# Patient Record
Sex: Female | Born: 1940 | Race: White | Hispanic: No | Smoking: Current every day smoker
Health system: Southern US, Community
[De-identification: ages and names within clinical notes are randomized; demographics above are authoritative.]

## PROBLEM LIST (undated history)

## (undated) DIAGNOSIS — F17200 Nicotine dependence, unspecified, uncomplicated: Secondary | ICD-10-CM

## (undated) DIAGNOSIS — R112 Nausea with vomiting, unspecified: Secondary | ICD-10-CM

## (undated) DIAGNOSIS — Z87442 Personal history of urinary calculi: Secondary | ICD-10-CM

## (undated) DIAGNOSIS — I779 Disorder of arteries and arterioles, unspecified: Secondary | ICD-10-CM

## (undated) DIAGNOSIS — R490 Dysphonia: Secondary | ICD-10-CM

## (undated) DIAGNOSIS — I739 Peripheral vascular disease, unspecified: Secondary | ICD-10-CM

## (undated) DIAGNOSIS — IMO0002 Reserved for concepts with insufficient information to code with codable children: Secondary | ICD-10-CM

## (undated) DIAGNOSIS — J449 Chronic obstructive pulmonary disease, unspecified: Secondary | ICD-10-CM

## (undated) DIAGNOSIS — M8580 Other specified disorders of bone density and structure, unspecified site: Secondary | ICD-10-CM

## (undated) DIAGNOSIS — K859 Acute pancreatitis without necrosis or infection, unspecified: Secondary | ICD-10-CM

## (undated) DIAGNOSIS — R32 Unspecified urinary incontinence: Secondary | ICD-10-CM

## (undated) DIAGNOSIS — R079 Chest pain, unspecified: Secondary | ICD-10-CM

## (undated) DIAGNOSIS — R109 Unspecified abdominal pain: Secondary | ICD-10-CM

## (undated) DIAGNOSIS — I10 Essential (primary) hypertension: Secondary | ICD-10-CM

## (undated) DIAGNOSIS — F32A Depression, unspecified: Secondary | ICD-10-CM

## (undated) DIAGNOSIS — J309 Allergic rhinitis, unspecified: Secondary | ICD-10-CM

## (undated) DIAGNOSIS — E7800 Pure hypercholesterolemia, unspecified: Secondary | ICD-10-CM

## (undated) DIAGNOSIS — K219 Gastro-esophageal reflux disease without esophagitis: Secondary | ICD-10-CM

## (undated) DIAGNOSIS — M1990 Unspecified osteoarthritis, unspecified site: Secondary | ICD-10-CM

## (undated) DIAGNOSIS — Z9889 Other specified postprocedural states: Secondary | ICD-10-CM

## (undated) DIAGNOSIS — I2510 Atherosclerotic heart disease of native coronary artery without angina pectoris: Secondary | ICD-10-CM

## (undated) DIAGNOSIS — G709 Myoneural disorder, unspecified: Secondary | ICD-10-CM

## (undated) DIAGNOSIS — R011 Cardiac murmur, unspecified: Secondary | ICD-10-CM

## (undated) DIAGNOSIS — H269 Unspecified cataract: Secondary | ICD-10-CM

## (undated) DIAGNOSIS — R0600 Dyspnea, unspecified: Secondary | ICD-10-CM

## (undated) DIAGNOSIS — E559 Vitamin D deficiency, unspecified: Secondary | ICD-10-CM

## (undated) DIAGNOSIS — F320 Major depressive disorder, single episode, mild: Secondary | ICD-10-CM

## (undated) HISTORY — DX: Other specified disorders of bone density and structure, unspecified site: M85.80

## (undated) HISTORY — DX: Acute pancreatitis without necrosis or infection, unspecified: K85.90

## (undated) HISTORY — PX: TONSILLECTOMY: SUR1361

## (undated) HISTORY — PX: OTHER SURGICAL HISTORY: SHX169

## (undated) HISTORY — PX: LIPOSUCTION: SHX10

## (undated) HISTORY — PX: SHOULDER SURGERY: SHX246

## (undated) HISTORY — DX: Unspecified cataract: H26.9

## (undated) HISTORY — DX: Vitamin D deficiency, unspecified: E55.9

## (undated) HISTORY — PX: BREAST BIOPSY: SHX20

## (undated) HISTORY — DX: Dysphonia: R49.0

## (undated) HISTORY — DX: Personal history of urinary calculi: Z87.442

## (undated) HISTORY — DX: Chronic obstructive pulmonary disease, unspecified: J44.9

## (undated) HISTORY — PX: TUBAL LIGATION: SHX77

## (undated) HISTORY — DX: Reserved for concepts with insufficient information to code with codable children: IMO0002

## (undated) HISTORY — DX: Essential (primary) hypertension: I10

## (undated) HISTORY — PX: COLON SURGERY: SHX602

## (undated) HISTORY — DX: Major depressive disorder, single episode, mild: F32.0

## (undated) HISTORY — PX: APPENDECTOMY: SHX54

## (undated) HISTORY — DX: Disorder of arteries and arterioles, unspecified: I77.9

## (undated) HISTORY — DX: Unspecified osteoarthritis, unspecified site: M19.90

## (undated) HISTORY — DX: Gastro-esophageal reflux disease without esophagitis: K21.9

## (undated) HISTORY — DX: Nicotine dependence, unspecified, uncomplicated: F17.200

## (undated) HISTORY — DX: Peripheral vascular disease, unspecified: I73.9

## (undated) HISTORY — DX: Pure hypercholesterolemia, unspecified: E78.00

## (undated) HISTORY — DX: Allergic rhinitis, unspecified: J30.9

## (undated) HISTORY — PX: DILATION AND CURETTAGE OF UTERUS: SHX78

## (undated) HISTORY — DX: Unspecified urinary incontinence: R32

## (undated) HISTORY — DX: Chest pain, unspecified: R07.9

## (undated) HISTORY — DX: Depression, unspecified: F32.A

## (undated) HISTORY — DX: Unspecified abdominal pain: R10.9

## (undated) HISTORY — PX: CATARACT EXTRACTION, BILATERAL: SHX1313

## (undated) HISTORY — PX: COSMETIC SURGERY: SHX468

## (undated) HISTORY — PX: EYE SURGERY: SHX253

## (undated) HISTORY — PX: FRACTURE SURGERY: SHX138

---

## 1998-10-22 ENCOUNTER — Ambulatory Visit (HOSPITAL_BASED_OUTPATIENT_CLINIC_OR_DEPARTMENT_OTHER): Admission: RE | Admit: 1998-10-22 | Discharge: 1998-10-22 | Payer: Self-pay | Admitting: Orthopedic Surgery

## 1999-01-23 ENCOUNTER — Other Ambulatory Visit: Admission: RE | Admit: 1999-01-23 | Discharge: 1999-01-23 | Payer: Self-pay | Admitting: Obstetrics and Gynecology

## 1999-03-25 ENCOUNTER — Ambulatory Visit (HOSPITAL_BASED_OUTPATIENT_CLINIC_OR_DEPARTMENT_OTHER): Admission: RE | Admit: 1999-03-25 | Discharge: 1999-03-25 | Payer: Self-pay | Admitting: General Surgery

## 2000-02-05 ENCOUNTER — Other Ambulatory Visit: Admission: RE | Admit: 2000-02-05 | Discharge: 2000-02-05 | Payer: Self-pay | Admitting: Obstetrics and Gynecology

## 2000-04-30 ENCOUNTER — Ambulatory Visit (HOSPITAL_BASED_OUTPATIENT_CLINIC_OR_DEPARTMENT_OTHER): Admission: RE | Admit: 2000-04-30 | Discharge: 2000-04-30 | Payer: Self-pay | Admitting: Orthopedic Surgery

## 2000-10-09 ENCOUNTER — Encounter: Payer: Self-pay | Admitting: Internal Medicine

## 2000-10-09 ENCOUNTER — Inpatient Hospital Stay (HOSPITAL_COMMUNITY): Admission: EM | Admit: 2000-10-09 | Discharge: 2000-10-14 | Payer: Self-pay | Admitting: Emergency Medicine

## 2001-02-09 ENCOUNTER — Other Ambulatory Visit: Admission: RE | Admit: 2001-02-09 | Discharge: 2001-02-09 | Payer: Self-pay | Admitting: Obstetrics and Gynecology

## 2001-03-30 ENCOUNTER — Ambulatory Visit (HOSPITAL_BASED_OUTPATIENT_CLINIC_OR_DEPARTMENT_OTHER): Admission: RE | Admit: 2001-03-30 | Discharge: 2001-03-30 | Payer: Self-pay | Admitting: Orthopedic Surgery

## 2001-10-05 ENCOUNTER — Ambulatory Visit (HOSPITAL_COMMUNITY): Admission: RE | Admit: 2001-10-05 | Discharge: 2001-10-05 | Payer: Self-pay | Admitting: Internal Medicine

## 2002-03-29 ENCOUNTER — Other Ambulatory Visit: Admission: RE | Admit: 2002-03-29 | Discharge: 2002-03-29 | Payer: Self-pay | Admitting: Internal Medicine

## 2002-11-03 DIAGNOSIS — R079 Chest pain, unspecified: Secondary | ICD-10-CM

## 2002-11-03 HISTORY — DX: Chest pain, unspecified: R07.9

## 2002-12-15 ENCOUNTER — Ambulatory Visit (HOSPITAL_COMMUNITY): Admission: RE | Admit: 2002-12-15 | Discharge: 2002-12-15 | Payer: Self-pay | Admitting: Internal Medicine

## 2005-01-23 ENCOUNTER — Ambulatory Visit (HOSPITAL_COMMUNITY): Admission: RE | Admit: 2005-01-23 | Discharge: 2005-01-23 | Payer: Self-pay | Admitting: Allergy

## 2005-02-06 ENCOUNTER — Other Ambulatory Visit: Admission: RE | Admit: 2005-02-06 | Discharge: 2005-02-06 | Payer: Self-pay | Admitting: Internal Medicine

## 2006-02-16 ENCOUNTER — Other Ambulatory Visit: Admission: RE | Admit: 2006-02-16 | Discharge: 2006-02-16 | Payer: Self-pay | Admitting: Internal Medicine

## 2007-12-05 HISTORY — PX: FEMUR SURGERY: SHX943

## 2009-03-01 ENCOUNTER — Other Ambulatory Visit: Admission: RE | Admit: 2009-03-01 | Discharge: 2009-03-01 | Payer: Self-pay | Admitting: Internal Medicine

## 2009-03-19 ENCOUNTER — Ambulatory Visit: Payer: Self-pay | Admitting: Vascular Surgery

## 2009-03-19 ENCOUNTER — Ambulatory Visit (HOSPITAL_COMMUNITY): Admission: RE | Admit: 2009-03-19 | Discharge: 2009-03-19 | Payer: Self-pay | Admitting: Internal Medicine

## 2009-03-19 ENCOUNTER — Encounter (INDEPENDENT_AMBULATORY_CARE_PROVIDER_SITE_OTHER): Payer: Self-pay | Admitting: Internal Medicine

## 2011-03-21 NOTE — Op Note (Signed)
[place]  Patient:    Elizabeth [name], Elizabeth [name]                         MRN: [id number] Proc. Date: 04/30/00 Adm. Date:  [id number] Disc. Date: [id number] Attending:  Ronne Binning                           Operative Report  PREOPERATIVE DIAGNOSIS:  Recurrent cyst of the right wrist.  POSTOPERATIVE DIAGNOSIS:  Recurrent cyst of the right wrist.  OPERATION:  Excision of cyst of the right wrist.  SURGEON:  Nicki Reaper, M.D.  ANESTHESIA:  Axillary block.  ANESTHESIOLOGIST:  Smith.  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  PROCEDURE:  The patient was brought to the operating room where an axillary block was carried out without difficulty.  She was prepped and draped using Betadine scrubbing solution with the right arm free.  The old incision was used and carried proximally and distally.  It was carried down through the subcutaneous tissue.  Bleeders were electrocauterized.  The radial artery was identified. The cyst was immediately encountered and with blunt and sharp dissection this was dissected free.  A small portion was left adherent to the artery. The remainder was sent to pathology.  The stalk was then followed into the radial carpal joint.  The area was opened, debrided, irrigated and the capsule closed with figure-of-eight 4-0 Vicryl sutures.  The subcutaneous tissue was closed with interrupted 4-0 Vicryl and the skin with interrupted 5-0 Nylon sutures.  A sterile compressive dressing and splint was applied. The patient tolerated the procedure well and was taken to the recovery room for observation in satisfactory condition.  She is discharged home to return to the office in [place] in 1 week on Vicodin and Keflex. DD:  04/30/00 TD:  05/01/00 Job: [id number]

## 2011-04-19 ENCOUNTER — Emergency Department (HOSPITAL_COMMUNITY): Payer: Medicare Other

## 2011-04-19 ENCOUNTER — Inpatient Hospital Stay (HOSPITAL_COMMUNITY)
Admission: EM | Admit: 2011-04-19 | Discharge: 2011-04-23 | DRG: 493 | Disposition: A | Discharge status: Rehabilitation Facility | Payer: Medicare Other | Attending: Orthopedic Surgery | Admitting: Orthopedic Surgery

## 2011-04-19 DIAGNOSIS — Z7901 Long term (current) use of anticoagulants: Secondary | ICD-10-CM

## 2011-04-19 DIAGNOSIS — S82899A Other fracture of unspecified lower leg, initial encounter for closed fracture: Principal | ICD-10-CM | POA: Diagnosis present

## 2011-04-19 DIAGNOSIS — W11XXXA Fall on and from ladder, initial encounter: Secondary | ICD-10-CM | POA: Diagnosis present

## 2011-04-19 DIAGNOSIS — I10 Essential (primary) hypertension: Secondary | ICD-10-CM | POA: Diagnosis present

## 2011-04-19 DIAGNOSIS — F17200 Nicotine dependence, unspecified, uncomplicated: Secondary | ICD-10-CM | POA: Diagnosis present

## 2011-04-19 DIAGNOSIS — S52599A Other fractures of lower end of unspecified radius, initial encounter for closed fracture: Secondary | ICD-10-CM | POA: Diagnosis present

## 2011-04-19 DIAGNOSIS — K219 Gastro-esophageal reflux disease without esophagitis: Secondary | ICD-10-CM | POA: Diagnosis present

## 2011-04-19 DIAGNOSIS — M899 Disorder of bone, unspecified: Secondary | ICD-10-CM | POA: Diagnosis present

## 2011-04-19 LAB — POCT I-STAT, CHEM 8
BUN: 22 mg/dL (ref 6–23)
Calcium, Ion: 1.18 mmol/L (ref 1.12–1.32)
Chloride: 101 meq/L (ref 96–112)
Creatinine, Ser: 1.2 mg/dL — ABNORMAL HIGH (ref 0.50–1.10)
Glucose, Bld: 99 mg/dL (ref 70–99)
HCT: 37 % (ref 36.0–46.0)
Hemoglobin: 12.6 g/dL (ref 12.0–15.0)
Potassium: 3.2 meq/L — ABNORMAL LOW (ref 3.5–5.1)
Sodium: 136 meq/L (ref 135–145)
TCO2: 25 mmol/L (ref 0–100)

## 2011-04-20 ENCOUNTER — Emergency Department (HOSPITAL_COMMUNITY): Payer: Medicare Other

## 2011-04-20 LAB — CBC
HCT: 32.4 % — ABNORMAL LOW (ref 36.0–46.0)
Hemoglobin: 10.9 g/dL — ABNORMAL LOW (ref 12.0–15.0)
MCH: 31.1 pg (ref 26.0–34.0)
MCHC: 33.6 g/dL (ref 30.0–36.0)
MCV: 92.3 fL (ref 78.0–100.0)
Platelets: 161 10*3/uL (ref 150–400)
RBC: 3.51 MIL/uL — ABNORMAL LOW (ref 3.87–5.11)
RDW: 12.8 % (ref 11.5–15.5)
WBC: 12.7 10*3/uL — ABNORMAL HIGH (ref 4.0–10.5)

## 2011-04-20 LAB — PROTIME-INR
INR: 1 (ref 0.00–1.49)
Prothrombin Time: 13.4 seconds (ref 11.6–15.2)

## 2011-04-20 LAB — BASIC METABOLIC PANEL
BUN: 15 mg/dL (ref 6–23)
CO2: 27 mEq/L (ref 19–32)
Calcium: 8.3 mg/dL — ABNORMAL LOW (ref 8.4–10.5)
Chloride: 103 mEq/L (ref 96–112)
Creatinine, Ser: 0.78 mg/dL (ref 0.50–1.10)
GFR calc Af Amer: >60 mL/min (ref >60)
GFR calc non Af Amer: >60 mL/min (ref >60)
Glucose, Bld: 120 mg/dL — ABNORMAL HIGH (ref 70–99)
Potassium: 3.2 mEq/L — ABNORMAL LOW (ref 3.5–5.1)
Sodium: 139 mEq/L (ref 135–145)

## 2011-04-21 LAB — BASIC METABOLIC PANEL
BUN: 6 mg/dL (ref 6–23)
CO2: 28 mEq/L (ref 19–32)
Calcium: 8.2 mg/dL — ABNORMAL LOW (ref 8.4–10.5)
Chloride: 103 mEq/L (ref 96–112)
Creatinine, Ser: 0.56 mg/dL (ref 0.50–1.10)
GFR calc Af Amer: >60 mL/min (ref >60)
GFR calc non Af Amer: >60 mL/min (ref >60)
Glucose, Bld: 131 mg/dL — ABNORMAL HIGH (ref 70–99)
Potassium: 3.5 mEq/L (ref 3.5–5.1)
Sodium: 138 mEq/L (ref 135–145)

## 2011-04-21 LAB — CBC
HCT: 32 % — ABNORMAL LOW (ref 36.0–46.0)
Hemoglobin: 10.8 g/dL — ABNORMAL LOW (ref 12.0–15.0)
MCH: 31.2 pg (ref 26.0–34.0)
MCHC: 33.8 g/dL (ref 30.0–36.0)
MCV: 92.5 fL (ref 78.0–100.0)
Platelets: 137 10*3/uL — ABNORMAL LOW (ref 150–400)
RBC: 3.46 MIL/uL — ABNORMAL LOW (ref 3.87–5.11)
RDW: 12.7 % (ref 11.5–15.5)
WBC: 11.6 10*3/uL — ABNORMAL HIGH (ref 4.0–10.5)

## 2011-04-21 LAB — PROTIME-INR
INR: 1.23 (ref 0.00–1.49)
Prothrombin Time: 15.8 seconds — ABNORMAL HIGH (ref 11.6–15.2)

## 2011-04-21 NOTE — Op Note (Signed)
Elizabeth [name], Elizabeth [name] NO.:  [id number]  MEDICAL RECORD NO.:  [id number]  LOCATION:  5010                         FACILITY:  [facility]  PHYSICIAN:  Eulas Post, MD    DATE OF BIRTH:  21-Jun-1941  DATE OF PROCEDURE:  04/19/2011 DATE OF DISCHARGE:                              OPERATIVE REPORT   ATTENDING SURGEON:  Eulas Post, MD  FIRST ASSISTANT:  None.  PREOPERATIVE DIAGNOSIS:  Left distal radius fracture and left tibia fracture.  POSTOPERATIVE DIAGNOSIS:  Left distal radius fracture and left tibia fracture.  OPERATIVE PROCEDURE:  Open reduction and internal fixation of left distal radius fracture with volar plating, 2 pieces, and intramedullary nailing of left tibia.  OPERATIVE IMPLANTS:  DVR volar plate for the distal radius with the smooth locking pegs in the distal holes, with a narrow head, short plate, and a size 10 DePuy tibial nail with 2 interlocking screws proximally locked static and 2 distal interlocking screws.  PREOPERATIVE INDICATIONS:  Ms. Elizabeth [name] is a 70 year old woman who has had a previous history of a proximal humerus fracture and ankle fracture as well as a hip fracture before with some osteopenia who fell off a ladder approximately 6-7 feet.  She had the above-named injuries.  She elected for plate fixation of the distal radius and intramedullary nail fixation of the tibia.  The risks, benefits, and alternatives were discussed with her before the procedure including but not limited to nonoperative management options, versus surgical intervention, with the risks of infection, bleeding, nerve injury, malunion, nonunion, hardware prominence, hardware failure, need for hardware removal, tendon rupture, median nerve injury, regional pain syndrome, stiffness, loss of function, hardware prominence, the need for hardware removal, cardiopulmonary complications, among others and she is willing to proceed.  OPERATIVE PROCEDURE:  The  patient was brought to the operating room and placed in supine position.  IV antibiotics were given.  General anesthesia was administered.  I started with the distal radius.  The left upper extremity was prepped and draped in the usual sterile fashion.  The arm was elevated and exsanguinated and the tourniquet was inflated.  Total tourniquet time was 1 hour and 15 minutes.  Volar approach to the distal radius was performed.  The flexor carpi radialis was retracted and the radial artery was protected as was the median nerve.  Once I had exposed the fracture site, I reduced it anatomically and held provisionally and placed a K-wire.  Once the K-wire was in place and I confirmed reduction, I then applied the plate and secured it initially with K-wires and then once I was satisfied with position of the plate I then completed the fixation with a proximal screw.  The proximal screws were all left shy of extending through the other cortex in order to minimize tendon irritation. Additionally, all of the smooth pegs were left unicortical and on the fracture side.  I had excellent fixation in the distal segment and there were no screws penetrating into the joint.  Once all the hardware was in and I was satisfied that I had appropriately reduced inclination, height, tilt, as well as the sigmoid notch, I then  irrigated the wounds copiously.  The pronator quadratus was fairly poor quality, and so I did not get a very good repair on that.  After I irrigated it copiously, I repaired the subcutaneous tissue with Vicryl followed by Monocryl and Steri-Strips for the skin.  I dressed with sterile gauze and then I turned my attention to the tibia.  A completely separate sterile prep and drape was performed for the tibia.  A medial parapatellar incision was made and I dissected down through the capsule and then introduced a guide pin.  I was actually having fairly difficult time getting lateral enough, and  the guide pin kept trying to move medially, and so ultimately I ended up using an awl, with little better control and then opened the proximal tibia.  I confirmed on AP and lateral views the appropriate opening and then introduced a guide pin down.  I reduced the fracture anatomically and confirmed it on AP and lateral views.  Once I had the guide pin across, I then sequentially reamed up to an 11.5 for the 10 nail.  This was an appropriate size.  Overall bone quality was fairly weak, so I did not want to [facility] or potentially provide any the cortices.  I had reasonable chatter.  I measured the appropriate length of the nail and then placed the nail without difficulty.  Anatomic reduction was achieved and I had confirmed rotational alignment as well clinically. The fracture actually keyed in quite nicely.  I then performed a single static interlock proximally and a second oblique interlock proximally.  The oblique screw was probably not completely bicortical.  I stopped short as it was right near the fibular head.  I did perfect circles and interlocked the distal tibia with 2 screws. This provided excellent fixation.  Confirmation was made on AP and lateral views of all the hardware as well as the reduction and positioning.  I then irrigated all the wounds copiously and closed the parapatellar tissue with #1 Vicryl followed by 3-0 for subcutaneous tissue and Monocryl with Steri-Strips and sterile gauze for the skin.  She was placed into a posterior splint on both her leg as well as a volar splint on her wrist.  Tourniquet time on the tibia was 1 hour and 45 minutes. She tolerated the procedure well.  There were no complications.     Eulas Post, MD     JPL/MEDQ  D:  04/20/2011  T:  04/20/2011  Job:  [id number]  Electronically Signed by Teryl Lucy MD on 04/21/2011 05:03:03 PM

## 2011-04-21 NOTE — H&P (Signed)
NAMEISOLA, MEHLMAN NO.:  [id number]  MEDICAL RECORD NO.:  [id number]  LOCATION:  [facility]                         FACILITY:  [facility]  PHYSICIAN:  Eulas Post, MD    DATE OF BIRTH:  Apr 12, 1941  DATE OF ADMISSION:  04/19/2011 DATE OF DISCHARGE:                             HISTORY & PHYSICAL   CHIEF COMPLAINT:  Fall.  HISTORY:  Ms. Minerva Bluett is a 70 year old woman who is right-hand dominant who fell 7 feet off a ladder today while she was trying to trim some trees and had acute onset severe left wrist and left leg pain.  The pain is rated as moderate to severe, and is located directly over the wrist and the leg.  She was unable to walk.  She was brought into the emergency room for evaluation.  Moving it makes it worse, and resting it makes it better.  She describes it as a dull pain.  She did not have any loss of consciousness.  This was a mechanical fall.  PAST HISTORY:  Significant for a fall a couple of years ago which resulted in a left hip fracture and also a left shoulder fracture.  She apparently had borderline osteopenia.  She also has hypertension.  FAMILY HISTORY:  Her father died in World War II and her mother died with cardiac disease, which runs in her family.  SOCIAL HISTORY:  She says that she occasionally smokes depending on the stress level, and she lives in [place] with a house here in [place] as well.  REVIEW OF SYSTEMS:  Complete review of systems was performed and was otherwise negative with the exception of those mentioned in the history of present illness.  PHYSICAL EXAMINATION:  CONSTITUTIONAL:  She is alert and oriented x3 and appropriate with me throughout my interaction and is well developed, well nourished in no acute distress. EYE:  Extraocular movements are intact. LYMPHATIC:  She has no axillary or cervical lymphadenopathy. CARDIOVASCULAR:  She has no pedal edema. GI:  Her abdomen is soft and nontender without any  rebound or guarding or organomegaly. PSYCHIATRIC:  She is appropriate for consent and is interacting appropriately. SKIN:  She has bruising both over her left wrist and her left leg, but no skin breaks and no evidence for open fracture. NEUROLOGIC:  Sensation is intact throughout the left hand, particularly in the median nerve there are no deficits.  The sensation is also intact throughout the left leg.  All fingers flex, extend and abduct although she does this weakly due to pain on the left side.  Her EHL and FHL are firing on the left side as well. MUSCULOSKELETAL:  She has point tenderness as well as gross deformity of the left wrist.  The elbow and shoulder and right upper extremity and right lower extremity are atraumatic.  Her left lower extremity has pain to palpation with mild deformity over the tibia.  She has a well-healed surgical scar over her left hip.  Her compartments are soft.  IMAGING:  X-rays demonstrate a grossly displaced left distal radius fracture and also a mildly displaced comminuted distal third tibia fracture.  IMPRESSION:  Acute left  distal third tibia fracture, and left displaced distal radius fracture.  Please also add that there is evidence for previous distal fibula fracture which appears to have healed with some possible slight malunion.  PLAN:  This is an acute severe injury, and is going to have significant impact on her ambulatory function.  She is a high-risk both for morbidity as well as potentially for mortality, given the constellation of injuries, as well as her age.  I have discussed the risks, benefits and alternatives to surgical versus nonsurgical intervention for both the wrist as well as the tibia, and given her age and desired activity level, I think that closed management of either of these fractures would be significantly impairing, and lead to a lesser result.  Therefore in order to accelerate mobilization, and optimize return  to function, I have recommended open reduction and internal fixation of left distal radius as well as intramedullary nailing of the left tibia.  The risks, benefits and alternatives were discussed at length and we are going to plan to proceed accordingly, probably later this evening.     Eulas Post, MD     JPL/MEDQ  D:  04/19/2011  T:  04/20/2011  Job:  [id number]  Electronically Signed by Teryl Lucy MD on 04/21/2011 05:03:01 PM

## 2011-04-22 LAB — PROTIME-INR
INR: 1.61 — ABNORMAL HIGH (ref 0.00–1.49)
Prothrombin Time: 19.4 seconds — ABNORMAL HIGH (ref 11.6–15.2)

## 2011-04-23 ENCOUNTER — Inpatient Hospital Stay (HOSPITAL_COMMUNITY)
Admission: RE | Admit: 2011-04-23 | Discharge: 2011-04-28 | DRG: 945 | Disposition: A | Discharge status: Home Health | Payer: Medicare Other | Source: Other Acute Inpatient Hospital | Attending: Physical Medicine & Rehabilitation | Admitting: Physical Medicine & Rehabilitation

## 2011-04-23 DIAGNOSIS — S52509A Unspecified fracture of the lower end of unspecified radius, initial encounter for closed fracture: Secondary | ICD-10-CM

## 2011-04-23 DIAGNOSIS — E785 Hyperlipidemia, unspecified: Secondary | ICD-10-CM | POA: Diagnosis present

## 2011-04-23 DIAGNOSIS — Z5189 Encounter for other specified aftercare: Principal | ICD-10-CM

## 2011-04-23 DIAGNOSIS — D62 Acute posthemorrhagic anemia: Secondary | ICD-10-CM

## 2011-04-23 DIAGNOSIS — S52599A Other fractures of lower end of unspecified radius, initial encounter for closed fracture: Secondary | ICD-10-CM | POA: Diagnosis present

## 2011-04-23 DIAGNOSIS — K5900 Constipation, unspecified: Secondary | ICD-10-CM | POA: Diagnosis not present

## 2011-04-23 DIAGNOSIS — S82899A Other fracture of unspecified lower leg, initial encounter for closed fracture: Secondary | ICD-10-CM | POA: Diagnosis present

## 2011-04-23 DIAGNOSIS — K219 Gastro-esophageal reflux disease without esophagitis: Secondary | ICD-10-CM | POA: Diagnosis present

## 2011-04-23 DIAGNOSIS — S82209A Unspecified fracture of shaft of unspecified tibia, initial encounter for closed fracture: Secondary | ICD-10-CM

## 2011-04-23 DIAGNOSIS — W11XXXA Fall on and from ladder, initial encounter: Secondary | ICD-10-CM | POA: Diagnosis present

## 2011-04-23 DIAGNOSIS — I10 Essential (primary) hypertension: Secondary | ICD-10-CM | POA: Diagnosis present

## 2011-04-23 DIAGNOSIS — S52609A Unspecified fracture of lower end of unspecified ulna, initial encounter for closed fracture: Secondary | ICD-10-CM

## 2011-04-23 LAB — PROTIME-INR
INR: 1.85 — ABNORMAL HIGH (ref 0.00–1.49)
Prothrombin Time: 21.7 seconds — ABNORMAL HIGH (ref 11.6–15.2)

## 2011-04-24 DIAGNOSIS — Z5189 Encounter for other specified aftercare: Secondary | ICD-10-CM

## 2011-04-24 DIAGNOSIS — S52609A Unspecified fracture of lower end of unspecified ulna, initial encounter for closed fracture: Secondary | ICD-10-CM

## 2011-04-24 DIAGNOSIS — D62 Acute posthemorrhagic anemia: Secondary | ICD-10-CM

## 2011-04-24 DIAGNOSIS — S82209A Unspecified fracture of shaft of unspecified tibia, initial encounter for closed fracture: Secondary | ICD-10-CM

## 2011-04-24 DIAGNOSIS — S52509A Unspecified fracture of the lower end of unspecified radius, initial encounter for closed fracture: Secondary | ICD-10-CM

## 2011-04-24 LAB — CBC
HCT: 29.3 % — ABNORMAL LOW (ref 36.0–46.0)
Hemoglobin: 10 g/dL — ABNORMAL LOW (ref 12.0–15.0)
MCH: 31.1 pg (ref 26.0–34.0)
MCHC: 34.1 g/dL (ref 30.0–36.0)
MCV: 91 fL (ref 78.0–100.0)
Platelets: 208 10*3/uL (ref 150–400)
RBC: 3.22 MIL/uL — ABNORMAL LOW (ref 3.87–5.11)
RDW: 12.5 % (ref 11.5–15.5)
WBC: 6.3 10*3/uL (ref 4.0–10.5)

## 2011-04-24 LAB — DIFFERENTIAL
Basophils Absolute: 0 10*3/uL (ref 0.0–0.1)
Basophils Relative: 0 % (ref 0–1)
Eosinophils Absolute: 0 10*3/uL (ref 0.0–0.7)
Eosinophils Relative: 0 % (ref 0–5)
Lymphocytes Relative: 24 % (ref 12–46)
Lymphs Abs: 1.5 10*3/uL (ref 0.7–4.0)
Monocytes Absolute: 0.3 10*3/uL (ref 0.1–1.0)
Monocytes Relative: 5 % (ref 3–12)
Neutro Abs: 4.5 10*3/uL (ref 1.7–7.7)
Neutrophils Relative %: 71 % (ref 43–77)

## 2011-04-24 LAB — COMPREHENSIVE METABOLIC PANEL
ALT: 12 U/L (ref 0–35)
AST: 21 U/L (ref 0–37)
Albumin: 2.6 g/dL — ABNORMAL LOW (ref 3.5–5.2)
Alkaline Phosphatase: 64 U/L (ref 39–117)
BUN: 15 mg/dL (ref 6–23)
CO2: 27 mEq/L (ref 19–32)
Calcium: 9 mg/dL (ref 8.4–10.5)
Chloride: 101 mEq/L (ref 96–112)
Creatinine, Ser: 0.63 mg/dL (ref 0.50–1.10)
GFR calc Af Amer: >60 mL/min (ref >60)
GFR calc non Af Amer: >60 mL/min (ref >60)
Glucose, Bld: 120 mg/dL — ABNORMAL HIGH (ref 70–99)
Potassium: 3.7 mEq/L (ref 3.5–5.1)
Sodium: 140 mEq/L (ref 135–145)
Total Bilirubin: 0.5 mg/dL (ref 0.3–1.2)
Total Protein: 6.5 g/dL (ref 6.0–8.3)

## 2011-04-24 LAB — PROTIME-INR
INR: 2.15 — ABNORMAL HIGH (ref 0.00–1.49)
Prothrombin Time: 24.4 seconds — ABNORMAL HIGH (ref 11.6–15.2)

## 2011-04-25 DIAGNOSIS — S52609A Unspecified fracture of lower end of unspecified ulna, initial encounter for closed fracture: Secondary | ICD-10-CM

## 2011-04-25 DIAGNOSIS — D62 Acute posthemorrhagic anemia: Secondary | ICD-10-CM

## 2011-04-25 DIAGNOSIS — S52509A Unspecified fracture of the lower end of unspecified radius, initial encounter for closed fracture: Secondary | ICD-10-CM

## 2011-04-25 DIAGNOSIS — S82209A Unspecified fracture of shaft of unspecified tibia, initial encounter for closed fracture: Secondary | ICD-10-CM

## 2011-04-25 DIAGNOSIS — Z5189 Encounter for other specified aftercare: Secondary | ICD-10-CM

## 2011-04-25 LAB — PROTIME-INR
INR: 2.44 — ABNORMAL HIGH (ref 0.00–1.49)
Prothrombin Time: 26.9 seconds — ABNORMAL HIGH (ref 11.6–15.2)

## 2011-04-26 LAB — PROTIME-INR
INR: 3.18 — ABNORMAL HIGH (ref 0.00–1.49)
Prothrombin Time: 33.1 seconds — ABNORMAL HIGH (ref 11.6–15.2)

## 2011-04-27 LAB — PROTIME-INR
INR: 2.1 — ABNORMAL HIGH (ref 0.00–1.49)
Prothrombin Time: 23.9 seconds — ABNORMAL HIGH (ref 11.6–15.2)

## 2011-04-27 NOTE — Discharge Summary (Signed)
  [name], [name] NO.:  [id number]  MEDICAL RECORD NO.:  [id number]  LOCATION:  5010                         FACILITY:  [facility]  PHYSICIAN:  Eulas Post, MD    DATE OF BIRTH:  1941/02/10  DATE OF ADMISSION:  04/19/2011 DATE OF DISCHARGE:                        DISCHARGE SUMMARY - REFERRING   DATE OF ANTICIPATED TRANSFER:  April 22, 2011.  ADMISSION DIAGNOSIS:  Left tibia fracture and left distal radius fracture.  DISCHARGE DIAGNOSIS:  Left tibia fracture and left distal radius fracture.  HOSPITAL COURSE:  Mrs. Elizabeth [name] is a 70 year old woman who fell approximately 7 feet and was a level 2 trauma activation and had a distal tibia fracture and a distal radius fracture and was admitted and underwent intramedullary nail fixation of the tibia and plating for the wrist.  She tolerated the procedure well.  Postoperatively, she denied having any complications.  She was given perioperative antibiotics for antimicrobial prophylaxis.  She was given sequential compression devices, Lovenox transitioning to Coumadin for DVT prophylaxis.  She will be on Coumadin for a period of 1 month from the date of surgery. She was given IV and subsequently p.o. analgesics for pain management. She was making slow progress with physical therapy and is planned to be discharged to skilled nursing for ongoing therapy.  Her sensation and motor were intact throughout her feet and hands throughout her hospital stay.  She had no evidence for compartment syndrome.  She is weightbearing as tolerated on her left lower extremity, and weightbearing on a platform crutch with her left upper extremity, but should not bear weight through the left wrist.  She will plan to follow up with me in 2 weeks for a follow-up appointment at my office, [phone].  She benefited maximally from her hospital stay.     Eulas Post, MD     JPL/MEDQ  D:  04/21/2011  T:  04/21/2011  Job:   [id number]  Electronically Signed by Teryl Lucy MD on 04/27/2011 09:53:30 PM

## 2011-04-28 LAB — PROTIME-INR
INR: 1.71 — ABNORMAL HIGH (ref 0.00–1.49)
Prothrombin Time: 20.4 seconds — ABNORMAL HIGH (ref 11.6–15.2)

## 2011-05-25 NOTE — H&P (Signed)
Elizabeth [name], NIEHAUS NO.:  [id number]  MEDICAL RECORD NO.:  [id number]  LOCATION:                                 FACILITY:  PHYSICIAN:  Elizabeth [name], M.D.DATE OF BIRTH:  1940/11/09  DATE OF ADMISSION:  04/23/2011 DATE OF DISCHARGE:                             HISTORY & PHYSICAL   PRIMARY CARE PHYSICIAN:  Elizabeth Dubonnet, MD  CHIEF COMPLAINT:  Left leg and arm pain.  HISTORY OF PRESENT ILLNESS:  This is a 70 year old white female admitted on April 19, 2011 after a fall off a ladder while pulling on IV.  She did not lose consciousness.  She sustained a left tibia fracture and left distal radius fracture.  She underwent IM nailing of left tibia and ORIF of left radius fracture on April 20, 2011 by Dr. Dion Saucier.  She is nonweightbearing left wrist and weightbearing as tolerated left lower extremity.  She is on Coumadin for DVT prophylaxis with Lovenox coverage to INR greater than 2.0.  She has had significant problems with pain as well as insomnia and decreased appetite.  I saw her at rehab consultation yesterday and felt she could benefit from intensive rehab program to return home.  REVIEW OF SYSTEMS:  Notable for reflux and those items mentioned above. Full 12-point review is in the written H and P.  PAST MEDICAL HISTORY:  Positive for hypertension, hyperlipidemia, GERD, history of left hip fracture years ago with care at [facility], left shoulder fracture after fall.  HABITS:  Denies alcohol or tobacco.  FAMILY HISTORY:  Positive for CAD.  SOCIAL HISTORY:  The patient is married and is still a resident of [place] [facility].  Husband can assist.  She will stay at her [facility] home until more mobile which is one level with one step to enter.  ALLERGIES:  TETRACYCLINE.  HOME MEDICATIONS:  Diovan, Vytorin, Singulair, multivitamin, aspirin, and Nexium.  LABORATORY DATA:  Hemoglobin 10.8, white count 11, and platelets 137. Sodium  138, potassium 3.5, BUN 6, and creatinine 0.5.  PHYSICAL EXAMINATION:  VITAL SIGNS:  Blood pressure is 113/65, pulse 78, respiratory rate 17, and temperature 97.8. GENERAL:  The patient is pleasant and alert. HEENT:  Pupils equally, round, and react to light.  Ears, nose and throat exam is notable for intact dentition.  Pink moist mucosa. NECK:  Supple without JVD or lymphadenopathy. CHEST:  Clear to auscultation bilaterally without wheezes, rales, or rhonchi. HEART:  Regular rate and rhythm without murmurs, rubs, or gallops. ABDOMEN:  Soft and nontender. SKIN:  Notable for incision, staples sites on the left arm and leg which were clean and intact.  These wounds were covered with dry dressing and Ace wrapping. NEUROLOGIC:  Cranial nerves II-XII grossly intact.  Reflexes 1+. Sensation is normal.  Judgment, orientation, memory, and mood are appropriate.  Strength in the right upper extremity is 4/5.  Left upper extremity, she is 3+-4/5 shoulder and elbow, and 3/5 hand intrinsics. Unable to fully stretch the hand and wrist because of her splint.  Left lower extremity is 2-3/5 proximally.  Did not test the ankle due to her splint location.  POST ADMISSION  PHYSICIAN EVALUATION: 1. Functional deficit secondary left tibia fracture status post IM     nailing and left distal radius fracture status post ORIF,     postoperative day #3 today. 2. The patient is admitted to receive collaborative interdisciplinary     care between the physiatrist, rehab nursing staff, and therapy     team. 3. The patient's level of medical complexity and substantial therapy     needs in context of that medical necessity cannot be provided at a     lesser intensity of care.  Medical necessity includes the patient's     hypertension as well as postoperative anemia, pain control, and     anticoagulation which appears moderate. 4. The patient has experienced substantial functional loss from her     baseline.   Premorbidly, she was independent and active.  Currently,     she is modified independent bed mobility, min assist transfer, min     assist gait only 15 feet with platform rolling walker limited by     pain and endurance and min to mod assist ADLs.  Judging by the     patient's diagnosis, physical exam, and functional history, she has     potential for functional progress which will result in measurable     gains while in inpatient rehab.  These gains will be of substantial     and practical use upon discharge to home in facilitating mobility     and self-care.  Interim changes in medical status since our     preadmission consult were detailed above. 5. The physiatrist will provide 24-hour management of medical needs as     well as oversight of the therapy plan/treatment and provide     guidance as appropriate regarding interaction of the two.  Medical     problem list and plan are below. 6. A 24-hour rehab nursing team will assist in management of the     patient's skin care needs as well as bowel and bladder function,     pain management, sleep habits, and integration of therapy concepts,     techniques, education, etc. 7. PT will assess and treat for lower extremity strength, range of     motion, adherence to weightbearing precautions, adaptive     techniques, equipment, safety, and family education with goals     modified independent to supervision. 8. OT will assess and treat for upper extremity use ADLs, adaptive     techniques, equipment, functional mobility, safety, weightbearing     precautions, and family/patient education with goals modified     independent to occasional set up/min assist. 9. Case Management and social worker will assess and treat for     psychosocial issues and discharge plan. 10.Team conference will be held weekly to assess progress towards     goals and to determine barriers at discharge. 11.The patient demonstrated sufficient medical stability and  exercise     capacity to tolerate at least 3 hours of therapy per day at least 5     days week. 12.Estimated length of stay is approximately +7 days.  Prognosis is     good.  MEDICAL PROBLEM LIST AND PLAN: 1. Anticoagulation with aspirin:  She will also be on subcu Lovenox to     Coumadin bridge protocol.  Follow INRs daily and regular CBCs     checks with Pharmacy assistance. 2. Pain management:  Robaxin and oxycodone IR.  The patient is     requiring q.3  h. oxycodone for breakthrough pain.  May need to     consider a long-acting agent or scheduling oxycodone prior to     activities with therapies. 3. Hypertension:  The patient is on hydrochlorothiazide and Benicar at     present.  We will monitor with pain levels as well as with     increased activity on rehab. 4. Hyperlipidemia:  Vytorin. 5. Reflux:  Nexium 40 mg daily.  This may be exacerbated by narcotics.     Observe for now. 6. Acute blood loss anemia:  Iron supplementation.  Check CBC on     admission.  No active signs of bleeding at present.     Elizabeth [name], M.D.     ZTS/MEDQ  D:  04/23/2011  T:  04/23/2011  Job:  [id number]  cc:   Elizabeth [name], M.D.  Electronically Signed by Faith Rogue M.D. on 05/25/2011 08:09:01 PM

## 2011-11-19 DIAGNOSIS — M6530 Trigger finger, unspecified finger: Secondary | ICD-10-CM | POA: Diagnosis not present

## 2011-11-19 DIAGNOSIS — M659 Synovitis and tenosynovitis, unspecified: Secondary | ICD-10-CM | POA: Diagnosis not present

## 2011-12-04 DIAGNOSIS — M6530 Trigger finger, unspecified finger: Secondary | ICD-10-CM | POA: Diagnosis not present

## 2011-12-04 DIAGNOSIS — M720 Palmar fascial fibromatosis [Dupuytren]: Secondary | ICD-10-CM | POA: Diagnosis not present

## 2011-12-06 DIAGNOSIS — J209 Acute bronchitis, unspecified: Secondary | ICD-10-CM | POA: Diagnosis not present

## 2011-12-12 DIAGNOSIS — J209 Acute bronchitis, unspecified: Secondary | ICD-10-CM | POA: Diagnosis not present

## 2011-12-12 DIAGNOSIS — I10 Essential (primary) hypertension: Secondary | ICD-10-CM | POA: Diagnosis not present

## 2011-12-12 DIAGNOSIS — E039 Hypothyroidism, unspecified: Secondary | ICD-10-CM | POA: Diagnosis not present

## 2011-12-12 DIAGNOSIS — R5381 Other malaise: Secondary | ICD-10-CM | POA: Diagnosis not present

## 2011-12-15 DIAGNOSIS — M659 Synovitis and tenosynovitis, unspecified: Secondary | ICD-10-CM | POA: Diagnosis not present

## 2011-12-15 DIAGNOSIS — M6530 Trigger finger, unspecified finger: Secondary | ICD-10-CM | POA: Diagnosis not present

## 2011-12-17 DIAGNOSIS — J209 Acute bronchitis, unspecified: Secondary | ICD-10-CM | POA: Diagnosis not present

## 2011-12-17 DIAGNOSIS — R5381 Other malaise: Secondary | ICD-10-CM | POA: Diagnosis not present

## 2012-01-14 ENCOUNTER — Other Ambulatory Visit (HOSPITAL_COMMUNITY)
Admission: RE | Admit: 2012-01-14 | Discharge: 2012-01-14 | Disposition: A | Discharge status: Home / Self Care | Payer: Medicare Other | Source: Ambulatory Visit | Attending: Internal Medicine | Admitting: Internal Medicine

## 2012-01-14 ENCOUNTER — Other Ambulatory Visit: Payer: Self-pay | Admitting: Internal Medicine

## 2012-01-14 DIAGNOSIS — Z124 Encounter for screening for malignant neoplasm of cervix: Secondary | ICD-10-CM | POA: Insufficient documentation

## 2012-01-14 DIAGNOSIS — J449 Chronic obstructive pulmonary disease, unspecified: Secondary | ICD-10-CM | POA: Diagnosis not present

## 2012-01-14 DIAGNOSIS — I10 Essential (primary) hypertension: Secondary | ICD-10-CM | POA: Diagnosis not present

## 2012-01-14 DIAGNOSIS — Z79899 Other long term (current) drug therapy: Secondary | ICD-10-CM | POA: Diagnosis not present

## 2012-01-14 DIAGNOSIS — E7800 Pure hypercholesterolemia, unspecified: Secondary | ICD-10-CM | POA: Diagnosis not present

## 2012-01-14 DIAGNOSIS — Z1159 Encounter for screening for other viral diseases: Secondary | ICD-10-CM | POA: Insufficient documentation

## 2012-01-14 DIAGNOSIS — E119 Type 2 diabetes mellitus without complications: Secondary | ICD-10-CM | POA: Diagnosis not present

## 2012-01-14 DIAGNOSIS — D649 Anemia, unspecified: Secondary | ICD-10-CM | POA: Diagnosis not present

## 2012-01-14 DIAGNOSIS — R011 Cardiac murmur, unspecified: Secondary | ICD-10-CM | POA: Diagnosis not present

## 2012-01-14 DIAGNOSIS — Z0000 Encounter for general adult medical examination without abnormal findings: Secondary | ICD-10-CM | POA: Diagnosis not present

## 2012-01-14 DIAGNOSIS — I509 Heart failure, unspecified: Secondary | ICD-10-CM | POA: Diagnosis not present

## 2012-01-15 DIAGNOSIS — I6529 Occlusion and stenosis of unspecified carotid artery: Secondary | ICD-10-CM | POA: Diagnosis not present

## 2012-01-15 DIAGNOSIS — R011 Cardiac murmur, unspecified: Secondary | ICD-10-CM | POA: Diagnosis not present

## 2012-01-15 DIAGNOSIS — I509 Heart failure, unspecified: Secondary | ICD-10-CM | POA: Diagnosis not present

## 2012-01-15 DIAGNOSIS — Z0000 Encounter for general adult medical examination without abnormal findings: Secondary | ICD-10-CM | POA: Diagnosis not present

## 2012-01-15 DIAGNOSIS — J449 Chronic obstructive pulmonary disease, unspecified: Secondary | ICD-10-CM | POA: Diagnosis not present

## 2012-01-15 DIAGNOSIS — M79609 Pain in unspecified limb: Secondary | ICD-10-CM | POA: Diagnosis not present

## 2012-01-15 DIAGNOSIS — D649 Anemia, unspecified: Secondary | ICD-10-CM | POA: Diagnosis not present

## 2012-01-15 DIAGNOSIS — Z79899 Other long term (current) drug therapy: Secondary | ICD-10-CM | POA: Diagnosis not present

## 2012-02-04 DIAGNOSIS — M6530 Trigger finger, unspecified finger: Secondary | ICD-10-CM | POA: Diagnosis not present

## 2012-02-17 DIAGNOSIS — L570 Actinic keratosis: Secondary | ICD-10-CM | POA: Diagnosis not present

## 2012-02-17 DIAGNOSIS — L821 Other seborrheic keratosis: Secondary | ICD-10-CM | POA: Diagnosis not present

## 2012-02-25 DIAGNOSIS — I10 Essential (primary) hypertension: Secondary | ICD-10-CM | POA: Diagnosis not present

## 2012-04-02 DIAGNOSIS — Z1231 Encounter for screening mammogram for malignant neoplasm of breast: Secondary | ICD-10-CM | POA: Diagnosis not present

## 2012-06-15 DIAGNOSIS — J0100 Acute maxillary sinusitis, unspecified: Secondary | ICD-10-CM | POA: Diagnosis not present

## 2012-06-15 DIAGNOSIS — IMO0002 Reserved for concepts with insufficient information to code with codable children: Secondary | ICD-10-CM | POA: Diagnosis not present

## 2012-07-20 DIAGNOSIS — J209 Acute bronchitis, unspecified: Secondary | ICD-10-CM | POA: Diagnosis not present

## 2012-07-20 DIAGNOSIS — M1990 Unspecified osteoarthritis, unspecified site: Secondary | ICD-10-CM | POA: Diagnosis not present

## 2012-07-20 DIAGNOSIS — R011 Cardiac murmur, unspecified: Secondary | ICD-10-CM | POA: Diagnosis not present

## 2012-07-20 DIAGNOSIS — I10 Essential (primary) hypertension: Secondary | ICD-10-CM | POA: Diagnosis not present

## 2012-07-20 DIAGNOSIS — M779 Enthesopathy, unspecified: Secondary | ICD-10-CM | POA: Diagnosis not present

## 2012-07-20 DIAGNOSIS — M79609 Pain in unspecified limb: Secondary | ICD-10-CM | POA: Diagnosis not present

## 2012-10-29 DIAGNOSIS — Z23 Encounter for immunization: Secondary | ICD-10-CM | POA: Diagnosis not present

## 2012-11-11 DIAGNOSIS — J309 Allergic rhinitis, unspecified: Secondary | ICD-10-CM | POA: Diagnosis not present

## 2012-11-11 DIAGNOSIS — R059 Cough, unspecified: Secondary | ICD-10-CM | POA: Diagnosis not present

## 2012-11-11 DIAGNOSIS — F17200 Nicotine dependence, unspecified, uncomplicated: Secondary | ICD-10-CM | POA: Diagnosis not present

## 2012-11-11 DIAGNOSIS — H65199 Other acute nonsuppurative otitis media, unspecified ear: Secondary | ICD-10-CM | POA: Diagnosis not present

## 2012-11-11 DIAGNOSIS — IMO0002 Reserved for concepts with insufficient information to code with codable children: Secondary | ICD-10-CM | POA: Diagnosis not present

## 2012-11-19 DIAGNOSIS — H2510 Age-related nuclear cataract, unspecified eye: Secondary | ICD-10-CM | POA: Diagnosis not present

## 2013-01-05 DIAGNOSIS — H10029 Other mucopurulent conjunctivitis, unspecified eye: Secondary | ICD-10-CM | POA: Diagnosis not present

## 2013-01-05 DIAGNOSIS — H5789 Other specified disorders of eye and adnexa: Secondary | ICD-10-CM | POA: Diagnosis not present

## 2013-01-10 DIAGNOSIS — H5789 Other specified disorders of eye and adnexa: Secondary | ICD-10-CM | POA: Diagnosis not present

## 2013-01-10 DIAGNOSIS — H10029 Other mucopurulent conjunctivitis, unspecified eye: Secondary | ICD-10-CM | POA: Diagnosis not present

## 2013-01-25 DIAGNOSIS — H2510 Age-related nuclear cataract, unspecified eye: Secondary | ICD-10-CM | POA: Diagnosis not present

## 2013-01-28 DIAGNOSIS — I6529 Occlusion and stenosis of unspecified carotid artery: Secondary | ICD-10-CM | POA: Diagnosis not present

## 2013-01-28 DIAGNOSIS — I10 Essential (primary) hypertension: Secondary | ICD-10-CM | POA: Diagnosis not present

## 2013-01-28 DIAGNOSIS — E559 Vitamin D deficiency, unspecified: Secondary | ICD-10-CM | POA: Diagnosis not present

## 2013-01-28 DIAGNOSIS — F17200 Nicotine dependence, unspecified, uncomplicated: Secondary | ICD-10-CM | POA: Diagnosis not present

## 2013-01-28 DIAGNOSIS — Z1331 Encounter for screening for depression: Secondary | ICD-10-CM | POA: Diagnosis not present

## 2013-01-28 DIAGNOSIS — Z0000 Encounter for general adult medical examination without abnormal findings: Secondary | ICD-10-CM | POA: Diagnosis not present

## 2013-01-28 DIAGNOSIS — R011 Cardiac murmur, unspecified: Secondary | ICD-10-CM | POA: Diagnosis not present

## 2013-01-28 DIAGNOSIS — Z79899 Other long term (current) drug therapy: Secondary | ICD-10-CM | POA: Diagnosis not present

## 2013-01-28 DIAGNOSIS — M1990 Unspecified osteoarthritis, unspecified site: Secondary | ICD-10-CM | POA: Diagnosis not present

## 2013-01-28 DIAGNOSIS — E119 Type 2 diabetes mellitus without complications: Secondary | ICD-10-CM | POA: Diagnosis not present

## 2013-02-22 DIAGNOSIS — L821 Other seborrheic keratosis: Secondary | ICD-10-CM | POA: Diagnosis not present

## 2013-02-22 DIAGNOSIS — L570 Actinic keratosis: Secondary | ICD-10-CM | POA: Diagnosis not present

## 2013-03-18 DIAGNOSIS — H269 Unspecified cataract: Secondary | ICD-10-CM | POA: Diagnosis not present

## 2013-03-18 DIAGNOSIS — H2510 Age-related nuclear cataract, unspecified eye: Secondary | ICD-10-CM | POA: Diagnosis not present

## 2013-04-11 DIAGNOSIS — H2510 Age-related nuclear cataract, unspecified eye: Secondary | ICD-10-CM | POA: Diagnosis not present

## 2013-04-11 DIAGNOSIS — H269 Unspecified cataract: Secondary | ICD-10-CM | POA: Diagnosis not present

## 2013-04-29 DIAGNOSIS — Z1231 Encounter for screening mammogram for malignant neoplasm of breast: Secondary | ICD-10-CM | POA: Diagnosis not present

## 2013-06-20 DIAGNOSIS — M67919 Unspecified disorder of synovium and tendon, unspecified shoulder: Secondary | ICD-10-CM | POA: Diagnosis not present

## 2013-06-27 DIAGNOSIS — M75100 Unspecified rotator cuff tear or rupture of unspecified shoulder, not specified as traumatic: Secondary | ICD-10-CM | POA: Diagnosis not present

## 2013-06-27 DIAGNOSIS — M25519 Pain in unspecified shoulder: Secondary | ICD-10-CM | POA: Diagnosis not present

## 2013-07-14 DIAGNOSIS — Z23 Encounter for immunization: Secondary | ICD-10-CM | POA: Diagnosis not present

## 2013-07-18 DIAGNOSIS — M25519 Pain in unspecified shoulder: Secondary | ICD-10-CM | POA: Diagnosis not present

## 2013-07-22 DIAGNOSIS — M25519 Pain in unspecified shoulder: Secondary | ICD-10-CM | POA: Diagnosis not present

## 2013-08-08 DIAGNOSIS — M75120 Complete rotator cuff tear or rupture of unspecified shoulder, not specified as traumatic: Secondary | ICD-10-CM | POA: Diagnosis not present

## 2013-10-09 DIAGNOSIS — J209 Acute bronchitis, unspecified: Secondary | ICD-10-CM | POA: Diagnosis not present

## 2013-10-09 DIAGNOSIS — J019 Acute sinusitis, unspecified: Secondary | ICD-10-CM | POA: Diagnosis not present

## 2013-11-07 DIAGNOSIS — R0789 Other chest pain: Secondary | ICD-10-CM | POA: Diagnosis not present

## 2013-11-07 DIAGNOSIS — I10 Essential (primary) hypertension: Secondary | ICD-10-CM | POA: Diagnosis not present

## 2013-11-15 DIAGNOSIS — L570 Actinic keratosis: Secondary | ICD-10-CM | POA: Diagnosis not present

## 2013-11-21 ENCOUNTER — Encounter: Payer: Self-pay | Admitting: Cardiovascular Disease

## 2013-11-21 ENCOUNTER — Ambulatory Visit (HOSPITAL_COMMUNITY): Payer: Medicare Other | Attending: Cardiovascular Disease | Admitting: Radiology

## 2013-11-21 VITALS — BP 149/68 | HR 62 | Ht 62.0 in | Wt 124.0 lb

## 2013-11-21 DIAGNOSIS — Z8249 Family history of ischemic heart disease and other diseases of the circulatory system: Secondary | ICD-10-CM | POA: Insufficient documentation

## 2013-11-21 DIAGNOSIS — J4489 Other specified chronic obstructive pulmonary disease: Secondary | ICD-10-CM | POA: Insufficient documentation

## 2013-11-21 DIAGNOSIS — R5381 Other malaise: Secondary | ICD-10-CM | POA: Diagnosis not present

## 2013-11-21 DIAGNOSIS — J449 Chronic obstructive pulmonary disease, unspecified: Secondary | ICD-10-CM | POA: Diagnosis not present

## 2013-11-21 DIAGNOSIS — R0602 Shortness of breath: Secondary | ICD-10-CM | POA: Diagnosis not present

## 2013-11-21 DIAGNOSIS — F17200 Nicotine dependence, unspecified, uncomplicated: Secondary | ICD-10-CM | POA: Insufficient documentation

## 2013-11-21 DIAGNOSIS — R0609 Other forms of dyspnea: Secondary | ICD-10-CM | POA: Diagnosis not present

## 2013-11-21 DIAGNOSIS — I739 Peripheral vascular disease, unspecified: Secondary | ICD-10-CM | POA: Insufficient documentation

## 2013-11-21 DIAGNOSIS — I10 Essential (primary) hypertension: Secondary | ICD-10-CM | POA: Insufficient documentation

## 2013-11-21 DIAGNOSIS — R0989 Other specified symptoms and signs involving the circulatory and respiratory systems: Secondary | ICD-10-CM | POA: Insufficient documentation

## 2013-11-21 DIAGNOSIS — R0789 Other chest pain: Secondary | ICD-10-CM | POA: Diagnosis not present

## 2013-11-21 DIAGNOSIS — I779 Disorder of arteries and arterioles, unspecified: Secondary | ICD-10-CM | POA: Diagnosis not present

## 2013-11-21 DIAGNOSIS — E785 Hyperlipidemia, unspecified: Secondary | ICD-10-CM | POA: Insufficient documentation

## 2013-11-21 DIAGNOSIS — R5383 Other fatigue: Secondary | ICD-10-CM

## 2013-11-21 DIAGNOSIS — R079 Chest pain, unspecified: Secondary | ICD-10-CM | POA: Diagnosis not present

## 2013-11-21 MED ORDER — TECHNETIUM TC 99M SESTAMIBI GENERIC - CARDIOLITE
33.0000 | Freq: Once | INTRAVENOUS | Status: AC | PRN
  Administered 2013-11-21: 33 via INTRAVENOUS

## 2013-11-21 MED ORDER — REGADENOSON 0.4 MG/5ML IV SOLN
0.4000 mg | Freq: Once | INTRAVENOUS | Status: AC
  Administered 2013-11-21: 0.4 mg via INTRAVENOUS

## 2013-11-21 MED ORDER — TECHNETIUM TC 99M SESTAMIBI GENERIC - CARDIOLITE
11.0000 | Freq: Once | INTRAVENOUS | Status: AC | PRN
  Administered 2013-11-21: 11 via INTRAVENOUS

## 2013-11-21 NOTE — Progress Notes (Signed)
[facility] [address] [place], [place] [address] [phone]    Cardiology Nuclear Med Study  Elizabeth [name] is a 73 y.o. female     MRN : [id number]     DOB: 14-Dec-1940  Procedure Date: 11/21/2013  Nuclear Med Background Indication for Stress Test:  Evaluation for Ischemia History:  No known CAD, MPI 2011 (normal), COPD, Echo 2013 EF 70% Cardiac Risk Factors: Carotid Disease, Family History - CAD, Hypertension, Lipids, PVD and Smoker  Symptoms:  Chest Pain (last date of chest discomfort was two weeks ago), DOE and Fatigue   Nuclear Pre-Procedure Caffeine/Decaff Intake:  Decaffeinated at 5:30am NPO After: 5:30 am   Lungs:  clear O2 Sat: 95% on room air. IV 0.9% NS with Angio Cath:  22g  IV Site: R Antecubital x 1, tolerated well IV Started by:  Irven Baltimore, RN  Chest Size (in):  34 Cup Size: C  Height: 5\' 2"  (1.575 m)  Weight:  124 lb (56.246 kg)  BMI:  Body mass index is 22.67 kg/(m^2). Tech Comments:  Took Am Medications    Nuclear Med Study 1 or 2 day study: 1 day  Stress Test Type:  Treadmill/Lexiscan  Reading MD: N/A  Order Authorizing Provider:  [name], MD  Resting Radionuclide: Technetium 39m Sestamibi  Resting Radionuclide Dose: 11.0 mCi   Stress Radionuclide:  Technetium 72m Sestamibi  Stress Radionuclide Dose: 33.0 mCi           Stress Protocol Rest HR: 62 Stress HR: 108  Rest BP: 149/68 Stress BP: 136/54  Exercise Time (min): n/a METS: n/a           Dose of Adenosine (mg):  n/a Dose of Lexiscan: 0.4 mg  Dose of Atropine (mg): n/a Dose of Dobutamine: n/a mcg/kg/min (at max HR)  Stress Test Technologist: Glade Lloyd, BS-ES  Nuclear Technologist:  Annye Rusk, CNMT     Rest Procedure:  Myocardial perfusion imaging was performed at rest 45 minutes following the intravenous administration of Technetium 91m Sestamibi. Rest ECG: NSR with non-specific ST-T wave changes  Stress Procedure:  The patient received IV  Lexiscan 0.4 mg over 15-seconds with concurrent low level exercise and then Technetium 25m Sestamibi was injected at 30-seconds while the patient continued walking one more minute.  Quantitative spect images were obtained after a 45-minute delay.  During the infusion of Lexiscan, the patient developed a cough and became lightheaded.  This began to resolve in recovery.  Stress ECG: No significant change from baseline ECG  QPS Raw Data Images:  Normal; no motion artifact; normal heart/lung ratio. Stress Images:  Normal homogeneous uptake in all areas of the myocardium. Rest Images:  Normal homogeneous uptake in all areas of the myocardium. Subtraction (SDS):  Normal Transient Ischemic Dilatation (Normal <1.22):  0.97 Lung/Heart Ratio (Normal <0.45):  0.28  Quantitative Gated Spect Images QGS EDV:  60 ml QGS ESV:  17 ml  Impression Exercise Capacity:  Lexiscan with low level exercise. BP Response:  Normal blood pressure response. Clinical Symptoms:  coughing ECG Impression:  No significant ST segment change suggestive of ischemia. Comparison with Prior Nuclear Study: No images to compare  Overall Impression:  Normal stress nuclear study.  LV Ejection Fraction: 72%.  LV Wall Motion:  NL LV Function; NL Wall Motion   Jenkins Rouge

## 2013-12-05 DIAGNOSIS — J449 Chronic obstructive pulmonary disease, unspecified: Secondary | ICD-10-CM | POA: Diagnosis not present

## 2013-12-05 DIAGNOSIS — I509 Heart failure, unspecified: Secondary | ICD-10-CM | POA: Diagnosis not present

## 2013-12-05 DIAGNOSIS — I6529 Occlusion and stenosis of unspecified carotid artery: Secondary | ICD-10-CM | POA: Diagnosis not present

## 2013-12-05 DIAGNOSIS — I10 Essential (primary) hypertension: Secondary | ICD-10-CM | POA: Diagnosis not present

## 2013-12-05 DIAGNOSIS — E119 Type 2 diabetes mellitus without complications: Secondary | ICD-10-CM | POA: Diagnosis not present

## 2013-12-05 DIAGNOSIS — F17200 Nicotine dependence, unspecified, uncomplicated: Secondary | ICD-10-CM | POA: Diagnosis not present

## 2013-12-05 DIAGNOSIS — E7800 Pure hypercholesterolemia, unspecified: Secondary | ICD-10-CM | POA: Diagnosis not present

## 2013-12-05 DIAGNOSIS — M1990 Unspecified osteoarthritis, unspecified site: Secondary | ICD-10-CM | POA: Diagnosis not present

## 2014-01-02 DIAGNOSIS — I10 Essential (primary) hypertension: Secondary | ICD-10-CM | POA: Diagnosis not present

## 2014-01-02 DIAGNOSIS — E119 Type 2 diabetes mellitus without complications: Secondary | ICD-10-CM | POA: Diagnosis not present

## 2014-01-02 DIAGNOSIS — J449 Chronic obstructive pulmonary disease, unspecified: Secondary | ICD-10-CM | POA: Diagnosis not present

## 2014-01-02 DIAGNOSIS — I6529 Occlusion and stenosis of unspecified carotid artery: Secondary | ICD-10-CM | POA: Diagnosis not present

## 2014-01-02 DIAGNOSIS — F17200 Nicotine dependence, unspecified, uncomplicated: Secondary | ICD-10-CM | POA: Diagnosis not present

## 2014-01-02 DIAGNOSIS — E7800 Pure hypercholesterolemia, unspecified: Secondary | ICD-10-CM | POA: Diagnosis not present

## 2014-01-02 DIAGNOSIS — I509 Heart failure, unspecified: Secondary | ICD-10-CM | POA: Diagnosis not present

## 2014-01-02 DIAGNOSIS — M1990 Unspecified osteoarthritis, unspecified site: Secondary | ICD-10-CM | POA: Diagnosis not present

## 2014-02-14 DIAGNOSIS — L570 Actinic keratosis: Secondary | ICD-10-CM | POA: Diagnosis not present

## 2014-02-14 DIAGNOSIS — L821 Other seborrheic keratosis: Secondary | ICD-10-CM | POA: Diagnosis not present

## 2014-02-28 DIAGNOSIS — F17200 Nicotine dependence, unspecified, uncomplicated: Secondary | ICD-10-CM | POA: Diagnosis not present

## 2014-02-28 DIAGNOSIS — Z0000 Encounter for general adult medical examination without abnormal findings: Secondary | ICD-10-CM | POA: Diagnosis not present

## 2014-02-28 DIAGNOSIS — M1990 Unspecified osteoarthritis, unspecified site: Secondary | ICD-10-CM | POA: Diagnosis not present

## 2014-02-28 DIAGNOSIS — I10 Essential (primary) hypertension: Secondary | ICD-10-CM | POA: Diagnosis not present

## 2014-02-28 DIAGNOSIS — E7800 Pure hypercholesterolemia, unspecified: Secondary | ICD-10-CM | POA: Diagnosis not present

## 2014-02-28 DIAGNOSIS — E119 Type 2 diabetes mellitus without complications: Secondary | ICD-10-CM | POA: Diagnosis not present

## 2014-02-28 DIAGNOSIS — Z1331 Encounter for screening for depression: Secondary | ICD-10-CM | POA: Diagnosis not present

## 2014-02-28 DIAGNOSIS — I499 Cardiac arrhythmia, unspecified: Secondary | ICD-10-CM | POA: Diagnosis not present

## 2014-02-28 DIAGNOSIS — E559 Vitamin D deficiency, unspecified: Secondary | ICD-10-CM | POA: Diagnosis not present

## 2014-05-04 DIAGNOSIS — Z1231 Encounter for screening mammogram for malignant neoplasm of breast: Secondary | ICD-10-CM | POA: Diagnosis not present

## 2014-06-07 DIAGNOSIS — I10 Essential (primary) hypertension: Secondary | ICD-10-CM | POA: Diagnosis not present

## 2014-08-04 DIAGNOSIS — H52223 Regular astigmatism, bilateral: Secondary | ICD-10-CM | POA: Diagnosis not present

## 2014-08-04 DIAGNOSIS — H5212 Myopia, left eye: Secondary | ICD-10-CM | POA: Diagnosis not present

## 2014-08-04 DIAGNOSIS — H04123 Dry eye syndrome of bilateral lacrimal glands: Secondary | ICD-10-CM | POA: Diagnosis not present

## 2014-08-04 DIAGNOSIS — H5201 Hypermetropia, right eye: Secondary | ICD-10-CM | POA: Diagnosis not present

## 2014-08-04 DIAGNOSIS — I10 Essential (primary) hypertension: Secondary | ICD-10-CM | POA: Diagnosis not present

## 2014-08-04 DIAGNOSIS — H524 Presbyopia: Secondary | ICD-10-CM | POA: Diagnosis not present

## 2014-08-30 DIAGNOSIS — H18412 Arcus senilis, left eye: Secondary | ICD-10-CM | POA: Diagnosis not present

## 2014-08-30 DIAGNOSIS — H18411 Arcus senilis, right eye: Secondary | ICD-10-CM | POA: Diagnosis not present

## 2014-08-30 DIAGNOSIS — H1851 Endothelial corneal dystrophy: Secondary | ICD-10-CM | POA: Diagnosis not present

## 2014-08-30 DIAGNOSIS — Z961 Presence of intraocular lens: Secondary | ICD-10-CM | POA: Diagnosis not present

## 2014-09-01 ENCOUNTER — Other Ambulatory Visit: Payer: Self-pay | Admitting: Internal Medicine

## 2014-09-01 ENCOUNTER — Ambulatory Visit
Admission: RE | Admit: 2014-09-01 | Discharge: 2014-09-01 | Disposition: A | Discharge status: Home / Self Care | Payer: Medicare Other | Source: Ambulatory Visit | Attending: Internal Medicine | Admitting: Internal Medicine

## 2014-09-01 DIAGNOSIS — J449 Chronic obstructive pulmonary disease, unspecified: Secondary | ICD-10-CM | POA: Diagnosis not present

## 2014-09-01 DIAGNOSIS — I6523 Occlusion and stenosis of bilateral carotid arteries: Secondary | ICD-10-CM | POA: Diagnosis not present

## 2014-09-01 DIAGNOSIS — F17200 Nicotine dependence, unspecified, uncomplicated: Secondary | ICD-10-CM | POA: Diagnosis not present

## 2014-09-01 DIAGNOSIS — F17210 Nicotine dependence, cigarettes, uncomplicated: Secondary | ICD-10-CM

## 2014-09-01 DIAGNOSIS — E782 Mixed hyperlipidemia: Secondary | ICD-10-CM | POA: Diagnosis not present

## 2014-09-01 DIAGNOSIS — R05 Cough: Secondary | ICD-10-CM | POA: Diagnosis not present

## 2014-09-01 DIAGNOSIS — M859 Disorder of bone density and structure, unspecified: Secondary | ICD-10-CM | POA: Diagnosis not present

## 2014-09-01 DIAGNOSIS — Z23 Encounter for immunization: Secondary | ICD-10-CM | POA: Diagnosis not present

## 2014-09-01 DIAGNOSIS — K219 Gastro-esophageal reflux disease without esophagitis: Secondary | ICD-10-CM | POA: Diagnosis not present

## 2014-09-01 DIAGNOSIS — G4710 Hypersomnia, unspecified: Secondary | ICD-10-CM | POA: Diagnosis not present

## 2014-09-01 DIAGNOSIS — I10 Essential (primary) hypertension: Secondary | ICD-10-CM | POA: Diagnosis not present

## 2014-09-25 DIAGNOSIS — H26491 Other secondary cataract, right eye: Secondary | ICD-10-CM | POA: Diagnosis not present

## 2014-09-25 DIAGNOSIS — Z961 Presence of intraocular lens: Secondary | ICD-10-CM | POA: Diagnosis not present

## 2014-09-25 DIAGNOSIS — I10 Essential (primary) hypertension: Secondary | ICD-10-CM | POA: Diagnosis not present

## 2014-09-25 DIAGNOSIS — H18413 Arcus senilis, bilateral: Secondary | ICD-10-CM | POA: Diagnosis not present

## 2014-10-02 DIAGNOSIS — H5201 Hypermetropia, right eye: Secondary | ICD-10-CM | POA: Diagnosis not present

## 2014-10-02 DIAGNOSIS — H52221 Regular astigmatism, right eye: Secondary | ICD-10-CM | POA: Diagnosis not present

## 2014-10-02 DIAGNOSIS — H04123 Dry eye syndrome of bilateral lacrimal glands: Secondary | ICD-10-CM | POA: Diagnosis not present

## 2014-10-02 DIAGNOSIS — Z961 Presence of intraocular lens: Secondary | ICD-10-CM | POA: Diagnosis not present

## 2014-11-27 DIAGNOSIS — K130 Diseases of lips: Secondary | ICD-10-CM | POA: Diagnosis not present

## 2014-12-24 ENCOUNTER — Ambulatory Visit (INDEPENDENT_AMBULATORY_CARE_PROVIDER_SITE_OTHER): Payer: Medicare Other | Admitting: Emergency Medicine

## 2014-12-24 ENCOUNTER — Ambulatory Visit (INDEPENDENT_AMBULATORY_CARE_PROVIDER_SITE_OTHER): Payer: Medicare Other

## 2014-12-24 VITALS — BP 160/78 | HR 83 | Temp 97.8°F | Resp 16 | Ht 60.5 in | Wt 119.1 lb

## 2014-12-24 DIAGNOSIS — M542 Cervicalgia: Secondary | ICD-10-CM | POA: Diagnosis not present

## 2014-12-24 MED ORDER — HYDROCODONE-ACETAMINOPHEN 5-325 MG PO TABS: 1.0000 | ORAL_TABLET | Freq: Four times a day (QID) | ORAL | Status: DC | PRN

## 2014-12-24 MED ORDER — GABAPENTIN 100 MG PO CAPS: ORAL_CAPSULE | ORAL | Status: DC

## 2014-12-24 NOTE — Progress Notes (Deleted)
   Subjective:    Patient ID: Elizabeth [name], female    DOB: 1941/01/24, 74 y.o.   MRN: [id number]  HPI    Review of Systems     Objective:   Physical Exam        Assessment & Plan:

## 2014-12-24 NOTE — Patient Instructions (Signed)

## 2014-12-24 NOTE — Progress Notes (Addendum)
This chart was scribed for Elizabeth Russian, MD by Edison Simon, ED Scribe. This patient was seen in room 9 and the patient's care was started at 2:37 PM.   Subjective:    Patient ID: Elizabeth [name], female    DOB: 1940/12/28, 74 y.o.   MRN: [id number]  HPI  HPI Comments: Elizabeth [name] is a 74 y.o. female who presents to the [facility] complaining of left shoulder and neck pain with onset 4 days ago, worsening progressively. She states it began when she felt a burning sensation to her left neck while working out. She states she works out 3-4 days a week. She states that on waking today, pain was radiating to her posterior head. She states she has used heat, cold, asper cream, and ibuprofen without remission. She believes rash to her  She states she has had strains before but has not had pain similar to this. She states she had shingles 15 years ago and states she had shingles vaccine last year. She reports history of HTN and HLD but states she is generally healthy. She states she is not using Celebrex.   Review of Systems  Musculoskeletal: Positive for neck pain.       Left shoulder pain  Skin: Positive for rash.  Neurological: Positive for headaches.  All other systems reviewed and are negative.      Objective:   Physical Exam  Vitals reviewed.   CONSTITUTIONAL: Well developed/well nourished, Patient appears to be in significant pain to her neck and hsolder, alert and cooperative, HEAD: Normocephalic/atraumatic EYES: EOMI/PERRL ENMT: Mucous membranes moist NECK: no meningeal signs SPINE/BACK: there is tenderness over C6 to T1,  CV: S1/S2 noted, no murmurs/rubs/gallops noted LUNGS: Lungs are clear to auscultation bilaterally, no apparent distress ABDOMEN: soft, nontender, no rebound or guarding, bowel sounds noted throughout abdomen GU:no cva tenderness NEURO: Pt is awake/alert/appropriate, moves all extremitiesx4.  No facial droop.   EXTREMITIES: pulses  normal/equal, tenderness to left supraclavicular area, DTR are 2+ and symmetrical, pain with any rotation of the neck or elevation of the left shoulder SKIN: warm, color normal there is an excoriated area over the top of the left shoulder. There are no blisterlike areas. PSYCH: no abnormalities of mood noted, alert and oriented to situation  Meds ordered this encounter  Medications  . metoprolol succinate (TOPROL-XL) 25 MG 24 hr tablet    Sig: Take 25 mg by mouth daily.  Marland Kitchen gabapentin (NEURONTIN) 100 MG capsule    Sig: Start with one capsule at night and increase to 2 capsules at night after 2-3 evenings.    Dispense:  60 capsule    Refill:  3  . HYDROcodone-acetaminophen (NORCO) 5-325 MG per tablet    Sig: Take 1 tablet by mouth every 6 (six) hours as needed.    Dispense:  16 tablet    Refill:  0   [facility] (PRIMARY) x-ray report read by Dr. 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 She has C5-6 and C6-7 degenerative disc disease. Will treat with hydrocodone and Neurontin at night.. She will follow-up with Dr. Mertha Finders.I personally performed the services described in this documentation, which was scribed in my presence. The recorded information has been reviewed and is accurate she has had shingles before and has had the shingles vaccine  one year ago.Marland Kitchen

## 2014-12-26 DIAGNOSIS — M5412 Radiculopathy, cervical region: Secondary | ICD-10-CM | POA: Diagnosis not present

## 2015-02-13 DIAGNOSIS — L821 Other seborrheic keratosis: Secondary | ICD-10-CM | POA: Diagnosis not present

## 2015-04-24 ENCOUNTER — Other Ambulatory Visit: Payer: Self-pay | Admitting: Emergency Medicine

## 2015-04-30 ENCOUNTER — Other Ambulatory Visit: Payer: Self-pay

## 2015-05-22 DIAGNOSIS — Z803 Family history of malignant neoplasm of breast: Secondary | ICD-10-CM | POA: Diagnosis not present

## 2015-05-22 DIAGNOSIS — Z1231 Encounter for screening mammogram for malignant neoplasm of breast: Secondary | ICD-10-CM | POA: Diagnosis not present

## 2015-07-03 DIAGNOSIS — Z23 Encounter for immunization: Secondary | ICD-10-CM | POA: Diagnosis not present

## 2015-07-03 DIAGNOSIS — J309 Allergic rhinitis, unspecified: Secondary | ICD-10-CM | POA: Diagnosis not present

## 2015-07-03 DIAGNOSIS — E559 Vitamin D deficiency, unspecified: Secondary | ICD-10-CM | POA: Diagnosis not present

## 2015-07-03 DIAGNOSIS — J449 Chronic obstructive pulmonary disease, unspecified: Secondary | ICD-10-CM | POA: Diagnosis not present

## 2015-07-03 DIAGNOSIS — F17210 Nicotine dependence, cigarettes, uncomplicated: Secondary | ICD-10-CM | POA: Diagnosis not present

## 2015-07-03 DIAGNOSIS — Z1389 Encounter for screening for other disorder: Secondary | ICD-10-CM | POA: Diagnosis not present

## 2015-07-03 DIAGNOSIS — I493 Ventricular premature depolarization: Secondary | ICD-10-CM | POA: Diagnosis not present

## 2015-07-03 DIAGNOSIS — I10 Essential (primary) hypertension: Secondary | ICD-10-CM | POA: Diagnosis not present

## 2015-07-03 DIAGNOSIS — Z0001 Encounter for general adult medical examination with abnormal findings: Secondary | ICD-10-CM | POA: Diagnosis not present

## 2015-07-03 DIAGNOSIS — E782 Mixed hyperlipidemia: Secondary | ICD-10-CM | POA: Diagnosis not present

## 2015-07-03 DIAGNOSIS — Z79899 Other long term (current) drug therapy: Secondary | ICD-10-CM | POA: Diagnosis not present

## 2015-07-03 DIAGNOSIS — I6523 Occlusion and stenosis of bilateral carotid arteries: Secondary | ICD-10-CM | POA: Diagnosis not present

## 2015-07-25 DIAGNOSIS — Z780 Asymptomatic menopausal state: Secondary | ICD-10-CM | POA: Diagnosis not present

## 2015-07-25 DIAGNOSIS — M810 Age-related osteoporosis without current pathological fracture: Secondary | ICD-10-CM | POA: Diagnosis not present

## 2015-07-29 ENCOUNTER — Other Ambulatory Visit: Payer: Self-pay | Admitting: Emergency Medicine

## 2015-08-08 DIAGNOSIS — M461 Sacroiliitis, not elsewhere classified: Secondary | ICD-10-CM | POA: Diagnosis not present

## 2015-08-08 DIAGNOSIS — M4722 Other spondylosis with radiculopathy, cervical region: Secondary | ICD-10-CM | POA: Diagnosis not present

## 2015-08-08 DIAGNOSIS — M791 Myalgia: Secondary | ICD-10-CM | POA: Diagnosis not present

## 2015-08-08 DIAGNOSIS — M5023 Other cervical disc displacement, cervicothoracic region: Secondary | ICD-10-CM | POA: Diagnosis not present

## 2015-08-08 DIAGNOSIS — M25551 Pain in right hip: Secondary | ICD-10-CM | POA: Diagnosis not present

## 2015-08-08 DIAGNOSIS — M545 Low back pain: Secondary | ICD-10-CM | POA: Diagnosis not present

## 2015-08-08 DIAGNOSIS — M542 Cervicalgia: Secondary | ICD-10-CM | POA: Diagnosis not present

## 2015-08-10 DIAGNOSIS — M791 Myalgia: Secondary | ICD-10-CM | POA: Diagnosis not present

## 2015-08-10 DIAGNOSIS — M461 Sacroiliitis, not elsewhere classified: Secondary | ICD-10-CM | POA: Diagnosis not present

## 2015-08-10 DIAGNOSIS — M5023 Other cervical disc displacement, cervicothoracic region: Secondary | ICD-10-CM | POA: Diagnosis not present

## 2015-08-10 DIAGNOSIS — M4722 Other spondylosis with radiculopathy, cervical region: Secondary | ICD-10-CM | POA: Diagnosis not present

## 2015-08-10 DIAGNOSIS — M542 Cervicalgia: Secondary | ICD-10-CM | POA: Diagnosis not present

## 2015-09-10 ENCOUNTER — Other Ambulatory Visit: Payer: Self-pay | Admitting: Emergency Medicine

## 2015-09-21 DIAGNOSIS — J209 Acute bronchitis, unspecified: Secondary | ICD-10-CM | POA: Diagnosis not present

## 2015-10-25 IMAGING — CR DG SHOULDER 2+V*L*
3 series · 3 of 3 positions shown · non-contrast
Comparison: None.

CLINICAL DATA: Complains of left shoulder pain

EXAM:
LEFT SHOULDER - 2+ VIEW

[AP]
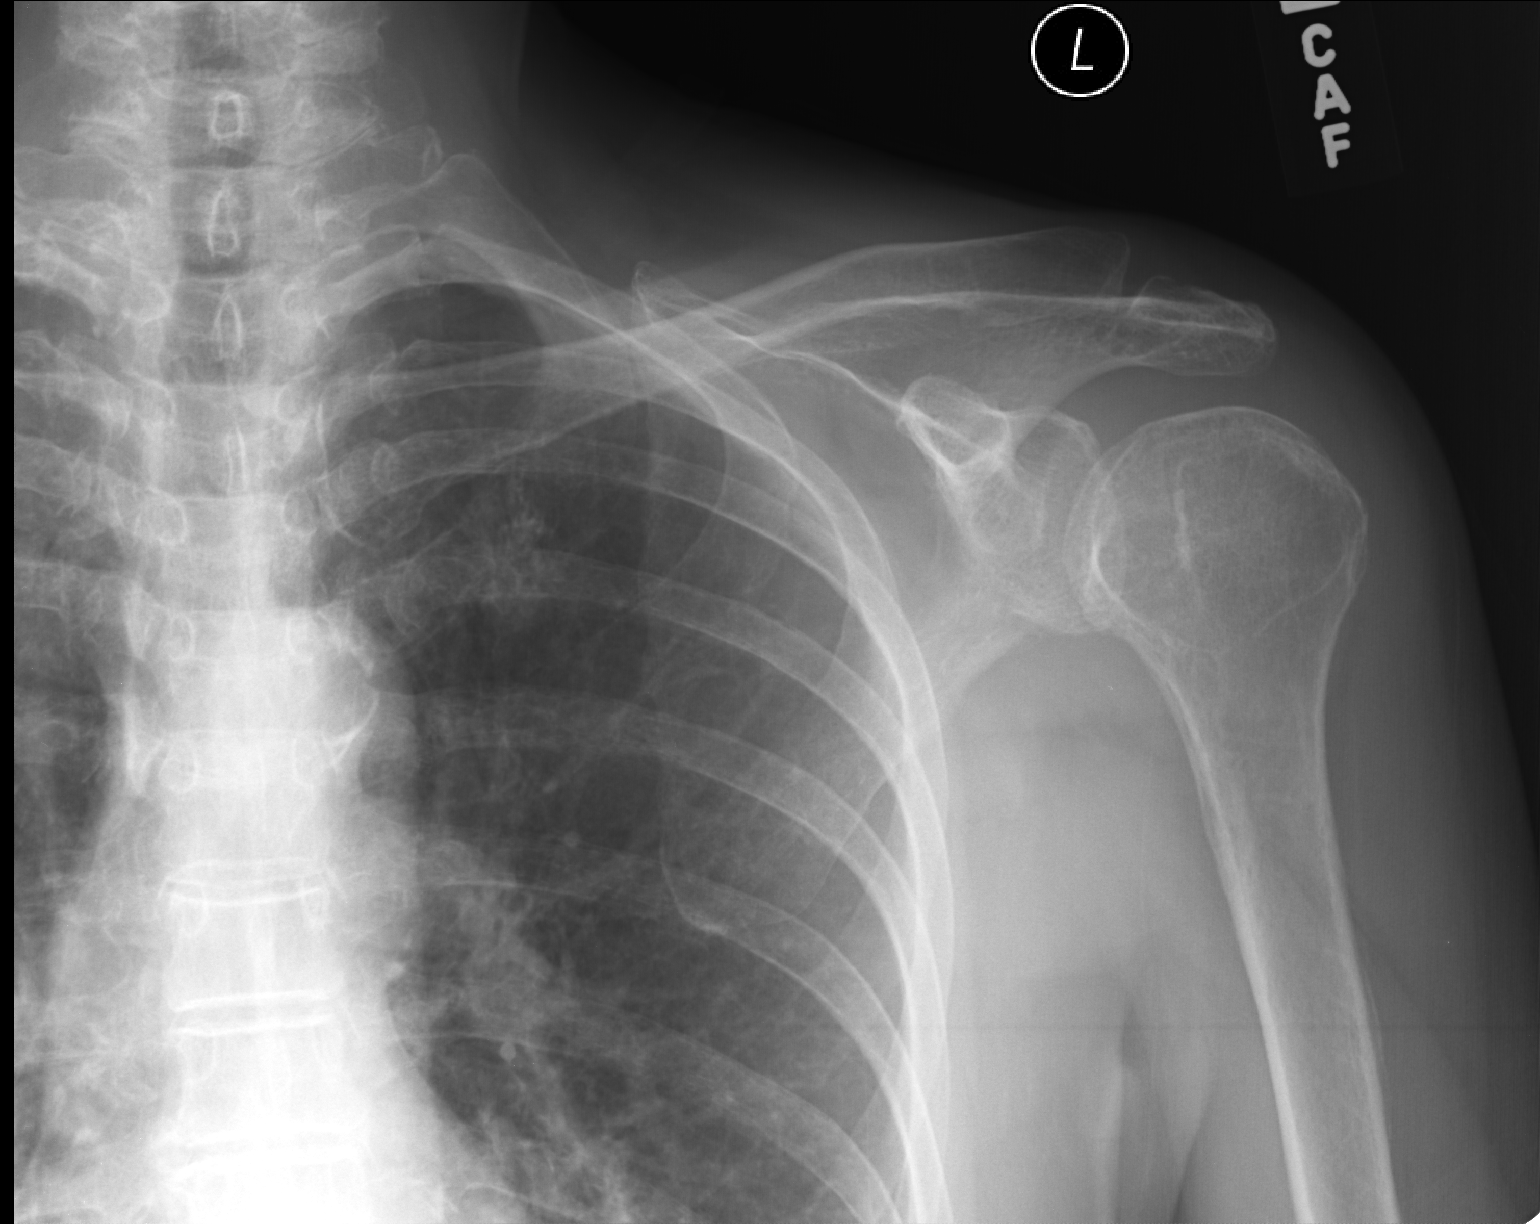

[ap ext rot]
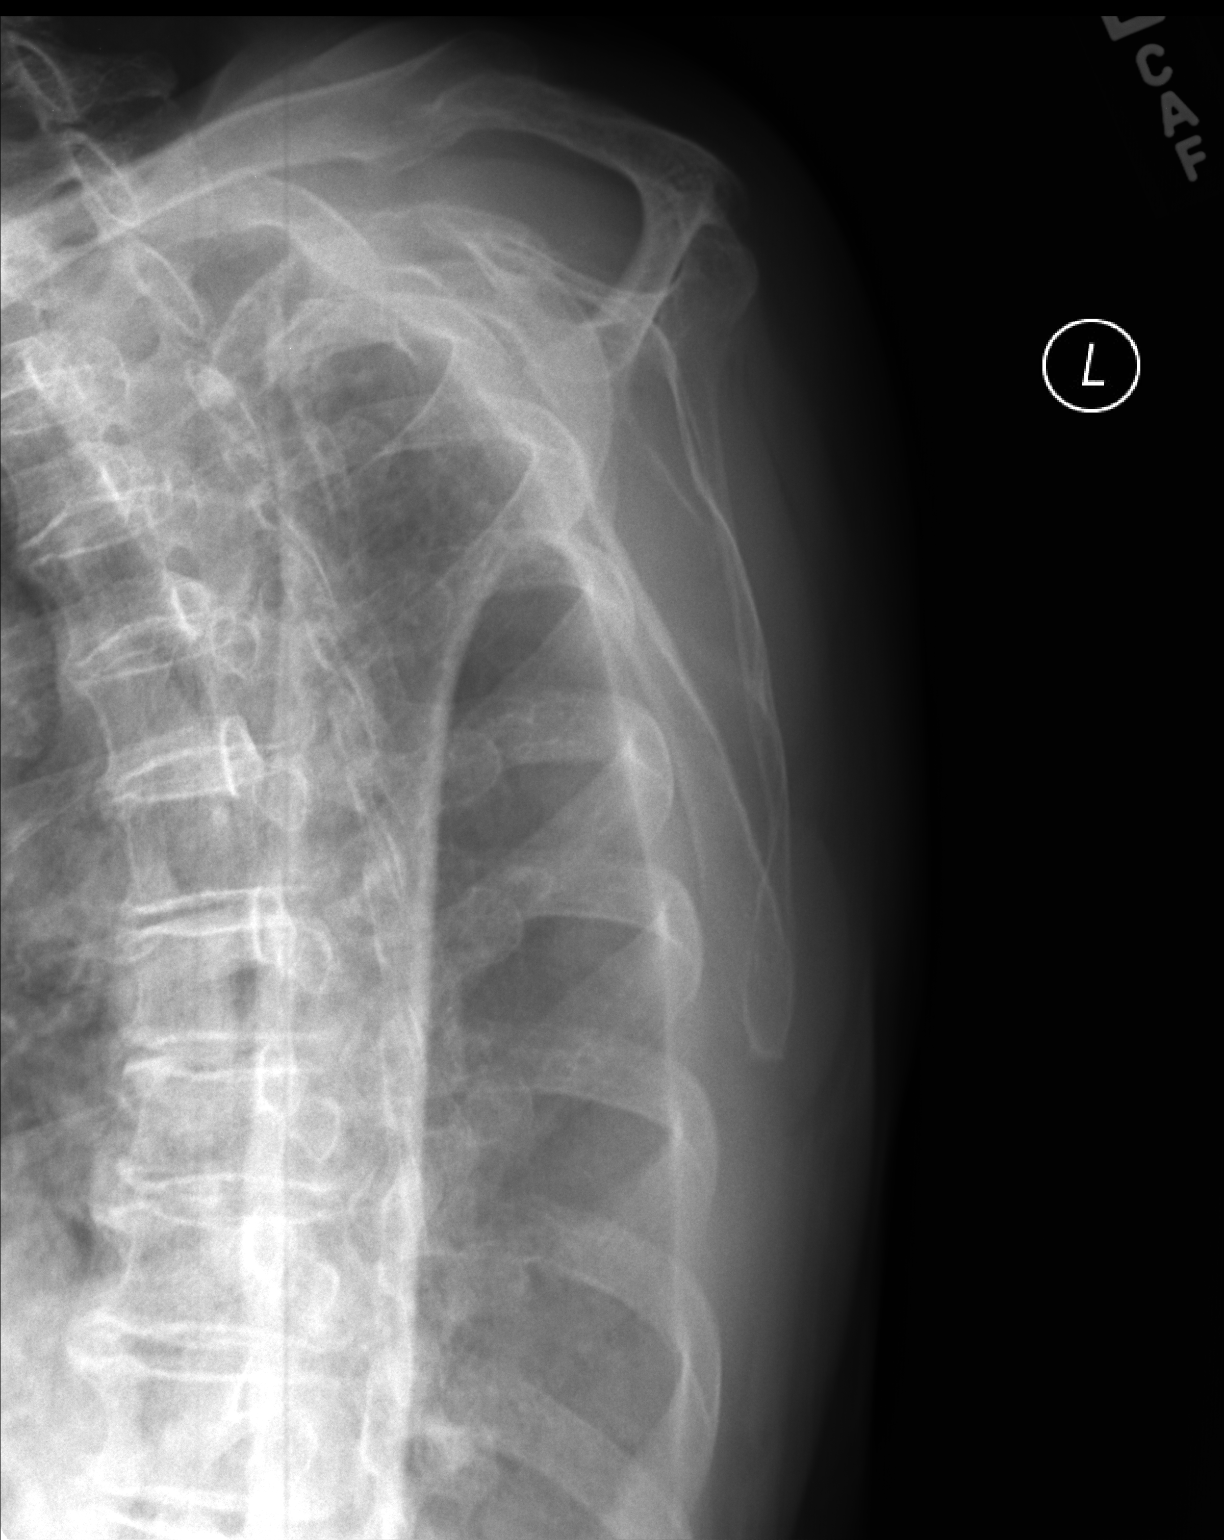

[ap int rot]
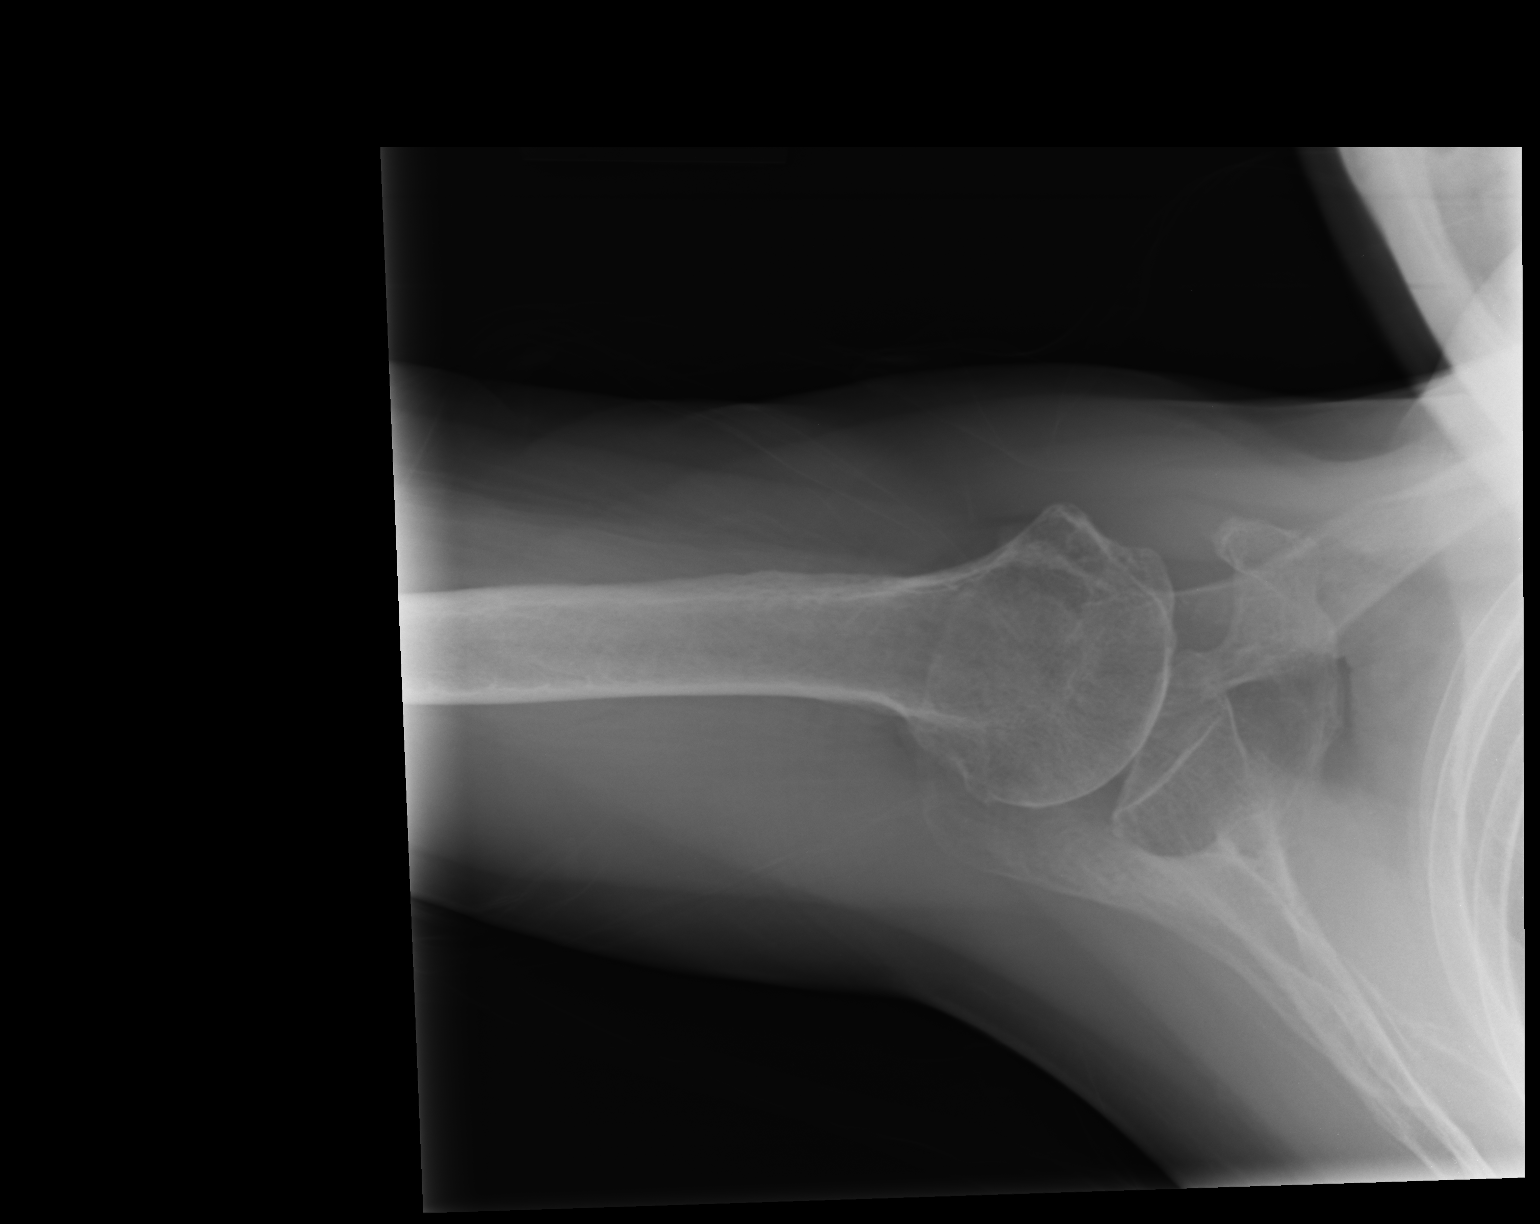

[3 of 3 positions shown; findings below may reference images not displayed]

FINDINGS: There is no evidence of fracture or dislocation. Mild osteoarthritis
involves the glenohumeral joint. Soft tissues are unremarkable.
IMPRESSION: 1. No acute findings.
2. Mild osteoarthritis.

## 2015-11-01 DIAGNOSIS — M65322 Trigger finger, left index finger: Secondary | ICD-10-CM | POA: Diagnosis not present

## 2016-02-11 DIAGNOSIS — F17210 Nicotine dependence, cigarettes, uncomplicated: Secondary | ICD-10-CM | POA: Diagnosis not present

## 2016-02-11 DIAGNOSIS — I6523 Occlusion and stenosis of bilateral carotid arteries: Secondary | ICD-10-CM | POA: Diagnosis not present

## 2016-02-11 DIAGNOSIS — I10 Essential (primary) hypertension: Secondary | ICD-10-CM | POA: Diagnosis not present

## 2016-02-11 DIAGNOSIS — R197 Diarrhea, unspecified: Secondary | ICD-10-CM | POA: Diagnosis not present

## 2016-02-11 DIAGNOSIS — I493 Ventricular premature depolarization: Secondary | ICD-10-CM | POA: Diagnosis not present

## 2016-02-11 DIAGNOSIS — E559 Vitamin D deficiency, unspecified: Secondary | ICD-10-CM | POA: Diagnosis not present

## 2016-02-11 DIAGNOSIS — J449 Chronic obstructive pulmonary disease, unspecified: Secondary | ICD-10-CM | POA: Diagnosis not present

## 2016-02-11 DIAGNOSIS — I739 Peripheral vascular disease, unspecified: Secondary | ICD-10-CM | POA: Diagnosis not present

## 2016-02-11 DIAGNOSIS — J309 Allergic rhinitis, unspecified: Secondary | ICD-10-CM | POA: Diagnosis not present

## 2016-02-11 DIAGNOSIS — E782 Mixed hyperlipidemia: Secondary | ICD-10-CM | POA: Diagnosis not present

## 2016-02-19 ENCOUNTER — Other Ambulatory Visit: Payer: Self-pay | Admitting: Internal Medicine

## 2016-02-19 DIAGNOSIS — F17200 Nicotine dependence, unspecified, uncomplicated: Secondary | ICD-10-CM

## 2016-02-19 DIAGNOSIS — I739 Peripheral vascular disease, unspecified: Secondary | ICD-10-CM

## 2016-02-26 DIAGNOSIS — L570 Actinic keratosis: Secondary | ICD-10-CM | POA: Diagnosis not present

## 2016-02-26 DIAGNOSIS — L821 Other seborrheic keratosis: Secondary | ICD-10-CM | POA: Diagnosis not present

## 2016-02-28 ENCOUNTER — Ambulatory Visit
Admission: RE | Admit: 2016-02-28 | Discharge: 2016-02-28 | Disposition: A | Discharge status: Home / Self Care | Payer: Medicare Other | Source: Ambulatory Visit | Attending: Internal Medicine | Admitting: Internal Medicine

## 2016-02-28 DIAGNOSIS — Z136 Encounter for screening for cardiovascular disorders: Secondary | ICD-10-CM | POA: Diagnosis not present

## 2016-02-28 DIAGNOSIS — I6523 Occlusion and stenosis of bilateral carotid arteries: Secondary | ICD-10-CM | POA: Diagnosis not present

## 2016-02-28 DIAGNOSIS — I739 Peripheral vascular disease, unspecified: Secondary | ICD-10-CM

## 2016-02-28 DIAGNOSIS — Z8679 Personal history of other diseases of the circulatory system: Secondary | ICD-10-CM | POA: Diagnosis not present

## 2016-02-28 DIAGNOSIS — Z87891 Personal history of nicotine dependence: Secondary | ICD-10-CM | POA: Diagnosis not present

## 2016-02-28 DIAGNOSIS — F17200 Nicotine dependence, unspecified, uncomplicated: Secondary | ICD-10-CM

## 2016-03-27 ENCOUNTER — Ambulatory Visit
Admission: RE | Admit: 2016-03-27 | Discharge: 2016-03-27 | Disposition: A | Discharge status: Home / Self Care | Payer: Medicare Other | Source: Ambulatory Visit | Attending: Nurse Practitioner | Admitting: Nurse Practitioner

## 2016-03-27 ENCOUNTER — Other Ambulatory Visit: Payer: Self-pay | Admitting: Nurse Practitioner

## 2016-03-27 DIAGNOSIS — R1031 Right lower quadrant pain: Secondary | ICD-10-CM

## 2016-03-27 DIAGNOSIS — M25551 Pain in right hip: Secondary | ICD-10-CM | POA: Diagnosis not present

## 2016-03-27 DIAGNOSIS — M1611 Unilateral primary osteoarthritis, right hip: Secondary | ICD-10-CM | POA: Diagnosis not present

## 2016-06-06 DIAGNOSIS — Z803 Family history of malignant neoplasm of breast: Secondary | ICD-10-CM | POA: Diagnosis not present

## 2016-06-06 DIAGNOSIS — Z1231 Encounter for screening mammogram for malignant neoplasm of breast: Secondary | ICD-10-CM | POA: Diagnosis not present

## 2016-06-12 DIAGNOSIS — R922 Inconclusive mammogram: Secondary | ICD-10-CM | POA: Diagnosis not present

## 2016-06-12 DIAGNOSIS — R928 Other abnormal and inconclusive findings on diagnostic imaging of breast: Secondary | ICD-10-CM | POA: Diagnosis not present

## 2016-06-12 DIAGNOSIS — N6001 Solitary cyst of right breast: Secondary | ICD-10-CM | POA: Diagnosis not present

## 2016-07-14 ENCOUNTER — Other Ambulatory Visit: Payer: Self-pay | Admitting: Internal Medicine

## 2016-07-14 DIAGNOSIS — E782 Mixed hyperlipidemia: Secondary | ICD-10-CM | POA: Diagnosis not present

## 2016-07-14 DIAGNOSIS — I10 Essential (primary) hypertension: Secondary | ICD-10-CM | POA: Diagnosis not present

## 2016-07-14 DIAGNOSIS — Z0001 Encounter for general adult medical examination with abnormal findings: Secondary | ICD-10-CM | POA: Diagnosis not present

## 2016-07-14 DIAGNOSIS — F17210 Nicotine dependence, cigarettes, uncomplicated: Secondary | ICD-10-CM | POA: Diagnosis not present

## 2016-07-14 DIAGNOSIS — I779 Disorder of arteries and arterioles, unspecified: Secondary | ICD-10-CM | POA: Diagnosis not present

## 2016-07-14 DIAGNOSIS — Z79899 Other long term (current) drug therapy: Secondary | ICD-10-CM | POA: Diagnosis not present

## 2016-07-14 DIAGNOSIS — Z1389 Encounter for screening for other disorder: Secondary | ICD-10-CM | POA: Diagnosis not present

## 2016-07-14 DIAGNOSIS — R0982 Postnasal drip: Secondary | ICD-10-CM | POA: Diagnosis not present

## 2016-07-14 DIAGNOSIS — E559 Vitamin D deficiency, unspecified: Secondary | ICD-10-CM | POA: Diagnosis not present

## 2016-07-14 DIAGNOSIS — I739 Peripheral vascular disease, unspecified: Secondary | ICD-10-CM | POA: Diagnosis not present

## 2016-07-14 DIAGNOSIS — Z23 Encounter for immunization: Secondary | ICD-10-CM | POA: Diagnosis not present

## 2016-07-14 DIAGNOSIS — J449 Chronic obstructive pulmonary disease, unspecified: Secondary | ICD-10-CM | POA: Diagnosis not present

## 2016-07-17 ENCOUNTER — Ambulatory Visit
Admission: RE | Admit: 2016-07-17 | Discharge: 2016-07-17 | Disposition: A | Discharge status: Home / Self Care | Payer: Medicare Other | Source: Ambulatory Visit | Attending: Internal Medicine | Admitting: Internal Medicine

## 2016-07-17 DIAGNOSIS — F17210 Nicotine dependence, cigarettes, uncomplicated: Secondary | ICD-10-CM

## 2016-07-17 DIAGNOSIS — Z87891 Personal history of nicotine dependence: Secondary | ICD-10-CM | POA: Diagnosis not present

## 2016-07-28 ENCOUNTER — Ambulatory Visit (INDEPENDENT_AMBULATORY_CARE_PROVIDER_SITE_OTHER): Payer: Medicare Other | Admitting: Family Medicine

## 2016-07-28 VITALS — BP 134/74 | HR 77 | Temp 98.3°F | Resp 17 | Ht 60.5 in | Wt 114.0 lb

## 2016-07-28 DIAGNOSIS — F17200 Nicotine dependence, unspecified, uncomplicated: Secondary | ICD-10-CM | POA: Diagnosis not present

## 2016-07-28 DIAGNOSIS — J310 Chronic rhinitis: Secondary | ICD-10-CM

## 2016-07-28 DIAGNOSIS — J329 Chronic sinusitis, unspecified: Secondary | ICD-10-CM

## 2016-07-28 DIAGNOSIS — J438 Other emphysema: Secondary | ICD-10-CM

## 2016-07-28 DIAGNOSIS — J45909 Unspecified asthma, uncomplicated: Secondary | ICD-10-CM

## 2016-07-28 MED ORDER — ALBUTEROL SULFATE HFA 108 (90 BASE) MCG/ACT IN AERS: INHALATION_SPRAY | RESPIRATORY_TRACT | 0 refills | Status: DC

## 2016-07-28 MED ORDER — AMOXICILLIN-POT CLAVULANATE 875-125 MG PO TABS: 1.0000 | ORAL_TABLET | Freq: Two times a day (BID) | ORAL | 0 refills | Status: DC

## 2016-07-28 MED ORDER — FLUTICASONE PROPIONATE 50 MCG/ACT NA SUSP: 2.0000 | Freq: Every day | NASAL | 0 refills | Status: DC

## 2016-07-28 MED ORDER — PREDNISONE 20 MG PO TABS: ORAL_TABLET | ORAL | 0 refills | Status: DC

## 2016-07-28 NOTE — Patient Instructions (Addendum)
Take the prednisone 2 pills daily for 3 days, then 1 pill daily for 3 days. Usually best taken after breakfast, but I would take the first 2 pills today when you get the prescription.  Use the fluticasone nose spray 2 sprays each nostril twice daily for 3 days, then once daily  Take the Augmentin (amoxicillin/clavulanate) 875 mg one twice daily for antibiotic for infection  Use the albuterol inhaler 2 inhalations every 4-6 hours as needed for wheezing or coughing. Try not to exceed 4 times in 24 hours.  Continue to use your over-the-counter cough suppressant as needed  If you develop increase shortness of breath or fever or are generally getting worse please return or go to emergency room if necessary.    IF you received an x-ray today, you will receive an invoice from [facility]. Please contact [facility] at [phone] with questions or concerns regarding your invoice.   IF you received labwork today, you will receive an invoice from [employer]. Please contact [facility] at [phone] with questions or concerns regarding your invoice.   Our billing staff will not be able to assist you with questions regarding bills from these companies.  You will be contacted with the lab results as soon as they are available. The fastest way to get your results is to activate your My Chart account. Instructions are located on the last page of this paperwork. If you have not heard from [name] regarding the results in 2 weeks, please contact this office.

## 2016-07-28 NOTE — Progress Notes (Signed)
Patient ID: Elizabeth [name], female    DOB: [birth year]  Age: 75 y.o. MRN: [id number]  Chief Complaint  Patient presents with  . Sinus Problem  . Cough    Productive    Subjective:   Patient is here with a two-week history of sinus congestion. She's been getting worse, blowing and coughing more purulent phlegm. She is coughing harder. She had a CT scan of her lungs a couple of weeks ago which showed some mild emphysema. She is a cigarette smoker, smoking about 4 cigarettes a day. She has not been smoking as much since she has been ill. She works a job helping run a business. She did not go into work today. She has a house here in a house in [facility] and goes back and forth of the 2 places. She has a history of getting these episodes about once a year. She has allergy problems. Azithromycin is helped her in the past.  Current allergies, medications, problem list, past/family and social histories reviewed.  Objective:  BP 134/74 (BP Location: Right Arm, Patient Position: Sitting, Cuff Size: Large)   Pulse 77   Temp 98.3 F (36.8 C) (Oral)   Resp 17   Ht 5' 0.5" (1.537 m)   Wt 114 lb (51.7 kg)   SpO2 95%   BMI 21.90 kg/m   Pleasant lady short of breath and coughing. Her TMs are normal. Nose slightly congested. Some tenderness over the sinuses. Throat clear. Neck supple without significant nodes. Chest has wheezing, primarily in the right lower lung. He heart regular without murmurs.  Assessment & Plan:   Assessment: 1. Rhinosinusitis   2. Asthmatic bronchitis, unspecified asthma severity, uncomplicated   3. Other emphysema ([place])   4. Tobacco use disorder       Plan: Because she just had the chest x-ray and this really did not have the rales of a pneumonia I decided to treat for asthmatic bronchitis in a patient with emphysema. If she is getting worse she will need a chest x-ray. Instructions explained to the patient.  No orders of the defined types were placed in this  encounter.   Meds ordered this encounter  Medications  . famotidine (PEPCID) 20 MG tablet    Sig: Take 20 mg by mouth 2 (two) times daily.         Patient Instructions   Take the prednisone 2 pills daily for 3 days, then 1 pill daily for 3 days. Usually best taken after breakfast, but I would take the first 2 pills today when you get the prescription.  Use the fluticasone nose spray 2 sprays each nostril twice daily for 3 days, then once daily  Take the Augmentin (amoxicillin/clavulanate) 875 mg one twice daily for antibiotic for infection  Use the albuterol inhaler 2 inhalations every 4-6 hours as needed for wheezing or coughing. Try not to exceed 4 times in 24 hours.  Continue to use your over-the-counter cough suppressant as needed  If you develop increase shortness of breath or fever or are generally getting worse please return or go to emergency room if necessary.    IF you received an x-ray today, you will receive an invoice from [facility]. Please contact [facility] at [phone] with questions or concerns regarding your invoice.   IF you received labwork today, you will receive an invoice from [employer]. Please contact [facility] at [phone] with questions or concerns regarding your invoice.   Our billing staff will not be  able to assist you with questions regarding bills from these companies.  You will be contacted with the lab results as soon as they are available. The fastest way to get your results is to activate your My Chart account. Instructions are located on the last page of this paperwork. If you have not heard from [name] regarding the results in 2 weeks, please contact this office.         No Follow-up on file.   Angelica Wix, MD 07/28/2016

## 2016-08-05 DIAGNOSIS — M8588 Other specified disorders of bone density and structure, other site: Secondary | ICD-10-CM | POA: Diagnosis not present

## 2016-08-05 DIAGNOSIS — M810 Age-related osteoporosis without current pathological fracture: Secondary | ICD-10-CM | POA: Diagnosis not present

## 2016-08-25 ENCOUNTER — Other Ambulatory Visit: Payer: Self-pay

## 2016-08-25 DIAGNOSIS — J45909 Unspecified asthma, uncomplicated: Secondary | ICD-10-CM

## 2016-08-25 DIAGNOSIS — J329 Chronic sinusitis, unspecified: Secondary | ICD-10-CM

## 2016-08-25 DIAGNOSIS — J310 Chronic rhinitis: Secondary | ICD-10-CM

## 2016-08-25 MED ORDER — FLUTICASONE PROPIONATE 50 MCG/ACT NA SUSP: 2.0000 | Freq: Every day | NASAL | 0 refills | Status: DC

## 2016-08-25 NOTE — Telephone Encounter (Signed)
07/2016 last ov 

## 2016-08-26 DIAGNOSIS — J329 Chronic sinusitis, unspecified: Secondary | ICD-10-CM

## 2016-08-26 DIAGNOSIS — J45909 Unspecified asthma, uncomplicated: Secondary | ICD-10-CM

## 2016-08-26 DIAGNOSIS — J310 Chronic rhinitis: Secondary | ICD-10-CM

## 2016-09-22 ENCOUNTER — Other Ambulatory Visit: Payer: Self-pay | Admitting: Physician Assistant

## 2016-09-22 DIAGNOSIS — J329 Chronic sinusitis, unspecified: Secondary | ICD-10-CM

## 2016-09-22 DIAGNOSIS — J45909 Unspecified asthma, uncomplicated: Secondary | ICD-10-CM

## 2016-09-22 DIAGNOSIS — J310 Chronic rhinitis: Secondary | ICD-10-CM

## 2017-02-04 ENCOUNTER — Other Ambulatory Visit: Payer: Self-pay | Admitting: Internal Medicine

## 2017-02-04 DIAGNOSIS — G629 Polyneuropathy, unspecified: Secondary | ICD-10-CM | POA: Diagnosis not present

## 2017-02-04 DIAGNOSIS — M25551 Pain in right hip: Secondary | ICD-10-CM | POA: Diagnosis not present

## 2017-02-04 DIAGNOSIS — I10 Essential (primary) hypertension: Secondary | ICD-10-CM | POA: Diagnosis not present

## 2017-02-04 DIAGNOSIS — R1013 Epigastric pain: Secondary | ICD-10-CM

## 2017-02-04 DIAGNOSIS — I779 Disorder of arteries and arterioles, unspecified: Secondary | ICD-10-CM | POA: Diagnosis not present

## 2017-02-04 DIAGNOSIS — I739 Peripheral vascular disease, unspecified: Secondary | ICD-10-CM | POA: Diagnosis not present

## 2017-02-04 DIAGNOSIS — J449 Chronic obstructive pulmonary disease, unspecified: Secondary | ICD-10-CM | POA: Diagnosis not present

## 2017-02-04 DIAGNOSIS — E782 Mixed hyperlipidemia: Secondary | ICD-10-CM | POA: Diagnosis not present

## 2017-02-04 DIAGNOSIS — E559 Vitamin D deficiency, unspecified: Secondary | ICD-10-CM | POA: Diagnosis not present

## 2017-02-04 DIAGNOSIS — Z79899 Other long term (current) drug therapy: Secondary | ICD-10-CM | POA: Diagnosis not present

## 2017-02-04 DIAGNOSIS — M2550 Pain in unspecified joint: Secondary | ICD-10-CM | POA: Diagnosis not present

## 2017-02-04 DIAGNOSIS — F17210 Nicotine dependence, cigarettes, uncomplicated: Secondary | ICD-10-CM | POA: Diagnosis not present

## 2017-02-06 ENCOUNTER — Other Ambulatory Visit: Payer: Medicare Other

## 2017-02-11 DIAGNOSIS — Z1211 Encounter for screening for malignant neoplasm of colon: Secondary | ICD-10-CM | POA: Diagnosis not present

## 2017-02-11 DIAGNOSIS — Z1212 Encounter for screening for malignant neoplasm of rectum: Secondary | ICD-10-CM | POA: Diagnosis not present

## 2017-03-05 ENCOUNTER — Ambulatory Visit
Admission: RE | Admit: 2017-03-05 | Discharge: 2017-03-05 | Disposition: A | Discharge status: Home / Self Care | Payer: Medicare Other | Source: Ambulatory Visit | Attending: Internal Medicine | Admitting: Internal Medicine

## 2017-03-05 ENCOUNTER — Other Ambulatory Visit: Payer: Self-pay | Admitting: Internal Medicine

## 2017-03-05 DIAGNOSIS — K2100 Gastro-esophageal reflux disease with esophagitis, without bleeding: Secondary | ICD-10-CM

## 2017-03-05 DIAGNOSIS — R1013 Epigastric pain: Secondary | ICD-10-CM

## 2017-03-09 ENCOUNTER — Ambulatory Visit
Admission: RE | Admit: 2017-03-09 | Discharge: 2017-03-09 | Disposition: A | Discharge status: Home / Self Care | Payer: Medicare Other | Source: Ambulatory Visit | Attending: Internal Medicine | Admitting: Internal Medicine

## 2017-03-09 DIAGNOSIS — R1013 Epigastric pain: Secondary | ICD-10-CM

## 2017-03-09 DIAGNOSIS — K30 Functional dyspepsia: Secondary | ICD-10-CM | POA: Diagnosis not present

## 2017-03-19 DIAGNOSIS — F17210 Nicotine dependence, cigarettes, uncomplicated: Secondary | ICD-10-CM | POA: Diagnosis not present

## 2017-03-19 DIAGNOSIS — E782 Mixed hyperlipidemia: Secondary | ICD-10-CM | POA: Diagnosis not present

## 2017-03-19 DIAGNOSIS — M2550 Pain in unspecified joint: Secondary | ICD-10-CM | POA: Diagnosis not present

## 2017-03-19 DIAGNOSIS — G629 Polyneuropathy, unspecified: Secondary | ICD-10-CM | POA: Diagnosis not present

## 2017-03-19 DIAGNOSIS — J449 Chronic obstructive pulmonary disease, unspecified: Secondary | ICD-10-CM | POA: Diagnosis not present

## 2017-03-19 DIAGNOSIS — E559 Vitamin D deficiency, unspecified: Secondary | ICD-10-CM | POA: Diagnosis not present

## 2017-03-19 DIAGNOSIS — M25551 Pain in right hip: Secondary | ICD-10-CM | POA: Diagnosis not present

## 2017-03-19 DIAGNOSIS — I10 Essential (primary) hypertension: Secondary | ICD-10-CM | POA: Diagnosis not present

## 2017-03-19 DIAGNOSIS — Z79899 Other long term (current) drug therapy: Secondary | ICD-10-CM | POA: Diagnosis not present

## 2017-03-19 DIAGNOSIS — I739 Peripheral vascular disease, unspecified: Secondary | ICD-10-CM | POA: Diagnosis not present

## 2017-03-19 DIAGNOSIS — I779 Disorder of arteries and arterioles, unspecified: Secondary | ICD-10-CM | POA: Diagnosis not present

## 2017-03-19 DIAGNOSIS — R1013 Epigastric pain: Secondary | ICD-10-CM | POA: Diagnosis not present

## 2017-05-21 DIAGNOSIS — H6503 Acute serous otitis media, bilateral: Secondary | ICD-10-CM | POA: Diagnosis not present

## 2017-05-21 DIAGNOSIS — R0981 Nasal congestion: Secondary | ICD-10-CM | POA: Diagnosis not present

## 2017-05-21 DIAGNOSIS — J0100 Acute maxillary sinusitis, unspecified: Secondary | ICD-10-CM | POA: Diagnosis not present

## 2017-05-25 DIAGNOSIS — J309 Allergic rhinitis, unspecified: Secondary | ICD-10-CM | POA: Diagnosis not present

## 2017-05-25 DIAGNOSIS — H66011 Acute suppurative otitis media with spontaneous rupture of ear drum, right ear: Secondary | ICD-10-CM | POA: Diagnosis not present

## 2017-05-25 DIAGNOSIS — R112 Nausea with vomiting, unspecified: Secondary | ICD-10-CM | POA: Diagnosis not present

## 2017-05-25 DIAGNOSIS — K2900 Acute gastritis without bleeding: Secondary | ICD-10-CM | POA: Diagnosis not present

## 2017-05-25 DIAGNOSIS — I10 Essential (primary) hypertension: Secondary | ICD-10-CM | POA: Diagnosis not present

## 2017-06-08 ENCOUNTER — Ambulatory Visit
Admission: RE | Admit: 2017-06-08 | Discharge: 2017-06-08 | Disposition: A | Discharge status: Home / Self Care | Payer: Medicare Other | Source: Ambulatory Visit | Attending: Internal Medicine | Admitting: Internal Medicine

## 2017-06-08 ENCOUNTER — Other Ambulatory Visit: Payer: Self-pay | Admitting: Internal Medicine

## 2017-06-08 DIAGNOSIS — H66014 Acute suppurative otitis media with spontaneous rupture of ear drum, recurrent, right ear: Secondary | ICD-10-CM

## 2017-06-08 DIAGNOSIS — R51 Headache: Secondary | ICD-10-CM | POA: Diagnosis not present

## 2017-06-16 DIAGNOSIS — Z1231 Encounter for screening mammogram for malignant neoplasm of breast: Secondary | ICD-10-CM | POA: Diagnosis not present

## 2017-06-22 DIAGNOSIS — H6691 Otitis media, unspecified, right ear: Secondary | ICD-10-CM | POA: Diagnosis not present

## 2017-06-22 DIAGNOSIS — H6521 Chronic serous otitis media, right ear: Secondary | ICD-10-CM | POA: Diagnosis not present

## 2017-06-22 DIAGNOSIS — H6061 Unspecified chronic otitis externa, right ear: Secondary | ICD-10-CM | POA: Diagnosis not present

## 2017-06-22 DIAGNOSIS — H6121 Impacted cerumen, right ear: Secondary | ICD-10-CM | POA: Diagnosis not present

## 2017-06-24 DIAGNOSIS — H6521 Chronic serous otitis media, right ear: Secondary | ICD-10-CM | POA: Diagnosis not present

## 2017-06-24 DIAGNOSIS — H6691 Otitis media, unspecified, right ear: Secondary | ICD-10-CM | POA: Diagnosis not present

## 2017-06-24 DIAGNOSIS — H6061 Unspecified chronic otitis externa, right ear: Secondary | ICD-10-CM | POA: Diagnosis not present

## 2017-07-15 DIAGNOSIS — H6521 Chronic serous otitis media, right ear: Secondary | ICD-10-CM | POA: Diagnosis not present

## 2017-07-16 DIAGNOSIS — Z1389 Encounter for screening for other disorder: Secondary | ICD-10-CM | POA: Diagnosis not present

## 2017-07-16 DIAGNOSIS — M810 Age-related osteoporosis without current pathological fracture: Secondary | ICD-10-CM | POA: Diagnosis not present

## 2017-07-16 DIAGNOSIS — Z0001 Encounter for general adult medical examination with abnormal findings: Secondary | ICD-10-CM | POA: Diagnosis not present

## 2017-07-16 DIAGNOSIS — E782 Mixed hyperlipidemia: Secondary | ICD-10-CM | POA: Diagnosis not present

## 2017-07-16 DIAGNOSIS — Z23 Encounter for immunization: Secondary | ICD-10-CM | POA: Diagnosis not present

## 2017-07-16 DIAGNOSIS — Z79899 Other long term (current) drug therapy: Secondary | ICD-10-CM | POA: Diagnosis not present

## 2017-07-16 DIAGNOSIS — F17210 Nicotine dependence, cigarettes, uncomplicated: Secondary | ICD-10-CM | POA: Diagnosis not present

## 2017-07-16 DIAGNOSIS — I10 Essential (primary) hypertension: Secondary | ICD-10-CM | POA: Diagnosis not present

## 2017-07-16 DIAGNOSIS — E559 Vitamin D deficiency, unspecified: Secondary | ICD-10-CM | POA: Diagnosis not present

## 2017-07-16 DIAGNOSIS — I739 Peripheral vascular disease, unspecified: Secondary | ICD-10-CM | POA: Diagnosis not present

## 2017-07-16 DIAGNOSIS — M2550 Pain in unspecified joint: Secondary | ICD-10-CM | POA: Diagnosis not present

## 2017-07-16 DIAGNOSIS — G629 Polyneuropathy, unspecified: Secondary | ICD-10-CM | POA: Diagnosis not present

## 2017-07-16 DIAGNOSIS — J449 Chronic obstructive pulmonary disease, unspecified: Secondary | ICD-10-CM | POA: Diagnosis not present

## 2017-07-16 DIAGNOSIS — Z1211 Encounter for screening for malignant neoplasm of colon: Secondary | ICD-10-CM | POA: Diagnosis not present

## 2017-07-24 ENCOUNTER — Other Ambulatory Visit (HOSPITAL_COMMUNITY): Payer: Self-pay | Admitting: *Deleted

## 2017-07-27 ENCOUNTER — Ambulatory Visit (HOSPITAL_COMMUNITY)
Admission: RE | Admit: 2017-07-27 | Discharge: 2017-07-27 | Disposition: A | Discharge status: Home / Self Care | Payer: Medicare Other | Source: Ambulatory Visit | Attending: Internal Medicine | Admitting: Internal Medicine

## 2017-07-27 ENCOUNTER — Encounter (HOSPITAL_COMMUNITY): Payer: Self-pay

## 2017-07-27 DIAGNOSIS — M810 Age-related osteoporosis without current pathological fracture: Secondary | ICD-10-CM | POA: Diagnosis not present

## 2017-07-27 MED ORDER — ZOLEDRONIC ACID 5 MG/100ML IV SOLN
INTRAVENOUS | Status: AC
  Administered 2017-07-27: 5 mg
  Filled 2017-07-27: qty 100

## 2017-10-30 DIAGNOSIS — I10 Essential (primary) hypertension: Secondary | ICD-10-CM | POA: Diagnosis not present

## 2017-10-30 DIAGNOSIS — M810 Age-related osteoporosis without current pathological fracture: Secondary | ICD-10-CM | POA: Diagnosis not present

## 2017-10-30 DIAGNOSIS — E782 Mixed hyperlipidemia: Secondary | ICD-10-CM | POA: Diagnosis not present

## 2017-10-30 DIAGNOSIS — M1990 Unspecified osteoarthritis, unspecified site: Secondary | ICD-10-CM | POA: Diagnosis not present

## 2017-10-30 DIAGNOSIS — J449 Chronic obstructive pulmonary disease, unspecified: Secondary | ICD-10-CM | POA: Diagnosis not present

## 2017-12-07 DIAGNOSIS — H6521 Chronic serous otitis media, right ear: Secondary | ICD-10-CM | POA: Diagnosis not present

## 2017-12-07 DIAGNOSIS — H6501 Acute serous otitis media, right ear: Secondary | ICD-10-CM | POA: Diagnosis not present

## 2017-12-07 DIAGNOSIS — H6061 Unspecified chronic otitis externa, right ear: Secondary | ICD-10-CM | POA: Diagnosis not present

## 2017-12-07 DIAGNOSIS — H6121 Impacted cerumen, right ear: Secondary | ICD-10-CM | POA: Diagnosis not present

## 2017-12-14 DIAGNOSIS — H903 Sensorineural hearing loss, bilateral: Secondary | ICD-10-CM | POA: Diagnosis not present

## 2017-12-14 DIAGNOSIS — H9311 Tinnitus, right ear: Secondary | ICD-10-CM | POA: Diagnosis not present

## 2017-12-14 DIAGNOSIS — H9313 Tinnitus, bilateral: Secondary | ICD-10-CM | POA: Diagnosis not present

## 2018-01-14 DIAGNOSIS — E782 Mixed hyperlipidemia: Secondary | ICD-10-CM | POA: Diagnosis not present

## 2018-01-14 DIAGNOSIS — I779 Disorder of arteries and arterioles, unspecified: Secondary | ICD-10-CM | POA: Diagnosis not present

## 2018-01-14 DIAGNOSIS — J449 Chronic obstructive pulmonary disease, unspecified: Secondary | ICD-10-CM | POA: Diagnosis not present

## 2018-01-14 DIAGNOSIS — Z79899 Other long term (current) drug therapy: Secondary | ICD-10-CM | POA: Diagnosis not present

## 2018-01-14 DIAGNOSIS — K219 Gastro-esophageal reflux disease without esophagitis: Secondary | ICD-10-CM | POA: Diagnosis not present

## 2018-01-14 DIAGNOSIS — M1990 Unspecified osteoarthritis, unspecified site: Secondary | ICD-10-CM | POA: Diagnosis not present

## 2018-01-14 DIAGNOSIS — R112 Nausea with vomiting, unspecified: Secondary | ICD-10-CM | POA: Diagnosis not present

## 2018-01-14 DIAGNOSIS — I10 Essential (primary) hypertension: Secondary | ICD-10-CM | POA: Diagnosis not present

## 2018-01-14 DIAGNOSIS — M2550 Pain in unspecified joint: Secondary | ICD-10-CM | POA: Diagnosis not present

## 2018-01-14 DIAGNOSIS — G629 Polyneuropathy, unspecified: Secondary | ICD-10-CM | POA: Diagnosis not present

## 2018-01-14 DIAGNOSIS — H671 Otitis media in diseases classified elsewhere, right ear: Secondary | ICD-10-CM | POA: Diagnosis not present

## 2018-01-14 DIAGNOSIS — M810 Age-related osteoporosis without current pathological fracture: Secondary | ICD-10-CM | POA: Diagnosis not present

## 2018-02-12 DIAGNOSIS — J449 Chronic obstructive pulmonary disease, unspecified: Secondary | ICD-10-CM | POA: Diagnosis not present

## 2018-02-12 DIAGNOSIS — E782 Mixed hyperlipidemia: Secondary | ICD-10-CM | POA: Diagnosis not present

## 2018-02-12 DIAGNOSIS — M1990 Unspecified osteoarthritis, unspecified site: Secondary | ICD-10-CM | POA: Diagnosis not present

## 2018-02-12 DIAGNOSIS — M810 Age-related osteoporosis without current pathological fracture: Secondary | ICD-10-CM | POA: Diagnosis not present

## 2018-02-12 DIAGNOSIS — I10 Essential (primary) hypertension: Secondary | ICD-10-CM | POA: Diagnosis not present

## 2018-02-15 DIAGNOSIS — H6691 Otitis media, unspecified, right ear: Secondary | ICD-10-CM | POA: Diagnosis not present

## 2018-02-15 DIAGNOSIS — H6121 Impacted cerumen, right ear: Secondary | ICD-10-CM | POA: Diagnosis not present

## 2018-02-15 DIAGNOSIS — H6061 Unspecified chronic otitis externa, right ear: Secondary | ICD-10-CM | POA: Diagnosis not present

## 2018-02-15 DIAGNOSIS — H6521 Chronic serous otitis media, right ear: Secondary | ICD-10-CM | POA: Diagnosis not present

## 2018-02-19 DIAGNOSIS — H11153 Pinguecula, bilateral: Secondary | ICD-10-CM | POA: Diagnosis not present

## 2018-02-19 DIAGNOSIS — Z961 Presence of intraocular lens: Secondary | ICD-10-CM | POA: Diagnosis not present

## 2018-02-19 DIAGNOSIS — H18463 Peripheral corneal degeneration, bilateral: Secondary | ICD-10-CM | POA: Diagnosis not present

## 2018-02-19 DIAGNOSIS — Z9841 Cataract extraction status, right eye: Secondary | ICD-10-CM | POA: Diagnosis not present

## 2018-02-19 DIAGNOSIS — H52223 Regular astigmatism, bilateral: Secondary | ICD-10-CM | POA: Diagnosis not present

## 2018-02-19 DIAGNOSIS — H524 Presbyopia: Secondary | ICD-10-CM | POA: Diagnosis not present

## 2018-02-19 DIAGNOSIS — H353111 Nonexudative age-related macular degeneration, right eye, early dry stage: Secondary | ICD-10-CM | POA: Diagnosis not present

## 2018-02-19 DIAGNOSIS — H26492 Other secondary cataract, left eye: Secondary | ICD-10-CM | POA: Diagnosis not present

## 2018-02-19 DIAGNOSIS — H5212 Myopia, left eye: Secondary | ICD-10-CM | POA: Diagnosis not present

## 2018-02-19 DIAGNOSIS — H04123 Dry eye syndrome of bilateral lacrimal glands: Secondary | ICD-10-CM | POA: Diagnosis not present

## 2018-02-19 DIAGNOSIS — H35371 Puckering of macula, right eye: Secondary | ICD-10-CM | POA: Diagnosis not present

## 2018-02-19 DIAGNOSIS — H5201 Hypermetropia, right eye: Secondary | ICD-10-CM | POA: Diagnosis not present

## 2018-02-25 DIAGNOSIS — H6521 Chronic serous otitis media, right ear: Secondary | ICD-10-CM | POA: Diagnosis not present

## 2018-02-25 DIAGNOSIS — H6061 Unspecified chronic otitis externa, right ear: Secondary | ICD-10-CM | POA: Diagnosis not present

## 2018-02-25 DIAGNOSIS — H6121 Impacted cerumen, right ear: Secondary | ICD-10-CM | POA: Diagnosis not present

## 2018-02-25 DIAGNOSIS — H6691 Otitis media, unspecified, right ear: Secondary | ICD-10-CM | POA: Diagnosis not present

## 2018-03-08 DIAGNOSIS — H6061 Unspecified chronic otitis externa, right ear: Secondary | ICD-10-CM | POA: Diagnosis not present

## 2018-03-08 DIAGNOSIS — H6121 Impacted cerumen, right ear: Secondary | ICD-10-CM | POA: Diagnosis not present

## 2018-03-08 DIAGNOSIS — H7101 Cholesteatoma of attic, right ear: Secondary | ICD-10-CM | POA: Diagnosis not present

## 2018-03-10 ENCOUNTER — Encounter (INDEPENDENT_AMBULATORY_CARE_PROVIDER_SITE_OTHER): Payer: Medicare Other | Admitting: Ophthalmology

## 2018-03-10 DIAGNOSIS — D3132 Benign neoplasm of left choroid: Secondary | ICD-10-CM

## 2018-03-10 DIAGNOSIS — I10 Essential (primary) hypertension: Secondary | ICD-10-CM | POA: Diagnosis not present

## 2018-03-10 DIAGNOSIS — H43821 Vitreomacular adhesion, right eye: Secondary | ICD-10-CM | POA: Diagnosis not present

## 2018-03-10 DIAGNOSIS — H43813 Vitreous degeneration, bilateral: Secondary | ICD-10-CM | POA: Diagnosis not present

## 2018-03-10 DIAGNOSIS — H35033 Hypertensive retinopathy, bilateral: Secondary | ICD-10-CM | POA: Diagnosis not present

## 2018-03-23 DIAGNOSIS — H6691 Otitis media, unspecified, right ear: Secondary | ICD-10-CM | POA: Diagnosis not present

## 2018-03-23 DIAGNOSIS — H7191 Unspecified cholesteatoma, right ear: Secondary | ICD-10-CM | POA: Diagnosis not present

## 2018-03-23 DIAGNOSIS — H7011 Chronic mastoiditis, right ear: Secondary | ICD-10-CM | POA: Diagnosis not present

## 2018-03-23 DIAGNOSIS — H6061 Unspecified chronic otitis externa, right ear: Secondary | ICD-10-CM | POA: Diagnosis not present

## 2018-03-23 DIAGNOSIS — H6121 Impacted cerumen, right ear: Secondary | ICD-10-CM | POA: Diagnosis not present

## 2018-03-24 ENCOUNTER — Other Ambulatory Visit (HOSPITAL_COMMUNITY): Payer: Self-pay | Admitting: Otolaryngology

## 2018-03-24 DIAGNOSIS — H7101 Cholesteatoma of attic, right ear: Secondary | ICD-10-CM

## 2018-03-31 ENCOUNTER — Ambulatory Visit (HOSPITAL_COMMUNITY)
Admission: RE | Admit: 2018-03-31 | Discharge: 2018-03-31 | Disposition: A | Discharge status: Home / Self Care | Payer: Medicare Other | Source: Ambulatory Visit | Attending: Otolaryngology | Admitting: Otolaryngology

## 2018-03-31 DIAGNOSIS — H7101 Cholesteatoma of attic, right ear: Secondary | ICD-10-CM | POA: Diagnosis not present

## 2018-04-02 DIAGNOSIS — H6501 Acute serous otitis media, right ear: Secondary | ICD-10-CM | POA: Diagnosis not present

## 2018-04-02 DIAGNOSIS — H7011 Chronic mastoiditis, right ear: Secondary | ICD-10-CM | POA: Diagnosis not present

## 2018-04-02 DIAGNOSIS — H9311 Tinnitus, right ear: Secondary | ICD-10-CM | POA: Diagnosis not present

## 2018-04-02 DIAGNOSIS — H6061 Unspecified chronic otitis externa, right ear: Secondary | ICD-10-CM | POA: Diagnosis not present

## 2018-04-02 DIAGNOSIS — H903 Sensorineural hearing loss, bilateral: Secondary | ICD-10-CM | POA: Diagnosis not present

## 2018-04-02 DIAGNOSIS — H6063 Unspecified chronic otitis externa, bilateral: Secondary | ICD-10-CM | POA: Diagnosis not present

## 2018-04-02 DIAGNOSIS — H6122 Impacted cerumen, left ear: Secondary | ICD-10-CM | POA: Diagnosis not present

## 2018-04-02 DIAGNOSIS — H7101 Cholesteatoma of attic, right ear: Secondary | ICD-10-CM | POA: Diagnosis not present

## 2018-04-02 DIAGNOSIS — H6691 Otitis media, unspecified, right ear: Secondary | ICD-10-CM | POA: Diagnosis not present

## 2018-04-02 DIAGNOSIS — H7191 Unspecified cholesteatoma, right ear: Secondary | ICD-10-CM | POA: Diagnosis not present

## 2018-04-02 DIAGNOSIS — H6521 Chronic serous otitis media, right ear: Secondary | ICD-10-CM | POA: Diagnosis not present

## 2018-04-09 DIAGNOSIS — H6501 Acute serous otitis media, right ear: Secondary | ICD-10-CM | POA: Diagnosis not present

## 2018-04-09 DIAGNOSIS — H6061 Unspecified chronic otitis externa, right ear: Secondary | ICD-10-CM | POA: Diagnosis not present

## 2018-04-09 DIAGNOSIS — H6121 Impacted cerumen, right ear: Secondary | ICD-10-CM | POA: Diagnosis not present

## 2018-04-21 DIAGNOSIS — L570 Actinic keratosis: Secondary | ICD-10-CM | POA: Diagnosis not present

## 2018-04-21 DIAGNOSIS — B079 Viral wart, unspecified: Secondary | ICD-10-CM | POA: Diagnosis not present

## 2018-04-21 DIAGNOSIS — L821 Other seborrheic keratosis: Secondary | ICD-10-CM | POA: Diagnosis not present

## 2018-04-21 DIAGNOSIS — L820 Inflamed seborrheic keratosis: Secondary | ICD-10-CM | POA: Diagnosis not present

## 2018-04-21 DIAGNOSIS — D485 Neoplasm of uncertain behavior of skin: Secondary | ICD-10-CM | POA: Diagnosis not present

## 2018-04-21 DIAGNOSIS — D225 Melanocytic nevi of trunk: Secondary | ICD-10-CM | POA: Diagnosis not present

## 2018-04-22 DIAGNOSIS — J410 Simple chronic bronchitis: Secondary | ICD-10-CM | POA: Diagnosis not present

## 2018-04-22 DIAGNOSIS — J320 Chronic maxillary sinusitis: Secondary | ICD-10-CM | POA: Diagnosis not present

## 2018-04-22 DIAGNOSIS — J322 Chronic ethmoidal sinusitis: Secondary | ICD-10-CM | POA: Diagnosis not present

## 2018-04-22 DIAGNOSIS — J029 Acute pharyngitis, unspecified: Secondary | ICD-10-CM | POA: Diagnosis not present

## 2018-04-23 DIAGNOSIS — D485 Neoplasm of uncertain behavior of skin: Secondary | ICD-10-CM | POA: Diagnosis not present

## 2018-04-23 DIAGNOSIS — B079 Viral wart, unspecified: Secondary | ICD-10-CM | POA: Diagnosis not present

## 2018-04-24 DIAGNOSIS — J209 Acute bronchitis, unspecified: Secondary | ICD-10-CM | POA: Diagnosis not present

## 2018-04-24 DIAGNOSIS — R05 Cough: Secondary | ICD-10-CM | POA: Diagnosis not present

## 2018-04-24 DIAGNOSIS — J22 Unspecified acute lower respiratory infection: Secondary | ICD-10-CM | POA: Diagnosis not present

## 2018-04-28 DIAGNOSIS — M810 Age-related osteoporosis without current pathological fracture: Secondary | ICD-10-CM | POA: Diagnosis not present

## 2018-04-28 DIAGNOSIS — E782 Mixed hyperlipidemia: Secondary | ICD-10-CM | POA: Diagnosis not present

## 2018-04-28 DIAGNOSIS — M1990 Unspecified osteoarthritis, unspecified site: Secondary | ICD-10-CM | POA: Diagnosis not present

## 2018-04-28 DIAGNOSIS — J449 Chronic obstructive pulmonary disease, unspecified: Secondary | ICD-10-CM | POA: Diagnosis not present

## 2018-04-28 DIAGNOSIS — I10 Essential (primary) hypertension: Secondary | ICD-10-CM | POA: Diagnosis not present

## 2018-04-28 DIAGNOSIS — R413 Other amnesia: Secondary | ICD-10-CM | POA: Diagnosis not present

## 2018-04-28 DIAGNOSIS — J989 Respiratory disorder, unspecified: Secondary | ICD-10-CM | POA: Diagnosis not present

## 2018-04-28 DIAGNOSIS — R634 Abnormal weight loss: Secondary | ICD-10-CM | POA: Diagnosis not present

## 2018-06-17 DIAGNOSIS — Z1231 Encounter for screening mammogram for malignant neoplasm of breast: Secondary | ICD-10-CM | POA: Diagnosis not present

## 2018-07-19 DIAGNOSIS — R5383 Other fatigue: Secondary | ICD-10-CM | POA: Diagnosis not present

## 2018-07-19 DIAGNOSIS — Z6820 Body mass index (BMI) 20.0-20.9, adult: Secondary | ICD-10-CM | POA: Diagnosis not present

## 2018-07-19 DIAGNOSIS — J019 Acute sinusitis, unspecified: Secondary | ICD-10-CM | POA: Diagnosis not present

## 2018-07-26 DIAGNOSIS — Z79899 Other long term (current) drug therapy: Secondary | ICD-10-CM | POA: Diagnosis not present

## 2018-07-26 DIAGNOSIS — R413 Other amnesia: Secondary | ICD-10-CM | POA: Diagnosis not present

## 2018-07-26 DIAGNOSIS — R634 Abnormal weight loss: Secondary | ICD-10-CM | POA: Diagnosis not present

## 2018-07-26 DIAGNOSIS — Z0000 Encounter for general adult medical examination without abnormal findings: Secondary | ICD-10-CM | POA: Diagnosis not present

## 2018-07-26 DIAGNOSIS — J989 Respiratory disorder, unspecified: Secondary | ICD-10-CM | POA: Diagnosis not present

## 2018-07-26 DIAGNOSIS — M1990 Unspecified osteoarthritis, unspecified site: Secondary | ICD-10-CM | POA: Diagnosis not present

## 2018-07-26 DIAGNOSIS — Z23 Encounter for immunization: Secondary | ICD-10-CM | POA: Diagnosis not present

## 2018-07-26 DIAGNOSIS — I10 Essential (primary) hypertension: Secondary | ICD-10-CM | POA: Diagnosis not present

## 2018-07-26 DIAGNOSIS — R0989 Other specified symptoms and signs involving the circulatory and respiratory systems: Secondary | ICD-10-CM | POA: Diagnosis not present

## 2018-07-26 DIAGNOSIS — M810 Age-related osteoporosis without current pathological fracture: Secondary | ICD-10-CM | POA: Diagnosis not present

## 2018-07-26 DIAGNOSIS — J449 Chronic obstructive pulmonary disease, unspecified: Secondary | ICD-10-CM | POA: Diagnosis not present

## 2018-07-26 DIAGNOSIS — E782 Mixed hyperlipidemia: Secondary | ICD-10-CM | POA: Diagnosis not present

## 2018-07-26 DIAGNOSIS — E559 Vitamin D deficiency, unspecified: Secondary | ICD-10-CM | POA: Diagnosis not present

## 2018-07-26 DIAGNOSIS — Z1389 Encounter for screening for other disorder: Secondary | ICD-10-CM | POA: Diagnosis not present

## 2018-07-26 DIAGNOSIS — I6529 Occlusion and stenosis of unspecified carotid artery: Secondary | ICD-10-CM | POA: Diagnosis not present

## 2018-07-29 ENCOUNTER — Other Ambulatory Visit: Payer: Self-pay | Admitting: Internal Medicine

## 2018-07-29 DIAGNOSIS — R0989 Other specified symptoms and signs involving the circulatory and respiratory systems: Secondary | ICD-10-CM

## 2018-08-03 ENCOUNTER — Ambulatory Visit
Admission: RE | Admit: 2018-08-03 | Discharge: 2018-08-03 | Disposition: A | Discharge status: Home / Self Care | Payer: Medicare Other | Source: Ambulatory Visit | Attending: Internal Medicine | Admitting: Internal Medicine

## 2018-08-03 DIAGNOSIS — I6523 Occlusion and stenosis of bilateral carotid arteries: Secondary | ICD-10-CM | POA: Diagnosis not present

## 2018-08-03 DIAGNOSIS — R0989 Other specified symptoms and signs involving the circulatory and respiratory systems: Secondary | ICD-10-CM

## 2018-08-26 DIAGNOSIS — M810 Age-related osteoporosis without current pathological fracture: Secondary | ICD-10-CM | POA: Diagnosis not present

## 2018-09-02 DIAGNOSIS — I10 Essential (primary) hypertension: Secondary | ICD-10-CM | POA: Diagnosis not present

## 2018-09-02 DIAGNOSIS — J449 Chronic obstructive pulmonary disease, unspecified: Secondary | ICD-10-CM | POA: Diagnosis not present

## 2018-09-02 DIAGNOSIS — E782 Mixed hyperlipidemia: Secondary | ICD-10-CM | POA: Diagnosis not present

## 2018-09-02 DIAGNOSIS — M1990 Unspecified osteoarthritis, unspecified site: Secondary | ICD-10-CM | POA: Diagnosis not present

## 2018-09-02 DIAGNOSIS — M810 Age-related osteoporosis without current pathological fracture: Secondary | ICD-10-CM | POA: Diagnosis not present

## 2018-09-08 DIAGNOSIS — M810 Age-related osteoporosis without current pathological fracture: Secondary | ICD-10-CM | POA: Diagnosis not present

## 2018-09-13 ENCOUNTER — Ambulatory Visit (HOSPITAL_COMMUNITY)
Admission: RE | Admit: 2018-09-13 | Discharge: 2018-09-13 | Disposition: A | Discharge status: Home / Self Care | Payer: Medicare Other | Source: Ambulatory Visit | Attending: Internal Medicine | Admitting: Internal Medicine

## 2018-09-13 DIAGNOSIS — M810 Age-related osteoporosis without current pathological fracture: Secondary | ICD-10-CM | POA: Diagnosis not present

## 2018-09-13 MED ORDER — ZOLEDRONIC ACID 5 MG/100ML IV SOLN
5.0000 mg | Freq: Once | INTRAVENOUS | Status: AC
  Administered 2018-09-13: 5 mg via INTRAVENOUS

## 2018-09-13 MED ORDER — ZOLEDRONIC ACID 5 MG/100ML IV SOLN
INTRAVENOUS | Status: AC
  Administered 2018-09-13: 5 mg via INTRAVENOUS
  Filled 2018-09-13: qty 100

## 2019-01-25 DIAGNOSIS — M1990 Unspecified osteoarthritis, unspecified site: Secondary | ICD-10-CM | POA: Diagnosis not present

## 2019-01-25 DIAGNOSIS — J449 Chronic obstructive pulmonary disease, unspecified: Secondary | ICD-10-CM | POA: Diagnosis not present

## 2019-01-25 DIAGNOSIS — E782 Mixed hyperlipidemia: Secondary | ICD-10-CM | POA: Diagnosis not present

## 2019-01-25 DIAGNOSIS — M5431 Sciatica, right side: Secondary | ICD-10-CM | POA: Diagnosis not present

## 2019-01-25 DIAGNOSIS — I10 Essential (primary) hypertension: Secondary | ICD-10-CM | POA: Diagnosis not present

## 2019-01-25 DIAGNOSIS — M810 Age-related osteoporosis without current pathological fracture: Secondary | ICD-10-CM | POA: Diagnosis not present

## 2019-03-16 ENCOUNTER — Encounter (INDEPENDENT_AMBULATORY_CARE_PROVIDER_SITE_OTHER): Payer: Medicare Other | Admitting: Ophthalmology

## 2019-05-07 ENCOUNTER — Encounter (HOSPITAL_COMMUNITY): Payer: Self-pay

## 2019-05-07 ENCOUNTER — Emergency Department (HOSPITAL_COMMUNITY)
Admission: EM | Admit: 2019-05-07 | Discharge: 2019-05-07 | Disposition: A | Discharge status: Home / Self Care | Payer: Medicare Other | Attending: Emergency Medicine | Admitting: Emergency Medicine

## 2019-05-07 ENCOUNTER — Other Ambulatory Visit: Payer: Self-pay

## 2019-05-07 DIAGNOSIS — Z20828 Contact with and (suspected) exposure to other viral communicable diseases: Secondary | ICD-10-CM | POA: Diagnosis not present

## 2019-05-07 DIAGNOSIS — F17210 Nicotine dependence, cigarettes, uncomplicated: Secondary | ICD-10-CM | POA: Diagnosis not present

## 2019-05-07 DIAGNOSIS — J449 Chronic obstructive pulmonary disease, unspecified: Secondary | ICD-10-CM | POA: Diagnosis not present

## 2019-05-07 DIAGNOSIS — J0100 Acute maxillary sinusitis, unspecified: Secondary | ICD-10-CM | POA: Diagnosis not present

## 2019-05-07 DIAGNOSIS — M5431 Sciatica, right side: Secondary | ICD-10-CM | POA: Insufficient documentation

## 2019-05-07 DIAGNOSIS — R51 Headache: Secondary | ICD-10-CM | POA: Diagnosis not present

## 2019-05-07 DIAGNOSIS — B379 Candidiasis, unspecified: Secondary | ICD-10-CM | POA: Diagnosis not present

## 2019-05-07 DIAGNOSIS — Z79899 Other long term (current) drug therapy: Secondary | ICD-10-CM | POA: Diagnosis not present

## 2019-05-07 DIAGNOSIS — B370 Candidal stomatitis: Secondary | ICD-10-CM

## 2019-05-07 DIAGNOSIS — M545 Low back pain: Secondary | ICD-10-CM | POA: Diagnosis present

## 2019-05-07 LAB — COMPREHENSIVE METABOLIC PANEL
ALT: 15 U/L (ref 0–44)
AST: 20 U/L (ref 15–41)
Albumin: 3.6 g/dL (ref 3.5–5.0)
Alkaline Phosphatase: 56 U/L (ref 38–126)
Anion gap: 11 (ref 5–15)
BUN: 14 mg/dL (ref 8–23)
CO2: 25 mmol/L (ref 22–32)
Calcium: 9.1 mg/dL (ref 8.9–10.3)
Chloride: 99 mmol/L (ref 98–111)
Creatinine, Ser: 0.87 mg/dL (ref 0.44–1.00)
GFR calc Af Amer: >60 mL/min (ref >60)
GFR calc non Af Amer: >60 mL/min (ref >60)
Glucose, Bld: 95 mg/dL (ref 70–99)
Potassium: 3.9 mmol/L (ref 3.5–5.1)
Sodium: 135 mmol/L (ref 135–145)
Total Bilirubin: 1.5 mg/dL — ABNORMAL HIGH (ref 0.3–1.2)
Total Protein: 6.6 g/dL (ref 6.5–8.1)

## 2019-05-07 LAB — CBC WITH DIFFERENTIAL/PLATELET
Abs Immature Granulocytes: 0.06 10*3/uL (ref 0.00–0.07)
Basophils Absolute: 0 10*3/uL (ref 0.0–0.1)
Basophils Relative: 0 %
Eosinophils Absolute: 0.2 10*3/uL (ref 0.0–0.5)
Eosinophils Relative: 2 %
HCT: 43.3 % (ref 36.0–46.0)
Hemoglobin: 14.5 g/dL (ref 12.0–15.0)
Immature Granulocytes: 1 %
Lymphocytes Relative: 28 %
Lymphs Abs: 3.4 10*3/uL (ref 0.7–4.0)
MCH: 32.1 pg (ref 26.0–34.0)
MCHC: 33.5 g/dL (ref 30.0–36.0)
MCV: 95.8 fL (ref 80.0–100.0)
Monocytes Absolute: 0.9 10*3/uL (ref 0.1–1.0)
Monocytes Relative: 7 %
Neutro Abs: 7.8 10*3/uL — ABNORMAL HIGH (ref 1.7–7.7)
Neutrophils Relative %: 62 %
Platelets: 246 10*3/uL (ref 150–400)
RBC: 4.52 MIL/uL (ref 3.87–5.11)
RDW: 13.2 % (ref 11.5–15.5)
WBC: 12.4 10*3/uL — ABNORMAL HIGH (ref 4.0–10.5)
nRBC: 0 % (ref 0.0–0.2)

## 2019-05-07 MED ORDER — NYSTATIN 100000 UNIT/ML MT SUSP: 500000.0000 [IU] | Freq: Four times a day (QID) | OROMUCOSAL | 0 refills | Status: DC

## 2019-05-07 MED ORDER — ACETAMINOPHEN 500 MG PO TABS
1000.0000 mg | ORAL_TABLET | Freq: Once | ORAL | Status: AC
Start: 2019-05-07 — End: 2019-05-07
  Administered 2019-05-07: 14:00:00 1000 mg via ORAL
  Filled 2019-05-07: qty 2

## 2019-05-07 MED ORDER — AMOXICILLIN-POT CLAVULANATE 875-125 MG PO TABS: 1.0000 | ORAL_TABLET | Freq: Two times a day (BID) | ORAL | 0 refills | Status: DC

## 2019-05-07 NOTE — ED Provider Notes (Signed)
[facility] EMERGENCY DEPARTMENT Provider Note   CSN: [id number] Arrival date & time: 05/07/19  1202    History   Chief Complaint Chief Complaint  Patient presents with  . Back Pain  . Sore Throat    HPI Elizabeth [name] is a 78 y.o. female.     78 yo F with multiple complaints.  Patient had been seen about a week or so ago and at that time had worsening of her sciatica.  Was started on a taper of prednisone which she is recently finished.  She has felt like the pain to her back is come back.  Denies loss of bowel or bladder denies loss of peritoneal sensation she denies any urinary symptoms.  Has chronic abdominal pain which is unchanged.  Complaining of a left frontal headache.  Also having a sore throat and now has a new coating on her tongue.  No fevers.  No cough.  No sick contacts.  She denies any new procedure on her back.  Denies trauma.  Denies unilateral weakness.  Does have tingling that is diffuse about bilateral lower extremities.  Is able to ambulate without difficulty.  The history is provided by the patient.  Back Pain Location:  Lumbar spine Quality:  Aching and shooting Radiates to:  R foot Pain severity:  Moderate Onset quality:  Gradual Duration:  2 weeks Timing:  Constant Progression:  Waxing and waning Chronicity:  Chronic Relieved by:  Nothing Worsened by:  Nothing Ineffective treatments:  None tried Associated symptoms: headaches   Associated symptoms: no chest pain, no dysuria and no fever   Sore Throat Associated symptoms include headaches. Pertinent negatives include no chest pain and no shortness of breath.    Past Medical History:  Diagnosis Date  . Abdominal cramping    possibly irritable bowel syndrome versus adhesions secondary to multiple surgeries and abdominal inflammation and 2009 secondary to diverticulitis and diverticular phlegmon  . Allergic rhinitis   . Arthritis   . Carotid artery disease ([place])   . Cataract    . Chest pain syndrome 2004   with adenosine Cardiolite that was normal  . Chronic hoarseness   . COPD (chronic obstructive pulmonary disease) ([place])   . GERD (gastroesophageal reflux disease)   . History of renal calculi   . Hypercholesterolemia   . Hypertension   . Incontinence    Cough incontinence  . Mild depression ([place])   . Osteopenia   . Peripheral vascular disease ([place])    with femoral and carotid bruits  . Tobacco dependence   . Vitamin D deficiency     There are no active problems to display for this patient.   Past Surgical History:  Procedure Laterality Date  . BREAST BIOPSY    . COSMETIC SURGERY     on neck and eyes  . DILATION AND CURETTAGE OF UTERUS    . FEMUR SURGERY  12/2007   left femur surgery--for femoral neck fracture status post fall   . LIPOSUCTION    . OTHER SURGICAL HISTORY     sigmoid colectomy  . SHOULDER SURGERY     arthroscopic shoulder surgery   . TUBAL LIGATION       OB History   No obstetric history on file.      Home Medications    Prior to Admission medications   Medication Sig Start Date End Date Taking? Authorizing Provider  albuterol (PROVENTIL HFA;VENTOLIN HFA) 108 (90 Base) MCG/ACT inhaler 1-2 puffs every 6 hours  as needed for wheezing and coughing 07/28/16   Posey Boyer, MD  amoxicillin-clavulanate (AUGMENTIN) 875-125 MG tablet Take 1 tablet by mouth 2 (two) times daily. 05/07/19   Deno Etienne, DO  aspirin 81 MG tablet Take 81 mg by mouth every other day. Aspir-81 81 MG Tablet Delayed Release 1 tablet every other day    [provider]  celecoxib (CELEBREX) 200 MG capsule Take 200 mg by mouth daily.    [provider]  Cholecalciferol (VITAMIN D-3 PO) Take 2,000 Units by mouth as directed.    [provider]  cloNIDine (CATAPRES) 0.1 MG tablet Take 0.1 mg by mouth 2 (two) times daily.    [provider]  dicyclomine (BENTYL) 10 MG capsule Take 10 mg by mouth 4 (four) times daily as needed  for spasms. Bentyl 10 MG Capsule 1 capsule Four times a day prn abdominal caramping as needed    [provider]  famotidine (PEPCID) 20 MG tablet Take 20 mg by mouth 2 (two) times daily.    [provider]  fluticasone (FLONASE) 50 MCG/ACT nasal spray PLACE 2 SPRAYS INTO BOTH NOSTRILS DAILY. 09/26/16   Tereasa Coop, PA-C  gabapentin (NEURONTIN) 100 MG capsule Take 2 capsules (200 mg total) by mouth at bedtime. PATIENT NEEDS OFFICE VISIT FOR ADDITIONAL REFILLS 07/30/15   [name], MD  HYDROcodone-acetaminophen (NORCO) 5-325 MG per tablet Take 1 tablet by mouth every 6 (six) hours as needed. Patient not taking: Reported on 07/28/2016 12/24/14   [name], MD  ibuprofen (ADVIL,MOTRIN) 200 MG tablet Take 200 mg by mouth as directed. Ibuprofen 200 MG Tablet 2 tablets 3 times a day as needed for joint pain    [provider]  irbesartan-hydrochlorothiazide (AVALIDE) 150-12.5 MG per tablet Take 1 tablet by mouth daily.    [provider]  metoprolol succinate (TOPROL-XL) 25 MG 24 hr tablet Take 25 mg by mouth daily.    [provider]  montelukast (SINGULAIR) 10 MG tablet Take 10 mg by mouth at bedtime. Singulair 10 MG Tablet 1 tablet in the evening Once a day    [provider]  Multiple Vitamin (MULTIVITAMIN) tablet Take 1 tablet by mouth daily.    [provider]  nystatin (MYCOSTATIN) 100000 UNIT/ML suspension Take 5 mLs (500,000 Units total) by mouth 4 (four) times daily. 05/07/19   Deno Etienne, DO  Omega 3 1000 MG CAPS Take by mouth daily.    [provider]  omeprazole (PRILOSEC) 20 MG capsule Take 20 mg by mouth daily.    [provider]  predniSONE (DELTASONE) 20 MG tablet Take 2 daily for 3 days, then 1 daily for 3 days 07/28/16   Posey Boyer, MD  risedronate (ACTONEL) 150 MG tablet Take 150 mg by mouth every 30 (thirty) days. with water on empty stomach, nothing by mouth or lie down for next 30 minutes.     [provider]  simvastatin (ZOCOR) 40 MG tablet Take 40 mg by mouth daily.    [provider]    Family History Family History  Problem Relation Age of Onset  . Heart disease Mother   . Stroke Mother     Social History Social History   Tobacco Use  . Smoking status: Current Every Day Smoker    Packs/day: 0.50    Types: Cigarettes  . Smokeless tobacco: Never Used  Substance Use Topics  . Alcohol use: Yes    Alcohol/week: 0.0 standard drinks  Comment: Occ glass of wine  . Drug use: No     Allergies   Lisinopril, Other, Pollen extract, Tegretol [carbamazepine], and Tetracyclines & related   Review of Systems Review of Systems  Constitutional: Negative for chills and fever.  HENT: Positive for congestion and sore throat. Negative for rhinorrhea.   Eyes: Negative for redness and visual disturbance.  Respiratory: Negative for shortness of breath and wheezing.   Cardiovascular: Negative for chest pain and palpitations.  Gastrointestinal: Negative for nausea and vomiting.  Genitourinary: Negative for dysuria and urgency.  Musculoskeletal: Positive for back pain. Negative for arthralgias and myalgias.  Skin: Negative for pallor and wound.  Neurological: Positive for headaches. Negative for dizziness.     Physical Exam Updated Vital Signs BP (!) 146/61   Pulse 69   Resp 15   Ht 5\' 1"  (1.549 m)   Wt 49 kg   SpO2 97%   BMI 20.41 kg/m   Physical Exam Vitals signs and nursing note reviewed.  Constitutional:      General: She is not in acute distress.    Appearance: She is well-developed. She is not diaphoretic.  HENT:     Head: Normocephalic and atraumatic.     Comments: Thrush.  Bilateral effusion to the TMs.  Posterior oropharyngeal erythema without tonsillar swelling or exudates.  Tolerating secretions without difficulty.  Exquisitely tender to percussion to the left maxillary sinus. Eyes:     Pupils: Pupils are equal, round, and reactive  to light.  Neck:     Musculoskeletal: Normal range of motion and neck supple.  Cardiovascular:     Rate and Rhythm: Normal rate and regular rhythm.     Heart sounds: No murmur. No friction rub. No gallop.   Pulmonary:     Effort: Pulmonary effort is normal.     Breath sounds: No wheezing or rales.  Abdominal:     General: There is no distension.     Palpations: Abdomen is soft.     Tenderness: There is no abdominal tenderness.  Musculoskeletal:        General: Tenderness present.     Comments: Diffusely tender about the low back worse at the TL junction  Pulse motor and sensation is intact to the bilateral lower extremities.  Reflexes are 2+ and equal.  No clonus.  Ambulates with flip-flops.  Skin:    General: Skin is warm and dry.  Neurological:     Mental Status: She is alert and oriented to person, place, and time.  Psychiatric:        Behavior: Behavior normal.      ED Treatments / Results  Labs (all labs ordered are listed, but only abnormal results are displayed) Labs Reviewed  CBC WITH DIFFERENTIAL/PLATELET - Abnormal; Notable for the following components:      Result Value   WBC 12.4 (*)    Neutro Abs 7.8 (*)    All other components within normal limits  COMPREHENSIVE METABOLIC PANEL - Abnormal; Notable for the following components:   Total Bilirubin 1.5 (*)    All other components within normal limits  NOVEL CORONAVIRUS, NAA (HOSPITAL ORDER, SEND-OUT TO REF LAB)    EKG None  Radiology No results found.  Procedures Procedures (including critical care time)  Medications Ordered in ED Medications  acetaminophen (TYLENOL) tablet 1,000 mg (1,000 mg Oral Given 05/07/19 1344)     Initial Impression / Assessment and Plan / ED Course  I have reviewed the triage vital signs and the  nursing notes.  Pertinent labs & imaging results that were available during my care of the patient were reviewed by me and considered in my medical decision making (see chart for  details).        78 yo F with multiple complaints.  Patient has worsening of her chronic back pain.  Now has tingling to bilateral lower extremities sore throat and headache.  On exam the patient clinically has sinusitis.  She also has thrush that is likely from her recent steroid use.  Will obtain basic lab work to assess for electrolyte abnormality or new renal dysfunction with the diffuse paresthesias.  Started on antibiotics for sinusitis.  Nystatin for thrush.  I received old records from the PCP and reviewed them.  Patient's renal function is better than baseline.  She has a mild leukocytosis that I think is related to demargination from her recent prednisone use.  2:15 PM:  I have discussed the diagnosis/risks/treatment options with the patient and believe the pt to be eligible for discharge home to follow-up with PCP. We also discussed returning to the ED immediately if new or worsening sx occur. We discussed the sx which are most concerning (e.g., sudden worsening pain, fever, inability to tolerate by mouth, cauda equina s/sx) that necessitate immediate return. Medications administered to the patient during their visit and any new prescriptions provided to the patient are listed below.  Medications given during this visit Medications  acetaminophen (TYLENOL) tablet 1,000 mg (1,000 mg Oral Given 05/07/19 1344)     The patient appears reasonably screen and/or stabilized for discharge and I doubt any other medical condition or other [facility] requiring further screening, evaluation, or treatment in the ED at this time prior to discharge.    Final Clinical Impressions(s) / ED Diagnoses   Final diagnoses:  Sciatica of right side  Acute maxillary sinusitis, recurrence not specified  [place]    ED Discharge Orders         Ordered    amoxicillin-clavulanate (AUGMENTIN) 875-125 MG tablet  2 times daily     05/07/19 1348    nystatin (MYCOSTATIN) 100000 UNIT/ML suspension  4 times daily      05/07/19 Buras, Daelyn Pettaway, DO 05/07/19 1415

## 2019-05-07 NOTE — Discharge Instructions (Signed)
There is been a lot of research on back pain, unfortunately the only thing that seems to really help is Tylenol and NSAIDS(like mobic)  Relative rest is also important to not lift greater than 10 pounds bending or twisting at the waist.  Please follow-up with your family physician.  The other thing that really seems to benefit patients is physical therapy which your doctor may send you for.  Please return to the emergency department for new numbness or weakness to your arms or legs. Difficulty with urinating or urinating or pooping on yourself.  Also if you cannot feel toilet paper when you wipe or get a fever.   Also take tylenol 1000mg (2 extra strength) four times a day.

## 2019-05-07 NOTE — ED Triage Notes (Signed)
Back pain has been going on for a few weeks (that she is taking prednisone for) & it has flared up with an overall general discomfort. she has begun to have a sore throat gradually within the past week, she remains afebrile, a white & "fuzzy" feeling on her tongue.

## 2019-05-08 LAB — NOVEL CORONAVIRUS, NAA (HOSP ORDER, SEND-OUT TO REF LAB; TAT 18-24 HRS): SARS-CoV-2, NAA: NOT DETECTED

## 2019-06-20 ENCOUNTER — Other Ambulatory Visit: Payer: Self-pay

## 2019-06-20 ENCOUNTER — Encounter (INDEPENDENT_AMBULATORY_CARE_PROVIDER_SITE_OTHER): Payer: Medicare Other | Admitting: Ophthalmology

## 2019-06-20 DIAGNOSIS — H35033 Hypertensive retinopathy, bilateral: Secondary | ICD-10-CM | POA: Diagnosis not present

## 2019-06-20 DIAGNOSIS — D3132 Benign neoplasm of left choroid: Secondary | ICD-10-CM

## 2019-06-20 DIAGNOSIS — I10 Essential (primary) hypertension: Secondary | ICD-10-CM

## 2019-06-20 DIAGNOSIS — H43813 Vitreous degeneration, bilateral: Secondary | ICD-10-CM | POA: Diagnosis not present

## 2019-06-23 DIAGNOSIS — Z803 Family history of malignant neoplasm of breast: Secondary | ICD-10-CM | POA: Diagnosis not present

## 2019-06-23 DIAGNOSIS — Z1231 Encounter for screening mammogram for malignant neoplasm of breast: Secondary | ICD-10-CM | POA: Diagnosis not present

## 2019-08-08 DIAGNOSIS — Z23 Encounter for immunization: Secondary | ICD-10-CM | POA: Diagnosis not present

## 2019-08-08 DIAGNOSIS — M5431 Sciatica, right side: Secondary | ICD-10-CM | POA: Diagnosis not present

## 2019-08-08 DIAGNOSIS — Z79899 Other long term (current) drug therapy: Secondary | ICD-10-CM | POA: Diagnosis not present

## 2019-08-08 DIAGNOSIS — J449 Chronic obstructive pulmonary disease, unspecified: Secondary | ICD-10-CM | POA: Diagnosis not present

## 2019-08-08 DIAGNOSIS — I10 Essential (primary) hypertension: Secondary | ICD-10-CM | POA: Diagnosis not present

## 2019-08-08 DIAGNOSIS — Z1389 Encounter for screening for other disorder: Secondary | ICD-10-CM | POA: Diagnosis not present

## 2019-08-08 DIAGNOSIS — M1990 Unspecified osteoarthritis, unspecified site: Secondary | ICD-10-CM | POA: Diagnosis not present

## 2019-08-08 DIAGNOSIS — Z0001 Encounter for general adult medical examination with abnormal findings: Secondary | ICD-10-CM | POA: Diagnosis not present

## 2019-08-08 DIAGNOSIS — E782 Mixed hyperlipidemia: Secondary | ICD-10-CM | POA: Diagnosis not present

## 2019-08-08 DIAGNOSIS — M810 Age-related osteoporosis without current pathological fracture: Secondary | ICD-10-CM | POA: Diagnosis not present

## 2019-08-08 DIAGNOSIS — Z720 Tobacco use: Secondary | ICD-10-CM | POA: Diagnosis not present

## 2019-08-23 IMAGING — US US CAROTID DUPLEX BILAT
1 series · 13 of 24 positions shown · non-contrast
Comparison: 02/28/2016

CLINICAL DATA: Asymptomatic carotid bruit, hypertension

EXAM:
BILATERAL CAROTID DUPLEX ULTRASOUND
TECHNIQUE: Gray scale imaging, color Doppler and duplex ultrasound were
performed of bilateral carotid and vertebral arteries in the neck.

[Series 1: us carotid duplex bilat · 0.05mm/px · 13 of 48 slices shown]
[im 1/48]
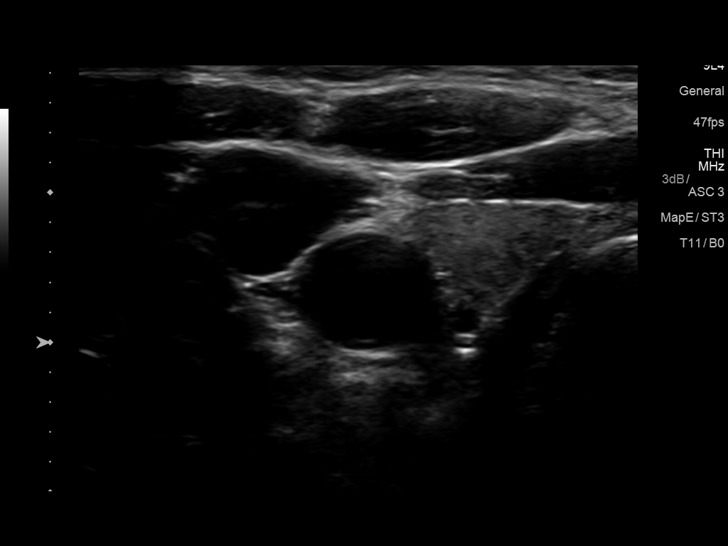
[im 5/48]
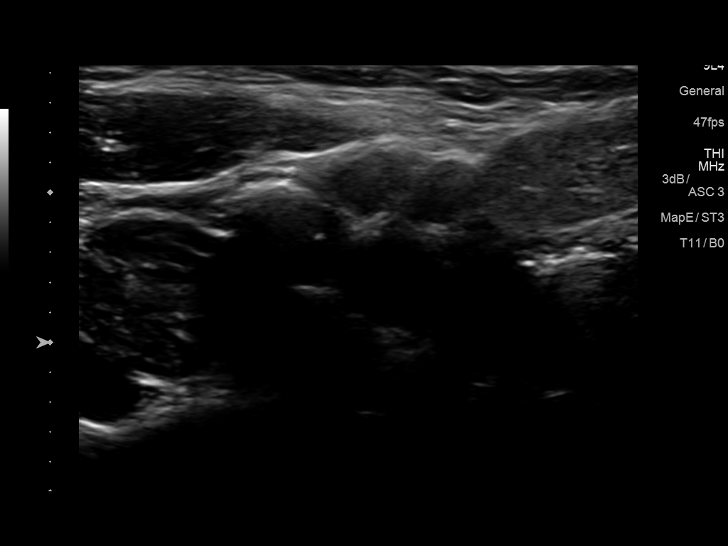
[im 9/48]
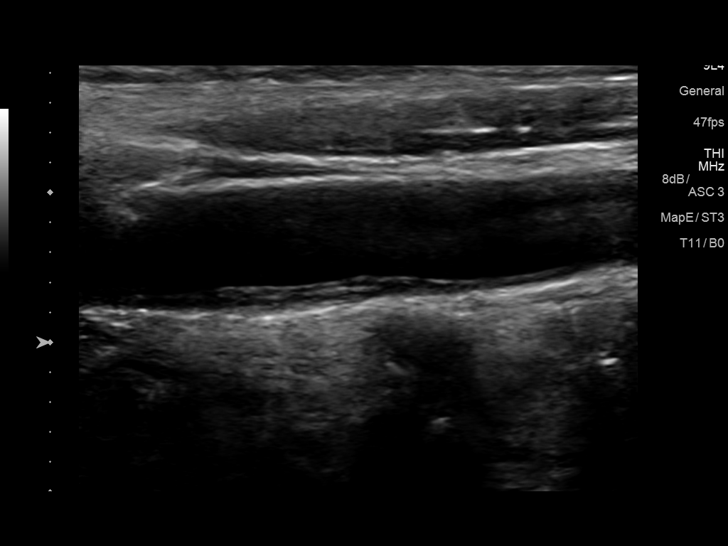
[im 13/48]
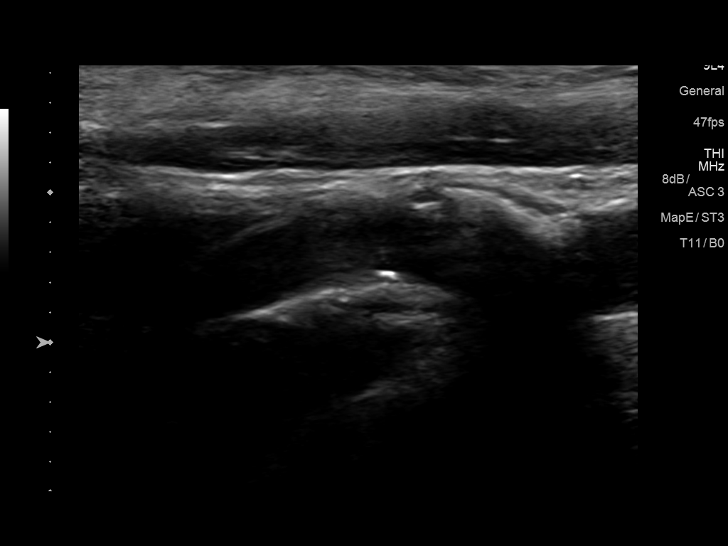
[im 17/48]
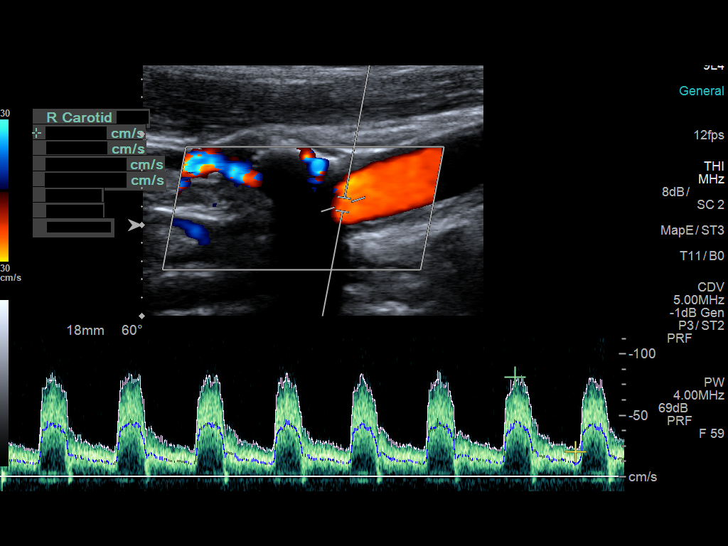
[im 21/48]
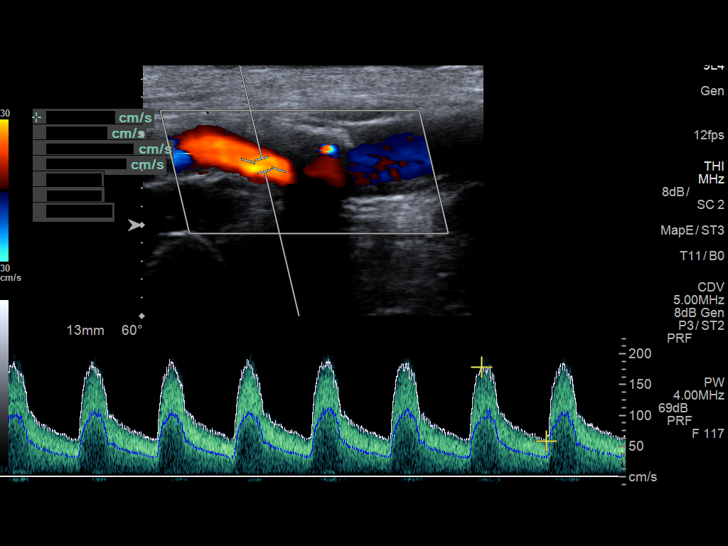
[im 25/48]
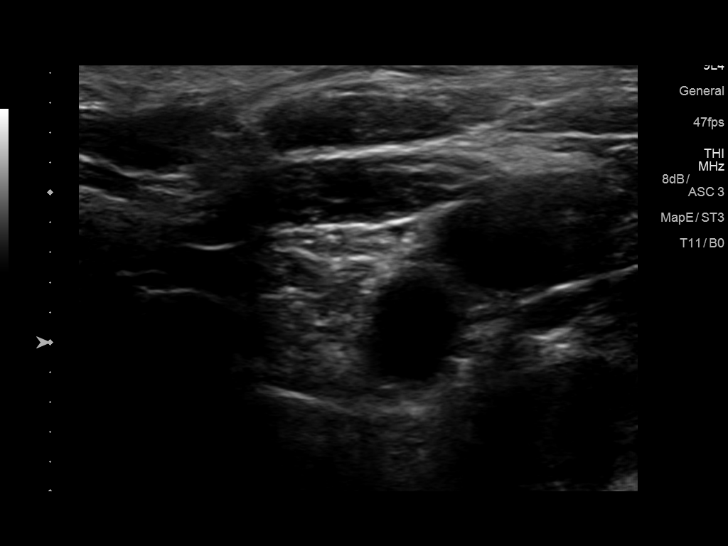
[im 27/48]
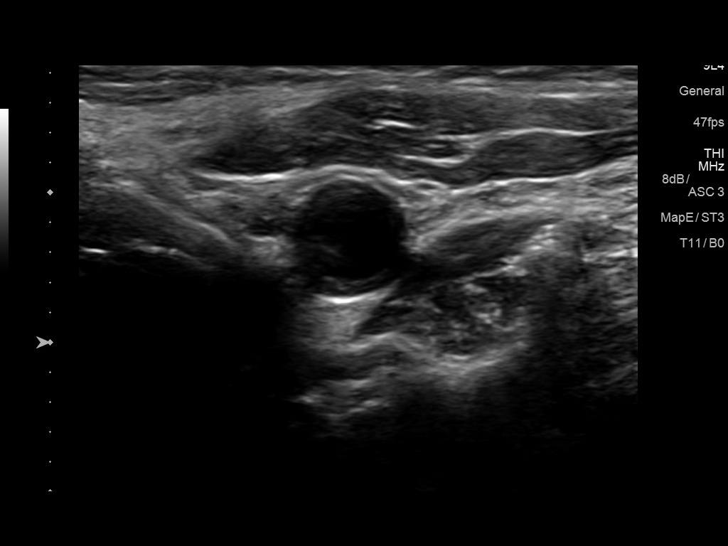
[im 31/48]
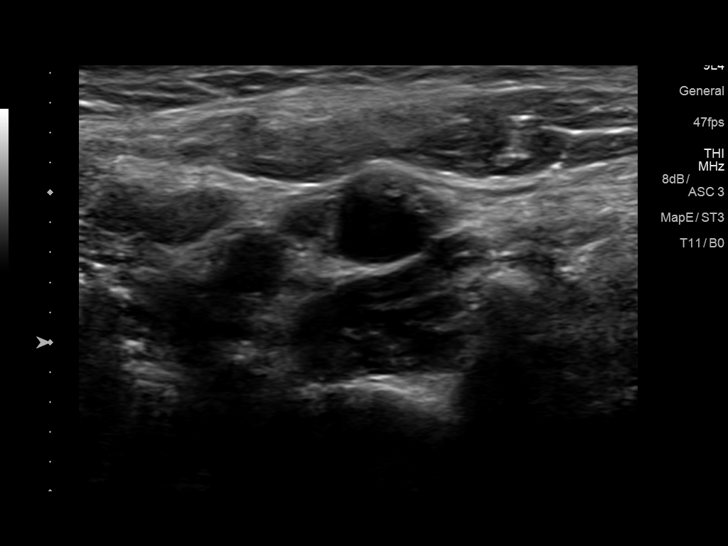
[im 35/48]
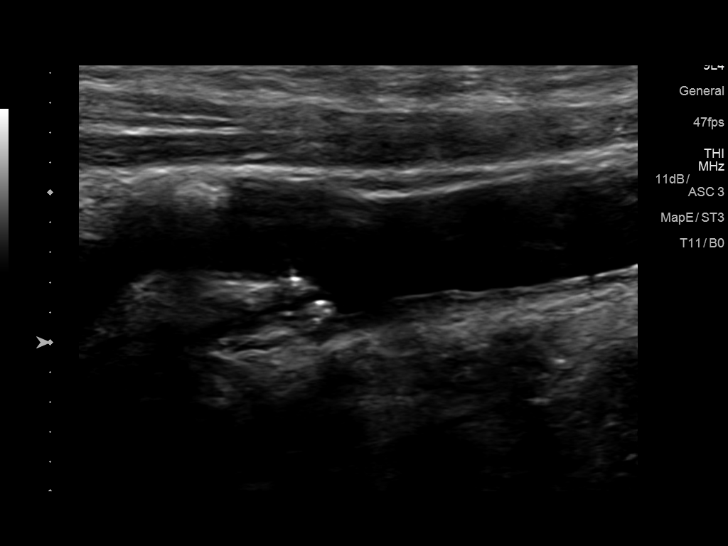
[im 39/48]
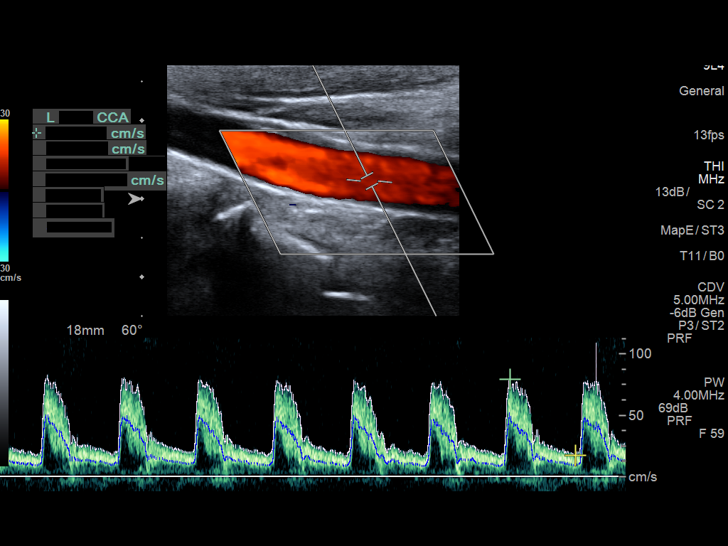
[im 43/48]
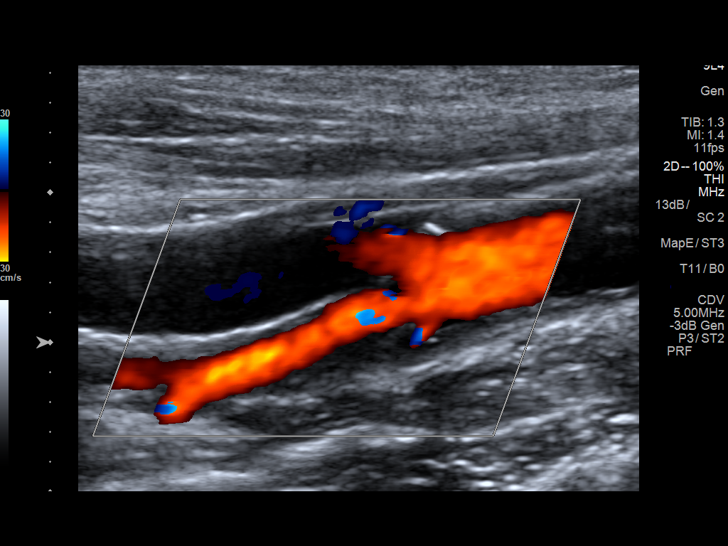
[im 48/48]
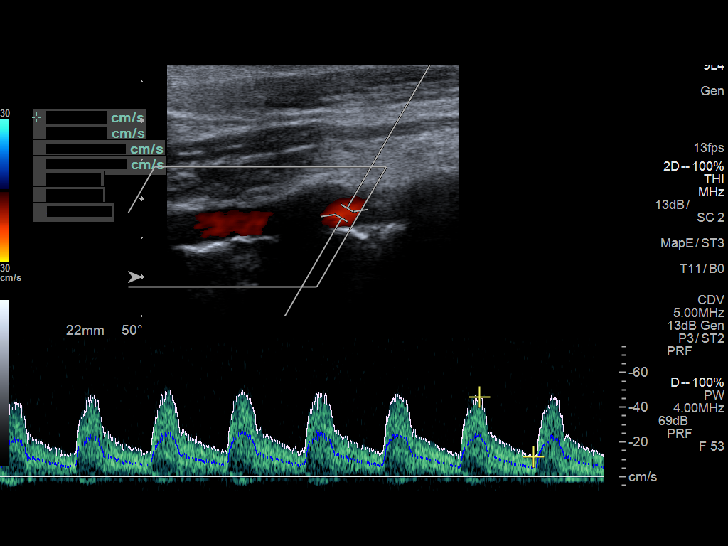

[13 of 24 positions shown; findings below may reference images not displayed]

FINDINGS: Criteria: Quantification of carotid stenosis is based on velocity
parameters that correlate the residual internal carotid diameter
with NASCET-based stenosis levels, using the diameter of the distal
internal carotid lumen as the denominator for stenosis measurement.

The following velocity measurements were obtained:

RIGHT

ICA: 193/68 cm/sec

CCA: 141/22 cm/sec

SYSTOLIC ICA/CCA RATIO:

ECA: 71 cm/sec

LEFT

ICA: 96/23 cm/sec

CCA: 80/21 cm/sec

SYSTOLIC ICA/CCA RATIO:

ECA: 64 cm/sec

RIGHT CAROTID ARTERY: Heterogeneous partially calcified right
carotid bifurcation atherosclerosis extending into the ICA origin.
Luminal narrowing in the proximal ICA by grayscale imaging. In this
region there is mild velocity elevation measuring 193/68
centimeters/second. Despite this, no significant turbulent flow. By
ultrasound criteria, right ICA stenosis estimated at 50-69%, but
closer to the 50% range.

RIGHT VERTEBRAL ARTERY:  Antegrade

LEFT CAROTID ARTERY: Similar scattered minor echogenic plaque
formation. No hemodynamically significant left ICA stenosis,
velocity elevation, or turbulent flow.

LEFT VERTEBRAL ARTERY:  Antegrade

Upper extremity blood pressures: RIGHT: 171/71 LEFT: 116/65
IMPRESSION: Right greater than left carotid atherosclerosis.

Moderate right ICA stenosis estimated at 50-69%.  See above comment.

Left ICA narrowing less than 50%.

Patent antegrade vertebral flow bilaterally

## 2019-12-18 ENCOUNTER — Ambulatory Visit: Payer: Medicare Other | Attending: Internal Medicine

## 2019-12-18 DIAGNOSIS — Z23 Encounter for immunization: Secondary | ICD-10-CM | POA: Insufficient documentation

## 2019-12-18 NOTE — Progress Notes (Signed)
   Covid-19 Vaccination Clinic  Name:  Elizabeth [name]    MRN: [id number] DOB: [birth year]  12/18/2019  [name] was observed post Covid-19 immunization for 30 minutes based on pre-vaccination screening without incidence. She was provided with Vaccine Information Sheet and instruction to access the V-Safe system.   [name] was instructed to call 911 with any severe reactions post vaccine: Marland Kitchen Difficulty breathing  . Swelling of your face and throat  . A fast heartbeat  . A bad rash all over your body  . Dizziness and weakness    Immunizations Administered    Name Date Dose VIS Date Route   Pfizer COVID-19 Vaccine 12/18/2019 10:10 AM 0.3 mL 10/14/2019 Intramuscular   Manufacturer: [place]   Lot: [id number]   [place]: [id number]

## 2020-01-10 ENCOUNTER — Ambulatory Visit: Payer: Medicare Other | Attending: Internal Medicine

## 2020-01-10 DIAGNOSIS — Z23 Encounter for immunization: Secondary | ICD-10-CM | POA: Insufficient documentation

## 2020-01-10 NOTE — Progress Notes (Signed)
   Covid-19 Vaccination Clinic  Name:  Elizabeth [name]    MRN: [id number] DOB: [birth year]  01/10/2020  Ms. Borchardt was observed post Covid-19 immunization for 15 minutes without incident. She was provided with Vaccine Information Sheet and instruction to access the V-Safe system.   Ms. Hammerman was instructed to call 911 with any severe reactions post vaccine: Marland Kitchen Difficulty breathing  . Swelling of face and throat  . A fast heartbeat  . A bad rash all over body  . Dizziness and weakness   Immunizations Administered    Name Date Dose VIS Date Route   Pfizer COVID-19 Vaccine 01/10/2020 11:31 AM 0.3 mL 10/14/2019 Intramuscular   Manufacturer: [place]   Lot: [id number]   [place]: [id number]

## 2020-02-14 DIAGNOSIS — I10 Essential (primary) hypertension: Secondary | ICD-10-CM | POA: Diagnosis not present

## 2020-02-14 DIAGNOSIS — Z720 Tobacco use: Secondary | ICD-10-CM | POA: Diagnosis not present

## 2020-02-14 DIAGNOSIS — E782 Mixed hyperlipidemia: Secondary | ICD-10-CM | POA: Diagnosis not present

## 2020-02-14 DIAGNOSIS — J449 Chronic obstructive pulmonary disease, unspecified: Secondary | ICD-10-CM | POA: Diagnosis not present

## 2020-02-14 DIAGNOSIS — Z79899 Other long term (current) drug therapy: Secondary | ICD-10-CM | POA: Diagnosis not present

## 2020-02-14 DIAGNOSIS — M810 Age-related osteoporosis without current pathological fracture: Secondary | ICD-10-CM | POA: Diagnosis not present

## 2020-02-14 DIAGNOSIS — M1990 Unspecified osteoarthritis, unspecified site: Secondary | ICD-10-CM | POA: Diagnosis not present

## 2020-02-14 DIAGNOSIS — M5431 Sciatica, right side: Secondary | ICD-10-CM | POA: Diagnosis not present

## 2020-02-21 DIAGNOSIS — M545 Low back pain: Secondary | ICD-10-CM | POA: Diagnosis not present

## 2020-02-21 DIAGNOSIS — J449 Chronic obstructive pulmonary disease, unspecified: Secondary | ICD-10-CM | POA: Diagnosis not present

## 2020-02-21 DIAGNOSIS — M5416 Radiculopathy, lumbar region: Secondary | ICD-10-CM | POA: Diagnosis not present

## 2020-02-21 DIAGNOSIS — I10 Essential (primary) hypertension: Secondary | ICD-10-CM | POA: Diagnosis not present

## 2020-03-02 DIAGNOSIS — M5136 Other intervertebral disc degeneration, lumbar region: Secondary | ICD-10-CM | POA: Insufficient documentation

## 2020-03-13 DIAGNOSIS — M5136 Other intervertebral disc degeneration, lumbar region: Secondary | ICD-10-CM | POA: Diagnosis not present

## 2020-03-27 DIAGNOSIS — M5416 Radiculopathy, lumbar region: Secondary | ICD-10-CM | POA: Diagnosis not present

## 2020-03-27 DIAGNOSIS — M545 Low back pain: Secondary | ICD-10-CM | POA: Diagnosis not present

## 2020-03-27 DIAGNOSIS — M5136 Other intervertebral disc degeneration, lumbar region: Secondary | ICD-10-CM | POA: Diagnosis not present

## 2020-04-11 ENCOUNTER — Other Ambulatory Visit: Payer: Self-pay

## 2020-04-11 ENCOUNTER — Emergency Department (HOSPITAL_COMMUNITY)
Admission: EM | Admit: 2020-04-11 | Discharge: 2020-04-11 | Disposition: A | Discharge status: Home / Self Care | Payer: Medicare Other | Attending: Emergency Medicine | Admitting: Emergency Medicine

## 2020-04-11 ENCOUNTER — Emergency Department (HOSPITAL_COMMUNITY): Payer: Medicare Other

## 2020-04-11 DIAGNOSIS — I10 Essential (primary) hypertension: Secondary | ICD-10-CM | POA: Insufficient documentation

## 2020-04-11 DIAGNOSIS — J449 Chronic obstructive pulmonary disease, unspecified: Secondary | ICD-10-CM | POA: Insufficient documentation

## 2020-04-11 DIAGNOSIS — Z7982 Long term (current) use of aspirin: Secondary | ICD-10-CM | POA: Insufficient documentation

## 2020-04-11 DIAGNOSIS — R1033 Periumbilical pain: Secondary | ICD-10-CM | POA: Diagnosis present

## 2020-04-11 DIAGNOSIS — K2970 Gastritis, unspecified, without bleeding: Secondary | ICD-10-CM | POA: Diagnosis not present

## 2020-04-11 DIAGNOSIS — I2510 Atherosclerotic heart disease of native coronary artery without angina pectoris: Secondary | ICD-10-CM | POA: Diagnosis not present

## 2020-04-11 DIAGNOSIS — R109 Unspecified abdominal pain: Secondary | ICD-10-CM | POA: Diagnosis not present

## 2020-04-11 DIAGNOSIS — Z79899 Other long term (current) drug therapy: Secondary | ICD-10-CM | POA: Insufficient documentation

## 2020-04-11 DIAGNOSIS — F17210 Nicotine dependence, cigarettes, uncomplicated: Secondary | ICD-10-CM | POA: Diagnosis not present

## 2020-04-11 DIAGNOSIS — K2900 Acute gastritis without bleeding: Secondary | ICD-10-CM

## 2020-04-11 LAB — URINALYSIS, ROUTINE W REFLEX MICROSCOPIC
Bilirubin Urine: NEGATIVE
Glucose, UA: NEGATIVE mg/dL
Hgb urine dipstick: NEGATIVE
Ketones, ur: NEGATIVE mg/dL
Leukocytes,Ua: NEGATIVE
Nitrite: NEGATIVE
Protein, ur: 30 mg/dL — AB
Specific Gravity, Urine: 1.016 (ref 1.005–1.030)
pH: 7 (ref 5.0–8.0)

## 2020-04-11 LAB — COMPREHENSIVE METABOLIC PANEL
ALT: 15 U/L (ref 0–44)
AST: 25 U/L (ref 15–41)
Albumin: 3.7 g/dL (ref 3.5–5.0)
Alkaline Phosphatase: 58 U/L (ref 38–126)
Anion gap: 16 — ABNORMAL HIGH (ref 5–15)
BUN: 10 mg/dL (ref 8–23)
CO2: 26 mmol/L (ref 22–32)
Calcium: 9.5 mg/dL (ref 8.9–10.3)
Chloride: 96 mmol/L — ABNORMAL LOW (ref 98–111)
Creatinine, Ser: 0.91 mg/dL (ref 0.44–1.00)
GFR calc Af Amer: >60 mL/min (ref >60)
GFR calc non Af Amer: >60 mL/min (ref >60)
Glucose, Bld: 144 mg/dL — ABNORMAL HIGH (ref 70–99)
Potassium: 3.3 mmol/L — ABNORMAL LOW (ref 3.5–5.1)
Sodium: 138 mmol/L (ref 135–145)
Total Bilirubin: 0.6 mg/dL (ref 0.3–1.2)
Total Protein: 6.6 g/dL (ref 6.5–8.1)

## 2020-04-11 LAB — CBC
HCT: 41.1 % (ref 36.0–46.0)
Hemoglobin: 13.5 g/dL (ref 12.0–15.0)
MCH: 31.5 pg (ref 26.0–34.0)
MCHC: 32.8 g/dL (ref 30.0–36.0)
MCV: 95.8 fL (ref 80.0–100.0)
Platelets: 243 10*3/uL (ref 150–400)
RBC: 4.29 MIL/uL (ref 3.87–5.11)
RDW: 12.2 % (ref 11.5–15.5)
WBC: 9.1 10*3/uL (ref 4.0–10.5)
nRBC: 0 % (ref 0.0–0.2)

## 2020-04-11 LAB — LIPASE, BLOOD: Lipase: 42 U/L (ref 11–51)

## 2020-04-11 MED ORDER — ONDANSETRON HCL 4 MG/2ML IJ SOLN
4.0000 mg | Freq: Once | INTRAMUSCULAR | Status: AC
  Administered 2020-04-11: 4 mg via INTRAVENOUS
  Filled 2020-04-11: qty 2

## 2020-04-11 MED ORDER — OMEPRAZOLE 20 MG PO CPDR
20.0000 mg | DELAYED_RELEASE_CAPSULE | Freq: Every day | ORAL | 0 refills | Status: DC
Start: 2020-04-11 — End: 2020-11-17

## 2020-04-11 MED ORDER — FENTANYL CITRATE (PF) 100 MCG/2ML IJ SOLN
50.0000 ug | Freq: Once | INTRAMUSCULAR | Status: AC
  Administered 2020-04-11: 50 ug via INTRAVENOUS
  Filled 2020-04-11: qty 2

## 2020-04-11 MED ORDER — SODIUM CHLORIDE 0.9 % IV BOLUS
1000.0000 mL | Freq: Once | INTRAVENOUS | Status: AC
  Administered 2020-04-11: 1000 mL via INTRAVENOUS

## 2020-04-11 MED ORDER — SODIUM CHLORIDE 0.9% FLUSH: 3.0000 mL | Freq: Once | INTRAVENOUS | Status: DC

## 2020-04-11 MED ORDER — IOHEXOL 300 MG/ML  SOLN
75.0000 mL | Freq: Once | INTRAMUSCULAR | Status: AC | PRN
  Administered 2020-04-11: 75 mL via INTRAVENOUS

## 2020-04-11 MED ORDER — SUCRALFATE 1 G PO TABS
1.0000 g | ORAL_TABLET | Freq: Three times a day (TID) | ORAL | 0 refills | Status: DC
Start: 2020-04-11 — End: 2020-11-17

## 2020-04-11 NOTE — ED Notes (Signed)
Patient verbalizes understanding of discharge instructions . Opportunity for questions and answers were provided . Armband removed by staff ,Pt discharged from ED. W/C  offered at D/C  and Declined W/C at D/C and was escorted to lobby by RN.  

## 2020-04-11 NOTE — ED Triage Notes (Signed)
Pt here for ten days of generalized abdominal pain, n/v. Hx diverticulosis, sts this feels like the same. Several days since last normal bowel movement, although did have a small one this morning.

## 2020-04-11 NOTE — ED Provider Notes (Signed)
[facility] EMERGENCY DEPARTMENT Provider Note   CSN: [id number] Arrival date & time: 04/11/20  1012     History Chief Complaint  Patient presents with  . Abdominal Pain    Elizabeth [name] is a 79 y.o. female.  The history is provided by the patient.  Abdominal Pain Pain location:  Periumbilical and LLQ Pain quality: aching and burning   Pain radiates to:  Does not radiate Pain severity:  Mild Onset quality:  Gradual Duration:  10 days Timing:  Constant Progression:  Worsening Chronicity:  New Context: previous surgery   Relieved by:  Nothing Worsened by:  Nothing Associated symptoms: nausea   Associated symptoms: no anorexia, no belching, no chest pain, no chills, no constipation, no cough, no diarrhea, no dysuria, no fatigue, no fever, no hematuria, no melena, no shortness of breath, no sore throat, no vaginal bleeding, no vaginal discharge and no vomiting   Risk factors: multiple surgeries        Past Medical History:  Diagnosis Date  . Abdominal cramping    possibly irritable bowel syndrome versus adhesions secondary to multiple surgeries and abdominal inflammation and 2009 secondary to diverticulitis and diverticular phlegmon  . Allergic rhinitis   . Arthritis   . Carotid artery disease ([place])   . Cataract   . Chest pain syndrome 2004   with adenosine Cardiolite that was normal  . Chronic hoarseness   . COPD (chronic obstructive pulmonary disease) ([place])   . GERD (gastroesophageal reflux disease)   . History of renal calculi   . Hypercholesterolemia   . Hypertension   . Incontinence    Cough incontinence  . Mild depression ([place])   . Osteopenia   . Peripheral vascular disease ([place])    with femoral and carotid bruits  . Tobacco dependence   . Vitamin D deficiency     There are no problems to display for this patient.   Past Surgical History:  Procedure Laterality Date  . BREAST BIOPSY    . COSMETIC SURGERY     on neck and eyes  .  DILATION AND CURETTAGE OF UTERUS    . FEMUR SURGERY  12/2007   left femur surgery--for femoral neck fracture status post fall   . LIPOSUCTION    . OTHER SURGICAL HISTORY     sigmoid colectomy  . SHOULDER SURGERY     arthroscopic shoulder surgery   . TUBAL LIGATION       OB History   No obstetric history on file.     Family History  Problem Relation Age of Onset  . Heart disease Mother   . Stroke Mother     Social History   Tobacco Use  . Smoking status: Current Every Day Smoker    Packs/day: 0.50    Types: Cigarettes  . Smokeless tobacco: Never Used  Substance Use Topics  . Alcohol use: Yes    Alcohol/week: 0.0 standard drinks    Comment: Occ glass of wine  . Drug use: No    Home Medications Prior to Admission medications   Medication Sig Start Date End Date Taking? Authorizing Provider  albuterol (PROVENTIL HFA;VENTOLIN HFA) 108 (90 Base) MCG/ACT inhaler 1-2 puffs every 6 hours as needed for wheezing and coughing 07/28/16  Yes Posey Boyer, MD  amLODipine (NORVASC) 5 MG tablet Take 5 mg by mouth daily. 02/14/20  Yes [provider]  aspirin 81 MG tablet Take 81 mg by mouth every evening.    Yes [provider]  Cholecalciferol (VITAMIN D-3 PO) Take 2,000 Units by mouth daily.    Yes [provider]  cloNIDine (CATAPRES) 0.1 MG tablet Take 0.1 mg by mouth at bedtime.    Yes [provider]  co-enzyme Q-10 30 MG capsule Take 30 mg by mouth daily.   Yes [provider]  famotidine (PEPCID) 20 MG tablet Take 20 mg by mouth every other day.    Yes [provider]  gabapentin (NEURONTIN) 100 MG capsule Take 2 capsules (200 mg total) by mouth at bedtime. PATIENT NEEDS OFFICE VISIT FOR ADDITIONAL REFILLS 07/30/15  Yes [name], MD  hydrALAZINE (APRESOLINE) 25 MG tablet Take 25 mg by mouth 2 (two) times daily. 02/21/20  Yes [provider]  ibuprofen (ADVIL,MOTRIN) 200 MG tablet Take 400 mg by mouth every 6  (six) hours as needed for moderate pain.    Yes [provider]  INCRUSE ELLIPTA 62.5 MCG/INH AEPB Inhale 1 puff into the lungs daily. 03/19/20  Yes [provider]  irbesartan-hydrochlorothiazide (AVALIDE) 150-12.5 MG per tablet Take 1 tablet by mouth daily.   Yes [provider]  meloxicam (MOBIC) 15 MG tablet Take 15 mg by mouth every evening.  04/09/20  Yes [provider]  metoprolol succinate (TOPROL-XL) 25 MG 24 hr tablet Take 25 mg by mouth every evening.    Yes [provider]  Multiple Vitamin (MULTIVITAMIN) tablet Take 1 tablet by mouth daily.   Yes [provider]  simvastatin (ZOCOR) 40 MG tablet Take 40 mg by mouth every evening.    Yes [provider]  vitamin B-12 (CYANOCOBALAMIN) 100 MCG tablet Take 100 mcg by mouth daily.   Yes [provider]  vitamin E (VITAMIN E) 180 MG (400 UNITS) capsule Take 400 Units by mouth daily.   Yes [provider]  amoxicillin-clavulanate (AUGMENTIN) 875-125 MG tablet Take 1 tablet by mouth 2 (two) times daily. Patient not taking: Reported on 04/11/2020 05/07/19   Deno Etienne, DO  fluticasone (FLONASE) 50 MCG/ACT nasal spray PLACE 2 SPRAYS INTO BOTH NOSTRILS DAILY. Patient not taking: Reported on 04/11/2020 09/26/16   Tereasa Coop, PA-C  HYDROcodone-acetaminophen [facility]) 5-325 MG per tablet Take 1 tablet by mouth every 6 (six) hours as needed. Patient not taking: Reported on 07/28/2016 12/24/14   [name], MD  nystatin (MYCOSTATIN) 100000 UNIT/ML suspension Take 5 mLs (500,000 Units total) by mouth 4 (four) times daily. Patient not taking: Reported on 04/11/2020 05/07/19   Deno Etienne, DO  omeprazole (PRILOSEC) 20 MG capsule Take 1 capsule (20 mg total) by mouth daily. 04/11/20 05/11/20  Anurag Scarfo, DO  predniSONE (DELTASONE) 20 MG tablet Take 2 daily for 3 days, then 1 daily for 3 days Patient not taking: Reported on 04/11/2020 07/28/16   Posey Boyer, MD  sucralfate  (CARAFATE) 1 g tablet Take 1 tablet (1 g total) by mouth 4 (four) times daily -  with meals and at bedtime for 14 days. 04/11/20 04/25/20  Lamond Glantz, DO    Allergies    Lisinopril, Other, Pollen extract, Tegretol [carbamazepine], and Tetracyclines & related  Review of Systems   Review of Systems  Constitutional: Negative for chills, fatigue and fever.  HENT: Negative for ear pain and sore throat.   Eyes: Negative for pain and visual disturbance.  Respiratory: Negative for cough and shortness of breath.   Cardiovascular: Negative for chest pain and palpitations.  Gastrointestinal: Positive for abdominal pain and nausea. Negative for abdominal  distention, anal bleeding, anorexia, blood in stool, constipation, diarrhea, melena, rectal pain and vomiting.  Genitourinary: Negative for dysuria, hematuria, vaginal bleeding and vaginal discharge.  Musculoskeletal: Negative for arthralgias and back pain.  Skin: Negative for color change and rash.  Neurological: Negative for seizures and syncope.  All other systems reviewed and are negative.   Physical Exam Updated Vital Signs  ED Triage Vitals  Enc Vitals Group     BP 04/11/20 1016 (!) 145/52     Pulse Rate 04/11/20 1016 96     Resp 04/11/20 1016 15     Temp 04/11/20 1016 97.8 F (36.6 C)     Temp Source 04/11/20 1016 Oral     SpO2 04/11/20 1016 100 %     Weight 04/11/20 1016 104 lb (47.2 kg)     Height 04/11/20 1016 5\' 1"  (1.549 m)     Head Circumference --      Peak Flow --      Pain Score 04/11/20 1048 7     Pain Loc --      Pain Edu? --      Excl. in [place]? --     Physical Exam Vitals and nursing note reviewed.  Constitutional:      General: She is not in acute distress.    Appearance: She is well-developed. She is not ill-appearing.  HENT:     Head: Normocephalic and atraumatic.     Mouth/Throat:     Mouth: Mucous membranes are moist.  Eyes:     Extraocular Movements: Extraocular movements intact.      Conjunctiva/sclera: Conjunctivae normal.  Cardiovascular:     Rate and Rhythm: Normal rate and regular rhythm.     Heart sounds: Normal heart sounds. No murmur.  Pulmonary:     Effort: Pulmonary effort is normal. No respiratory distress.     Breath sounds: Normal breath sounds.  Abdominal:     General: Abdomen is flat.     Palpations: Abdomen is soft.     Tenderness: There is generalized abdominal tenderness and tenderness in the left lower quadrant. There is guarding. There is no right CVA tenderness, left CVA tenderness or rebound. Negative signs include Murphy's sign, Rovsing's sign and McBurney's sign.  Musculoskeletal:     Cervical back: Neck supple.  Skin:    General: Skin is warm and dry.  Neurological:     Mental Status: She is alert.     ED Results / Procedures / Treatments   Labs (all labs ordered are listed, but only abnormal results are displayed) Labs Reviewed  COMPREHENSIVE METABOLIC PANEL - Abnormal; Notable for the following components:      Result Value   Potassium 3.3 (*)    Chloride 96 (*)    Glucose, Bld 144 (*)    Anion gap 16 (*)    All other components within normal limits  URINALYSIS, ROUTINE W REFLEX MICROSCOPIC - Abnormal; Notable for the following components:   Color, Urine AMBER (*)    APPearance CLOUDY (*)    Protein, ur 30 (*)    Bacteria, UA RARE (*)    All other components within normal limits  URINE CULTURE  LIPASE, BLOOD  CBC    EKG None  Radiology CT ABDOMEN PELVIS W CONTRAST  Result Date: 04/11/2020 CLINICAL DATA:  Abdominal distension, diffuse abdominal pain. History of diverticulitis with bowel resection. EXAM: CT ABDOMEN AND PELVIS WITH CONTRAST TECHNIQUE: Multidetector CT imaging of the abdomen and pelvis was performed using the standard  protocol following bolus administration of intravenous contrast. CONTRAST:  37mL OMNIPAQUE IOHEXOL 300 MG/ML  SOLN COMPARISON:  None. FINDINGS: Lower chest: Lung bases are clear. Hepatobiliary: No  focal hepatic lesion. Mild periportal edema. Gallbladder appears normal. There is mild intrahepatic biliary duct dilatation. The common hepatic duct and common bile duct are upper limits of normal caliber. There is some fluid in the porta hepatis. Pancreas: Pancreas is normal. No ductal dilatation. No pancreatic inflammation. Spleen: Normal spleen Adrenals/urinary tract: Adrenal glands and kidneys are normal. The ureters and bladder normal. Stomach/Bowel: Submucosal edema within the gastric antrum extending into the first portion the duodenum as seen on coronal image 59/series 6 and axial image 25/series 3. There is no evidence frank perforation. No intraperitoneal free air. The more proximal gastric body and gastric cardia are normal. The duodenum is mildly dilated through the second and third portion. The distal third and fourth portion duodenum are normal. Small bowel is normal. Scattered diverticula of the colon without inflammation. Rectum normal. Vascular/Lymphatic: Abdominal aorta is normal caliber with atherosclerotic calcification. There is no retroperitoneal or periportal lymphadenopathy. No pelvic lymphadenopathy. Reproductive: Uterus and adnexa unremarkable. Other: No free fluid. Musculoskeletal: No aggressive osseous lesion. IMPRESSION: 1. Prominent submucosal edema within the gastric antrum and first portion the duodenum consistent with gastritis/duodenitis. Potential peptic ulcer disease. No perforation evident. 2. Mild intrahepatic biliary duct dilatation. Small amount of fluid the porta hepatis is favored inflammation secondary to the duodenitis scribe above. Common bile duct normal caliber. 3. Gallbladder appears normal. 4. No evidence of pancreatitis. Electronically Signed   By: Suzy Bouchard M.D.   On: 04/11/2020 16:11    Procedures Procedures (including critical care time)  Medications Ordered in ED Medications  sodium chloride flush (NS) 0.9 % injection 3 mL (3 mLs Intravenous Not  Given 04/11/20 1609)  fentaNYL (SUBLIMAZE) injection 50 mcg (50 mcg Intravenous Given 04/11/20 1210)  ondansetron (ZOFRAN) injection 4 mg (4 mg Intravenous Given 04/11/20 1210)  sodium chloride 0.9 % bolus 1,000 mL (0 mLs Intravenous Stopped 04/11/20 1533)  iohexol (OMNIPAQUE) 300 MG/ML solution 75 mL (75 mLs Intravenous Contrast Given 04/11/20 1542)    ED Course  I have reviewed the triage vital signs and the nursing notes.  Pertinent labs & imaging results that were available during my care of the patient were reviewed by me and considered in my medical decision making (see chart for details).    MDM Rules/Calculators/A&P                      GENEVIENE TESCH is a 79 year old female history of high cholesterol, COPD, CAD who presents to the ED with abdominal pain. Patient with normal vitals. No fever. Pain for the last several days. Started about 10 days ago but progressively getting worse. Has a history of diverticulitis. Patient mostly tender in the suprapubic area/left lower quadrant. Has had some nausea but no vomiting. Having normal bowel movements. Overall diffuse tenderness per patient but appears to be more lower on examination. Concern for diverticulitis versus bowel obstruction versus urinary tract infection. Will get lab work including CT scan abdomen pelvis to further evaluate. Will give IV fluids, IV Zofran, IV fentanyl.  Lab work overall unremarkable.  No significant anemia, electrolyte abnormality, kidney injury.  Urinalysis negative for infection.  CT scan shows likely gastritis/duodenitis.  No perforation.  No diverticulitis.  No evidence of pancreatitis.  Patient is on Pepcid.  Will start omeprazole and Carafate.  Educated about dietary changes that  are needed.  Given information to follow-up with GI.  Told to return to the ED if she noticed any blood in her stool or black stool.  Also told to return if symptoms worsen.  Discharged in good condition.  Understands return precautions.  This  chart was dictated using voice recognition software.  Despite best efforts to proofread,  errors can occur which can change the documentation meaning.    Final Clinical Impression(s) / ED Diagnoses Final diagnoses:  Acute gastritis without hemorrhage, unspecified gastritis type    Rx / DC Orders ED Discharge Orders         Ordered    omeprazole (PRILOSEC) 20 MG capsule  Daily     04/11/20 1654    sucralfate (CARAFATE) 1 g tablet  3 times daily with meals & bedtime     04/11/20 1654           Loyd Salvador, DO 04/11/20 1657

## 2020-04-11 NOTE — Discharge Instructions (Addendum)
Please return to the ED if symptoms worsen.  Please return to the ED if you have any blood in your stool including black stool.

## 2020-04-12 LAB — URINE CULTURE: Culture: NO GROWTH

## 2020-04-18 DIAGNOSIS — I10 Essential (primary) hypertension: Secondary | ICD-10-CM | POA: Diagnosis not present

## 2020-04-18 DIAGNOSIS — R109 Unspecified abdominal pain: Secondary | ICD-10-CM | POA: Diagnosis not present

## 2020-04-18 DIAGNOSIS — M5431 Sciatica, right side: Secondary | ICD-10-CM | POA: Diagnosis not present

## 2020-05-29 DIAGNOSIS — M5136 Other intervertebral disc degeneration, lumbar region: Secondary | ICD-10-CM | POA: Diagnosis not present

## 2020-05-29 DIAGNOSIS — M5416 Radiculopathy, lumbar region: Secondary | ICD-10-CM | POA: Diagnosis not present

## 2020-05-29 DIAGNOSIS — M545 Low back pain: Secondary | ICD-10-CM | POA: Diagnosis not present

## 2020-06-12 DIAGNOSIS — M5416 Radiculopathy, lumbar region: Secondary | ICD-10-CM | POA: Diagnosis not present

## 2020-06-12 DIAGNOSIS — M545 Low back pain: Secondary | ICD-10-CM | POA: Diagnosis not present

## 2020-06-18 DIAGNOSIS — M5416 Radiculopathy, lumbar region: Secondary | ICD-10-CM | POA: Diagnosis not present

## 2020-06-20 ENCOUNTER — Encounter (INDEPENDENT_AMBULATORY_CARE_PROVIDER_SITE_OTHER): Payer: Medicare Other | Admitting: Ophthalmology

## 2020-06-28 ENCOUNTER — Encounter (INDEPENDENT_AMBULATORY_CARE_PROVIDER_SITE_OTHER): Payer: Medicare Other | Admitting: Ophthalmology

## 2020-06-28 ENCOUNTER — Other Ambulatory Visit: Payer: Self-pay

## 2020-06-28 DIAGNOSIS — Z1231 Encounter for screening mammogram for malignant neoplasm of breast: Secondary | ICD-10-CM | POA: Diagnosis not present

## 2020-06-28 DIAGNOSIS — D3132 Benign neoplasm of left choroid: Secondary | ICD-10-CM

## 2020-06-28 DIAGNOSIS — H35033 Hypertensive retinopathy, bilateral: Secondary | ICD-10-CM | POA: Diagnosis not present

## 2020-06-28 DIAGNOSIS — I10 Essential (primary) hypertension: Secondary | ICD-10-CM | POA: Diagnosis not present

## 2020-06-28 DIAGNOSIS — H43813 Vitreous degeneration, bilateral: Secondary | ICD-10-CM

## 2020-07-05 DIAGNOSIS — M5136 Other intervertebral disc degeneration, lumbar region: Secondary | ICD-10-CM | POA: Diagnosis not present

## 2020-07-31 DIAGNOSIS — M5136 Other intervertebral disc degeneration, lumbar region: Secondary | ICD-10-CM | POA: Diagnosis not present

## 2020-07-31 DIAGNOSIS — M48062 Spinal stenosis, lumbar region with neurogenic claudication: Secondary | ICD-10-CM | POA: Diagnosis not present

## 2020-08-13 DIAGNOSIS — Z23 Encounter for immunization: Secondary | ICD-10-CM | POA: Diagnosis not present

## 2020-08-29 DIAGNOSIS — M48061 Spinal stenosis, lumbar region without neurogenic claudication: Secondary | ICD-10-CM | POA: Diagnosis not present

## 2020-08-29 DIAGNOSIS — R413 Other amnesia: Secondary | ICD-10-CM | POA: Diagnosis not present

## 2020-08-29 DIAGNOSIS — Z0001 Encounter for general adult medical examination with abnormal findings: Secondary | ICD-10-CM | POA: Diagnosis not present

## 2020-08-29 DIAGNOSIS — F17210 Nicotine dependence, cigarettes, uncomplicated: Secondary | ICD-10-CM | POA: Diagnosis not present

## 2020-08-29 DIAGNOSIS — M810 Age-related osteoporosis without current pathological fracture: Secondary | ICD-10-CM | POA: Diagnosis not present

## 2020-08-29 DIAGNOSIS — R011 Cardiac murmur, unspecified: Secondary | ICD-10-CM | POA: Diagnosis not present

## 2020-08-29 DIAGNOSIS — M545 Low back pain, unspecified: Secondary | ICD-10-CM | POA: Diagnosis not present

## 2020-08-29 DIAGNOSIS — I10 Essential (primary) hypertension: Secondary | ICD-10-CM | POA: Diagnosis not present

## 2020-08-29 DIAGNOSIS — J449 Chronic obstructive pulmonary disease, unspecified: Secondary | ICD-10-CM | POA: Diagnosis not present

## 2020-08-29 DIAGNOSIS — E782 Mixed hyperlipidemia: Secondary | ICD-10-CM | POA: Diagnosis not present

## 2020-08-29 DIAGNOSIS — Z79899 Other long term (current) drug therapy: Secondary | ICD-10-CM | POA: Diagnosis not present

## 2020-08-29 DIAGNOSIS — E559 Vitamin D deficiency, unspecified: Secondary | ICD-10-CM | POA: Diagnosis not present

## 2020-09-04 DIAGNOSIS — M48062 Spinal stenosis, lumbar region with neurogenic claudication: Secondary | ICD-10-CM | POA: Diagnosis not present

## 2020-09-04 DIAGNOSIS — M5136 Other intervertebral disc degeneration, lumbar region: Secondary | ICD-10-CM | POA: Diagnosis not present

## 2020-09-14 DIAGNOSIS — M48062 Spinal stenosis, lumbar region with neurogenic claudication: Secondary | ICD-10-CM | POA: Diagnosis not present

## 2020-10-01 DIAGNOSIS — M48061 Spinal stenosis, lumbar region without neurogenic claudication: Secondary | ICD-10-CM | POA: Diagnosis not present

## 2020-10-01 DIAGNOSIS — R011 Cardiac murmur, unspecified: Secondary | ICD-10-CM | POA: Diagnosis not present

## 2020-10-01 DIAGNOSIS — M545 Low back pain, unspecified: Secondary | ICD-10-CM | POA: Diagnosis not present

## 2020-10-01 DIAGNOSIS — Z79899 Other long term (current) drug therapy: Secondary | ICD-10-CM | POA: Diagnosis not present

## 2020-10-01 DIAGNOSIS — E559 Vitamin D deficiency, unspecified: Secondary | ICD-10-CM | POA: Diagnosis not present

## 2020-10-01 DIAGNOSIS — F17210 Nicotine dependence, cigarettes, uncomplicated: Secondary | ICD-10-CM | POA: Diagnosis not present

## 2020-10-01 DIAGNOSIS — M810 Age-related osteoporosis without current pathological fracture: Secondary | ICD-10-CM | POA: Diagnosis not present

## 2020-10-01 DIAGNOSIS — I10 Essential (primary) hypertension: Secondary | ICD-10-CM | POA: Diagnosis not present

## 2020-10-01 DIAGNOSIS — R0989 Other specified symptoms and signs involving the circulatory and respiratory systems: Secondary | ICD-10-CM | POA: Diagnosis not present

## 2020-10-01 DIAGNOSIS — E782 Mixed hyperlipidemia: Secondary | ICD-10-CM | POA: Diagnosis not present

## 2020-10-01 DIAGNOSIS — J449 Chronic obstructive pulmonary disease, unspecified: Secondary | ICD-10-CM | POA: Diagnosis not present

## 2020-10-01 DIAGNOSIS — R413 Other amnesia: Secondary | ICD-10-CM | POA: Diagnosis not present

## 2020-10-02 ENCOUNTER — Other Ambulatory Visit: Payer: Self-pay | Admitting: Neurosurgery

## 2020-10-10 DIAGNOSIS — R011 Cardiac murmur, unspecified: Secondary | ICD-10-CM | POA: Diagnosis not present

## 2020-10-10 DIAGNOSIS — R0989 Other specified symptoms and signs involving the circulatory and respiratory systems: Secondary | ICD-10-CM | POA: Diagnosis not present

## 2020-10-10 DIAGNOSIS — I6523 Occlusion and stenosis of bilateral carotid arteries: Secondary | ICD-10-CM | POA: Diagnosis not present

## 2020-11-07 NOTE — Progress Notes (Signed)
[id number] [place], [place] - [address] [place] [address] Phone: [phone] Fax: [phone]      Your procedure is scheduled on January 10  Report to [facility] Main Entrance "A" at 0530 A.M., and check in at the Admitting office.  Call this number if you have problems the morning of surgery:  [phone]  Call [phone] if you have any questions prior to your surgery date Monday-Friday 8am-4pm    Remember:  Do not eat  Or drink after midnight the night before your surgery    Take these medicines the morning of surgery with A SIP OF WATER  amLODipine (NORVASC) famotidine (PEPCID)  INCRUSE ELLIPTA  If needed, Please bring all inhalers with you the day of surgery.  metoprolol succinate (TOPROL-XL) - make sure to take it the night before like you normally do omeprazole (PRILOSEC)  Follow your surgeon's instructions on when to stop Aspirin.  If no instructions were given by your surgeon then you will need to call the office to get those instructions.    As of today, STOP taking any Aleve, Naproxen, Ibuprofen, Motrin, Advil, Goody's, BC's, all herbal medications, fish oil, and all vitamins.                      Do not wear jewelry, make up, or nail polish            Do not wear lotions, powders, perfumes, or deodorant.            Do not shave 48 hours prior to surgery.             Do not bring valuables to the hospital.            [facility] is not responsible for any belongings or valuables.  Do NOT Smoke (Tobacco/Vaping) or drink Alcohol 24 hours prior to your procedure If you use a CPAP at night, you may bring all equipment for your overnight stay.   Contacts, glasses, dentures or bridgework may not be worn into surgery.      For patients admitted to the hospital, discharge time will be determined by your treatment team.   Patients discharged the day of surgery will not be allowed to drive home, and someone needs to  stay with them for 24 hours.    Special instructions:   [facility]- Preparing For Surgery  Before surgery, you can play an important role. Because skin is not sterile, your skin needs to be as free of germs as possible. You can reduce the number of germs on your skin by washing with CHG (chlorahexidine gluconate) Soap before surgery.  CHG is an antiseptic cleaner which kills germs and bonds with the skin to continue killing germs even after washing.    Oral Hygiene is also important to reduce your risk of infection.  Remember - BRUSH YOUR TEETH THE MORNING OF SURGERY WITH YOUR REGULAR TOOTHPASTE  Please do not use if you have an allergy to CHG or antibacterial soaps. If your skin becomes reddened/irritated stop using the CHG.  Do not shave (including legs and underarms) for at least 48 hours prior to first CHG shower. It is OK to shave your face.  Please follow these instructions carefully.   1. Shower the NIGHT BEFORE SURGERY and the MORNING OF SURGERY with CHG Soap.   2. If you chose to wash your hair, wash your hair first as usual with your normal  shampoo.  3. After you shampoo, rinse your hair and body thoroughly to remove the shampoo.  4. Use CHG as you would any other liquid soap. You can apply CHG directly to the skin and wash gently with a scrungie or a clean washcloth.   5. Apply the CHG Soap to your body ONLY FROM THE NECK DOWN.  Do not use on open wounds or open sores. Avoid contact with your eyes, ears, mouth and genitals (private parts). Wash Face and genitals (private parts)  with your normal soap.   6. Wash thoroughly, paying special attention to the area where your surgery will be performed.  7. Thoroughly rinse your body with warm water from the neck down.  8. DO NOT shower/wash with your normal soap after using and rinsing off the CHG Soap.  9. Pat yourself dry with a CLEAN TOWEL.  10. Wear CLEAN PAJAMAS to bed the night before surgery  11. Place CLEAN SHEETS on  your bed the night of your first shower and DO NOT SLEEP WITH PETS.   Day of Surgery: Wear Clean/Comfortable clothing the morning of surgery Do not apply any deodorants/lotions.   Remember to brush your teeth WITH YOUR REGULAR TOOTHPASTE.   Please read over the following fact sheets that you were given.

## 2020-11-08 ENCOUNTER — Other Ambulatory Visit: Payer: Self-pay

## 2020-11-08 ENCOUNTER — Encounter (HOSPITAL_COMMUNITY)
Admission: RE | Admit: 2020-11-08 | Discharge: 2020-11-08 | Disposition: A | Discharge status: Home / Self Care | Payer: Medicare Other | Source: Ambulatory Visit | Attending: Neurosurgery | Admitting: Neurosurgery

## 2020-11-08 ENCOUNTER — Encounter (HOSPITAL_COMMUNITY): Payer: Self-pay

## 2020-11-08 ENCOUNTER — Other Ambulatory Visit (HOSPITAL_COMMUNITY)
Admission: RE | Admit: 2020-11-08 | Discharge: 2020-11-08 | Disposition: A | Discharge status: Home / Self Care | Payer: Medicare Other | Source: Ambulatory Visit | Attending: Neurosurgery | Admitting: Neurosurgery

## 2020-11-08 DIAGNOSIS — Z01812 Encounter for preprocedural laboratory examination: Secondary | ICD-10-CM | POA: Insufficient documentation

## 2020-11-08 DIAGNOSIS — Z20822 Contact with and (suspected) exposure to covid-19: Secondary | ICD-10-CM | POA: Insufficient documentation

## 2020-11-08 DIAGNOSIS — Z01818 Encounter for other preprocedural examination: Secondary | ICD-10-CM | POA: Diagnosis not present

## 2020-11-08 HISTORY — DX: Nausea with vomiting, unspecified: R11.2

## 2020-11-08 HISTORY — DX: Myoneural disorder, unspecified: G70.9

## 2020-11-08 HISTORY — DX: Other specified postprocedural states: Z98.890

## 2020-11-08 HISTORY — DX: Atherosclerotic heart disease of native coronary artery without angina pectoris: I25.10

## 2020-11-08 HISTORY — DX: Cardiac murmur, unspecified: R01.1

## 2020-11-08 HISTORY — DX: Personal history of urinary calculi: Z87.442

## 2020-11-08 LAB — CBC
HCT: 40.4 % (ref 36.0–46.0)
Hemoglobin: 13 g/dL (ref 12.0–15.0)
MCH: 30.8 pg (ref 26.0–34.0)
MCHC: 32.2 g/dL (ref 30.0–36.0)
MCV: 95.7 fL (ref 80.0–100.0)
Platelets: 222 10*3/uL (ref 150–400)
RBC: 4.22 MIL/uL (ref 3.87–5.11)
RDW: 12.6 % (ref 11.5–15.5)
WBC: 9.1 10*3/uL (ref 4.0–10.5)
nRBC: 0 % (ref 0.0–0.2)

## 2020-11-08 LAB — BASIC METABOLIC PANEL
Anion gap: 10 (ref 5–15)
BUN: 9 mg/dL (ref 8–23)
CO2: 28 mmol/L (ref 22–32)
Calcium: 9.6 mg/dL (ref 8.9–10.3)
Chloride: 100 mmol/L (ref 98–111)
Creatinine, Ser: 0.84 mg/dL (ref 0.44–1.00)
GFR, Estimated: >60 mL/min (ref >60)
Glucose, Bld: 106 mg/dL — ABNORMAL HIGH (ref 70–99)
Potassium: 3.6 mmol/L (ref 3.5–5.1)
Sodium: 138 mmol/L (ref 135–145)

## 2020-11-08 LAB — SURGICAL PCR SCREEN
MRSA, PCR: NEGATIVE
Staphylococcus aureus: POSITIVE — AB

## 2020-11-08 LAB — TYPE AND SCREEN
ABO/RH(D): A POS
Antibody Screen: NEGATIVE

## 2020-11-08 NOTE — Progress Notes (Signed)
MSSA+ on surgical PCR. Will need Profend on DOS per protocol.

## 2020-11-08 NOTE — Progress Notes (Signed)
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 Ricki Miller note in 11/2013) EF 70% Cardiac Cath - denies  Sleep Study - denies   Patient instructed to hold all Aspirin, NSAID's, herbal medications, fish oil and vitamins 7 days prior to surgery.   ERAS Protcol -no    COVID TEST- 11/08/2020   Anesthesia review: yes, cardiac history but is no longer followed by cardiology. Pt states she hasn't followed up with a cardiologist since 2015. No ongoing issues per patient.   Patient denies shortness of breath, fever, cough and chest pain at PAT appointment   All instructions explained to the patient, with a verbal understanding of the material. Patient agrees to go over the instructions while at home for a better understanding. Patient also instructed to self quarantine after being tested for COVID-19. The opportunity to ask questions was provided.

## 2020-11-09 ENCOUNTER — Encounter (HOSPITAL_COMMUNITY): Payer: Self-pay

## 2020-11-09 LAB — SARS CORONAVIRUS 2 (TAT 6-24 HRS): SARS Coronavirus 2: NEGATIVE

## 2020-11-09 NOTE — Anesthesia Preprocedure Evaluation (Addendum)
Anesthesia Evaluation  Patient identified by MRN, date of birth, ID band Patient awake    Reviewed: Allergy & Precautions, NPO status , Patient's Chart, lab work & pertinent test results, reviewed documented beta blocker date and time   History of Anesthesia Complications (+) PONV and history of anesthetic complications  Airway Mallampati: II  TM Distance: >3 FB Neck ROM: Full    Dental  (+) Dental Advisory Given, Upper Dentures   Pulmonary COPD, Current Smoker and Patient abstained from smoking.,    Pulmonary exam normal        Cardiovascular hypertension, Pt. on medications and Pt. on home beta blockers Normal cardiovascular exam+ Valvular Problems/Murmurs AS and AI   10/10/20 echo report received from [facility] and showed normal LVF, mild AS, mild AI with 3 year follow-up recommended by interpreting physician Deniece Ree, [name], MD).    Neuro/Psych PSYCHIATRIC DISORDERS Depression negative neurological ROS     GI/Hepatic Neg liver ROS, GERD  ,  Endo/Other  negative endocrine ROS  Renal/GU negative Renal ROS     Musculoskeletal negative musculoskeletal ROS (+)   Abdominal   Peds  Hematology negative hematology ROS (+)   Anesthesia Other Findings   Reproductive/Obstetrics                           Anesthesia Physical Anesthesia Plan  ASA: III  Anesthesia Plan: General   Post-op Pain Management:    Induction: Intravenous  PONV Risk Score and Plan: 4 or greater and Ondansetron, Dexamethasone and Treatment may vary due to age or medical condition  Airway Management Planned: Oral ETT  Additional Equipment:   Intra-op Plan:   Post-operative Plan: Extubation in OR  Informed Consent: I have reviewed the patients History and Physical, chart, labs and discussed the procedure including the risks, benefits and alternatives for the proposed anesthesia with the patient or authorized  representative who has indicated his/her understanding and acceptance.     Dental advisory given  Plan Discussed with: Anesthesiologist  Anesthesia Plan Comments: (PAT note written 11/09/2020 by Myra Gianotti, PA-C. )      Anesthesia Quick Evaluation

## 2020-11-09 NOTE — Progress Notes (Signed)
Anesthesia Chart Review:  Case: [id number] Date/Time: 11/12/20 0715   Procedure: LAMINECTOMY AND FORAMINOTOMY L34, L45, L5S1 (N/A ) - 3C   Anesthesia type: General   Pre-op diagnosis: SPINAL STENOSIS, LUMBAR REGION WITH NEUROGENIC CLAUDICATION   Location: [facility] ROOM 18 / [facility]   Surgeons: Tressie Stalker, MD      DISCUSSION: Patient is an 80 year old female scheduled for the above procedure.  History includes smoking (< 1 PPD), post-operative N/V, murmur, HTN, hypercholesterolemia, COPD, GERD, PAD, carotid occlusive disease, chronic hoarseness, neuropathy (BLE), diverticulitis (s/p sigmoid colectomy 2009). She denied known CAD, although did have coronary calcification on 2017 chest CT for lung cancer screening.   I called and spoke with patient. She reported that she feels well other that her back issues that have been causing some pain but more predominantly LE numbness. 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 Currently she can not walk long distances due to her legs, but previously was working out three times a week (last ~ 2 years ago). She denied chest pain, SOB, or DOE with usual activities. Denied orthopnea, syncope, edema. Says that Dr. Kevan Ny is monitoring her carotid disease and murmur with recent testing last month (records requested). She is not aware of a specific valvular issue that is contributing to her murmur, and so far has not required frequent echocardiograms (previous one ~ 2013). She usually lives alone, but currently granddaughter and great grandchildren are living with her while their home is being built. She continues to smoke < 1 PPD and does not plan to quit at this point. She uses Incruse sometimes, but denied any recent or frequent COPD exacerbations. She does not require supplemental O2. Her post-operative N/V has been controlled previously with pre-medications.   UPDATE: 10/10/20 echo report received from [facility] and showed  normal LVF, mild AS, mild AI with 3 year follow-up recommended by interpreting physician Myriam Jacobson, Jomarie Longs, MD).   Preoperative COVID-19 test negative on 11/08/20. Anesthesia team to evaluate on the day of surgery.    VS: BP (!) 162/63   Pulse 87   Temp 36.6 C (Oral)   Resp 18   Ht 5\' 1"  (1.549 m)   Wt 50 kg   SpO2 100%   BMI 20.84 kg/m    PROVIDERS: , MD is PCP    LABS: Labs reviewed: Acceptable for surgery. (all labs ordered are listed, but only abnormal results are displayed)  Labs Reviewed  SURGICAL PCR SCREEN - Abnormal; Notable for the following components:      Result Value   Staphylococcus aureus POSITIVE (*)    All other components within normal limits  BASIC METABOLIC PANEL - Abnormal; Notable for the following components:   Glucose, Bld 106 (*)    All other components within normal limits  CBC  TYPE AND SCREEN     IMAGES: 06/12/20 MRI L-spine images can be viewed in [facility]/PACS.   EKG: 11/08/20: Normal sinus rhythm Biatrial enlargement Abnormal ECG Confirmed by 01/06/21 Lennie Odor) on 11/08/2020 7:08:34 PM   CV: Echo 10/10/20 ([facility] ): Findings: - Left ventricle: There is normal LV systolic function.  There are no wall motion abnormalities.  LV wall thickness is mildly increased.  There is no LV hypertrophy.  There is Doppler evidence of left ventricular diastolic dysfunction. - Right ventricle: RV size is normal. - Left atrium: The LA volume index is 19.86 mL/m.  The LA volume index is within normal limits. -  Right atrium: RA chamber size is normal. - Aortic valve: There is a trileaflet aortic valve.  There is mild aortic regurgitation.  There is mild aortic valve stenosis. AVvel 2.17 m/s. LVOTvel 1.08 m/s. Pk Grad 18.79 mmHg. Mn Grad 11.20 mmHg. AVA (pk) 0.86 cm2. AVA (VTI) 1.05 cm2. - Mitral valve: There is moderate calcification of the mitral valve anterior and posterior leaflet.  There is moderate mitral annular calcification.   There is minimal mitral regurgitation. - Tricuspid valve: Tricuspid valve is structurally normal.  There is no tricuspid valve stenosis.  There is minimal tricuspid regurgitation.  The estimated RVSP is normal.  The estimated RVSP is 11.56 mmHg.  There was a poor quality tricuspid regurgitation envelope. - Pulmonary valve:  There is normal pulmonic valve structure and function.  There is no pulmonic valve stenosis or regurgitation. - Pericardium: There is no pericardial effusion present. - Aortic: The size of the visualized portion of the aortic root is within normal limits.  The ascending aorta is not well visualized. - Venous: The IVC is not dilated and collapses > 50% with inspiration. IMPRESSIONS:  1.  Mild LVH and possible diastolic dysfunction. 2. Mild aortic stenosis and aortic insufficiency. - Recommendations: LVH risk factor modification, consider diastolic dysfunction.  Repeat echo in 3 years for AS and AI.   Carotid US 10/10/20 [facility], Report in [facility]/PACS): IMPRESSION: 1. Right carotid artery system: 50-69% stenosis secondary to moderate focal atherosclerotic plaque formation in the carotid bulb. 2. Left carotid artery system: Less than 50% stenosis secondary to mild atherosclerotic plaque formation. 3. Vertebral artery system: Patent with antegrade flow bilaterally.   BLE Arterial US 02/28/16: IMPRESSION: - Resting noninvasive arterial exam demonstrates no significant arterial occlusive disease of the lower extremities. - Decreased waveform at the left and right great toe may indicate developing small vessel disease given the history of diabetes.   Nuclear stress test 11/21/13: Overall Impression:  Normal stress nuclear study. LV Ejection Fraction: 72%.  LV Wall Motion:  NL LV Function; NL Wall Motion   Past Medical History:  Diagnosis Date  . Abdominal cramping    possibly irritable bowel syndrome versus adhesions secondary to multiple surgeries and  abdominal inflammation and 2009 secondary to diverticulitis and diverticular phlegmon  . Allergic rhinitis   . Arthritis   . Carotid artery disease (HCC)    62-83% RICA, < 15% LICA 17/6/16 [name] ([facility])  . Cataract   . Chest pain syndrome 2004   with adenosine Cardiolite that was normal  . Chronic hoarseness   . COPD (chronic obstructive pulmonary disease) ([place])   . GERD (gastroesophageal reflux disease)   . Heart murmur    Mild AS, AI. AV pk grad 18.79 mmHg, mn grad 11.20 mmHg, AVA (VTI) 1.05 cm2 10/10/20, 3 year f/u rec ([facility])  . History of kidney stones   . History of renal calculi   . Hypercholesterolemia   . Hypertension   . Incontinence    Cough incontinence  . Mild depression ([place])   . Neuromuscular disorder (HCC)    neuropathy bilateral lower extremities  . Osteopenia   . Peripheral vascular disease ([place])    with femoral and carotid bruits  . PONV (postoperative nausea and vomiting)   . Tobacco dependence   . Vitamin D deficiency     Past Surgical History:  Procedure Laterality Date  . APPENDECTOMY    . BREAST BIOPSY    . CATARACT EXTRACTION, BILATERAL    . COLON SURGERY  2009  . COSMETIC SURGERY     on neck and eyes  . DILATION AND CURETTAGE OF UTERUS    . EYE SURGERY     2014  . FEMUR SURGERY  12/2007   left femur surgery--for femoral neck fracture status post fall   . FRACTURE SURGERY     broke left leg and left arm- 2012  . LIPOSUCTION    . OTHER SURGICAL HISTORY     sigmoid colectomy  . SHOULDER SURGERY     arthroscopic shoulder surgery   . TONSILLECTOMY    . TUBAL LIGATION      MEDICATIONS: . amLODipine (NORVASC) 5 MG tablet  . aspirin 81 MG tablet  . cloNIDine (CATAPRES) 0.1 MG tablet  . Coenzyme Q10 100 MG capsule  . famotidine (PEPCID) 20 MG tablet  . gabapentin (NEURONTIN) 100 MG capsule  . ibuprofen (ADVIL,MOTRIN) 200 MG tablet  . INCRUSE ELLIPTA 62.5 MCG/INH AEPB  . irbesartan-hydrochlorothiazide (AVALIDE) 150-12.5 MG per tablet   . metoprolol succinate (TOPROL-XL) 25 MG 24 hr tablet  . Multiple Vitamin (MULTIVITAMIN) tablet  . omeprazole (PRILOSEC) 20 MG capsule  . simvastatin (ZOCOR) 40 MG tablet  . sucralfate (CARAFATE) 1 g tablet  . Turmeric 500 MG CAPS  . vitamin B-12 (CYANOCOBALAMIN) 100 MCG tablet  . vitamin E 180 MG (400 UNITS) capsule   No current facility-administered medications for this encounter.    Myra Gianotti, PA-C Surgical Short Stay/Anesthesiology [facility] Phone [phone] [facility] Phone [phone] 11/09/2020 4:14 PM

## 2020-11-12 ENCOUNTER — Other Ambulatory Visit: Payer: Self-pay

## 2020-11-12 ENCOUNTER — Encounter (HOSPITAL_COMMUNITY): Payer: Self-pay | Admitting: Neurosurgery

## 2020-11-12 ENCOUNTER — Ambulatory Visit (HOSPITAL_COMMUNITY): Payer: Medicare Other | Admitting: Certified Registered"

## 2020-11-12 ENCOUNTER — Ambulatory Visit (HOSPITAL_COMMUNITY)
Admission: RE | Admit: 2020-11-12 | Discharge: 2020-11-12 | Disposition: A | Discharge status: Home / Self Care | Payer: Medicare Other | Attending: Neurosurgery | Admitting: Neurosurgery

## 2020-11-12 ENCOUNTER — Ambulatory Visit (HOSPITAL_COMMUNITY): Payer: Medicare Other

## 2020-11-12 ENCOUNTER — Encounter (HOSPITAL_COMMUNITY)
Admission: RE | Disposition: A | Discharge status: Home / Self Care | Payer: Self-pay | Source: Home / Self Care | Attending: Neurosurgery

## 2020-11-12 ENCOUNTER — Ambulatory Visit (HOSPITAL_COMMUNITY): Payer: Medicare Other | Admitting: Physician Assistant

## 2020-11-12 DIAGNOSIS — Z79899 Other long term (current) drug therapy: Secondary | ICD-10-CM | POA: Insufficient documentation

## 2020-11-12 DIAGNOSIS — Z888 Allergy status to other drugs, medicaments and biological substances status: Secondary | ICD-10-CM | POA: Diagnosis not present

## 2020-11-12 DIAGNOSIS — Z8249 Family history of ischemic heart disease and other diseases of the circulatory system: Secondary | ICD-10-CM | POA: Diagnosis not present

## 2020-11-12 DIAGNOSIS — Z881 Allergy status to other antibiotic agents status: Secondary | ICD-10-CM | POA: Diagnosis not present

## 2020-11-12 DIAGNOSIS — M4807 Spinal stenosis, lumbosacral region: Secondary | ICD-10-CM | POA: Diagnosis not present

## 2020-11-12 DIAGNOSIS — M5416 Radiculopathy, lumbar region: Secondary | ICD-10-CM | POA: Diagnosis not present

## 2020-11-12 DIAGNOSIS — Z9889 Other specified postprocedural states: Secondary | ICD-10-CM | POA: Diagnosis not present

## 2020-11-12 DIAGNOSIS — M4316 Spondylolisthesis, lumbar region: Secondary | ICD-10-CM | POA: Insufficient documentation

## 2020-11-12 DIAGNOSIS — Z7982 Long term (current) use of aspirin: Secondary | ICD-10-CM | POA: Diagnosis not present

## 2020-11-12 DIAGNOSIS — Z823 Family history of stroke: Secondary | ICD-10-CM | POA: Diagnosis not present

## 2020-11-12 DIAGNOSIS — Z419 Encounter for procedure for purposes other than remedying health state, unspecified: Secondary | ICD-10-CM

## 2020-11-12 DIAGNOSIS — F17210 Nicotine dependence, cigarettes, uncomplicated: Secondary | ICD-10-CM | POA: Insufficient documentation

## 2020-11-12 DIAGNOSIS — M48062 Spinal stenosis, lumbar region with neurogenic claudication: Secondary | ICD-10-CM | POA: Insufficient documentation

## 2020-11-12 DIAGNOSIS — I10 Essential (primary) hypertension: Secondary | ICD-10-CM | POA: Diagnosis not present

## 2020-11-12 HISTORY — PX: LUMBAR LAMINECTOMY/DECOMPRESSION MICRODISCECTOMY: SHX5026

## 2020-11-12 LAB — ABO/RH: ABO/RH(D): A POS

## 2020-11-12 SURGERY — LUMBAR LAMINECTOMY/DECOMPRESSION MICRODISCECTOMY 3 LEVELS
Anesthesia: General | Site: Back

## 2020-11-12 MED ORDER — ORAL CARE MOUTH RINSE: 15.0000 mL | Freq: Once | OROMUCOSAL | Status: AC

## 2020-11-12 MED ORDER — LIDOCAINE 2% (20 MG/ML) 5 ML SYRINGE
INTRAMUSCULAR | Status: DC | PRN
  Administered 2020-11-12: 100 mg via INTRAVENOUS

## 2020-11-12 MED ORDER — SUCRALFATE 1 G PO TABS: 1.0000 g | ORAL_TABLET | Freq: Three times a day (TID) | ORAL | Status: DC

## 2020-11-12 MED ORDER — THROMBIN 5000 UNITS EX SOLR: OROMUCOSAL | Status: DC | PRN

## 2020-11-12 MED ORDER — BUPIVACAINE-EPINEPHRINE (PF) 0.5% -1:200000 IJ SOLN: INTRAMUSCULAR | Status: DC | PRN

## 2020-11-12 MED ORDER — PHENYLEPHRINE HCL-NACL 10-0.9 MG/250ML-% IV SOLN
INTRAVENOUS | Status: DC | PRN
  Administered 2020-11-12: 50 ug/min via INTRAVENOUS
  Administered 2020-11-12: 25 ug/min via INTRAVENOUS

## 2020-11-12 MED ORDER — SIMVASTATIN 20 MG PO TABS: 40.0000 mg | ORAL_TABLET | Freq: Every evening | ORAL | Status: DC

## 2020-11-12 MED ORDER — METOPROLOL SUCCINATE ER 25 MG PO TB24
25.0000 mg | ORAL_TABLET | Freq: Every evening | ORAL | Status: DC
  Administered 2020-11-12: 25 mg via ORAL
  Filled 2020-11-12: qty 1

## 2020-11-12 MED ORDER — PHENYLEPHRINE 40 MCG/ML (10ML) SYRINGE FOR IV PUSH (FOR BLOOD PRESSURE SUPPORT)
PREFILLED_SYRINGE | INTRAVENOUS | Status: AC
  Filled 2020-11-12: qty 10

## 2020-11-12 MED ORDER — MIDAZOLAM HCL 5 MG/5ML IJ SOLN
INTRAMUSCULAR | Status: DC | PRN
  Administered 2020-11-12: 1 mg via INTRAVENOUS

## 2020-11-12 MED ORDER — HYDROCODONE-ACETAMINOPHEN 5-325 MG PO TABS: 1.0000 | ORAL_TABLET | ORAL | Status: DC | PRN

## 2020-11-12 MED ORDER — ACETAMINOPHEN 650 MG RE SUPP: 650.0000 mg | RECTAL | Status: DC | PRN

## 2020-11-12 MED ORDER — BACITRACIN ZINC 500 UNIT/GM EX OINT
TOPICAL_OINTMENT | CUTANEOUS | Status: DC | PRN
  Administered 2020-11-12: 1 via TOPICAL

## 2020-11-12 MED ORDER — THROMBIN 5000 UNITS EX SOLR
CUTANEOUS | Status: DC | PRN
  Administered 2020-11-12 (×2): 5000 [IU] via TOPICAL

## 2020-11-12 MED ORDER — FAMOTIDINE 20 MG PO TABS
20.0000 mg | ORAL_TABLET | ORAL | Status: DC
  Administered 2020-11-12: 20 mg via ORAL
  Filled 2020-11-12: qty 1

## 2020-11-12 MED ORDER — ONDANSETRON HCL 4 MG/2ML IJ SOLN: 4.0000 mg | Freq: Four times a day (QID) | INTRAMUSCULAR | Status: DC | PRN

## 2020-11-12 MED ORDER — PROPOFOL 10 MG/ML IV BOLUS
INTRAVENOUS | Status: DC | PRN
  Administered 2020-11-12: 150 mg via INTRAVENOUS

## 2020-11-12 MED ORDER — CELECOXIB 200 MG PO CAPS
200.0000 mg | ORAL_CAPSULE | Freq: Once | ORAL | Status: AC
  Administered 2020-11-12: 200 mg via ORAL
  Filled 2020-11-12: qty 1

## 2020-11-12 MED ORDER — ACETAMINOPHEN 500 MG PO TABS
1000.0000 mg | ORAL_TABLET | Freq: Four times a day (QID) | ORAL | Status: DC
  Administered 2020-11-12 (×2): 1000 mg via ORAL
  Filled 2020-11-12 (×2): qty 2

## 2020-11-12 MED ORDER — GABAPENTIN 100 MG PO CAPS: 200.0000 mg | ORAL_CAPSULE | Freq: Every day | ORAL | Status: DC

## 2020-11-12 MED ORDER — VANCOMYCIN HCL IN DEXTROSE 1-5 GM/200ML-% IV SOLN
1000.0000 mg | Freq: Once | INTRAVENOUS | Status: AC
  Administered 2020-11-12: 1000 mg via INTRAVENOUS
  Filled 2020-11-12: qty 200

## 2020-11-12 MED ORDER — PROMETHAZINE HCL 25 MG/ML IJ SOLN: 6.2500 mg | INTRAMUSCULAR | Status: DC | PRN

## 2020-11-12 MED ORDER — MENTHOL 3 MG MT LOZG: 1.0000 | LOZENGE | OROMUCOSAL | Status: DC | PRN

## 2020-11-12 MED ORDER — SODIUM CHLORIDE 0.9 % IV SOLN: 250.0000 mL | INTRAVENOUS | Status: DC

## 2020-11-12 MED ORDER — ONDANSETRON HCL 4 MG/2ML IJ SOLN
INTRAMUSCULAR | Status: DC | PRN
  Administered 2020-11-12: 4 mg via INTRAVENOUS

## 2020-11-12 MED ORDER — DEXAMETHASONE SODIUM PHOSPHATE 10 MG/ML IJ SOLN
INTRAMUSCULAR | Status: AC
  Filled 2020-11-12: qty 1

## 2020-11-12 MED ORDER — ROCURONIUM BROMIDE 10 MG/ML (PF) SYRINGE
PREFILLED_SYRINGE | INTRAVENOUS | Status: DC | PRN
  Administered 2020-11-12: 70 mg via INTRAVENOUS

## 2020-11-12 MED ORDER — UMECLIDINIUM BROMIDE 62.5 MCG/INH IN AEPB: 1.0000 | INHALATION_SPRAY | Freq: Every day | RESPIRATORY_TRACT | Status: DC | PRN

## 2020-11-12 MED ORDER — OXYCODONE HCL 5 MG PO TABS
ORAL_TABLET | ORAL | Status: AC
  Filled 2020-11-12: qty 1

## 2020-11-12 MED ORDER — OXYCODONE HCL 5 MG PO TABS
5.0000 mg | ORAL_TABLET | ORAL | Status: DC | PRN
Start: 2020-11-12 — End: 2020-11-13
  Administered 2020-11-12: 5 mg via ORAL

## 2020-11-12 MED ORDER — FENTANYL CITRATE (PF) 100 MCG/2ML IJ SOLN
25.0000 ug | INTRAMUSCULAR | Status: DC | PRN
  Administered 2020-11-12: 50 ug via INTRAVENOUS
  Administered 2020-11-12: 25 ug via INTRAVENOUS

## 2020-11-12 MED ORDER — LACTATED RINGERS IV SOLN: INTRAVENOUS | Status: DC

## 2020-11-12 MED ORDER — SUFENTANIL CITRATE 50 MCG/ML IV SOLN
INTRAVENOUS | Status: DC | PRN
  Administered 2020-11-12 (×2): 5 ug via INTRAVENOUS
  Administered 2020-11-12: 10 ug via INTRAVENOUS

## 2020-11-12 MED ORDER — SUFENTANIL CITRATE 50 MCG/ML IV SOLN
INTRAVENOUS | Status: AC
  Filled 2020-11-12: qty 1

## 2020-11-12 MED ORDER — DEXAMETHASONE SODIUM PHOSPHATE 10 MG/ML IJ SOLN
INTRAMUSCULAR | Status: DC | PRN
  Administered 2020-11-12: 10 mg via INTRAVENOUS

## 2020-11-12 MED ORDER — LIDOCAINE 2% (20 MG/ML) 5 ML SYRINGE
INTRAMUSCULAR | Status: AC
  Filled 2020-11-12: qty 5

## 2020-11-12 MED ORDER — DOCUSATE SODIUM 100 MG PO CAPS: 100.0000 mg | ORAL_CAPSULE | Freq: Two times a day (BID) | ORAL | 0 refills | Status: DC

## 2020-11-12 MED ORDER — CHLORHEXIDINE GLUCONATE 0.12 % MT SOLN
15.0000 mL | Freq: Once | OROMUCOSAL | Status: AC
  Administered 2020-11-12: 15 mL via OROMUCOSAL
  Filled 2020-11-12: qty 15

## 2020-11-12 MED ORDER — IRBESARTAN 150 MG PO TABS: 150.0000 mg | ORAL_TABLET | Freq: Every day | ORAL | Status: DC

## 2020-11-12 MED ORDER — AMLODIPINE BESYLATE 5 MG PO TABS: 5.0000 mg | ORAL_TABLET | Freq: Every day | ORAL | Status: DC

## 2020-11-12 MED ORDER — CHLORHEXIDINE GLUCONATE CLOTH 2 % EX PADS: 6.0000 | MEDICATED_PAD | Freq: Once | CUTANEOUS | Status: DC

## 2020-11-12 MED ORDER — CYCLOBENZAPRINE HCL 10 MG PO TABS
10.0000 mg | ORAL_TABLET | Freq: Three times a day (TID) | ORAL | Status: DC | PRN
  Administered 2020-11-12: 10 mg via ORAL
  Filled 2020-11-12: qty 1

## 2020-11-12 MED ORDER — IRBESARTAN-HYDROCHLOROTHIAZIDE 150-12.5 MG PO TABS: 1.0000 | ORAL_TABLET | Freq: Every day | ORAL | Status: DC

## 2020-11-12 MED ORDER — HYDROCHLOROTHIAZIDE 12.5 MG PO CAPS: 12.5000 mg | ORAL_CAPSULE | Freq: Every day | ORAL | Status: DC

## 2020-11-12 MED ORDER — SODIUM CHLORIDE 0.9% FLUSH: 3.0000 mL | INTRAVENOUS | Status: DC | PRN

## 2020-11-12 MED ORDER — PROPOFOL 10 MG/ML IV BOLUS
INTRAVENOUS | Status: AC
  Filled 2020-11-12: qty 20

## 2020-11-12 MED ORDER — IBUPROFEN 200 MG PO TABS: 400.0000 mg | ORAL_TABLET | Freq: Four times a day (QID) | ORAL | Status: DC | PRN

## 2020-11-12 MED ORDER — DOCUSATE SODIUM 100 MG PO CAPS
100.0000 mg | ORAL_CAPSULE | Freq: Two times a day (BID) | ORAL | Status: DC
  Administered 2020-11-12: 100 mg via ORAL
  Filled 2020-11-12: qty 1

## 2020-11-12 MED ORDER — OXYCODONE HCL 5 MG PO TABS
10.0000 mg | ORAL_TABLET | ORAL | Status: DC | PRN
  Filled 2020-11-12: qty 2

## 2020-11-12 MED ORDER — CYCLOBENZAPRINE HCL 10 MG PO TABS
ORAL_TABLET | ORAL | Status: AC
  Filled 2020-11-12: qty 1

## 2020-11-12 MED ORDER — BUPIVACAINE LIPOSOME 1.3 % IJ SUSP
20.0000 mL | Freq: Once | INTRAMUSCULAR | Status: DC
  Filled 2020-11-12: qty 20

## 2020-11-12 MED ORDER — CLONIDINE HCL 0.1 MG PO TABS
0.1000 mg | ORAL_TABLET | Freq: Every day | ORAL | Status: DC
  Filled 2020-11-12: qty 1

## 2020-11-12 MED ORDER — SUGAMMADEX SODIUM 200 MG/2ML IV SOLN
INTRAVENOUS | Status: DC | PRN
  Administered 2020-11-12: 200 mg via INTRAVENOUS

## 2020-11-12 MED ORDER — VANCOMYCIN HCL IN DEXTROSE 1-5 GM/200ML-% IV SOLN
1000.0000 mg | INTRAVENOUS | Status: AC
  Administered 2020-11-12: 1000 mg via INTRAVENOUS
  Filled 2020-11-12: qty 200

## 2020-11-12 MED ORDER — BACITRACIN ZINC 500 UNIT/GM EX OINT
TOPICAL_OINTMENT | CUTANEOUS | Status: AC
  Filled 2020-11-12: qty 28.35

## 2020-11-12 MED ORDER — ACETAMINOPHEN 325 MG PO TABS: 650.0000 mg | ORAL_TABLET | ORAL | Status: DC | PRN

## 2020-11-12 MED ORDER — BUPIVACAINE LIPOSOME 1.3 % IJ SUSP
INTRAMUSCULAR | Status: DC | PRN
  Administered 2020-11-12: 20 mL

## 2020-11-12 MED ORDER — BUPIVACAINE-EPINEPHRINE (PF) 0.25% -1:200000 IJ SOLN
INTRAMUSCULAR | Status: DC | PRN
  Administered 2020-11-12: 10 mL

## 2020-11-12 MED ORDER — ATORVASTATIN CALCIUM 10 MG PO TABS
20.0000 mg | ORAL_TABLET | Freq: Every day | ORAL | Status: DC
  Administered 2020-11-12: 20 mg via ORAL
  Filled 2020-11-12: qty 2

## 2020-11-12 MED ORDER — THROMBIN 5000 UNITS EX SOLR
CUTANEOUS | Status: AC
  Filled 2020-11-12: qty 15000

## 2020-11-12 MED ORDER — HYDROCODONE-ACETAMINOPHEN 5-325 MG PO TABS: 1.0000 | ORAL_TABLET | ORAL | 0 refills | Status: DC | PRN

## 2020-11-12 MED ORDER — ROCURONIUM BROMIDE 10 MG/ML (PF) SYRINGE
PREFILLED_SYRINGE | INTRAVENOUS | Status: AC
  Filled 2020-11-12: qty 10

## 2020-11-12 MED ORDER — ONDANSETRON HCL 4 MG/2ML IJ SOLN
INTRAMUSCULAR | Status: AC
  Filled 2020-11-12: qty 2

## 2020-11-12 MED ORDER — ALBUMIN HUMAN 5 % IV SOLN: INTRAVENOUS | Status: DC | PRN

## 2020-11-12 MED ORDER — MORPHINE SULFATE (PF) 4 MG/ML IV SOLN: 4.0000 mg | INTRAVENOUS | Status: DC | PRN

## 2020-11-12 MED ORDER — FENTANYL CITRATE (PF) 100 MCG/2ML IJ SOLN
INTRAMUSCULAR | Status: AC
  Administered 2020-11-12: 25 ug via INTRAVENOUS
  Filled 2020-11-12: qty 2

## 2020-11-12 MED ORDER — ZOLPIDEM TARTRATE 5 MG PO TABS: 5.0000 mg | ORAL_TABLET | Freq: Every evening | ORAL | Status: DC | PRN

## 2020-11-12 MED ORDER — MIDAZOLAM HCL 2 MG/2ML IJ SOLN
INTRAMUSCULAR | Status: AC
  Filled 2020-11-12: qty 2

## 2020-11-12 MED ORDER — ONDANSETRON HCL 4 MG PO TABS: 4.0000 mg | ORAL_TABLET | Freq: Four times a day (QID) | ORAL | Status: DC | PRN

## 2020-11-12 MED ORDER — BUPIVACAINE HCL (PF) 0.5 % IJ SOLN
INTRAMUSCULAR | Status: AC
  Filled 2020-11-12: qty 30

## 2020-11-12 MED ORDER — SODIUM CHLORIDE 0.9% FLUSH: 3.0000 mL | Freq: Two times a day (BID) | INTRAVENOUS | Status: DC

## 2020-11-12 MED ORDER — PHENOL 1.4 % MT LIQD: 1.0000 | OROMUCOSAL | Status: DC | PRN

## 2020-11-12 MED ORDER — BISACODYL 10 MG RE SUPP: 10.0000 mg | Freq: Every day | RECTAL | Status: DC | PRN

## 2020-11-12 MED ORDER — SODIUM CHLORIDE (PF) 0.9 % IJ SOLN
INTRAMUSCULAR | Status: AC
  Filled 2020-11-12: qty 10

## 2020-11-12 MED ORDER — HEMOSTATIC AGENTS (NO CHARGE) OPTIME
TOPICAL | Status: DC | PRN
  Administered 2020-11-12: 1 via TOPICAL

## 2020-11-12 MED ORDER — 0.9 % SODIUM CHLORIDE (POUR BTL) OPTIME
TOPICAL | Status: DC | PRN
  Administered 2020-11-12: 1000 mL

## 2020-11-12 SURGICAL SUPPLY — 50 items
APL SKNCLS STERI-STRIP NONHPOA (GAUZE/BANDAGES/DRESSINGS) ×1
BAND INSRT 18 STRL LF DISP RB (MISCELLANEOUS) ×2
BAND RUBBER #18 3X1/16 STRL (MISCELLANEOUS) ×4 IMPLANT
BENZOIN TINCTURE PRP APPL 2/3 (GAUZE/BANDAGES/DRESSINGS) ×2 IMPLANT
BLADE CLIPPER SURG (BLADE) IMPLANT
BUR MATCHSTICK NEURO 3.0 LAGG (BURR) ×2 IMPLANT
BUR PRECISION FLUTE 6.0 (BURR) ×2 IMPLANT
CANISTER SUCT 3000ML PPV (MISCELLANEOUS) ×2 IMPLANT
CARTRIDGE OIL MAESTRO DRILL (MISCELLANEOUS) ×1 IMPLANT
COVER WAND RF STERILE (DRAPES) ×1 IMPLANT
DIFFUSER DRILL AIR PNEUMATIC (MISCELLANEOUS) ×2 IMPLANT
DRAPE LAPAROTOMY 100X72X124 (DRAPES) ×2 IMPLANT
DRAPE MICROSCOPE LEICA (MISCELLANEOUS) ×2 IMPLANT
DRAPE SURG 17X23 STRL (DRAPES) ×8 IMPLANT
DRSG OPSITE POSTOP 4X6 (GAUZE/BANDAGES/DRESSINGS) ×2 IMPLANT
ELECT BLADE 4.0 EZ CLEAN MEGAD (MISCELLANEOUS) ×2
ELECT REM PT RETURN 9FT ADLT (ELECTROSURGICAL) ×2
ELECTRODE BLDE 4.0 EZ CLN MEGD (MISCELLANEOUS) ×1 IMPLANT
ELECTRODE REM PT RTRN 9FT ADLT (ELECTROSURGICAL) ×1 IMPLANT
GAUZE 4X4 16PLY RFD (DISPOSABLE) IMPLANT
GAUZE SPONGE 4X4 12PLY STRL (GAUZE/BANDAGES/DRESSINGS) ×1 IMPLANT
GLOVE BIO SURGEON STRL SZ 6.5 (GLOVE) ×2 IMPLANT
GLOVE BIO SURGEON STRL SZ8 (GLOVE) ×2 IMPLANT
GLOVE BIO SURGEON STRL SZ8.5 (GLOVE) ×2 IMPLANT
GLOVE EXAM NITRILE XL STR (GLOVE) IMPLANT
GLOVE SURG SS PI 7.5 STRL IVOR (GLOVE) ×4 IMPLANT
GLOVE SURG UNDER POLY LF SZ6.5 (GLOVE) ×2 IMPLANT
GOWN STRL REUS W/ TWL LRG LVL3 (GOWN DISPOSABLE) IMPLANT
GOWN STRL REUS W/ TWL XL LVL3 (GOWN DISPOSABLE) ×3 IMPLANT
GOWN STRL REUS W/TWL 2XL LVL3 (GOWN DISPOSABLE) IMPLANT
GOWN STRL REUS W/TWL LRG LVL3 (GOWN DISPOSABLE) ×2
GOWN STRL REUS W/TWL XL LVL3 (GOWN DISPOSABLE) ×6
KIT BASIN OR (CUSTOM PROCEDURE TRAY) ×2 IMPLANT
KIT TURNOVER KIT B (KITS) ×2 IMPLANT
NEEDLE HYPO 21X1.5 SAFETY (NEEDLE) ×2 IMPLANT
NEEDLE HYPO 22GX1.5 SAFETY (NEEDLE) ×2 IMPLANT
NS IRRIG 1000ML POUR BTL (IV SOLUTION) ×2 IMPLANT
OIL CARTRIDGE MAESTRO DRILL (MISCELLANEOUS) ×2
PACK LAMINECTOMY NEURO (CUSTOM PROCEDURE TRAY) ×2 IMPLANT
PAD ARMBOARD 7.5X6 YLW CONV (MISCELLANEOUS) ×6 IMPLANT
PATTIES SURGICAL .5 X1 (DISPOSABLE) IMPLANT
SPONGE LAP 4X18 RFD (DISPOSABLE) ×1 IMPLANT
SPONGE SURGIFOAM ABS GEL SZ50 (HEMOSTASIS) ×2 IMPLANT
STRIP CLOSURE SKIN 1/2X4 (GAUZE/BANDAGES/DRESSINGS) ×2 IMPLANT
SUT VIC AB 1 CT1 18XBRD ANBCTR (SUTURE) ×2 IMPLANT
SUT VIC AB 1 CT1 8-18 (SUTURE)
SUT VIC AB 2-0 CP2 18 (SUTURE) ×4 IMPLANT
TOWEL GREEN STERILE (TOWEL DISPOSABLE) ×2 IMPLANT
TOWEL GREEN STERILE FF (TOWEL DISPOSABLE) ×2 IMPLANT
WATER STERILE IRR 1000ML POUR (IV SOLUTION) ×2 IMPLANT

## 2020-11-12 NOTE — Discharge Summary (Signed)
Physician Discharge Summary  Patient ID: Elizabeth [name] MRN: [id number] DOB/AGE: [id number] y.o.  Admit date: 11/12/2020 Discharge date: 11/12/2020  Admission Diagnoses: Lumbar spinal stenosis with neurogenic claudication, lumbar spine listhesis, lumbar radiculopathy, lumbago  Discharge Diagnoses: The same Active Problems:   Spinal stenosis of lumbar region with neurogenic claudication   Discharged Condition: good  Hospital Course: I performed an L3-4, L4-5 and L5-S1 laminectomy on the patient on 11/12/2020.  The surgery went well.  The patient's postoperative course was unremarkable.  On the day of surgery the patient requested discharge to home.  Her granddaughter is going to stay with her.  She was given written and oral discharge instructions.  All her questions were answered.  Consults: PT, OT, care management Significant Diagnostic Studies: None Treatments: L3-4, L4-5 and L5-S1 laminectomy/laminotomy using microdissection Discharge Exam: Blood pressure 115/68, pulse 81, temperature 98.2 F (36.8 C), temperature source Oral, resp. rate 18, height 5\' 1"  (1.549 m), weight 50 kg, SpO2 95 %. The patient is alert and pleasant.  She looks well.  Her strength is normal.  Disposition: Home  Discharge Instructions     Remove dressing in 72 hours   Complete by: As directed    Call MD for:  difficulty breathing, headache or visual disturbances   Complete by: As directed    Call MD for:  extreme fatigue   Complete by: As directed    Call MD for:  hives   Complete by: As directed    Call MD for:  persistant dizziness or light-headedness   Complete by: As directed    Call MD for:  persistant nausea and vomiting   Complete by: As directed    Call MD for:  redness, tenderness, or signs of infection (pain, swelling, redness, odor or green/yellow discharge around incision site)   Complete by: As directed    Call MD for:  severe uncontrolled pain   Complete by: As directed    Call MD  for:  temperature >100.4   Complete by: As directed    Diet - low sodium heart healthy   Complete by: As directed    Discharge instructions   Complete by: As directed    Call [phone] for a followup appointment. Take a stool softener while you are using pain medications.   Driving Restrictions   Complete by: As directed    Do not drive for 2 weeks.   Increase activity slowly   Complete by: As directed    Lifting restrictions   Complete by: As directed    Do not lift more than 5 pounds. No excessive bending or twisting.   May shower / Bathe   Complete by: As directed    Remove the dressing for 3 days after surgery.  You may shower, but leave the incision alone.     Allergies as of 11/12/2020      Reactions   Other    Chlorexolone   Pollen Extract    Allergic to tree pollens   Tegretol [carbamazepine]    Unknown    Lisinopril Rash   Tetracyclines & Related Rash      Medication List    TAKE these medications   amLODipine 5 MG tablet Commonly known as: NORVASC Take 5 mg by mouth daily.   aspirin 81 MG tablet Take 81 mg by mouth every evening.   cloNIDine 0.1 MG tablet Commonly known as: CATAPRES Take 0.1 mg by mouth at bedtime.   Coenzyme Q10 100 MG capsule Take  100 mg by mouth daily.   docusate sodium 100 MG capsule Commonly known as: COLACE Take 1 capsule (100 mg total) by mouth 2 (two) times daily.   famotidine 20 MG tablet Commonly known as: PEPCID Take 20 mg by mouth every other day.   gabapentin 100 MG capsule Commonly known as: NEURONTIN Take 2 capsules (200 mg total) by mouth at bedtime. PATIENT NEEDS OFFICE VISIT FOR ADDITIONAL REFILLS   HYDROcodone-acetaminophen 5-325 MG tablet Commonly known as: NORCO/VICODIN Take 1 tablet by mouth every 4 (four) hours as needed for moderate pain.   ibuprofen 200 MG tablet Commonly known as: ADVIL Take 400 mg by mouth every 6 (six) hours as needed for moderate pain.   Incruse Ellipta 62.5 MCG/INH  Aepb Generic drug: umeclidinium bromide Inhale 1 puff into the lungs daily as needed (asthma).   irbesartan-hydrochlorothiazide 150-12.5 MG tablet Commonly known as: AVALIDE Take 1 tablet by mouth daily.   metoprolol succinate 25 MG 24 hr tablet Commonly known as: TOPROL-XL Take 25 mg by mouth every evening.   multivitamin tablet Take 1 tablet by mouth daily.   omeprazole 20 MG capsule Commonly known as: PRILOSEC Take 1 capsule (20 mg total) by mouth daily.   simvastatin 40 MG tablet Commonly known as: ZOCOR Take 40 mg by mouth every evening.   sucralfate 1 g tablet Commonly known as: Carafate Take 1 tablet (1 g total) by mouth 4 (four) times daily -  with meals and at bedtime for 14 days.   Turmeric 500 MG Caps Take 500 mg by mouth daily.   vitamin B-12 100 MCG tablet Commonly known as: CYANOCOBALAMIN Take 100 mcg by mouth daily.   vitamin E 180 MG (400 UNITS) capsule Take 400 Units by mouth daily.        Signed: Ophelia Charter 11/12/2020, 5:40 PM

## 2020-11-12 NOTE — H&P (Signed)
Subjective: The patient is an 80 year old white female who has complained of back and right greater left leg pain.  She has failed medical management and was worked up with a lumbar MRI and lumbar x-rays which demonstrated lumbar spinal stenosis.  I discussed the various treatment options with her.  She has decided to proceed with surgery.  Past Medical History:  Diagnosis Date  . Abdominal cramping    possibly irritable bowel syndrome versus adhesions secondary to multiple surgeries and abdominal inflammation and 2009 secondary to diverticulitis and diverticular phlegmon  . Allergic rhinitis   . Arthritis   . Carotid artery disease (HCC)    94-17% RICA, < 40% LICA 81/4/48 [name] ([facility])  . Cataract   . Chest pain syndrome 2004   with adenosine Cardiolite that was normal  . Chronic hoarseness   . COPD (chronic obstructive pulmonary disease) ([place])   . GERD (gastroesophageal reflux disease)   . Heart murmur    Mild AS, AI. AV pk grad 18.79 mmHg, mn grad 11.20 mmHg, AVA (VTI) 1.05 cm2 10/10/20, 3 year f/u rec ([facility])  . History of kidney stones   . History of renal calculi   . Hypercholesterolemia   . Hypertension   . Incontinence    Cough incontinence  . Mild depression ([place])   . Neuromuscular disorder (HCC)    neuropathy bilateral lower extremities  . Osteopenia   . Peripheral vascular disease ([place])    with femoral and carotid bruits  . PONV (postoperative nausea and vomiting)   . Tobacco dependence   . Vitamin D deficiency     Past Surgical History:  Procedure Laterality Date  . APPENDECTOMY    . BREAST BIOPSY    . CATARACT EXTRACTION, BILATERAL    . COLON SURGERY     2009  . COSMETIC SURGERY     on neck and eyes  . DILATION AND CURETTAGE OF UTERUS    . EYE SURGERY     2014  . FEMUR SURGERY  12/2007   left femur surgery--for femoral neck fracture status post fall   . FRACTURE SURGERY     broke left leg and left arm- 2012  . LIPOSUCTION    . OTHER SURGICAL HISTORY      sigmoid colectomy  . SHOULDER SURGERY     arthroscopic shoulder surgery   . TONSILLECTOMY    . TUBAL LIGATION      Allergies  Allergen Reactions  . Other     Chlorexolone  . Pollen Extract     Allergic to tree pollens  . Tegretol [Carbamazepine]     Unknown   . Lisinopril Rash  . Tetracyclines & Related Rash    Social History   Tobacco Use  . Smoking status: Current Every Day Smoker    Packs/day: 0.50    Years: 60.00    Pack years: 30.00    Types: Cigarettes  . Smokeless tobacco: Never Used  Substance Use Topics  . Alcohol use: Yes    Alcohol/week: 0.0 standard drinks    Comment: Occ glass of wine    Family History  Problem Relation Age of Onset  . Heart disease Mother   . Stroke Mother    Prior to Admission medications   Medication Sig Start Date End Date Taking? Authorizing Provider  amLODipine (NORVASC) 5 MG tablet Take 5 mg by mouth daily. 02/14/20  Yes [provider]  aspirin 81 MG tablet Take 81 mg by mouth every evening.  Yes [provider]  cloNIDine (CATAPRES) 0.1 MG tablet Take 0.1 mg by mouth at bedtime.    Yes [provider]  Coenzyme Q10 100 MG capsule Take 100 mg by mouth daily.   Yes [provider]  famotidine (PEPCID) 20 MG tablet Take 20 mg by mouth every other day.    Yes [provider]  gabapentin (NEURONTIN) 100 MG capsule Take 2 capsules (200 mg total) by mouth at bedtime. PATIENT NEEDS OFFICE VISIT FOR ADDITIONAL REFILLS 07/30/15  Yes [name], MD  ibuprofen (ADVIL,MOTRIN) 200 MG tablet Take 400 mg by mouth every 6 (six) hours as needed for moderate pain.    Yes [provider]  INCRUSE ELLIPTA 62.5 MCG/INH AEPB Inhale 1 puff into the lungs daily as needed (asthma). 03/19/20  Yes [provider]  irbesartan-hydrochlorothiazide (AVALIDE) 150-12.5 MG per tablet Take 1 tablet by mouth daily.   Yes [provider]  metoprolol succinate (TOPROL-XL) 25 MG 24 hr tablet  Take 25 mg by mouth every evening.    Yes [provider]  Multiple Vitamin (MULTIVITAMIN) tablet Take 1 tablet by mouth daily.   Yes [provider]  simvastatin (ZOCOR) 40 MG tablet Take 40 mg by mouth every evening.    Yes [provider]  Turmeric 500 MG CAPS Take 500 mg by mouth daily.   Yes [provider]  vitamin B-12 (CYANOCOBALAMIN) 100 MCG tablet Take 100 mcg by mouth daily.   Yes [provider]  vitamin E 180 MG (400 UNITS) capsule Take 400 Units by mouth daily.   Yes [provider]  omeprazole (PRILOSEC) 20 MG capsule Take 1 capsule (20 mg total) by mouth daily. 04/11/20 05/11/20  Curatolo, Adam, DO  sucralfate (CARAFATE) 1 g tablet Take 1 tablet (1 g total) by mouth 4 (four) times daily -  with meals and at bedtime for 14 days. 04/11/20 04/25/20  Lennice Sites, DO     Review of Systems  Positive ROS: As above  All other systems have been reviewed and were otherwise negative with the exception of those mentioned in the HPI and as above.  Objective: Vital signs in last 24 hours: Temp:  [98.2 F (36.8 C)] 98.2 F (36.8 C) (01/10 0558) Pulse Rate:  [87] 87 (01/10 0558) Resp:  [18] 18 (01/10 0558) BP: (158)/(53) 158/53 (01/10 0558) SpO2:  [100 %] 100 % (01/10 0558) Weight:  [50 kg] 50 kg (01/10 0558) Estimated body mass index is 20.83 kg/m as calculated from the following:   Height as of this encounter: 5\' 1"  (1.549 m).   Weight as of this encounter: 50 kg.   General Appearance: Alert Head: Normocephalic, without obvious abnormality, atraumatic Eyes: PERRL, conjunctiva/corneas clear, EOM's intact,    Ears: Normal  Throat: Normal  Neck: Supple, Back: unremarkable Lungs: Clear to auscultation bilaterally, respirations unlabored Heart: Regular rate and rhythm, no murmur, rub or gallop Abdomen: Soft, non-tender Extremities: Extremities normal, atraumatic, no cyanosis or edema Skin: unremarkable  NEUROLOGIC:   Mental  status: alert and oriented,Motor Exam - grossly normal Sensory Exam - grossly normal Reflexes:  Coordination - grossly normal Gait - grossly normal Balance - grossly normal Cranial Nerves: I: smell Not tested  II: visual acuity  OS: Normal  OD: Normal   II: visual fields Full to confrontation  II: pupils Equal, round, reactive to light  III,VII: ptosis None  III,IV,VI: extraocular muscles  Full ROM  V: mastication Normal  V: facial light touch sensation  Normal  V,VII: corneal reflex  Present  VII: facial muscle function - upper  Normal  VII: facial muscle function - lower Normal  VIII: hearing Not tested  IX: soft palate elevation  Normal  IX,X: gag reflex Present  XI: trapezius strength  5/5  XI: sternocleidomastoid strength 5/5  XI: neck flexion strength  5/5  XII: tongue strength  Normal    Data Review Lab Results  Component Value Date   WBC 9.1 11/08/2020   HGB 13.0 11/08/2020   HCT 40.4 11/08/2020   MCV 95.7 11/08/2020   PLT 222 11/08/2020   Lab Results  Component Value Date   NA 138 11/08/2020   K 3.6 11/08/2020   CL 100 11/08/2020   CO2 28 11/08/2020   BUN 9 11/08/2020   CREATININE 0.84 11/08/2020   GLUCOSE 106 (H) 11/08/2020   Lab Results  Component Value Date   INR 1.71 (H) 04/28/2011    Assessment/Plan: Lumbar spinal stenosis, lumbago, lumbar radiculopathy, neurogenic claudication: I have discussed the situation with the patient.  I reviewed her imaging studies with her and pointed out the abnormalities.  We have discussed the various treatment options including surgery.  I have described the surgical treatment option of an L2-3, L3-4 and L4-5 laminotomy/laminectomy/foraminotomies.  I have shown her surgical models.  I have given her a surgical pamphlet.  We have discussed the risk, benefits, alternatives, expected postoperative course, and likelihood of achieving our goals with surgery.  I have answered all her questions.  She has decided to proceed  with surgery.   Ophelia Charter 11/12/2020 7:20 AM

## 2020-11-12 NOTE — Anesthesia Postprocedure Evaluation (Signed)
Anesthesia Post Note  Patient: Jeanell Mangan Goleman  Procedure(s) Performed: LAMINECTOMY AND FORAMINOTOMY Lumbar three four, Lumbar four five, Lumbar Five Sacral one (N/A Back)     Patient location during evaluation: PACU Anesthesia Type: General Level of consciousness: sedated Pain management: pain level controlled Vital Signs Assessment: post-procedure vital signs reviewed and stable Respiratory status: spontaneous breathing and respiratory function stable Cardiovascular status: stable Postop Assessment: no apparent nausea or vomiting Anesthetic complications: no   No complications documented.  Last Vitals:  Vitals:   11/12/20 1056 11/12/20 1126  BP: (!) 140/53 (!) 134/52  Pulse: 97 88  Resp: (!) 26 18  Temp:  (!) 36.3 C  SpO2: 97% 95%    Last Pain:  Vitals:   11/12/20 1110  TempSrc:   PainSc: 2                  Bunnie Lederman DANIEL

## 2020-11-12 NOTE — Transfer of Care (Signed)
Immediate Anesthesia Transfer of Care Note  Patient: Elizabeth [name]  Procedure(s) Performed: LAMINECTOMY AND FORAMINOTOMY Lumbar three four, Lumbar four five, Lumbar Five Sacral one (N/A Back)  Patient Location: PACU  Anesthesia Type:General  Level of Consciousness: awake and alert   Airway & Oxygen Therapy: Patient Spontanous Breathing and Patient connected to nasal cannula oxygen  Post-op Assessment: Report given to RN and Post -op Vital signs reviewed and stable  Post vital signs: Reviewed and stable  Last Vitals:  Vitals Value Taken Time  BP 131/53 11/12/20 1010  Temp    Pulse 87 11/12/20 1018  Resp 22 11/12/20 1018  SpO2 100 % 11/12/20 1018  Vitals shown include unvalidated device data.  Last Pain:  Vitals:   11/12/20 0633  TempSrc:   PainSc: 6       Patients Stated Pain Goal: 6 (14/48/18 5631)  Complications: No complications documented.

## 2020-11-12 NOTE — Evaluation (Signed)
Physical Therapy Evaluation Patient Details Name: Elizabeth [name] MRN: [id number] DOB: 17-Nov-1940 Today's Date: 11/12/2020   History of Present Illness  Pt is an 80 y/o female s/p L3-4 and L5-S1 foraminotomy and L4-5 laminectomy. PMH includes CAD, COPD, and HTN.  Clinical Impression  Patient evaluated by Physical Therapy with no further acute PT needs identified. All education has been completed and the patient has no further questions. Pt overall steady with mobility tasks this session, only requiring supervision to min guard for safety. Educated about generalized walking program, car transfers, and how to perform ADL tasks while maintaining precautions.  See below for any follow-up Physical Therapy or equipment needs. PT is signing off. Thank you for this referral. If needs change, please re-consult.      Follow Up Recommendations No PT follow up    Equipment Recommendations  None recommended by PT    Recommendations for Other Services       Precautions / Restrictions Precautions Precautions: Back Precaution Booklet Issued: Yes (comment) Precaution Comments: Reviewed back precautions. Restrictions Weight Bearing Restrictions: No      Mobility  Bed Mobility Overal bed mobility: Needs Assistance Bed Mobility: Sidelying to Sit;Rolling;Sit to Sidelying Rolling: Supervision Sidelying to sit: Supervision     Sit to sidelying: Supervision General bed mobility comments: Supervision for safety. cues for log roll technique.    Transfers Overall transfer level: Needs assistance Equipment used: None Transfers: Sit to/from Stand Sit to Stand: Supervision         General transfer comment: Supervision for safety  Ambulation/Gait Ambulation/Gait assistance: Supervision;Min guard Gait Distance (Feet): 150 Feet Assistive device: IV Pole Gait Pattern/deviations: Step-through pattern;Decreased stride length Gait velocity: Decreased   General Gait Details: Guarded gait, but no  overt LOB noted. Min guard to supervision for safety, but no physical assist required. Educated about walking program to perform at home.  Stairs            Wheelchair Mobility    Modified Rankin (Stroke Patients Only)       Balance Overall balance assessment: No apparent balance deficits (not formally assessed)                                           Pertinent Vitals/Pain Pain Assessment: Faces Faces Pain Scale: Hurts little more Pain Location: back Pain Descriptors / Indicators: Aching;Operative site guarding Pain Intervention(s): Monitored during session;Limited activity within patient's tolerance;Repositioned    Home Living Family/patient expects to be discharged to:: Private residence Living Arrangements: Children;Other (Comment) (granddaughter) Available Help at Discharge: Family;Available 24 hours/day Type of Home: House Home Access: Level entry     Home Layout: One level Home Equipment: Walker - 2 wheels;Cane - single point;Shower seat - built in;Wheelchair - manual      Prior Function Level of Independence: Independent               Hand Dominance        Extremity/Trunk Assessment   Upper Extremity Assessment Upper Extremity Assessment: Overall WFL for tasks assessed    Lower Extremity Assessment Lower Extremity Assessment: Generalized weakness;RLE deficits/detail;LLE deficits/detail RLE Deficits / Details: Reports numbness in bilateral LEs at baseline LLE Deficits / Details: Reports numbness in bilateral LEs at baseline    Cervical / Trunk Assessment Cervical / Trunk Assessment: Other exceptions Cervical / Trunk Exceptions: s/p lumbar surgery  Communication   Communication: No difficulties  Cognition Arousal/Alertness: Awake/alert Behavior During Therapy: WFL for tasks assessed/performed Overall Cognitive Status: Within Functional Limits for tasks assessed                                         General Comments General comments (skin integrity, edema, etc.): Educated about how to maintain precautions during ADL tasks and had pt demonstrate pulling socks up using proper technique in sitting    Exercises     Assessment/Plan    PT Assessment Patent does not need any further PT services  PT Problem List         PT Treatment Interventions      PT Goals (Current goals can be found in the Care Plan section)  Acute Rehab PT Goals Patient Stated Goal: to go home PT Goal Formulation: With patient Time For Goal Achievement: 11/12/20 Potential to Achieve Goals: Good    Frequency     Barriers to discharge        Co-evaluation               AM-PAC PT "6 Clicks" Mobility  Outcome Measure Help needed turning from your back to your side while in a flat bed without using bedrails?: None Help needed moving from lying on your back to sitting on the side of a flat bed without using bedrails?: None Help needed moving to and from a bed to a chair (including a wheelchair)?: None Help needed standing up from a chair using your arms (e.g., wheelchair or bedside chair)?: None Help needed to walk in hospital room?: A Little Help needed climbing 3-5 steps with a railing? : A Little 6 Click Score: 22    End of Session Equipment Utilized During Treatment: Gait belt Activity Tolerance: Patient tolerated treatment well Patient left: in bed;with call bell/phone within reach;with family/visitor present Nurse Communication: Mobility status PT Visit Diagnosis: Other abnormalities of gait and mobility (R26.89)    Time: 7425-9563 PT Time Calculation (min) (ACUTE ONLY): 21 min   Charges:   PT Evaluation $PT Eval Low Complexity: 1 Low          Lou Miner, DPT  Acute Rehabilitation Services  Pager: [phone] Office: [phone]   Rudean Hitt 11/12/2020, 2:09 PM

## 2020-11-12 NOTE — Progress Notes (Signed)
Patient is discharged from room 3C08 at this time. Alert and in stable condition. IV site d/c'd and instructions read to patient and son with understanding verbalized and all questions answered. Left unit via wheelchair with all belongings at side. 

## 2020-11-12 NOTE — Progress Notes (Signed)
OT Cancellation Note  Patient Details Name: MAYLEA SORIA MRN: [id number] DOB: 1941/09/19   Cancelled Treatment:    Reason Eval/Treat Not Completed: Spoke with PT who provided education, mobilization OT screened, no needs identified, will sign off.   Merri Ray Belal Scallon 11/12/2020, 1:41 PM   Jesse Sans OTR/L Acute Rehabilitation Services Pager: [phone] Office: [phone]

## 2020-11-12 NOTE — Progress Notes (Signed)
Pharmacy Antibiotic Note  CHANDANI ROGOWSKI is a 80 y.o. female admitted on 11/12/2020 for spinal procedure.  Pharmacy has been consulted for vancomycin dosing for surgical prophylaxis.  Patient does not have a drain.  Pre-op vancomycin dose given at 0715.  Plan: Vanc 1gm IV x 1 at [place] will sign off   Height: 5\' 1"  (154.9 cm) Weight: 50 kg (110 lb 3.7 oz) IBW/kg (Calculated) : 47.8  Temp (24hrs), Avg:97.7 F (36.5 C), Min:97.2 F (36.2 C), Max:98.2 F (36.8 C)  Recent Labs  Lab 11/08/20 1400  WBC 9.1  CREATININE 0.84    Estimated Creatinine Clearance: 40.3 mL/min (by C-G formula based on SCr of 0.84 mg/dL).    Allergies  Allergen Reactions  . Other     Chlorexolone  . Pollen Extract     Allergic to tree pollens  . Tegretol [Carbamazepine]     Unknown   . Lisinopril Rash  . Tetracyclines & Related Rash    Jaquell Seddon D. Mina Marble, PharmD, BCPS, [place] 11/12/2020, 12:11 PM

## 2020-11-12 NOTE — Anesthesia Procedure Notes (Signed)
Procedure Name: Intubation Date/Time: 11/12/2020 7:35 AM Performed by: Moshe Salisbury, CRNA Pre-anesthesia Checklist: Patient identified, Emergency Drugs available, Suction available and Patient being monitored Patient Re-evaluated:Patient Re-evaluated prior to induction Oxygen Delivery Method: Circle System Utilized Preoxygenation: Pre-oxygenation with 100% oxygen Induction Type: IV induction Ventilation: Mask ventilation without difficulty Laryngoscope Size: Mac and 3 Grade View: Grade II Tube type: Oral Tube size: 7.5 mm Number of attempts: 1 Airway Equipment and Method: Stylet Placement Confirmation: ETT inserted through vocal cords under direct vision,  positive ETCO2 and breath sounds checked- equal and bilateral Secured at: 21 cm Tube secured with: Tape Dental Injury: Teeth and Oropharynx as per pre-operative assessment

## 2020-11-12 NOTE — Discharge Instructions (Signed)

## 2020-11-12 NOTE — Op Note (Signed)
Brief history: The patient is an 80 year old white female who has complained of back and leg pain consistent with neurogenic claudication.  She has failed medical management and was worked up with a lumbar MRI which demonstrated the patient had multilevel lumbar stenosis.  I discussed the various treatment options with her.  She has decided proceed with surgery after weighing the risk, benefits and alternatives.  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    Surgeon: Dr. Earle Gell  Asst.: Arnetha Massy NP  Anesthesia: Gen. endotracheal  Estimated blood loss: 100 cc  Drains: None  Complications: None  Description of procedure: The patient was brought to the operating room by the anesthesia team. General endotracheal anesthesia was induced. The patient was turned to the prone position on the Wilson frame. The patient's lumbosacral region was then prepared with Betadine scrub and Betadine solution. Sterile drapes were applied.  I then injected the area to be incised with Marcaine with epinephrine solution. I then used a scalpel to make a linear midline incision over the L3-4, L4-5 and L5-S1 intervertebral disc space. I then used electrocautery to perform a bilateral  subperiosteal dissection exposing the spinous process and lamina of L3, L4, L5 and the upper sacrum. We obtained intraoperative radiograph to confirm our location. I then inserted the [facility] retractor for exposure.  I used electrocautery to incise interspinous ligament at L3-4 and L4-5.  I used a Leksell rongeur to remove the L4 spinous process.  We then brought the operative microscope into the field. Under its magnification and  illumination we completed the microdissection. I used a high-speed drill to perform a laminotomy at L3-4, L4-5 and L5-S1 bilaterally. I then used a Kerrison punches to complete the L4 laminectomy and to widen the laminotomy and removed the ligamentum flavum at L3-4, L4-5 and L5-S1 bilaterally. We then used microdissection to free up the thecal sac and the bilateral L4, L5 and S1 nerve root from the epidural tissue. I then used a Kerrison punch to perform a foraminotomy at about the bilateral L4, L5 and S1 nerve root.   We inspected the intervertebral discs bilaterally at L3-4, L4-5 and L5-S1.  There were no significant herniations.  I then palpated along the ventral surface of the thecal sac and along exit route of the bilateral L4, L5 and S1 nerve roots and noted that the neural structures were well decompressed. This completed the decompression.  We then obtained hemostasis using bipolar electrocautery. We irrigated the wound out with bacitracin solution. We then removed the retractor. We then reapproximated the patient's thoracolumbar fascia with interrupted #1 Vicryl suture. We then reapproximated the patient's subcutaneous tissue with interrupted 2-0 Vicryl suture. We then reapproximated patient's skin with Steri-Strips and benzoin. The was then coated with bacitracin ointment. The drapes were removed. The patient was subsequently returned to the supine position where they were extubated by the anesthesia team. The patient was then transported to the postanesthesia care unit in stable condition. All sponge instrument and needle counts were reportedly correct at the end of this case.

## 2020-11-13 ENCOUNTER — Encounter (HOSPITAL_COMMUNITY): Payer: Self-pay | Admitting: Neurosurgery

## 2020-11-17 ENCOUNTER — Emergency Department (HOSPITAL_COMMUNITY): Payer: Medicare Other

## 2020-11-17 ENCOUNTER — Encounter (HOSPITAL_COMMUNITY): Payer: Self-pay | Admitting: Family Medicine

## 2020-11-17 ENCOUNTER — Inpatient Hospital Stay (HOSPITAL_COMMUNITY)
Admission: EM | Admit: 2020-11-17 | Discharge: 2020-11-21 | DRG: 438 | Disposition: A | Discharge status: Home / Self Care | Payer: Medicare Other | Attending: Internal Medicine | Admitting: Internal Medicine

## 2020-11-17 ENCOUNTER — Inpatient Hospital Stay (HOSPITAL_COMMUNITY): Payer: Medicare Other

## 2020-11-17 ENCOUNTER — Other Ambulatory Visit: Payer: Self-pay

## 2020-11-17 DIAGNOSIS — Z823 Family history of stroke: Secondary | ICD-10-CM

## 2020-11-17 DIAGNOSIS — K859 Acute pancreatitis without necrosis or infection, unspecified: Secondary | ICD-10-CM | POA: Diagnosis not present

## 2020-11-17 DIAGNOSIS — K5900 Constipation, unspecified: Secondary | ICD-10-CM | POA: Diagnosis not present

## 2020-11-17 DIAGNOSIS — Z8719 Personal history of other diseases of the digestive system: Secondary | ICD-10-CM | POA: Diagnosis not present

## 2020-11-17 DIAGNOSIS — Z20822 Contact with and (suspected) exposure to covid-19: Secondary | ICD-10-CM | POA: Diagnosis present

## 2020-11-17 DIAGNOSIS — Z7982 Long term (current) use of aspirin: Secondary | ICD-10-CM | POA: Diagnosis not present

## 2020-11-17 DIAGNOSIS — E7800 Pure hypercholesterolemia, unspecified: Secondary | ICD-10-CM | POA: Diagnosis present

## 2020-11-17 DIAGNOSIS — J9601 Acute respiratory failure with hypoxia: Secondary | ICD-10-CM | POA: Diagnosis not present

## 2020-11-17 DIAGNOSIS — Z9889 Other specified postprocedural states: Secondary | ICD-10-CM | POA: Diagnosis not present

## 2020-11-17 DIAGNOSIS — J441 Chronic obstructive pulmonary disease with (acute) exacerbation: Secondary | ICD-10-CM | POA: Diagnosis not present

## 2020-11-17 DIAGNOSIS — K858 Other acute pancreatitis without necrosis or infection: Principal | ICD-10-CM | POA: Diagnosis present

## 2020-11-17 DIAGNOSIS — Z888 Allergy status to other drugs, medicaments and biological substances status: Secondary | ICD-10-CM | POA: Diagnosis not present

## 2020-11-17 DIAGNOSIS — Z743 Need for continuous supervision: Secondary | ICD-10-CM | POA: Diagnosis not present

## 2020-11-17 DIAGNOSIS — F32A Depression, unspecified: Secondary | ICD-10-CM | POA: Diagnosis present

## 2020-11-17 DIAGNOSIS — J449 Chronic obstructive pulmonary disease, unspecified: Secondary | ICD-10-CM | POA: Diagnosis present

## 2020-11-17 DIAGNOSIS — K219 Gastro-esophageal reflux disease without esophagitis: Secondary | ICD-10-CM | POA: Diagnosis present

## 2020-11-17 DIAGNOSIS — K8500 Idiopathic acute pancreatitis without necrosis or infection: Secondary | ICD-10-CM | POA: Diagnosis not present

## 2020-11-17 DIAGNOSIS — E783 Hyperchylomicronemia: Secondary | ICD-10-CM | POA: Diagnosis not present

## 2020-11-17 DIAGNOSIS — I10 Essential (primary) hypertension: Secondary | ICD-10-CM | POA: Diagnosis present

## 2020-11-17 DIAGNOSIS — F17200 Nicotine dependence, unspecified, uncomplicated: Secondary | ICD-10-CM | POA: Diagnosis not present

## 2020-11-17 DIAGNOSIS — K8689 Other specified diseases of pancreas: Secondary | ICD-10-CM | POA: Diagnosis not present

## 2020-11-17 DIAGNOSIS — Z8249 Family history of ischemic heart disease and other diseases of the circulatory system: Secondary | ICD-10-CM | POA: Diagnosis not present

## 2020-11-17 DIAGNOSIS — Q453 Other congenital malformations of pancreas and pancreatic duct: Secondary | ICD-10-CM

## 2020-11-17 DIAGNOSIS — R1084 Generalized abdominal pain: Secondary | ICD-10-CM | POA: Diagnosis not present

## 2020-11-17 DIAGNOSIS — F17210 Nicotine dependence, cigarettes, uncomplicated: Secondary | ICD-10-CM | POA: Diagnosis present

## 2020-11-17 DIAGNOSIS — R110 Nausea: Secondary | ICD-10-CM | POA: Diagnosis not present

## 2020-11-17 DIAGNOSIS — Z79899 Other long term (current) drug therapy: Secondary | ICD-10-CM | POA: Diagnosis not present

## 2020-11-17 DIAGNOSIS — Z87442 Personal history of urinary calculi: Secondary | ICD-10-CM

## 2020-11-17 DIAGNOSIS — M48062 Spinal stenosis, lumbar region with neurogenic claudication: Secondary | ICD-10-CM | POA: Diagnosis present

## 2020-11-17 DIAGNOSIS — Z7951 Long term (current) use of inhaled steroids: Secondary | ICD-10-CM | POA: Diagnosis not present

## 2020-11-17 DIAGNOSIS — G6289 Other specified polyneuropathies: Secondary | ICD-10-CM | POA: Diagnosis present

## 2020-11-17 DIAGNOSIS — J90 Pleural effusion, not elsewhere classified: Secondary | ICD-10-CM | POA: Diagnosis not present

## 2020-11-17 DIAGNOSIS — R0902 Hypoxemia: Secondary | ICD-10-CM | POA: Diagnosis not present

## 2020-11-17 DIAGNOSIS — R1111 Vomiting without nausea: Secondary | ICD-10-CM | POA: Diagnosis not present

## 2020-11-17 DIAGNOSIS — I714 Abdominal aortic aneurysm, without rupture: Secondary | ICD-10-CM | POA: Diagnosis not present

## 2020-11-17 DIAGNOSIS — K828 Other specified diseases of gallbladder: Secondary | ICD-10-CM | POA: Diagnosis not present

## 2020-11-17 DIAGNOSIS — K8050 Calculus of bile duct without cholangitis or cholecystitis without obstruction: Secondary | ICD-10-CM | POA: Diagnosis not present

## 2020-11-17 DIAGNOSIS — J410 Simple chronic bronchitis: Secondary | ICD-10-CM | POA: Diagnosis not present

## 2020-11-17 DIAGNOSIS — J9690 Respiratory failure, unspecified, unspecified whether with hypoxia or hypercapnia: Secondary | ICD-10-CM | POA: Diagnosis not present

## 2020-11-17 DIAGNOSIS — K8020 Calculus of gallbladder without cholecystitis without obstruction: Secondary | ICD-10-CM | POA: Diagnosis not present

## 2020-11-17 LAB — CBC WITH DIFFERENTIAL/PLATELET
Abs Immature Granulocytes: 0.03 10*3/uL (ref 0.00–0.07)
Basophils Absolute: 0 10*3/uL (ref 0.0–0.1)
Basophils Relative: 0 %
Eosinophils Absolute: 0.1 10*3/uL (ref 0.0–0.5)
Eosinophils Relative: 1 %
HCT: 39.6 % (ref 36.0–46.0)
Hemoglobin: 13.1 g/dL (ref 12.0–15.0)
Immature Granulocytes: 0 %
Lymphocytes Relative: 14 %
Lymphs Abs: 1.4 10*3/uL (ref 0.7–4.0)
MCH: 31.3 pg (ref 26.0–34.0)
MCHC: 33.1 g/dL (ref 30.0–36.0)
MCV: 94.7 fL (ref 80.0–100.0)
Monocytes Absolute: 0.5 10*3/uL (ref 0.1–1.0)
Monocytes Relative: 5 %
Neutro Abs: 7.9 10*3/uL — ABNORMAL HIGH (ref 1.7–7.7)
Neutrophils Relative %: 80 %
Platelets: 247 10*3/uL (ref 150–400)
RBC: 4.18 MIL/uL (ref 3.87–5.11)
RDW: 12.4 % (ref 11.5–15.5)
WBC: 9.9 10*3/uL (ref 4.0–10.5)
nRBC: 0 % (ref 0.0–0.2)

## 2020-11-17 LAB — I-STAT CHEM 8, ED
BUN: 16 mg/dL (ref 8–23)
Calcium, Ion: 1.24 mmol/L (ref 1.15–1.40)
Chloride: 97 mmol/L — ABNORMAL LOW (ref 98–111)
Creatinine, Ser: 0.8 mg/dL (ref 0.44–1.00)
Glucose, Bld: 144 mg/dL — ABNORMAL HIGH (ref 70–99)
HCT: 39 % (ref 36.0–46.0)
Hemoglobin: 13.3 g/dL (ref 12.0–15.0)
Potassium: 3.5 mmol/L (ref 3.5–5.1)
Sodium: 136 mmol/L (ref 135–145)
TCO2: 29 mmol/L (ref 22–32)

## 2020-11-17 LAB — CBC
HCT: 32.1 % — ABNORMAL LOW (ref 36.0–46.0)
Hemoglobin: 10.8 g/dL — ABNORMAL LOW (ref 12.0–15.0)
MCH: 32.1 pg (ref 26.0–34.0)
MCHC: 33.6 g/dL (ref 30.0–36.0)
MCV: 95.5 fL (ref 80.0–100.0)
Platelets: 191 10*3/uL (ref 150–400)
RBC: 3.36 MIL/uL — ABNORMAL LOW (ref 3.87–5.11)
RDW: 12.5 % (ref 11.5–15.5)
WBC: 7.4 10*3/uL (ref 4.0–10.5)
nRBC: 0 % (ref 0.0–0.2)

## 2020-11-17 LAB — CREATININE, SERUM
Creatinine, Ser: 0.7 mg/dL (ref 0.44–1.00)
GFR, Estimated: >60 mL/min (ref >60)

## 2020-11-17 LAB — URINALYSIS, ROUTINE W REFLEX MICROSCOPIC
Bilirubin Urine: NEGATIVE
Glucose, UA: NEGATIVE mg/dL
Hgb urine dipstick: NEGATIVE
Ketones, ur: NEGATIVE mg/dL
Leukocytes,Ua: NEGATIVE
Nitrite: NEGATIVE
Protein, ur: NEGATIVE mg/dL
Specific Gravity, Urine: 1.027 (ref 1.005–1.030)
pH: 7 (ref 5.0–8.0)

## 2020-11-17 LAB — LIPASE, BLOOD: Lipase: 8117 U/L — ABNORMAL HIGH (ref 11–51)

## 2020-11-17 LAB — COMPREHENSIVE METABOLIC PANEL
ALT: 14 U/L (ref 0–44)
AST: 25 U/L (ref 15–41)
Albumin: 3.7 g/dL (ref 3.5–5.0)
Alkaline Phosphatase: 58 U/L (ref 38–126)
Anion gap: 13 (ref 5–15)
BUN: 14 mg/dL (ref 8–23)
CO2: 27 mmol/L (ref 22–32)
Calcium: 9.4 mg/dL (ref 8.9–10.3)
Chloride: 96 mmol/L — ABNORMAL LOW (ref 98–111)
Creatinine, Ser: 0.87 mg/dL (ref 0.44–1.00)
GFR, Estimated: >60 mL/min (ref >60)
Glucose, Bld: 148 mg/dL — ABNORMAL HIGH (ref 70–99)
Potassium: 3.4 mmol/L — ABNORMAL LOW (ref 3.5–5.1)
Sodium: 136 mmol/L (ref 135–145)
Total Bilirubin: 0.8 mg/dL (ref 0.3–1.2)
Total Protein: 6.8 g/dL (ref 6.5–8.1)

## 2020-11-17 LAB — TRIGLYCERIDES: Triglycerides: 104 mg/dL (ref <150)

## 2020-11-17 LAB — LACTIC ACID, PLASMA: Lactic Acid, Venous: 1.2 mmol/L (ref 0.5–1.9)

## 2020-11-17 LAB — SARS CORONAVIRUS 2 (TAT 6-24 HRS): SARS Coronavirus 2: NEGATIVE

## 2020-11-17 MED ORDER — FENTANYL CITRATE (PF) 100 MCG/2ML IJ SOLN
50.0000 ug | Freq: Once | INTRAMUSCULAR | Status: AC
  Administered 2020-11-17: 50 ug via INTRAVENOUS
  Filled 2020-11-17: qty 2

## 2020-11-17 MED ORDER — CLONIDINE HCL 0.1 MG PO TABS
0.1000 mg | ORAL_TABLET | Freq: Every day | ORAL | Status: DC
  Administered 2020-11-17 – 2020-11-20 (×4): 0.1 mg via ORAL
  Filled 2020-11-17 (×4): qty 1

## 2020-11-17 MED ORDER — HYDROMORPHONE HCL 1 MG/ML IJ SOLN
0.5000 mg | Freq: Once | INTRAMUSCULAR | Status: AC
  Administered 2020-11-17: 0.5 mg via INTRAVENOUS
  Filled 2020-11-17: qty 1

## 2020-11-17 MED ORDER — UMECLIDINIUM BROMIDE 62.5 MCG/INH IN AEPB
1.0000 | INHALATION_SPRAY | Freq: Every day | RESPIRATORY_TRACT | Status: DC | PRN
  Filled 2020-11-17: qty 7

## 2020-11-17 MED ORDER — MORPHINE SULFATE (PF) 2 MG/ML IV SOLN
1.0000 mg | Freq: Once | INTRAVENOUS | Status: AC
  Administered 2020-11-17: 1 mg via INTRAVENOUS
  Filled 2020-11-17: qty 1

## 2020-11-17 MED ORDER — SODIUM CHLORIDE 0.9 % IV BOLUS
1000.0000 mL | Freq: Once | INTRAVENOUS | Status: AC
  Administered 2020-11-17: 1000 mL via INTRAVENOUS

## 2020-11-17 MED ORDER — METOCLOPRAMIDE HCL 5 MG/ML IJ SOLN
5.0000 mg | Freq: Once | INTRAMUSCULAR | Status: AC
  Administered 2020-11-17: 5 mg via INTRAVENOUS
  Filled 2020-11-17: qty 2

## 2020-11-17 MED ORDER — POTASSIUM CHLORIDE 10 MEQ/100ML IV SOLN
10.0000 meq | Freq: Once | INTRAVENOUS | Status: AC
  Administered 2020-11-17: 10 meq via INTRAVENOUS
  Filled 2020-11-17: qty 100

## 2020-11-17 MED ORDER — HYDROCHLOROTHIAZIDE 12.5 MG PO CAPS
12.5000 mg | ORAL_CAPSULE | Freq: Every day | ORAL | Status: DC
  Administered 2020-11-17: 12.5 mg via ORAL
  Filled 2020-11-17 (×2): qty 1

## 2020-11-17 MED ORDER — SODIUM CHLORIDE 0.9 % IV SOLN
1000.0000 mL | INTRAVENOUS | Status: DC
  Administered 2020-11-17 – 2020-11-19 (×6): 1000 mL via INTRAVENOUS

## 2020-11-17 MED ORDER — ONDANSETRON HCL 4 MG/2ML IJ SOLN
4.0000 mg | Freq: Four times a day (QID) | INTRAMUSCULAR | Status: DC | PRN
  Administered 2020-11-17: 4 mg via INTRAVENOUS
  Filled 2020-11-17: qty 2

## 2020-11-17 MED ORDER — ASPIRIN EC 81 MG PO TBEC
81.0000 mg | DELAYED_RELEASE_TABLET | Freq: Every evening | ORAL | Status: DC
  Administered 2020-11-17 – 2020-11-20 (×4): 81 mg via ORAL
  Filled 2020-11-17 (×4): qty 1

## 2020-11-17 MED ORDER — SIMVASTATIN 20 MG PO TABS
40.0000 mg | ORAL_TABLET | Freq: Every evening | ORAL | Status: DC
  Administered 2020-11-17 – 2020-11-20 (×4): 40 mg via ORAL
  Filled 2020-11-17 (×4): qty 2

## 2020-11-17 MED ORDER — DOCUSATE SODIUM 100 MG PO CAPS
100.0000 mg | ORAL_CAPSULE | Freq: Two times a day (BID) | ORAL | Status: DC
  Administered 2020-11-17 – 2020-11-21 (×8): 100 mg via ORAL
  Filled 2020-11-17 (×9): qty 1

## 2020-11-17 MED ORDER — HYDROCODONE-ACETAMINOPHEN 5-325 MG PO TABS
1.0000 | ORAL_TABLET | ORAL | Status: DC | PRN
  Administered 2020-11-17 – 2020-11-19 (×6): 1 via ORAL
  Filled 2020-11-17 (×7): qty 1

## 2020-11-17 MED ORDER — ONDANSETRON HCL 4 MG/2ML IJ SOLN
4.0000 mg | Freq: Once | INTRAMUSCULAR | Status: AC
  Administered 2020-11-17: 4 mg via INTRAVENOUS
  Filled 2020-11-17: qty 2

## 2020-11-17 MED ORDER — PANTOPRAZOLE SODIUM 40 MG IV SOLR
40.0000 mg | INTRAVENOUS | Status: DC
  Administered 2020-11-17 – 2020-11-19 (×3): 40 mg via INTRAVENOUS
  Filled 2020-11-17 (×3): qty 40

## 2020-11-17 MED ORDER — SODIUM CHLORIDE 0.9 % IV BOLUS (SEPSIS)
1000.0000 mL | Freq: Once | INTRAVENOUS | Status: AC
  Administered 2020-11-17: 1000 mL via INTRAVENOUS

## 2020-11-17 MED ORDER — ENOXAPARIN SODIUM 40 MG/0.4ML ~~LOC~~ SOLN
40.0000 mg | SUBCUTANEOUS | Status: DC
  Administered 2020-11-17 – 2020-11-20 (×4): 40 mg via SUBCUTANEOUS
  Filled 2020-11-17 (×4): qty 0.4

## 2020-11-17 MED ORDER — GABAPENTIN 100 MG PO CAPS
200.0000 mg | ORAL_CAPSULE | Freq: Every day | ORAL | Status: DC
  Administered 2020-11-17 – 2020-11-20 (×4): 200 mg via ORAL
  Filled 2020-11-17 (×4): qty 2

## 2020-11-17 MED ORDER — NICOTINE 14 MG/24HR TD PT24
14.0000 mg | MEDICATED_PATCH | Freq: Every day | TRANSDERMAL | Status: DC
  Administered 2020-11-19 – 2020-11-21 (×3): 14 mg via TRANSDERMAL
  Filled 2020-11-17 (×5): qty 1

## 2020-11-17 MED ORDER — HYDROMORPHONE HCL 1 MG/ML IJ SOLN
0.5000 mg | INTRAMUSCULAR | Status: DC | PRN
Start: 2020-11-17 — End: 2020-11-21
  Administered 2020-11-17 – 2020-11-18 (×2): 0.5 mg via INTRAVENOUS
  Filled 2020-11-17 (×2): qty 1

## 2020-11-17 MED ORDER — IRBESARTAN 150 MG PO TABS
150.0000 mg | ORAL_TABLET | Freq: Every day | ORAL | Status: DC
  Administered 2020-11-17 – 2020-11-21 (×4): 150 mg via ORAL
  Filled 2020-11-17 (×5): qty 1

## 2020-11-17 MED ORDER — METOPROLOL SUCCINATE ER 25 MG PO TB24
25.0000 mg | ORAL_TABLET | Freq: Every evening | ORAL | Status: DC
  Administered 2020-11-17 – 2020-11-20 (×4): 25 mg via ORAL
  Filled 2020-11-17 (×4): qty 1

## 2020-11-17 MED ORDER — IRBESARTAN-HYDROCHLOROTHIAZIDE 150-12.5 MG PO TABS: 1.0000 | ORAL_TABLET | Freq: Every day | ORAL | Status: DC

## 2020-11-17 MED ORDER — LACTATED RINGERS IV SOLN: INTRAVENOUS | Status: DC

## 2020-11-17 MED ORDER — IOHEXOL 9 MG/ML PO SOLN
ORAL | Status: AC
  Filled 2020-11-17: qty 1000

## 2020-11-17 MED ORDER — AMLODIPINE BESYLATE 5 MG PO TABS
5.0000 mg | ORAL_TABLET | Freq: Every day | ORAL | Status: DC
  Administered 2020-11-17 – 2020-11-21 (×4): 5 mg via ORAL
  Filled 2020-11-17 (×5): qty 1

## 2020-11-17 MED ORDER — IOHEXOL 300 MG/ML  SOLN
80.0000 mL | Freq: Once | INTRAMUSCULAR | Status: AC | PRN
  Administered 2020-11-17: 80 mL via INTRAVENOUS

## 2020-11-17 NOTE — ED Provider Notes (Signed)
[facility] EMERGENCY DEPARTMENT Provider Note  CSN: [id number] Arrival date & time: 11/17/20 0106  Chief Complaint(s) Back Pain and Abdominal Pain  HPI Elizabeth [name] is a 80 y.o. female with a past medical history listed below including recent laminectomy to the lumbar region 5 days ago. Here for gradual onset of left-sided abdominal pain described as burning. Pain worse with movement and palpation as well as emesis. No alleviating factors. Associated with nausea and nonbloody nonbilious emesis at home. Patient also reports constipation since the surgery only having a small bowel movement yesterday. No reported fevers or chills. No chest pain or shortness of breath. No dysuria or hematuria.  Patient was prescribed Percocet for postop pain but reports that she has not been taking it often as she does not like the way it makes her feel.  Patient is taking Colace for constipation.  HPI  Past Medical History Past Medical History:  Diagnosis Date  . Abdominal cramping    possibly irritable bowel syndrome versus adhesions secondary to multiple surgeries and abdominal inflammation and 2009 secondary to diverticulitis and diverticular phlegmon  . Allergic rhinitis   . Arthritis   . Carotid artery disease (HCC)    0000000 RICA, < [id number] LICA [id number] [name] ([facility])  . Cataract   . Chest pain syndrome 2004   with adenosine Cardiolite that was normal  . Chronic hoarseness   . COPD (chronic obstructive pulmonary disease) ([place])   . GERD (gastroesophageal reflux disease)   . Heart murmur    Mild AS, AI. AV pk grad 18.79 mmHg, mn grad 11.20 mmHg, AVA (VTI) 1.05 cm2 10/10/20, 3 year f/u rec ([facility])  . History of kidney stones   . History of renal calculi   . Hypercholesterolemia   . Hypertension   . Incontinence    Cough incontinence  . Mild depression ([place])   . Neuromuscular disorder (HCC)    neuropathy bilateral lower extremities  . Osteopenia   . Peripheral vascular  disease ([place])    with femoral and carotid bruits  . PONV (postoperative nausea and vomiting)   . Tobacco dependence   . Vitamin D deficiency    Patient Active Problem List   Diagnosis Date Noted  . Acute pancreatitis 11/17/2020  . 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   Home Medication(s) Prior to Admission medications   Medication Sig Start Date End Date Taking? Authorizing Provider  amLODipine (NORVASC) 5 MG tablet Take 5 mg by mouth daily. 02/14/20   [provider]  aspirin 81 MG tablet Take 81 mg by mouth every evening.     [provider]  cloNIDine (CATAPRES) 0.1 MG tablet Take 0.1 mg by mouth at bedtime.     [provider]  Coenzyme Q10 100 MG capsule Take 100 mg by mouth daily.    [provider]  docusate sodium (COLACE) 100 MG capsule Take 1 capsule (100 mg total) by mouth 2 (two) times daily. 11/12/20   Newman Pies, MD  famotidine (PEPCID) 20 MG tablet Take 20 mg by mouth every other day.     [provider]  gabapentin (NEURONTIN) 100 MG capsule Take 2 capsules (200 mg total) by mouth at bedtime. PATIENT NEEDS OFFICE VISIT FOR ADDITIONAL REFILLS 07/30/15   [name], MD  HYDROcodone-acetaminophen (NORCO/VICODIN) 5-325 MG tablet Take 1 tablet by mouth every 4 (four) hours as needed for moderate pain. 11/12/20   Newman Pies, MD  ibuprofen (ADVIL,MOTRIN) 200  MG tablet Take 400 mg by mouth every 6 (six) hours as needed for moderate pain.     [provider]  INCRUSE ELLIPTA 62.5 MCG/INH AEPB Inhale 1 puff into the lungs daily as needed (asthma). 03/19/20   [provider]  irbesartan-hydrochlorothiazide (AVALIDE) 150-12.5 MG per tablet Take 1 tablet by mouth daily.    [provider]  metoprolol succinate (TOPROL-XL) 25 MG 24 hr tablet Take 25 mg by mouth every evening.     [provider]  Multiple Vitamin (MULTIVITAMIN) tablet Take 1 tablet by mouth  daily.    [provider]  omeprazole (PRILOSEC) 20 MG capsule Take 1 capsule (20 mg total) by mouth daily. 04/11/20 05/11/20  Curatolo, Adam, DO  simvastatin (ZOCOR) 40 MG tablet Take 40 mg by mouth every evening.     [provider]  sucralfate (CARAFATE) 1 g tablet Take 1 tablet (1 g total) by mouth 4 (four) times daily -  with meals and at bedtime for 14 days. 04/11/20 04/25/20  Curatolo, Adam, DO  Turmeric 500 MG CAPS Take 500 mg by mouth daily.    [provider]  vitamin B-12 (CYANOCOBALAMIN) 100 MCG tablet Take 100 mcg by mouth daily.    [provider]  vitamin E 180 MG (400 UNITS) capsule Take 400 Units by mouth daily.    [provider]                                                                                                                                    Past Surgical History Past Surgical History:  Procedure Laterality Date  . APPENDECTOMY    . BREAST BIOPSY    . CATARACT EXTRACTION, BILATERAL    . COLON SURGERY     2009  . COSMETIC SURGERY     on neck and eyes  . DILATION AND CURETTAGE OF UTERUS    . EYE SURGERY     2014  . FEMUR SURGERY  12/2007   left femur surgery--for femoral neck fracture status post fall   . FRACTURE SURGERY     broke left leg and left arm- 2012  . LIPOSUCTION    . LUMBAR LAMINECTOMY/DECOMPRESSION MICRODISCECTOMY N/A 11/12/2020   Procedure: LAMINECTOMY AND FORAMINOTOMY Lumbar three four, Lumbar four five, Lumbar Five Sacral one;  Surgeon: Newman Pies, MD;  Location: [facility];  Service: Neurosurgery;  Laterality: N/A;  . OTHER SURGICAL HISTORY     sigmoid colectomy  . SHOULDER SURGERY     arthroscopic shoulder surgery   . TONSILLECTOMY    . TUBAL LIGATION     Family History Family History  Problem Relation Age of Onset  . Heart disease Mother   . Stroke Mother     Social History Social History   Tobacco Use  . Smoking status: Current Every Day Smoker    Packs/day: 0.50    Years: 60.00  Pack years: 30.00    Types: Cigarettes  . Smokeless tobacco: Never Used  Vaping Use  . Vaping Use: Never used  Substance Use Topics  . Alcohol use: Yes    Alcohol/week: 0.0 standard drinks    Comment: Occ glass of wine  . Drug use: No   Allergies Other, Pollen extract, Tegretol [carbamazepine], Lisinopril, and Tetracyclines & related  Review of Systems Review of Systems All other systems are reviewed and are negative for acute change except as noted in the HPI  Physical Exam Vital Signs  I have reviewed the triage vital signs BP (!) 151/67   Pulse 90   Temp 98.6 F (37 C) (Oral)   Resp 19   SpO2 90%   Physical Exam Vitals reviewed.  Constitutional:      General: She is not in acute distress.    Appearance: She is well-developed and well-nourished. She is not diaphoretic.  HENT:     Head: Normocephalic and atraumatic.     Right Ear: External ear normal.     Left Ear: External ear normal.     Nose: Nose normal.  Eyes:     General: No scleral icterus.    Extraocular Movements: EOM normal.     Conjunctiva/sclera: Conjunctivae normal.  Neck:     Trachea: Phonation normal.  Cardiovascular:     Rate and Rhythm: Normal rate and regular rhythm.  Pulmonary:     Effort: Pulmonary effort is normal. No respiratory distress.     Breath sounds: No stridor.  Abdominal:     General: There is no distension.     Tenderness: There is abdominal tenderness in the periumbilical area, suprapubic area, left upper quadrant and left lower quadrant. There is no guarding or rebound.    Musculoskeletal:        General: No edema. Normal range of motion.     Cervical back: Normal range of motion.       Back:  Neurological:     Mental Status: She is alert and oriented to person, place, and time.  Psychiatric:        Mood and Affect: Mood and affect normal.        Behavior: Behavior normal.     ED Results and Treatments Labs (all labs ordered are listed, but only abnormal  results are displayed) Labs Reviewed  COMPREHENSIVE METABOLIC PANEL - Abnormal; Notable for the following components:      Result Value   Potassium 3.4 (*)    Chloride 96 (*)    Glucose, Bld 148 (*)    All other components within normal limits  LIPASE, BLOOD - Abnormal; Notable for the following components:   Lipase 8,117 (*)    All other components within normal limits  CBC WITH DIFFERENTIAL/PLATELET - Abnormal; Notable for the following components:   Neutro Abs 7.9 (*)    All other components within normal limits  I-STAT CHEM 8, ED - Abnormal; Notable for the following components:   Chloride 97 (*)    Glucose, Bld 144 (*)    All other components within normal limits  SARS CORONAVIRUS 2 (TAT 6-24 HRS)  LACTIC ACID, PLASMA  TRIGLYCERIDES  URINALYSIS, ROUTINE W REFLEX MICROSCOPIC  EKG  EKG Interpretation  Date/Time:    Ventricular Rate:    PR Interval:    QRS Duration:   QT Interval:    QTC Calculation:   R Axis:     Text Interpretation:        Radiology CT ABDOMEN PELVIS W CONTRAST  Result Date: 11/17/2020 CLINICAL DATA:  21-year-old female with back pain following spine surgery five days ago. Constipation. EXAM: CT ABDOMEN AND PELVIS WITH CONTRAST TECHNIQUE: Multidetector CT imaging of the abdomen and pelvis was performed using the standard protocol following bolus administration of intravenous contrast. CONTRAST:  11mL OMNIPAQUE IOHEXOL 300 MG/ML  SOLN COMPARISON:  CT Abdomen and Pelvis 04/11/2020. FINDINGS: Lower chest: Chronic bulky soft plaque or thrombus in the descending thoracic aorta is stable since June (series 3, image 4). Calcified aortic atherosclerosis. No cardiomegaly or pericardial effusion. Both lung bases are positive for dependent ground-glass and confluent opacity slightly greater on the right. Favor atelectasis. No pleural effusion.  Hepatobiliary: Small volume perihepatic free fluid and new intrahepatic biliary ductal dilatation and periportal edema. Liver parenchymal enhancement is stable and within normal limits. Gallbladder remains within normal limits. Pancreas: Heterogeneously enhancing with confluent surrounding soft tissue edema and/or small volume free fluid. Dilated main pancreatic duct is new since June (series 3, image 25) but there is no obstructing mass or etiology identified. The duct tapers in the distal pancreas. No pancreatic necrosis identified. Spleen: Negative aside from adjacent soft tissue edema or trace free fluid. Adrenals/Urinary Tract: Adrenal glands remain within normal limits. Bilateral kidneys are stable. Renal enhancement and contrast excretion is symmetric and within normal limits. Stomach/Bowel: Oral contrast administered and has reached the distal small bowel but not yet the colon. The stomach is distended. But otherwise there is no abnormally dilated large or small bowel. Retained stool throughout the colon with intermittent diverticulosis (most pronounced in the right colon). No convincing small or large bowel inflammation. No free air. Vascular/Lymphatic: Severe Aortoiliac calcified atherosclerosis. Major arterial structures in the abdomen and pelvis remain patent. 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 There is also chronic severe stenosis at the aortoiliac bifurcation, but the major arterial structures remain patent. Portal venous system is patent.  Splenic vein remains patent. No lymphadenopathy. Reproductive: Stable since June, within normal limits aside from parametrial venous enlargement (series 3, image 66). Other: No pelvic free fluid. Musculoskeletal: Postoperative changes to the posterior elements at L3-L4 and L4-L5. Small volume residual postoperative soft tissue gas along the left erector  spinae muscles. No unexpected osseous changes identified. Chronic left femur ORIF. IMPRESSION: 1. Positive for Acute Pancreatitis, with pancreatic and biliary ductal enlargement. No etiology of ductal obstruction is identified by CT. 2. Associated peripancreatic and periportal edema with small volume free fluid in the abdomen and pelvis. No pancreatic necrosis, organized or drainable fluid collection. 3. 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 But no major arterial occlusion is identified. - small but developing infrarenal abdominal aortic aneurysm with mural plaque or thrombus, 23 mm diameter and stable since June. - additional chronic bulky soft plaque or thrombus in the descending thoracic aorta, stable since June. 4. Recent posterior decompression of the lumbar spine with no adverse features. Electronically Signed   By: Genevie Ann M.D.   On: 11/17/2020 04:52   US Abdomen Limited RUQ (LIVER/GB)  Result Date: 11/17/2020 CLINICAL DATA:  Pancreatitis EXAM:  ULTRASOUND ABDOMEN LIMITED RIGHT UPPER QUADRANT COMPARISON:  CT from same day FINDINGS: Gallbladder: No gallstones or wall thickening visualized. No sonographic Murphy sign noted by sonographer. Common bile duct: Diameter: 7.3 cm Liver: No focal lesion identified. Within normal limits in parenchymal echogenicity. Portal vein is patent on color Doppler imaging with normal direction of blood flow towards the liver. Other: There is a trace amount of free fluid in the upper abdomen. The partially visualized pancreatic duct appears to be dilated. IMPRESSION: 1. No evidence for cholelithiasis. 2. Dilatation of the pancreatic duct and common bile duct raises concern for an obstructing process such as choledocholithiasis. Follow-up with MRCP/ERCP is recommended. 3. Trace free fluid in the upper abdomen. Electronically Signed   By: Constance Holster M.D.   On: 11/17/2020 06:57     Pertinent labs & imaging results that were available during my care of the patient were reviewed by me and considered in my medical decision making (see chart for details).  Medications Ordered in ED Medications  sodium chloride 0.9 % bolus 1,000 mL (0 mLs Intravenous Stopped 11/17/20 0650)    Followed by  sodium chloride 0.9 % bolus 1,000 mL (1,000 mLs Intravenous New Bag/Given 11/17/20 0701)    Followed by  0.9 %  sodium chloride infusion (has no administration in time range)  lactated ringers infusion (has no administration in time range)  sodium chloride 0.9 % bolus 1,000 mL (0 mLs Intravenous Stopped 11/17/20 0249)  ondansetron (ZOFRAN) injection 4 mg (4 mg Intravenous Given 11/17/20 0138)  fentaNYL (SUBLIMAZE) injection 50 mcg (50 mcg Intravenous Given 11/17/20 0136)  iohexol (OMNIPAQUE) 9 MG/ML oral solution (  Contrast Given 11/17/20 0144)  HYDROmorphone (DILAUDID) injection 0.5 mg (0.5 mg Intravenous Given 11/17/20 0302)  metoCLOPramide (REGLAN) injection 5 mg (5 mg Intravenous Given 11/17/20 0302)  iohexol (OMNIPAQUE) 300 MG/ML solution 80 mL (80 mLs Intravenous Contrast Given 11/17/20 0413)  HYDROmorphone (DILAUDID) injection 0.5 mg (0.5 mg Intravenous Given 11/17/20 0522)                                                                                                                                    Procedures Procedures  (including critical care time)  Medical Decision Making / ED Course I have reviewed the nursing notes for this encounter and the patient's prior records (if available in EHR or on provided paperwork).   Lashira Defaria Buccieri was evaluated in Emergency Department on 11/17/2020 for the symptoms described in the history of present illness. She was evaluated in the context of the global COVID-19 pandemic, which necessitated consideration that the patient might be at risk for infection with the SARS-CoV-2 virus that causes COVID-19. Institutional protocols and algorithms that  pertain to the evaluation of patients at risk for COVID-19 are in a state of rapid change based on information released by regulatory bodies including the CDC and federal and state organizations. These policies and algorithms were  followed during the patient's care in the ED.    Clinical Course as of 11/17/20 0734  Sat Nov 17, 2020  2694 Work-up notable for pancreatitis without evidence of necrosis on CT scan.  Patient does have intrahepatic ductal dilatation.  No LFT elevation concerning for biliary obstruction.  Right upper quadrant ultrasound placed.  Patient denies heavy alcohol use.  She was treated symptomatically with IV fluids and pain medicine.    Admitted to medicine for further work-up and management. [PC]    Clinical Course User Index [PC] Judieth Mckown, Grayce Sessions, MD     Final Clinical Impression(s) / ED Diagnoses Final diagnoses:  Pancreatitis      This chart was dictated using voice recognition software.  Despite best efforts to proofread,  errors can occur which can change the documentation meaning.   Fatima Blank, MD 11/17/20 [phone]

## 2020-11-17 NOTE — ED Notes (Signed)
First contact. Change of shift. Pt resting in bed. Reporting ABD pain.

## 2020-11-17 NOTE — ED Notes (Signed)
Patient transported to Ultrasound 

## 2020-11-17 NOTE — ED Notes (Signed)
Report to [place], Therapist, sports. Transition of care.

## 2020-11-17 NOTE — ED Notes (Signed)
Patient transported to CT 

## 2020-11-17 NOTE — ED Notes (Signed)
Pt states that fentanyl did not touch her pain

## 2020-11-17 NOTE — ED Notes (Signed)
Omnipaque oral solution bottles in pt room. Pt currently drinking first bottle.

## 2020-11-17 NOTE — H&P (Addendum)
History and Physical  Elizabeth [name] VOJ:[id number] DOB: 11/13/1940 DOA: 11/17/2020  Referring physician: Leonette Monarch, Grayce Sessions, MD PCP: [name], MD  Outpatient Specialists:  Patient coming from: Home & is able to ambulate yes  Chief Complaint: Acute abdominal pain nausea and vomiting  HPI: Elizabeth [name] is a 80 y.o. female with medical history significant for tobacco dependence, mild depression, renal calculi, COPD GERD and recent laminectomy discharge November 12, 2020 gradual onset of abdominal pain which she describes as located on the left side with burning worse with movement and associated with nausea and vomiting.    ED Course: In the emergency room  Review of Systems:  Pt complains of abdominal pain nausea vomiting marked constipation  Pt denies any chest pain no fever no chills or hematuria.  Review of systems are otherwise negative   Past Medical History:  Diagnosis Date  . Abdominal cramping    possibly irritable bowel syndrome versus adhesions secondary to multiple surgeries and abdominal inflammation and 2009 secondary to diverticulitis and diverticular phlegmon  . Allergic rhinitis   . Arthritis   . Carotid artery disease (HCC)    99-37% RICA, < 16% LICA 96/7/89 [name] ([facility])  . Cataract   . Chest pain syndrome 2004   with adenosine Cardiolite that was normal  . Chronic hoarseness   . COPD (chronic obstructive pulmonary disease) ([place])   . GERD (gastroesophageal reflux disease)   . Heart murmur    Mild AS, AI. AV pk grad 18.79 mmHg, mn grad 11.20 mmHg, AVA (VTI) 1.05 cm2 10/10/20, 3 year f/u rec ([facility])  . History of kidney stones   . History of renal calculi   . Hypercholesterolemia   . Hypertension   . Incontinence    Cough incontinence  . Mild depression ([place])   . Neuromuscular disorder (HCC)    neuropathy bilateral lower extremities  . Osteopenia   . Peripheral vascular disease ([place])    with femoral and carotid bruits  . PONV (postoperative  nausea and vomiting)   . Tobacco dependence   . Vitamin D deficiency    Past Surgical History:  Procedure Laterality Date  . APPENDECTOMY    . BREAST BIOPSY    . CATARACT EXTRACTION, BILATERAL    . COLON SURGERY     2009  . COSMETIC SURGERY     on neck and eyes  . DILATION AND CURETTAGE OF UTERUS    . EYE SURGERY     2014  . FEMUR SURGERY  12/2007   left femur surgery--for femoral neck fracture status post fall   . FRACTURE SURGERY     broke left leg and left arm- 2012  . LIPOSUCTION    . LUMBAR LAMINECTOMY/DECOMPRESSION MICRODISCECTOMY N/A 11/12/2020   Procedure: LAMINECTOMY AND FORAMINOTOMY Lumbar three four, Lumbar four five, Lumbar Five Sacral one;  Surgeon: Newman Pies, MD;  Location: [facility];  Service: Neurosurgery;  Laterality: N/A;  . OTHER SURGICAL HISTORY     sigmoid colectomy  . SHOULDER SURGERY     arthroscopic shoulder surgery   . TONSILLECTOMY    . TUBAL LIGATION      Social History:  reports that she has been smoking cigarettes. She has a 30.00 pack-year smoking history. She has never used smokeless tobacco. She reports current alcohol use. She reports that she does not use drugs.   Allergies  Allergen Reactions  . Other     Chlorexolone  . Pollen Extract     Allergic to  tree pollens  . Tegretol [Carbamazepine]     Unknown   . Lisinopril Rash  . Tetracyclines & Related Rash    Family History  Problem Relation Age of Onset  . Heart disease Mother   . Stroke Mother       Prior to Admission medications   Medication Sig Start Date End Date Taking? Authorizing Provider  amLODipine (NORVASC) 5 MG tablet Take 5 mg by mouth daily. 02/14/20   [provider]  aspirin 81 MG tablet Take 81 mg by mouth every evening.     [provider]  cloNIDine (CATAPRES) 0.1 MG tablet Take 0.1 mg by mouth at bedtime.     [provider]  Coenzyme Q10 100 MG capsule Take 100 mg by mouth daily.    [provider]  docusate sodium  (COLACE) 100 MG capsule Take 1 capsule (100 mg total) by mouth 2 (two) times daily. 11/12/20   Newman Pies, MD  famotidine (PEPCID) 20 MG tablet Take 20 mg by mouth every other day.     [provider]  gabapentin (NEURONTIN) 100 MG capsule Take 2 capsules (200 mg total) by mouth at bedtime. PATIENT NEEDS OFFICE VISIT FOR ADDITIONAL REFILLS 07/30/15   [name], MD  HYDROcodone-acetaminophen (NORCO/VICODIN) 5-325 MG tablet Take 1 tablet by mouth every 4 (four) hours as needed for moderate pain. 11/12/20   Newman Pies, MD  ibuprofen (ADVIL,MOTRIN) 200 MG tablet Take 400 mg by mouth every 6 (six) hours as needed for moderate pain.     [provider]  INCRUSE ELLIPTA 62.5 MCG/INH AEPB Inhale 1 puff into the lungs daily as needed (asthma). 03/19/20   [provider]  irbesartan-hydrochlorothiazide (AVALIDE) 150-12.5 MG per tablet Take 1 tablet by mouth daily.    [provider]  metoprolol succinate (TOPROL-XL) 25 MG 24 hr tablet Take 25 mg by mouth every evening.     [provider]  Multiple Vitamin (MULTIVITAMIN) tablet Take 1 tablet by mouth daily.    [provider]  omeprazole (PRILOSEC) 20 MG capsule Take 1 capsule (20 mg total) by mouth daily. 04/11/20 05/11/20  Curatolo, Adam, DO  simvastatin (ZOCOR) 40 MG tablet Take 40 mg by mouth every evening.     [provider]  sucralfate (CARAFATE) 1 g tablet Take 1 tablet (1 g total) by mouth 4 (four) times daily -  with meals and at bedtime for 14 days. 04/11/20 04/25/20  Curatolo, Adam, DO  Turmeric 500 MG CAPS Take 500 mg by mouth daily.    [provider]  vitamin B-12 (CYANOCOBALAMIN) 100 MCG tablet Take 100 mcg by mouth daily.    [provider]  vitamin E 180 MG (400 UNITS) capsule Take 400 Units by mouth daily.    [provider]    Physical Exam: BP 140/66   Pulse 82   Temp 98.1 F (36.7 C) (Oral)   Resp 13   SpO2 96%   Exam:  . General: 80  y.o. year-old female well developed well nourished in no acute distress.  Alert and oriented x3. . Cardiovascular: Regular rate and rhythm with no rubs or gallops.  No thyromegaly or JVD noted.   Marland Kitchen Respiratory: Clear to auscultation with no wheezes or rales. Good inspiratory effort. . Abdomen: Soft nontender nondistended with normal bowel sounds x4 quadrants. . Musculoskeletal: No lower extremity edema. 2/4 pulses in all 4 extremities. . Skin: No ulcerative lesions noted or rashes, . Psychiatry: Mood is appropriate  for condition and setting           Labs on Admission:  Basic Metabolic Panel: Recent Labs  Lab 11/17/20 0130 11/17/20 0143  NA 136 136  K 3.4* 3.5  CL 96* 97*  CO2 27  --   GLUCOSE 148* 144*  BUN 14 16  CREATININE 0.87 0.80  CALCIUM 9.4  --    Liver Function Tests: Recent Labs  Lab 11/17/20 0130  AST 25  ALT 14  ALKPHOS 58  BILITOT 0.8  PROT 6.8  ALBUMIN 3.7   Recent Labs  Lab 11/17/20 0130  LIPASE 8,117*   No results for input(s): AMMONIA in the last 168 hours. CBC: Recent Labs  Lab 11/17/20 0130 11/17/20 0143  WBC 9.9  --   NEUTROABS 7.9*  --   HGB 13.1 13.3  HCT 39.6 39.0  MCV 94.7  --   PLT 247  --    Cardiac Enzymes: No results for input(s): CKTOTAL, CKMB, CKMBINDEX, TROPONINI in the last 168 hours.  BNP (last 3 results) No results for input(s): BNP in the last 8760 hours.  ProBNP (last 3 results) No results for input(s): PROBNP in the last 8760 hours.  CBG: No results for input(s): GLUCAP in the last 168 hours.  Radiological Exams on Admission: CT ABDOMEN PELVIS W CONTRAST  Result Date: 11/17/2020 CLINICAL DATA:  19-year-old female with back pain following spine surgery five days ago. Constipation. EXAM: CT ABDOMEN AND PELVIS WITH CONTRAST TECHNIQUE: Multidetector CT imaging of the abdomen and pelvis was performed using the standard protocol following bolus administration of intravenous contrast. CONTRAST:  52mL OMNIPAQUE IOHEXOL  300 MG/ML  SOLN COMPARISON:  CT Abdomen and Pelvis 04/11/2020. FINDINGS: Lower chest: Chronic bulky soft plaque or thrombus in the descending thoracic aorta is stable since June (series 3, image 4). Calcified aortic atherosclerosis. No cardiomegaly or pericardial effusion. Both lung bases are positive for dependent ground-glass and confluent opacity slightly greater on the right. Favor atelectasis. No pleural effusion. Hepatobiliary: Small volume perihepatic free fluid and new intrahepatic biliary ductal dilatation and periportal edema. Liver parenchymal enhancement is stable and within normal limits. Gallbladder remains within normal limits. Pancreas: Heterogeneously enhancing with confluent surrounding soft tissue edema and/or small volume free fluid. Dilated main pancreatic duct is new since June (series 3, image 25) but there is no obstructing mass or etiology identified. The duct tapers in the distal pancreas. No pancreatic necrosis identified. Spleen: Negative aside from adjacent soft tissue edema or trace free fluid. Adrenals/Urinary Tract: Adrenal glands remain within normal limits. Bilateral kidneys are stable. Renal enhancement and contrast excretion is symmetric and within normal limits. Stomach/Bowel: Oral contrast administered and has reached the distal small bowel but not yet the colon. The stomach is distended. But otherwise there is no abnormally dilated large or small bowel. Retained stool throughout the colon with intermittent diverticulosis (most pronounced in the right colon). No convincing small or large bowel inflammation. No free air. Vascular/Lymphatic: Severe Aortoiliac calcified atherosclerosis. Major arterial structures in the abdomen and pelvis remain patent. 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 There is also chronic severe stenosis at the aortoiliac bifurcation, but the major  arterial structures remain patent. Portal venous system is patent.  Splenic vein remains patent. No lymphadenopathy. Reproductive: Stable since June, within normal limits aside from parametrial venous enlargement (series 3, image 66). Other: No pelvic free fluid. Musculoskeletal: Postoperative changes to the posterior  elements at L3-L4 and L4-L5. Small volume residual postoperative soft tissue gas along the left erector spinae muscles. No unexpected osseous changes identified. Chronic left femur ORIF. IMPRESSION: 1. Positive for Acute Pancreatitis, with pancreatic and biliary ductal enlargement. No etiology of ductal obstruction is identified by CT. 2. Associated peripancreatic and periportal edema with small volume free fluid in the abdomen and pelvis. No pancreatic necrosis, organized or drainable fluid collection. 3. 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 But no major arterial occlusion is identified. - small but developing infrarenal abdominal aortic aneurysm with mural plaque or thrombus, 23 mm diameter and stable since June. - additional chronic bulky soft plaque or thrombus in the descending thoracic aorta, stable since June. 4. Recent posterior decompression of the lumbar spine with no adverse features. Electronically Signed   By: Odessa FlemingH  Hall M.D.   On: 11/17/2020 04:52   US Abdomen Limited RUQ (LIVER/GB)  Result Date: 11/17/2020 CLINICAL DATA:  Pancreatitis EXAM: ULTRASOUND ABDOMEN LIMITED RIGHT UPPER QUADRANT COMPARISON:  CT from same day FINDINGS: Gallbladder: No gallstones or wall thickening visualized. No sonographic Murphy sign noted by sonographer. Common bile duct: Diameter: 7.3 cm Liver: No focal lesion identified. Within normal limits in parenchymal echogenicity. Portal vein is patent on color Doppler imaging with normal direction of blood flow towards the liver. Other: There is a trace amount of free fluid in  the upper abdomen. The partially visualized pancreatic duct appears to be dilated. IMPRESSION: 1. No evidence for cholelithiasis. 2. Dilatation of the pancreatic duct and common bile duct raises concern for an obstructing process such as choledocholithiasis. Follow-up with MRCP/ERCP is recommended. 3. Trace free fluid in the upper abdomen. Electronically Signed   By: Katherine Mantlehristopher  Green M.D.   On: 11/17/2020 06:57      EKG: None    Assessment/Plan Present on Admission: . Acute pancreatitis . Spinal stenosis of lumbar region with neurogenic claudication . COPD (chronic obstructive pulmonary disease) (HCC) . Hypertension  Principal Problem:   Acute pancreatitis Active Problems:   Spinal stenosis of lumbar region with neurogenic claudication   COPD (chronic obstructive pulmonary disease) (HCC)   Hypertension  #1 acute pancreatitis CT scan of the abdomen showed acute pancreatitis with pancreatic and biliary ductal enlargement there was some periportal edema with small volume free fluid in the abdomen and pelvis Patient will be put on n.p.o. except medication Pain management will be started with IV Dilaudid and IV hydration    2. Recent laminectomy continue pain management with IV Dilaudid  3. COPD stable no acute exacerbation continue home inhaler  4. Tobacco abuse nicotine patch has been started tobacco abuse cessation counseling will be done   Severity of Illness: The appropriate patient status for this patient is INPATIENT. Inpatient status is judged to be reasonable and necessary in order to provide the required intensity of service to ensure the patient's safety. The patient's presenting symptoms, physical exam findings, and initial radiographic and laboratory data in the context of their chronic comorbidities is felt to place them at high risk for further clinical deterioration. Furthermore, it is not anticipated that the patient will be medically stable for discharge from the  hospital within 2 midnights of admission. The following factors support the patient status of inpatient.   " The patient's presenting symptoms include nausea and vomiting. " The worrisome physical exam findings include abdominal pain. " The initial radiographic and laboratory data are worrisome because of acute pancreatitis. "  The chronic co-morbidities include COPD and recent laminectomy discharged 5 days ago.   * I certify that at the point of admission it is my clinical judgment that the patient will require inpatient hospital care spanning beyond 2 midnights from the point of admission due to high intensity of service, high risk for further deterioration and high frequency of surveillance required.*    DVT prophylaxis: Lovenox  Code Status: Full  Family Communication: Son, John at bedside  Disposition Plan: Home when stable  Consults called: None  Admission status: Inpatient    Cristal Deer MD [facility] Pager [phone]  If 7PM-7AM, please contact night-coverage [email or web address] Password [facility]  11/17/2020, 10:17 AM

## 2020-11-17 NOTE — ED Triage Notes (Signed)
Pt BIB EMS for c/o back pain following a back surgery Monday 11/12/20. Pt also c/o constipation. Pt used an enema three times for c/o constipation following back surgery, with small bowel movement as result. Still c/o lower abdominal pain due to constipation.   States that she has not used her pain medications following surgery since 11/14/20  Arrives with N/V  EMS: BP 160/70 P 90 RR 16 97% RA Temp 98.0

## 2020-11-18 ENCOUNTER — Inpatient Hospital Stay (HOSPITAL_COMMUNITY): Payer: Medicare Other

## 2020-11-18 DIAGNOSIS — K5900 Constipation, unspecified: Secondary | ICD-10-CM

## 2020-11-18 DIAGNOSIS — J449 Chronic obstructive pulmonary disease, unspecified: Secondary | ICD-10-CM

## 2020-11-18 DIAGNOSIS — M48062 Spinal stenosis, lumbar region with neurogenic claudication: Secondary | ICD-10-CM

## 2020-11-18 DIAGNOSIS — E783 Hyperchylomicronemia: Secondary | ICD-10-CM

## 2020-11-18 DIAGNOSIS — F17200 Nicotine dependence, unspecified, uncomplicated: Secondary | ICD-10-CM

## 2020-11-18 LAB — COMPREHENSIVE METABOLIC PANEL
ALT: 11 U/L (ref 0–44)
AST: 17 U/L (ref 15–41)
Albumin: 2.4 g/dL — ABNORMAL LOW (ref 3.5–5.0)
Alkaline Phosphatase: 34 U/L — ABNORMAL LOW (ref 38–126)
Anion gap: 6 (ref 5–15)
BUN: 8 mg/dL (ref 8–23)
CO2: 25 mmol/L (ref 22–32)
Calcium: 7.7 mg/dL — ABNORMAL LOW (ref 8.9–10.3)
Chloride: 105 mmol/L (ref 98–111)
Creatinine, Ser: 0.83 mg/dL (ref 0.44–1.00)
GFR, Estimated: >60 mL/min (ref >60)
Glucose, Bld: 92 mg/dL (ref 70–99)
Potassium: 3.4 mmol/L — ABNORMAL LOW (ref 3.5–5.1)
Sodium: 136 mmol/L (ref 135–145)
Total Bilirubin: 0.7 mg/dL (ref 0.3–1.2)
Total Protein: 4.8 g/dL — ABNORMAL LOW (ref 6.5–8.1)

## 2020-11-18 MED ORDER — GADOBUTROL 1 MMOL/ML IV SOLN
5.0000 mL | Freq: Once | INTRAVENOUS | Status: AC | PRN
  Administered 2020-11-18: 5 mL via INTRAVENOUS

## 2020-11-18 MED ORDER — POTASSIUM CHLORIDE 10 MEQ/100ML IV SOLN
10.0000 meq | Freq: Once | INTRAVENOUS | Status: AC
  Administered 2020-11-18: 10 meq via INTRAVENOUS
  Filled 2020-11-18: qty 100

## 2020-11-18 NOTE — Progress Notes (Signed)
Patient ID: Elizabeth [name], female   DOB: [birth year], 80 y.o.   MRN: [id number]  PROGRESS NOTE    Elizabeth [name]  [id number] DOB: [birth year] DOA: 11/17/2020 PCP: [name], MD    Brief Narrative:  Elizabeth [name] is a 80 y.o. female with medical history significant for tobacco dependence, mild depression, renal calculi, COPD GERD and recent laminectomy discharge November 12, 2020 gradual onset of abdominal pain which she describes as located on the left side with burning worse with movement and associated with nausea and vomiting.  Found to have acute pancreatitis and admitted. Also, notes constipation.   Assessment & Plan:   Principal Problem:   Acute pancreatitis Active Problems:   Spinal stenosis of lumbar region with neurogenic claudication   COPD (chronic obstructive pulmonary disease) (HCC)   Hypertension   Acute pancreatitis CT scan of the abdomen showed cryptitis with pancreatic and biliary ductal enlargement there was some periportal edema with small volume free fluid in the abdomen and pelvis Patient will be put on n.p.o. except medication Pain management will be started with IV Dilaudid and IV hydration RUQ u/s reveals possible obstruction like choledocolithiasis and recommends MRCP. [facility] consulted, they will order MRCP and see patient tomorrow. She is no longer vomiting, and remains quite tender.  Constipation On colace May add dulcolax for now  H/o Diverticulitis  GERD On Pepcid  Neurogenic Claudication Recent laminectomy continue pain management with IV Dilaudid  COPD stable no acute exacerbation continue home inhaler  Hypertension On Norvasc, Catapres, Toprol, Avalide  Hypercholesterolemia Continue Zocor  Tobacco abuse nicotine patch has been started    DVT prophylaxis: Lovenox SQ Code Status: Full code  Family Communication: patient at bedside Disposition Plan: home when improved Remains inpatient pending work-up, need for IVF and IV pain  control.   Consultants:   [facility]  Procedures:  None  Antimicrobials: Anti-infectives (From admission, onward)   None       Subjective: Feels better. No longer throwing up. Has a lot of questions about why this happened.  Objective: Vitals:   11/17/20 0800 11/17/20 1141 11/17/20 1937 11/18/20 0510  BP: 140/66 (!) 147/57 (!) 136/47 (!) 129/51  Pulse: 82 78 80 81  Resp: 13 16 18 17   Temp:  98.8 F (37.1 C) 99.6 F (37.6 C) 99.5 F (37.5 C)  TempSrc:  Oral Oral Oral  SpO2: 96% 99% 94% 92%    Intake/Output Summary (Last 24 hours) at 11/18/2020 0931 Last data filed at 11/18/2020 [id number] Gross per 24 hour  Intake 2693.55 ml  Output --  Net 2693.55 ml   There were no vitals filed for this visit.  Examination:  General exam: Appears calm and comfortable  Respiratory system: Clear to auscultation. Respiratory effort normal. Cardiovascular system: S1 & S2 heard, RRR.  Gastrointestinal system: Abdomen is nondistended, soft and nontender.  Central nervous system: Alert and oriented. No focal neurological deficits. Extremities: Symmetric  Skin: No rashes Psychiatry: Judgement and insight appear normal. Mood & affect appropriate.     Data Reviewed: I have personally reviewed following labs and imaging studies  CBC: Recent Labs  Lab 11/17/20 0130 11/17/20 0143 11/17/20 1238  WBC 9.9  --  7.4  NEUTROABS 7.9*  --   --   HGB 13.1 13.3 10.8*  HCT 39.6 39.0 32.1*  MCV 94.7  --  95.5  PLT 247  --  [id number]   Basic Metabolic Panel: Recent Labs  Lab 11/17/20 0130 11/17/20 0143 11/17/20 1238 11/18/20  0201  NA 136 136  --  136  K 3.4* 3.5  --  3.4*  CL 96* 97*  --  105  CO2 27  --   --  25  GLUCOSE 148* 144*  --  92  BUN 14 16  --  8  CREATININE 0.87 0.80 0.70 0.83  CALCIUM 9.4  --   --  7.7*   GFR: Estimated Creatinine Clearance: 40.8 mL/min (by C-G formula based on SCr of 0.83 mg/dL). Liver Function Tests: Recent Labs  Lab 11/17/20 0130 11/18/20 0201   AST 25 17  ALT 14 11  ALKPHOS 58 34*  BILITOT 0.8 0.7  PROT 6.8 4.8*  ALBUMIN 3.7 2.4*   Recent Labs  Lab 11/17/20 0130  LIPASE 8,117*   Lipid Profile: Recent Labs    11/17/20 0525  TRIG 104   Sepsis Labs: Recent Labs  Lab 11/17/20 0130  LATICACIDVEN 1.2    Recent Results (from the past 240 hour(s))  Surgical pcr screen     Status: Abnormal   Collection Time: 11/08/20  1:08 PM   Specimen: Nasal Mucosa; Nasal Swab  Result Value Ref Range Status   MRSA, PCR NEGATIVE NEGATIVE Final   Staphylococcus aureus POSITIVE (A) NEGATIVE Final    Comment: (NOTE) The Xpert SA Assay (FDA approved for NASAL specimens in patients 98 years of age and older), is one component of a comprehensive surveillance program. It is not intended to diagnose infection nor to guide or monitor treatment. Performed at [facility], [place] [address], [place], [place] [address]   SARS CORONAVIRUS 2 (TAT 6-24 HRS) Nasopharyngeal Nasopharyngeal Swab     Status: None   Collection Time: 11/08/20  2:44 PM   Specimen: Nasopharyngeal Swab  Result Value Ref Range Status   SARS Coronavirus 2 NEGATIVE NEGATIVE Final    Comment: (NOTE) SARS-CoV-2 target nucleic acids are NOT DETECTED.  The SARS-CoV-2 RNA is generally detectable in upper and lower respiratory specimens during the acute phase of infection. Negative results do not preclude SARS-CoV-2 infection, do not rule out co-infections with other pathogens, and should not be used as the sole basis for treatment or other patient management decisions. Negative results must be combined with clinical observations, patient history, and epidemiological information. The expected result is Negative.  Fact Sheet for Patients: SugarRoll.be  Fact Sheet for Healthcare Providers: https://www.woods-mathews.com/  This test is not yet approved or cleared by the [place] FDA and  has been authorized for detection  and/or diagnosis of SARS-CoV-2 by FDA under an Emergency Use Authorization (EUA). This EUA will remain  in effect (meaning this test can be used) for the duration of the COVID-19 declaration under Se ction 564(b)(1) of the Act, 21 U.S.C. section 360bbb-3(b)(1), unless the authorization is terminated or revoked sooner.  Performed at [facility], [place] [address], [place], [place] [address]   SARS CORONAVIRUS 2 (TAT 6-24 HRS) Nasopharyngeal Nasopharyngeal Swab     Status: None   Collection Time: 11/17/20  5:33 AM   Specimen: Nasopharyngeal Swab  Result Value Ref Range Status   SARS Coronavirus 2 NEGATIVE NEGATIVE Final    Comment: (NOTE) SARS-CoV-2 target nucleic acids are NOT DETECTED.  The SARS-CoV-2 RNA is generally detectable in upper and lower respiratory specimens during the acute phase of infection. Negative results do not preclude SARS-CoV-2 infection, do not rule out co-infections with other pathogens, and should not be used as the sole basis for treatment or other patient management decisions.  Negative results must be combined with clinical observations, patient history, and epidemiological information. The expected result is Negative.  Fact Sheet for Patients: SugarRoll.be  Fact Sheet for Healthcare Providers: https://www.woods-mathews.com/  This test is not yet approved or cleared by the [place] FDA and  has been authorized for detection and/or diagnosis of SARS-CoV-2 by FDA under an Emergency Use Authorization (EUA). This EUA will remain  in effect (meaning this test can be used) for the duration of the COVID-19 declaration under Se ction 564(b)(1) of the Act, 21 U.S.C. section 360bbb-3(b)(1), unless the authorization is terminated or revoked sooner.  Performed at [facility], [place] [address], [place], [place] [address]       Radiology Studies: CT ABDOMEN PELVIS W CONTRAST  Result Date:  11/17/2020 CLINICAL DATA:  89-year-old female with back pain following spine surgery five days ago. Constipation. EXAM: CT ABDOMEN AND PELVIS WITH CONTRAST TECHNIQUE: Multidetector CT imaging of the abdomen and pelvis was performed using the standard protocol following bolus administration of intravenous contrast. CONTRAST:  77mL OMNIPAQUE IOHEXOL 300 MG/ML  SOLN COMPARISON:  CT Abdomen and Pelvis 04/11/2020. FINDINGS: Lower chest: Chronic bulky soft plaque or thrombus in the descending thoracic aorta is stable since June (series 3, image 4). Calcified aortic atherosclerosis. No cardiomegaly or pericardial effusion. Both lung bases are positive for dependent ground-glass and confluent opacity slightly greater on the right. Favor atelectasis. No pleural effusion. Hepatobiliary: Small volume perihepatic free fluid and new intrahepatic biliary ductal dilatation and periportal edema. Liver parenchymal enhancement is stable and within normal limits. Gallbladder remains within normal limits. Pancreas: Heterogeneously enhancing with confluent surrounding soft tissue edema and/or small volume free fluid. Dilated main pancreatic duct is new since June (series 3, image 25) but there is no obstructing mass or etiology identified. The duct tapers in the distal pancreas. No pancreatic necrosis identified. Spleen: Negative aside from adjacent soft tissue edema or trace free fluid. Adrenals/Urinary Tract: Adrenal glands remain within normal limits. Bilateral kidneys are stable. Renal enhancement and contrast excretion is symmetric and within normal limits. Stomach/Bowel: Oral contrast administered and has reached the distal small bowel but not yet the colon. The stomach is distended. But otherwise there is no abnormally dilated large or small bowel. Retained stool throughout the colon with intermittent diverticulosis (most pronounced in the right colon). No convincing small or large bowel inflammation. No free air.  Vascular/Lymphatic: Severe Aortoiliac calcified atherosclerosis. Major arterial structures in the abdomen and pelvis remain patent. 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 There is also chronic severe stenosis at the aortoiliac bifurcation, but the major arterial structures remain patent. Portal venous system is patent.  Splenic vein remains patent. No lymphadenopathy. Reproductive: Stable since June, within normal limits aside from parametrial venous enlargement (series 3, image 66). Other: No pelvic free fluid. Musculoskeletal: Postoperative changes to the posterior elements at L3-L4 and L4-L5. Small volume residual postoperative soft tissue gas along the left erector spinae muscles. No unexpected osseous changes identified. Chronic left femur ORIF. IMPRESSION: 1. Positive for Acute Pancreatitis, with pancreatic and biliary ductal enlargement. No etiology of ductal obstruction is identified by CT. 2. Associated peripancreatic and periportal edema with small volume free fluid in the abdomen and pelvis. No pancreatic necrosis, organized or drainable fluid collection. 3. 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 But no major arterial occlusion is identified. -  small but developing infrarenal abdominal aortic aneurysm with mural plaque or thrombus, 23 mm diameter and stable since June. - additional chronic bulky soft plaque or thrombus in the descending thoracic aorta, stable since June. 4. Recent posterior decompression of the lumbar spine with no adverse features. Electronically Signed   By: Genevie Ann M.D.   On: 11/17/2020 04:52   US Abdomen Limited RUQ (LIVER/GB)  Result Date: 11/17/2020 CLINICAL DATA:  Pancreatitis EXAM: ULTRASOUND ABDOMEN LIMITED RIGHT UPPER QUADRANT COMPARISON:  CT from same day FINDINGS: Gallbladder: No  gallstones or wall thickening visualized. No sonographic Murphy sign noted by sonographer. Common bile duct: Diameter: 7.3 cm Liver: No focal lesion identified. Within normal limits in parenchymal echogenicity. Portal vein is patent on color Doppler imaging with normal direction of blood flow towards the liver. Other: There is a trace amount of free fluid in the upper abdomen. The partially visualized pancreatic duct appears to be dilated. IMPRESSION: 1. No evidence for cholelithiasis. 2. Dilatation of the pancreatic duct and common bile duct raises concern for an obstructing process such as choledocholithiasis. Follow-up with MRCP/ERCP is recommended. 3. Trace free fluid in the upper abdomen. Electronically Signed   By: Constance Holster M.D.   On: 11/17/2020 06:57     Scheduled Meds: . amLODipine  5 mg Oral Daily  . aspirin EC  81 mg Oral QPM  . cloNIDine  0.1 mg Oral QHS  . docusate sodium  100 mg Oral BID  . enoxaparin (LOVENOX) injection  40 mg Subcutaneous Q24H  . gabapentin  200 mg Oral QHS  . irbesartan  150 mg Oral Daily   And  . hydrochlorothiazide  12.5 mg Oral Daily  . metoprolol succinate  25 mg Oral QPM  . nicotine  14 mg Transdermal Daily  . pantoprazole (PROTONIX) IV  40 mg Intravenous Q24H  . simvastatin  40 mg Oral QPM   Continuous Infusions: . sodium chloride 125 mL/hr at 11/18/20 0708     LOS: 1 day    Donnamae Jude, MD 11/18/2020 9:31 AM [phone] [facility] If 7PM-7AM, please contact night-coverage 11/18/2020, 9:31 AM

## 2020-11-19 DIAGNOSIS — J410 Simple chronic bronchitis: Secondary | ICD-10-CM

## 2020-11-19 DIAGNOSIS — K8500 Idiopathic acute pancreatitis without necrosis or infection: Secondary | ICD-10-CM

## 2020-11-19 LAB — CBC
HCT: 32.1 % — ABNORMAL LOW (ref 36.0–46.0)
Hemoglobin: 10.3 g/dL — ABNORMAL LOW (ref 12.0–15.0)
MCH: 31.2 pg (ref 26.0–34.0)
MCHC: 32.1 g/dL (ref 30.0–36.0)
MCV: 97.3 fL (ref 80.0–100.0)
Platelets: 177 10*3/uL (ref 150–400)
RBC: 3.3 MIL/uL — ABNORMAL LOW (ref 3.87–5.11)
RDW: 12.1 % (ref 11.5–15.5)
WBC: 8.1 10*3/uL (ref 4.0–10.5)
nRBC: 0 % (ref 0.0–0.2)

## 2020-11-19 LAB — COMPREHENSIVE METABOLIC PANEL
ALT: 12 U/L (ref 0–44)
AST: 24 U/L (ref 15–41)
Albumin: 2.4 g/dL — ABNORMAL LOW (ref 3.5–5.0)
Alkaline Phosphatase: 42 U/L (ref 38–126)
Anion gap: 14 (ref 5–15)
BUN: 14 mg/dL (ref 8–23)
CO2: 17 mmol/L — ABNORMAL LOW (ref 22–32)
Calcium: 7.9 mg/dL — ABNORMAL LOW (ref 8.9–10.3)
Chloride: 105 mmol/L (ref 98–111)
Creatinine, Ser: 0.83 mg/dL (ref 0.44–1.00)
GFR, Estimated: >60 mL/min (ref >60)
Glucose, Bld: 55 mg/dL — ABNORMAL LOW (ref 70–99)
Potassium: 3.6 mmol/L (ref 3.5–5.1)
Sodium: 136 mmol/L (ref 135–145)
Total Bilirubin: 1.4 mg/dL — ABNORMAL HIGH (ref 0.3–1.2)
Total Protein: 4.8 g/dL — ABNORMAL LOW (ref 6.5–8.1)

## 2020-11-19 LAB — LIPASE, BLOOD: Lipase: 145 U/L — ABNORMAL HIGH (ref 11–51)

## 2020-11-19 MED ORDER — SODIUM CHLORIDE 0.9 % IV SOLN
1000.0000 mL | INTRAVENOUS | Status: DC
  Administered 2020-11-19 – 2020-11-20 (×2): 1000 mL via INTRAVENOUS

## 2020-11-19 MED ORDER — POLYETHYLENE GLYCOL 3350 17 G PO PACK
17.0000 g | PACK | Freq: Every day | ORAL | Status: DC
  Administered 2020-11-19 – 2020-11-20 (×2): 17 g via ORAL
  Filled 2020-11-19 (×3): qty 1

## 2020-11-19 MED ORDER — BISACODYL 10 MG RE SUPP
10.0000 mg | Freq: Once | RECTAL | Status: AC
  Administered 2020-11-19: 10 mg via RECTAL
  Filled 2020-11-19: qty 1

## 2020-11-19 NOTE — Consult Note (Signed)
Referring Provider: [facility] Primary Care Physician:  [name], MD Primary Gastroenterologist: [facility] (previously seen by Dr. Mindi Curling)  Reason for Consultation: Pancreatitis  HPI: Elizabeth [name] is a 80 y.o. female with no prior history of pancreatitis who is now 1 week status post posterior laminectomy for spinal stenosis, who presented to the emergency room 2 days ago with left-sided abdominal pain, nausea, and vomiting, with CT evidence of acute pancreatitis and pancreas divisum, and a markedly elevated lipase level of 8100 which, since admission, has dropped precipitously to 145 (normal is up to 51).    Since admission, there has been an appropriate drop in hemoglobin with hydration, white count is normal, liver chemistries are normal.  Her abdominal pain is improved, "about 40% better," and she actually feels hungry and is tolerating small amounts of a clear liquid diet.  There is no evidence of choledocholithiasis or cholelithiasis or biliary ductal dilatation on MRCP (although a CT scan on admission have raised the question of some intrahepatic biliary ductal dilatation).  The pancreatic ductal anatomy is pancreas divisum with some dilatation of the main pancreatic duct but no evidence of pancreatic mass.  Of note, the patient has just social, occasional ethanol intake.     Past Medical History:  Diagnosis Date  . Abdominal cramping    possibly irritable bowel syndrome versus adhesions secondary to multiple surgeries and abdominal inflammation and 2009 secondary to diverticulitis and diverticular phlegmon  . Allergic rhinitis   . Arthritis   . Carotid artery disease (HCC)    0000000 RICA, < [id number] LICA [id number] [name] ([facility])  . Cataract   . Chest pain syndrome 2004   with adenosine Cardiolite that was normal  . Chronic hoarseness   . COPD (chronic obstructive pulmonary disease) ([place])   . GERD (gastroesophageal reflux disease)   . Heart murmur    Mild AS, AI. AV pk  grad 18.79 mmHg, mn grad 11.20 mmHg, AVA (VTI) 1.05 cm2 10/10/20, 3 year f/u rec ([facility])  . History of kidney stones   . History of renal calculi   . Hypercholesterolemia   . Hypertension   . Incontinence    Cough incontinence  . Mild depression ([place])   . Neuromuscular disorder (HCC)    neuropathy bilateral lower extremities  . Osteopenia   . Peripheral vascular disease ([place])    with femoral and carotid bruits  . PONV (postoperative nausea and vomiting)   . Tobacco dependence   . Vitamin D deficiency     Past Surgical History:  Procedure Laterality Date  . APPENDECTOMY    . BREAST BIOPSY    . CATARACT EXTRACTION, BILATERAL    . COLON SURGERY     2009  . COSMETIC SURGERY     on neck and eyes  . DILATION AND CURETTAGE OF UTERUS    . EYE SURGERY     2014  . FEMUR SURGERY  12/2007   left femur surgery--for femoral neck fracture status post fall   . FRACTURE SURGERY     broke left leg and left arm- 2012  . LIPOSUCTION    . LUMBAR LAMINECTOMY/DECOMPRESSION MICRODISCECTOMY N/A 11/12/2020   Procedure: LAMINECTOMY AND FORAMINOTOMY Lumbar three four, Lumbar four five, Lumbar Five Sacral one;  Surgeon: Newman Pies, MD;  Location: [place];  Service: Neurosurgery;  Laterality: N/A;  . OTHER SURGICAL HISTORY     sigmoid colectomy  . SHOULDER SURGERY     arthroscopic shoulder surgery   . TONSILLECTOMY    .  TUBAL LIGATION      Prior to Admission medications   Medication Sig Start Date End Date Taking? Authorizing Provider  amLODipine (NORVASC) 5 MG tablet Take 5 mg by mouth daily. 02/14/20  Yes [provider]  aspirin 81 MG tablet Take 81 mg by mouth every evening.    Yes [provider]  Besifloxacin HCl (BESIVANCE) 0.6 % SUSP Place 1 drop into both eyes in the morning and at bedtime.   Yes [provider]  cloNIDine (CATAPRES) 0.1 MG tablet Take 0.1 mg by mouth at bedtime.    Yes [provider]  Coenzyme Q10 100 MG capsule Take 100 mg by  mouth daily.   Yes [provider]  cycloSPORINE (RESTASIS) 0.05 % ophthalmic emulsion Place 1 drop into both eyes 2 (two) times daily.   Yes [provider]  docusate sodium (COLACE) 100 MG capsule Take 1 capsule (100 mg total) by mouth 2 (two) times daily. Patient taking differently: Take 100 mg by mouth 2 (two) times daily as needed for mild constipation. 11/12/20  Yes Newman Pies, MD  famotidine (PEPCID) 20 MG tablet Take 20 mg by mouth every other day.    Yes [provider]  gabapentin (NEURONTIN) 100 MG capsule Take 2 capsules (200 mg total) by mouth at bedtime. PATIENT NEEDS OFFICE VISIT FOR ADDITIONAL REFILLS 07/30/15  Yes [name], MD  HYDROcodone-acetaminophen (NORCO/VICODIN) 5-325 MG tablet Take 1 tablet by mouth every 4 (four) hours as needed for moderate pain. 11/12/20  Yes Newman Pies, MD  ibuprofen (ADVIL,MOTRIN) 200 MG tablet Take 400 mg by mouth every 6 (six) hours as needed for moderate pain.    Yes [provider]  INCRUSE ELLIPTA 62.5 MCG/INH AEPB Inhale 1 puff into the lungs daily as needed (asthma). 03/19/20  Yes [provider]  irbesartan-hydrochlorothiazide (AVALIDE) 150-12.5 MG per tablet Take 1 tablet by mouth daily.   Yes [provider]  meloxicam (MOBIC) 15 MG tablet Take 15 mg by mouth daily. 11/01/20  Yes [provider]  metoprolol succinate (TOPROL-XL) 25 MG 24 hr tablet Take 25 mg by mouth every evening.    Yes [provider]  Multiple Vitamin (MULTIVITAMIN) tablet Take 1 tablet by mouth daily.   Yes [provider]  simvastatin (ZOCOR) 40 MG tablet Take 40 mg by mouth every evening.    Yes [provider]  Turmeric 500 MG CAPS Take 500 mg by mouth daily.   Yes [provider]  vitamin B-12 (CYANOCOBALAMIN) 100 MCG tablet Take 100 mcg by mouth daily.   Yes [provider]  vitamin E 180 MG (400 UNITS) capsule Take 400 Units by mouth daily.   Yes  [provider]  azithromycin (ZITHROMAX) 250 MG tablet azithromycin 250 mg tablet Patient not taking: No sig reported    [provider]  dextromethorphan-guaiFENesin (ROBITUSSIN-DM) 10-100 MG/5ML liquid dextromethorphan-guaifenesin 10 mg-100 mg/5 mL oral syrup  Take 10 mL every 4 hours by oral route for 5 days. Patient not taking: No sig reported    [provider]  levofloxacin (LEVAQUIN) 500 MG tablet Levaquin 500 mg tablet  Take 1 tablet every 24 hours by oral route for 7 days. Patient not taking: No sig reported    [provider]    Current Facility-Administered Medications  Medication Dose Route Frequency Provider Last Rate Last Admin  . 0.9 %  sodium chloride infusion  1,000 mL Intravenous Continuous Cristal Deer, MD 125 mL/hr at 11/19/20 0015  1,000 mL at 11/19/20 0015  . amLODipine (NORVASC) tablet 5 mg  5 mg Oral Daily Cristal Deer, MD   5 mg at 11/19/20 0928  . aspirin EC tablet 81 mg  81 mg Oral QPM Cristal Deer, MD   81 mg at 11/18/20 1837  . cloNIDine (CATAPRES) tablet 0.1 mg  0.1 mg Oral QHS Cristal Deer, MD   0.1 mg at 11/18/20 2045  . docusate sodium (COLACE) capsule 100 mg  100 mg Oral BID Cristal Deer, MD   100 mg at 11/19/20 0928  . enoxaparin (LOVENOX) injection 40 mg  40 mg Subcutaneous Q24H Cristal Deer, MD   40 mg at 11/18/20 1218  . gabapentin (NEURONTIN) capsule 200 mg  200 mg Oral QHS Cristal Deer, MD   200 mg at 11/18/20 2045  . HYDROcodone-acetaminophen (NORCO/VICODIN) 5-325 MG per tablet 1 tablet  1 tablet Oral Q4H PRN Cristal Deer, MD   1 tablet at 11/19/20 0928  . HYDROmorphone (DILAUDID) injection 0.5 mg  0.5 mg Intravenous Q4H PRN Cristal Deer, MD   0.5 mg at 11/18/20 1210  . irbesartan (AVAPRO) tablet 150 mg  150 mg Oral Daily Cristal Deer, MD   150 mg at 11/19/20 0928  . metoprolol succinate (TOPROL-XL) 24 hr tablet 25 mg  25 mg Oral QPM Cristal Deer, MD   25 mg at 11/18/20 1837   . nicotine (NICODERM CQ - dosed in mg/24 hours) patch 14 mg  14 mg Transdermal Daily Cristal Deer, MD   14 mg at 11/19/20 0927  . ondansetron (ZOFRAN) injection 4 mg  4 mg Intravenous Q6H PRN Cristal Deer, MD   4 mg at 11/17/20 1549  . pantoprazole (PROTONIX) injection 40 mg  40 mg Intravenous Q24H Cristal Deer, MD   40 mg at 11/18/20 1253  . polyethylene glycol (MIRALAX / GLYCOLAX) packet 17 g  17 g Oral Daily Rai, Ripudeep K, MD   17 g at 11/19/20 0928  . simvastatin (ZOCOR) tablet 40 mg  40 mg Oral QPM Cristal Deer, MD   40 mg at 11/18/20 1837  . umeclidinium bromide (INCRUSE ELLIPTA) 62.5 MCG/INH 1 puff  1 puff Inhalation Daily PRN Cristal Deer, MD        Allergies as of 11/17/2020 - Review Complete 11/17/2020  Allergen Reaction Noted  . Other  11/21/2013  . Pollen extract  04/27/2014  . Tegretol [carbamazepine]  11/21/2013  . Lisinopril Rash 11/21/2013  . Tetracyclines & related Rash 11/21/2013    Family History  Problem Relation Age of Onset  . Heart disease Mother   . Stroke Mother     Social History   Socioeconomic History  . Marital status: Widowed    Spouse name: Not on file  . Number of children: Not on file  . Years of education: Not on file  . Highest education level: Not on file  Occupational History  . Not on file  Tobacco Use  . Smoking status: Current Every Day Smoker    Packs/day: 0.50    Years: 60.00    Pack years: 30.00    Types: Cigarettes  . Smokeless tobacco: Never Used  Vaping Use  . Vaping Use: Never used  Substance and Sexual Activity  . Alcohol use: Yes    Alcohol/week: 0.0 standard drinks    Comment: Occ glass of wine  . Drug use: No  . Sexual activity: Not on file  Other Topics Concern  . Not on file  Social History Narrative  . Not on  file   Social Determinants of Health   Financial Resource Strain: Not on file  Food Insecurity: Not on file  Transportation Needs: Not on file  Physical Activity: Not on file   Stress: Not on file  Social Connections: Not on file  Intimate Partner Violence: Not on file    Review of Systems: The patient is generally quite active, although her spinal stenosis slowed her down in recent years.  Prior to that, she worked out regularly.  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  She does have a tendency toward constipation, perhaps related to the spinal stenosis.  In the past, she was regular as clockwork, with a bowel movement once a day, now she sometimes needs a laxative every several days to keep things moving.  Physical Exam: Vital signs in last 24 hours: Temp:  [98.7 F (37.1 C)-98.8 F (37.1 C)] 98.7 F (37.1 C) (01/17 0600) Pulse Rate:  [71-77] 77 (01/17 0600) Resp:  [12] 12 (01/17 0600) BP: (108-133)/(42-53) 133/51 (01/17 0600) SpO2:  [91 %-97 %] 91 % (01/17 0600)   General:   Alert,  Well-developed, well-nourished, pleasant and cooperative in NAD Head:  Normocephalic and atraumatic. Eyes:  Sclera clear, no icterus.   Conjunctiva pink. Lungs:  Clear throughout to auscultation.  Reasonably good air movement noted.   No wheezes, crackles, or rhonchi. No evident respiratory distress. Heart:   Regular rate and rhythm; no murmurs, clicks, rubs,  or gallops. Abdomen: No distention.  Bowel sounds present.  Diffuse tenderness with some guarding, but not exquisite tenderness. Msk:   Symmetrical without gross deformities. Pulses:  Normal radial pulse is noted. Extremities:   Without clubbing, cyanosis, or edema. Neurologic:  Alert and coherent;  grossly normal neurologically.  Seems cognitively very sharp. Skin:  Intact without significant lesions or rashes. Psych:   Alert and cooperative. Normal mood and affect.  Intake/Output from previous day: 01/16 0701 - 01/17 0700 In: 2245.5 [P.O.:60; I.V.:2185.5] Out: 650 [Urine:300] Intake/Output this shift: No intake/output data recorded.  Lab Results: Recent Labs    11/17/20 0130  11/17/20 0143 11/17/20 1238 11/19/20 0233  WBC 9.9  --  7.4 8.1  HGB 13.1 13.3 10.8* 10.3*  HCT 39.6 39.0 32.1* 32.1*  PLT 247  --  191 177   BMET Recent Labs    11/17/20 0130 11/17/20 0143 11/17/20 1238 11/18/20 0201 11/19/20 0233  NA 136 136  --  136 136  K 3.4* 3.5  --  3.4* 3.6  CL 96* 97*  --  105 105  CO2 27  --   --  25 17*  GLUCOSE 148* 144*  --  92 55*  BUN 14 16  --  8 14  CREATININE 0.87 0.80 0.70 0.83 0.83  CALCIUM 9.4  --   --  7.7* 7.9*   LFT Recent Labs    11/19/20 0233  PROT 4.8*  ALBUMIN 2.4*  AST 24  ALT 12  ALKPHOS 42  BILITOT 1.4*   PT/INR No results for input(s): LABPROT, INR in the last 72 hours.  Studies/Results: MR ABDOMEN MRCP W WO CONTAST  Result Date: 11/19/2020 CLINICAL DATA:  Pancreatitis suspected EXAM: MRI ABDOMEN WITHOUT AND WITH CONTRAST (INCLUDING MRCP) TECHNIQUE: Multiplanar multisequence MR imaging of the abdomen was performed both before and after the administration of intravenous contrast. Heavily T2-weighted images of the biliary and pancreatic ducts were obtained, and three-dimensional MRCP images were rendered by post processing. CONTRAST:  30mL GADAVIST GADOBUTROL 1 MMOL/ML IV  SOLN COMPARISON:  CT abdomen pelvis, abdominal ultrasound, 11/17/2020 FINDINGS: Lower chest: Small bilateral pleural effusions and associated atelectasis or consolidation. Hepatobiliary: No mass or other parenchymal abnormality identified. The gallbladder is distended, however there are no gallstones identified. No biliary ductal dilatation. The common bile duct measures up to 6 mm in caliber and is patent to the ampulla without calculus or other filling defect. Pancreas: There is diffuse inflammatory fat stranding about the pancreas and adjacent retroperitoneum. There is no evidence of acute pancreatic fluid collection or parenchymal hypoenhancement to suggest necrosis. There is pancreatic divisum. The pancreatic duct is diffusely dilated to the papillae,  measuring up to 7 mm. No mass or other parenchymal abnormality identified. Spleen:  Within normal limits in size and appearance. Adrenals/Urinary Tract: No masses identified. No evidence of hydronephrosis. Stomach/Bowel: Visualized portions within the abdomen are unremarkable. Vascular/Lymphatic: No pathologically enlarged lymph nodes identified. No abdominal aortic aneurysm demonstrated. Other: Very extensive inflammatory fat stranding throughout the visualized retroperitoneum. Small volume ascites throughout the abdomen. Anasarca. Musculoskeletal: No suspicious bone lesions identified. IMPRESSION: 1. Acute pancreatitis. No evidence of acute pancreatic fluid collection or parenchymal hypoenhancement to suggest necrosis. There is very extensive inflammatory fat stranding about the entirety of the included retroperitoneum. 2. There is pancreatic divisum and the pancreatic duct is diffusely dilated to the papillae, measuring up to 7 mm. No pancreatic mass or other parenchymal abnormality identified. 3. The gallbladder is distended, however there are no gallstones identified. No biliary ductal dilatation. 4. Pleural effusions, ascites, and anasarca. Electronically Signed   By: Eddie Candle M.D.   On: 11/19/2020 07:46    Impression: Acute pancreatitis in the postoperative setting, associated with pancreas divisum which is the likely precipitating factor.  Biochemically and clinically, she seems to be making very good progress, and adverse prognostic indicators such as rise in hemoglobin or severe leukocytosis are absent.  Plan: No further diagnostic evaluation needed at this time.  Follow symptoms, continue supportive care, and gradually advance diet as tolerated.  Lengthy discussion with patient regarding causation, management, and natural history of pancreatitis.  I think the patient's prognosis is quite good, not only for an uneventful, uncomplicated resolution of the current episode of pancreatitis, but  also, for the absence of recurrent episodes in the future.  We will follow with you for the time being.   LOS: 2 days   Youlanda Mighty Tinaya Ceballos  11/19/2020, 12:00 PM   Pager [phone] If no answer or after 5 PM call [phone]

## 2020-11-19 NOTE — Progress Notes (Signed)
[facility]                                                                              Patient Demographics  Elizabeth [name], is a 80 y.o. female, DOB - 03/09/1941, [id number]  Admit date - 11/17/2020   Admitting Physician Cristal Deer, MD  Outpatient Primary MD for the patient is [name], MD  Outpatient specialists:   LOS - 2  days   Medical records reviewed and are as summarized below:    Chief Complaint  Patient presents with  . Back Pain  . Abdominal Pain       Brief summary   Elizabeth [name] a 80 y.o.femalewith medical history significant fortobacco dependence, mild depression, renal calculi, COPD GERD andrecent laminectomy discharge November 12, 2020 gradual onset of abdominal pain which she describes as located on the left side with burning worse with movement and associated with nausea and vomiting.Found to have acute pancreatitis and admitted. Also, notes constipation.   Assessment & Plan      Acute pancreatitis -CT abdomen showedacute pancreatitis with pancreatic and biliary ductal enlargement, no etiology of ductal obstruction, pancreatic necrosis or drainable fluid collection  - RUQ u/s reveals possible obstruction like choledocolithiasis -Placed on n.p.o. status, IV fluids, pain control -GI consulted, MRCP showed acute pancreatitis, no necrosis, very extensive inflammatory fat stranding about the entirety of the retroperitoneal.  Pancreatic divisum and pancreatic duct is diffusely dilated to the papula measuring up to 7 mm.  No pancreatic mass, gallbladder is distended but no gallstones or biliary ductal dilatation. -Per GI, continue supportive care no further testing evaluation needed at this time.,  Advance diet to clear liquids, full liquids if tolerated -Lipase improved from 8117 to 145 -Triglycerides 104  Constipation Continue Colace, add MiraLAX  H/o Diverticulitis -Currently no acute diverticulitis  GERD On  Pepcid  Neurogenic Claudication  - Recent laminectomy  - continue pain management with IV Dilaudid  COPD  - stable no acute exacerbation continue home inhaler  Hypertension On Norvasc, Catapres, Toprol, Avalide -Added IV hydralazine as needed with parameters  Hypercholesterolemia Continue Zocor  Tobacco abuse  -Continue nicotine patch    Code Status full CODE STATUS DVT Prophylaxis:  Lovenox Family Communication: Discussed all imaging results, lab results, explained to the patient   Disposition Plan:     Status is: Inpatient  Remains inpatient appropriate because:Inpatient level of care appropriate due to severity of illness   Dispo: The patient is from: Home              Anticipated d/c is to: Home              Anticipated d/c date is: 2 days              Patient currently is not medically stable to d/c.  Possible DC in next 24 to 48 hours if tolerating solid diet      Time Spent in minutes   35 minutes  Procedures:  MRCP  Consultants:   Gastroenterology  Antimicrobials:   Anti-infectives (From admission, onward)   None          Medications  Scheduled Meds: . amLODipine  5 mg Oral Daily  . aspirin EC  81 mg Oral QPM  . cloNIDine  0.1 mg Oral QHS  . docusate sodium  100 mg Oral BID  . enoxaparin (LOVENOX) injection  40 mg Subcutaneous Q24H  . gabapentin  200 mg Oral QHS  . irbesartan  150 mg Oral Daily  . metoprolol succinate  25 mg Oral QPM  . nicotine  14 mg Transdermal Daily  . pantoprazole (PROTONIX) IV  40 mg Intravenous Q24H  . polyethylene glycol  17 g Oral Daily  . simvastatin  40 mg Oral QPM   Continuous Infusions: . sodium chloride 1,000 mL (11/19/20 0015)   PRN Meds:.HYDROcodone-acetaminophen, HYDROmorphone (DILAUDID) injection, ondansetron (ZOFRAN) IV, umeclidinium bromide      Subjective:   Elizabeth [name] was seen and examined today.  States starting to feel better however not close to baseline yet.  Still has abdominal  pain but improving.  No acute nausea vomiting fevers or chills. Patient denies dizziness, chest pain, shortness of breath, new weakness, numbess, tingling. No acute events overnight.  + Constipated  Objective:   Vitals:   11/18/20 1231 11/18/20 1834 11/18/20 2045 11/19/20 0600  BP: (!) 108/43 (!) 129/53 (!) 116/42 (!) 133/51  Pulse: 71 77 75 77  Resp:   12 12  Temp:   98.8 F (37.1 C) 98.7 F (37.1 C)  TempSrc:   Oral Oral  SpO2: 97% 97% 96% 91%    Intake/Output Summary (Last 24 hours) at 11/19/2020 1417 Last data filed at 11/19/2020 1200 Gross per 24 hour  Intake 2168.53 ml  Output 1450 ml  Net 718.53 ml     Wt Readings from Last 3 Encounters:  11/12/20 50 kg  11/08/20 50 kg  04/11/20 47.2 kg     Exam  General: Alert and oriented x 3, NAD  Cardiovascular: S1 S2 auscultated, no murmurs, RRR  Respiratory: Clear to auscultation bilaterally, no wheezing, rales or rhonchi  Gastrointestinal: Soft, epigastric and upper abdominal TTP, nondistended, + bowel sounds  Ext: no pedal edema bilaterally  Neuro: no new deficits  Musculoskeletal: No digital cyanosis, clubbing  Skin: No rashes  Psych: Normal affect and demeanor, alert and oriented x3    Data Reviewed:  I have personally reviewed following labs and imaging studies  Micro Results Recent Results (from the past 240 hour(s))  SARS CORONAVIRUS 2 (TAT 6-24 HRS) Nasopharyngeal Nasopharyngeal Swab     Status: None   Collection Time: 11/17/20  5:33 AM   Specimen: Nasopharyngeal Swab  Result Value Ref Range Status   SARS Coronavirus 2 NEGATIVE NEGATIVE Final    Comment: (NOTE) SARS-CoV-2 target nucleic acids are NOT DETECTED.  The SARS-CoV-2 RNA is generally detectable in upper and lower respiratory specimens during the acute phase of infection. Negative results do not preclude SARS-CoV-2 infection, do not rule out co-infections with other pathogens, and should not be used as the sole basis for treatment or  other patient management decisions. Negative results must be combined with clinical observations, patient history, and epidemiological information. The expected result is Negative.  Fact Sheet for Patients: SugarRoll.be  Fact Sheet for Healthcare Providers: https://www.woods-mathews.com/  This test is not yet approved or cleared by the [place] FDA and  has been authorized for detection and/or diagnosis of SARS-CoV-2 by FDA under an Emergency Use Authorization (EUA). This EUA will remain  in effect (meaning this test can be used) for the duration of the COVID-19 declaration under Se ction 564(b)(1)  of the Act, 21 U.S.C. section 360bbb-3(b)(1), unless the authorization is terminated or revoked sooner.  Performed at [facility], [place] [address], [place], [place] [address]     Radiology Reports CT ABDOMEN PELVIS W CONTRAST  Result Date: 11/17/2020 CLINICAL DATA:  19-year-old female with back pain following spine surgery five days ago. Constipation. EXAM: CT ABDOMEN AND PELVIS WITH CONTRAST TECHNIQUE: Multidetector CT imaging of the abdomen and pelvis was performed using the standard protocol following bolus administration of intravenous contrast. CONTRAST:  36mL OMNIPAQUE IOHEXOL 300 MG/ML  SOLN COMPARISON:  CT Abdomen and Pelvis 04/11/2020. FINDINGS: Lower chest: Chronic bulky soft plaque or thrombus in the descending thoracic aorta is stable since June (series 3, image 4). Calcified aortic atherosclerosis. No cardiomegaly or pericardial effusion. Both lung bases are positive for dependent ground-glass and confluent opacity slightly greater on the right. Favor atelectasis. No pleural effusion. Hepatobiliary: Small volume perihepatic free fluid and new intrahepatic biliary ductal dilatation and periportal edema. Liver parenchymal enhancement is stable and within normal limits. Gallbladder remains within normal limits. Pancreas: Heterogeneously  enhancing with confluent surrounding soft tissue edema and/or small volume free fluid. Dilated main pancreatic duct is new since June (series 3, image 25) but there is no obstructing mass or etiology identified. The duct tapers in the distal pancreas. No pancreatic necrosis identified. Spleen: Negative aside from adjacent soft tissue edema or trace free fluid. Adrenals/Urinary Tract: Adrenal glands remain within normal limits. Bilateral kidneys are stable. Renal enhancement and contrast excretion is symmetric and within normal limits. Stomach/Bowel: Oral contrast administered and has reached the distal small bowel but not yet the colon. The stomach is distended. But otherwise there is no abnormally dilated large or small bowel. Retained stool throughout the colon with intermittent diverticulosis (most pronounced in the right colon). No convincing small or large bowel inflammation. No free air. Vascular/Lymphatic: Severe Aortoiliac calcified atherosclerosis. Major arterial structures in the abdomen and pelvis remain patent. 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 There is also chronic severe stenosis at the aortoiliac bifurcation, but the major arterial structures remain patent. Portal venous system is patent.  Splenic vein remains patent. No lymphadenopathy. Reproductive: Stable since June, within normal limits aside from parametrial venous enlargement (series 3, image 66). Other: No pelvic free fluid. Musculoskeletal: Postoperative changes to the posterior elements at L3-L4 and L4-L5. Small volume residual postoperative soft tissue gas along the left erector spinae muscles. No unexpected osseous changes identified. Chronic left femur ORIF. IMPRESSION: 1. Positive for Acute Pancreatitis, with pancreatic and biliary ductal enlargement. No etiology of ductal obstruction is identified by CT. 2. Associated  peripancreatic and periportal edema with small volume free fluid in the abdomen and pelvis. No pancreatic necrosis, organized or drainable fluid collection. 3. 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 But no major arterial occlusion is identified. - small but developing infrarenal abdominal aortic aneurysm with mural plaque or thrombus, 23 mm diameter and stable since June. - additional chronic bulky soft plaque or thrombus in the descending thoracic aorta, stable since June. 4. Recent posterior decompression of the lumbar spine with no adverse features. Electronically Signed   By: Genevie Ann M.D.   On: 11/17/2020 04:52   DG Lumbar Spine 1 View  Result Date: 11/12/2020 CLINICAL DATA:  Intraop localization EXAM: LUMBAR SPINE - 1 VIEW COMPARISON:  CT abdomen/pelvis dated 04/11/2020 FINDINGS:  Single lateral view of the lumbar spine with a surgical probe at the L4-5 level. IMPRESSION: Intraoperative localization as above. Electronically Signed   By: Julian Hy M.D.   On: 11/12/2020 10:32   MR ABDOMEN MRCP W WO CONTAST  Result Date: 11/19/2020 CLINICAL DATA:  Pancreatitis suspected EXAM: MRI ABDOMEN WITHOUT AND WITH CONTRAST (INCLUDING MRCP) TECHNIQUE: Multiplanar multisequence MR imaging of the abdomen was performed both before and after the administration of intravenous contrast. Heavily T2-weighted images of the biliary and pancreatic ducts were obtained, and three-dimensional MRCP images were rendered by post processing. CONTRAST:  58mL GADAVIST GADOBUTROL 1 MMOL/ML IV SOLN COMPARISON:  CT abdomen pelvis, abdominal ultrasound, 11/17/2020 FINDINGS: Lower chest: Small bilateral pleural effusions and associated atelectasis or consolidation. Hepatobiliary: No mass or other parenchymal abnormality identified. The gallbladder is distended, however there are no gallstones identified. No biliary ductal dilatation. The common bile  duct measures up to 6 mm in caliber and is patent to the ampulla without calculus or other filling defect. Pancreas: There is diffuse inflammatory fat stranding about the pancreas and adjacent retroperitoneum. There is no evidence of acute pancreatic fluid collection or parenchymal hypoenhancement to suggest necrosis. There is pancreatic divisum. The pancreatic duct is diffusely dilated to the papillae, measuring up to 7 mm. No mass or other parenchymal abnormality identified. Spleen:  Within normal limits in size and appearance. Adrenals/Urinary Tract: No masses identified. No evidence of hydronephrosis. Stomach/Bowel: Visualized portions within the abdomen are unremarkable. Vascular/Lymphatic: No pathologically enlarged lymph nodes identified. No abdominal aortic aneurysm demonstrated. Other: Very extensive inflammatory fat stranding throughout the visualized retroperitoneum. Small volume ascites throughout the abdomen. Anasarca. Musculoskeletal: No suspicious bone lesions identified. IMPRESSION: 1. Acute pancreatitis. No evidence of acute pancreatic fluid collection or parenchymal hypoenhancement to suggest necrosis. There is very extensive inflammatory fat stranding about the entirety of the included retroperitoneum. 2. There is pancreatic divisum and the pancreatic duct is diffusely dilated to the papillae, measuring up to 7 mm. No pancreatic mass or other parenchymal abnormality identified. 3. The gallbladder is distended, however there are no gallstones identified. No biliary ductal dilatation. 4. Pleural effusions, ascites, and anasarca. Electronically Signed   By: Eddie Candle M.D.   On: 11/19/2020 07:46   US Abdomen Limited RUQ (LIVER/GB)  Result Date: 11/17/2020 CLINICAL DATA:  Pancreatitis EXAM: ULTRASOUND ABDOMEN LIMITED RIGHT UPPER QUADRANT COMPARISON:  CT from same day FINDINGS: Gallbladder: No gallstones or wall thickening visualized. No sonographic Murphy sign noted by sonographer. Common bile  duct: Diameter: 7.3 cm Liver: No focal lesion identified. Within normal limits in parenchymal echogenicity. Portal vein is patent on color Doppler imaging with normal direction of blood flow towards the liver. Other: There is a trace amount of free fluid in the upper abdomen. The partially visualized pancreatic duct appears to be dilated. IMPRESSION: 1. No evidence for cholelithiasis. 2. Dilatation of the pancreatic duct and common bile duct raises concern for an obstructing process such as choledocholithiasis. Follow-up with MRCP/ERCP is recommended. 3. Trace free fluid in the upper abdomen. Electronically Signed   By: Constance Holster M.D.   On: 11/17/2020 06:57    Lab Data:  CBC: Recent Labs  Lab 11/17/20 0130 11/17/20 0143 11/17/20 1238 11/19/20 0233  WBC 9.9  --  7.4 8.1  NEUTROABS 7.9*  --   --   --   HGB 13.1 13.3 10.8* 10.3*  HCT 39.6 39.0 32.1* 32.1*  MCV 94.7  --  95.5 97.3  PLT 247  --  191  740   Basic Metabolic Panel: Recent Labs  Lab 11/17/20 0130 11/17/20 0143 11/17/20 1238 11/18/20 0201 11/19/20 0233  NA 136 136  --  136 136  K 3.4* 3.5  --  3.4* 3.6  CL 96* 97*  --  105 105  CO2 27  --   --  25 17*  GLUCOSE 148* 144*  --  92 55*  BUN 14 16  --  8 14  CREATININE 0.87 0.80 0.70 0.83 0.83  CALCIUM 9.4  --   --  7.7* 7.9*   GFR: Estimated Creatinine Clearance: 40.8 mL/min (by C-G formula based on SCr of 0.83 mg/dL). Liver Function Tests: Recent Labs  Lab 11/17/20 0130 11/18/20 0201 11/19/20 0233  AST 25 17 24   ALT 14 11 12   ALKPHOS 58 34* 42  BILITOT 0.8 0.7 1.4*  PROT 6.8 4.8* 4.8*  ALBUMIN 3.7 2.4* 2.4*   Recent Labs  Lab 11/17/20 0130 11/19/20 0657  LIPASE 8,117* 145*   No results for input(s): AMMONIA in the last 168 hours. Coagulation Profile: No results for input(s): INR, PROTIME in the last 168 hours. Cardiac Enzymes: No results for input(s): CKTOTAL, CKMB, CKMBINDEX, TROPONINI in the last 168 hours. BNP (last 3 results) No results  for input(s): PROBNP in the last 8760 hours. HbA1C: No results for input(s): HGBA1C in the last 72 hours. CBG: No results for input(s): GLUCAP in the last 168 hours. Lipid Profile: Recent Labs    11/17/20 0525  TRIG 104   Thyroid Function Tests: No results for input(s): TSH, T4TOTAL, FREET4, T3FREE, THYROIDAB in the last 72 hours. Anemia Panel: No results for input(s): VITAMINB12, FOLATE, FERRITIN, TIBC, IRON, RETICCTPCT in the last 72 hours. Urine analysis:    Component Value Date/Time   COLORURINE YELLOW 11/17/2020 0700   APPEARANCEUR CLEAR 11/17/2020 0700   LABSPEC 1.027 11/17/2020 0700   PHURINE 7.0 11/17/2020 0700   GLUCOSEU NEGATIVE 11/17/2020 0700   HGBUR NEGATIVE 11/17/2020 0700   BILIRUBINUR NEGATIVE 11/17/2020 0700   KETONESUR NEGATIVE 11/17/2020 0700   PROTEINUR NEGATIVE 11/17/2020 0700   NITRITE NEGATIVE 11/17/2020 0700   LEUKOCYTESUR NEGATIVE 11/17/2020 0700     Khylie Larmore M.D. [facility] 11/19/2020, 2:17 PM   Call night coverage person covering after 7pm

## 2020-11-20 ENCOUNTER — Inpatient Hospital Stay (HOSPITAL_COMMUNITY): Payer: Medicare Other

## 2020-11-20 DIAGNOSIS — J9601 Acute respiratory failure with hypoxia: Secondary | ICD-10-CM

## 2020-11-20 LAB — CBC
HCT: 28.9 % — ABNORMAL LOW (ref 36.0–46.0)
Hemoglobin: 10.1 g/dL — ABNORMAL LOW (ref 12.0–15.0)
MCH: 32.5 pg (ref 26.0–34.0)
MCHC: 34.9 g/dL (ref 30.0–36.0)
MCV: 92.9 fL (ref 80.0–100.0)
Platelets: 193 10*3/uL (ref 150–400)
RBC: 3.11 MIL/uL — ABNORMAL LOW (ref 3.87–5.11)
RDW: 12.3 % (ref 11.5–15.5)
WBC: 7.1 10*3/uL (ref 4.0–10.5)
nRBC: 0 % (ref 0.0–0.2)

## 2020-11-20 LAB — COMPREHENSIVE METABOLIC PANEL
ALT: 10 U/L (ref 0–44)
AST: 21 U/L (ref 15–41)
Albumin: 2.2 g/dL — ABNORMAL LOW (ref 3.5–5.0)
Alkaline Phosphatase: 39 U/L (ref 38–126)
Anion gap: 8 (ref 5–15)
BUN: 7 mg/dL — ABNORMAL LOW (ref 8–23)
CO2: 21 mmol/L — ABNORMAL LOW (ref 22–32)
Calcium: 7.5 mg/dL — ABNORMAL LOW (ref 8.9–10.3)
Chloride: 107 mmol/L (ref 98–111)
Creatinine, Ser: 0.67 mg/dL (ref 0.44–1.00)
GFR, Estimated: >60 mL/min (ref >60)
Glucose, Bld: 102 mg/dL — ABNORMAL HIGH (ref 70–99)
Potassium: 3.3 mmol/L — ABNORMAL LOW (ref 3.5–5.1)
Sodium: 136 mmol/L (ref 135–145)
Total Bilirubin: 0.7 mg/dL (ref 0.3–1.2)
Total Protein: 4.6 g/dL — ABNORMAL LOW (ref 6.5–8.1)

## 2020-11-20 LAB — BRAIN NATRIURETIC PEPTIDE: B Natriuretic Peptide: 452.8 pg/mL — ABNORMAL HIGH (ref 0.0–100.0)

## 2020-11-20 LAB — LIPASE, BLOOD: Lipase: 32 U/L (ref 11–51)

## 2020-11-20 MED ORDER — FUROSEMIDE 10 MG/ML IJ SOLN
20.0000 mg | Freq: Once | INTRAMUSCULAR | Status: AC
  Administered 2020-11-20: 20 mg via INTRAVENOUS
  Filled 2020-11-20: qty 2

## 2020-11-20 MED ORDER — PANTOPRAZOLE SODIUM 40 MG PO TBEC
40.0000 mg | DELAYED_RELEASE_TABLET | Freq: Every day | ORAL | Status: DC
  Administered 2020-11-21: 40 mg via ORAL
  Filled 2020-11-20: qty 1

## 2020-11-20 MED ORDER — UMECLIDINIUM BROMIDE 62.5 MCG/INH IN AEPB
1.0000 | INHALATION_SPRAY | Freq: Every day | RESPIRATORY_TRACT | Status: DC
  Administered 2020-11-21: 1 via RESPIRATORY_TRACT
  Filled 2020-11-20: qty 7

## 2020-11-20 MED ORDER — ALBUTEROL SULFATE (2.5 MG/3ML) 0.083% IN NEBU
2.5000 mg | INHALATION_SOLUTION | Freq: Three times a day (TID) | RESPIRATORY_TRACT | Status: DC
  Administered 2020-11-20: 2.5 mg via RESPIRATORY_TRACT
  Filled 2020-11-20 (×2): qty 3

## 2020-11-20 MED ORDER — AMOXICILLIN-POT CLAVULANATE 875-125 MG PO TABS
1.0000 | ORAL_TABLET | Freq: Two times a day (BID) | ORAL | Status: DC
  Administered 2020-11-20 – 2020-11-21 (×3): 1 via ORAL
  Filled 2020-11-20 (×3): qty 1

## 2020-11-20 MED ORDER — ACETAMINOPHEN 325 MG PO TABS
650.0000 mg | ORAL_TABLET | ORAL | Status: DC | PRN
  Administered 2020-11-20 (×2): 650 mg via ORAL
  Filled 2020-11-20 (×3): qty 2

## 2020-11-20 MED ORDER — IPRATROPIUM-ALBUTEROL 0.5-2.5 (3) MG/3ML IN SOLN
3.0000 mL | Freq: Once | RESPIRATORY_TRACT | Status: AC
  Administered 2020-11-20: 3 mL via RESPIRATORY_TRACT
  Filled 2020-11-20: qty 3

## 2020-11-20 MED ORDER — POTASSIUM CHLORIDE CRYS ER 20 MEQ PO TBCR
20.0000 meq | EXTENDED_RELEASE_TABLET | Freq: Once | ORAL | Status: AC
  Administered 2020-11-20: 20 meq via ORAL
  Filled 2020-11-20: qty 1

## 2020-11-20 MED ORDER — ALBUTEROL SULFATE (2.5 MG/3ML) 0.083% IN NEBU
2.5000 mg | INHALATION_SOLUTION | Freq: Four times a day (QID) | RESPIRATORY_TRACT | Status: DC
  Administered 2020-11-20: 2.5 mg via RESPIRATORY_TRACT
  Filled 2020-11-20: qty 3

## 2020-11-20 MED ORDER — FUROSEMIDE 20 MG PO TABS
20.0000 mg | ORAL_TABLET | Freq: Every day | ORAL | Status: DC
  Administered 2020-11-20: 20 mg via ORAL
  Filled 2020-11-20: qty 1

## 2020-11-20 NOTE — Progress Notes (Addendum)
Patient's abdominal pain is better, but she feels a little bit "wobbly."  Physical therapy evaluation reviewed.  Pain was adequately controlled yesterday by use of Vicodin (none so far today).  Hasn't had IV pain meds for 48 hours.  Patient ate a small amount of solid food for breakfast.  She did not eat much because she did not care for the food.  Has had 1 "hard" bowel movement since admission, does not feel need for additional laxative at the moment.  Lipase level now normal, white count remains normal.  Impression: Rapidly resolving acute pancreatitis of unclear cause (triglycerides normal, no gallstones or choledocholithiasis, no significant alcohol, no pancreatic neoplasm).  Probably related to pancreas to view some anatomy which does increase a person's risk of getting pancreatitis.  Could also be postop pancreatitis.    Recommendations:  1.  Okay from GI standpoint for patient to be discharged anytime.  However, realistically, she does not quite feel ready to go home, and at least wants to see how she does with lunch before deciding.  2.  Following discharge, no specific GI medications or GI follow-up needed.  I would recommend follow-up with her primary physician within a couple of weeks postdischarge to monitor her condition, and I gave the patient my card and encouraged her to have her PCP call me if there are any concerns or questions.   3.  I did recommend she start with frequent small, low-fat meals and gradually advance to her normal diet as tolerated.  Please call if you have any questions or would like to discuss her case.  Cleotis Nipper, M.D. Pager [phone] If no answer or after 5 PM call [phone]

## 2020-11-20 NOTE — Progress Notes (Signed)
SATURATION QUALIFICATIONS: (This note is used to comply with regulatory documentation for home oxygen)  Patient Saturations on Room Air at Rest = 93-94%  Patient Saturations on Room Air while Ambulating = 93-94%  Please briefly explain why patient needs home oxygen:

## 2020-11-20 NOTE — Care Management Important Message (Signed)
Important Message  Patient Details  Name: Elizabeth [name] MRN: [id number] Date of Birth: 06-18-1941   Medicare Important Message Given:  Yes  Im sent    [name] 11/20/2020, 2:12 PM

## 2020-11-20 NOTE — Progress Notes (Signed)
PHARMACIST - PHYSICIAN COMMUNICATION DR:   [place] CONCERNING: Protonix IV to Oral Route Change Policy  RECOMMENDATION: This patient is receiving Protonix by the intravenous route.  Based on criteria approved by the Pharmacy and Therapeutics Committee, this drug is being converted to the equivalent oral dose form(s).  DESCRIPTION: These criteria include:  The patient is eating (either orally or via tube) and/or has been taking other orally administered medications for a least 24 hours  There is no active GI bleed or impaired GI absorption noted.   If you have questions about this conversion, please contact the Pharmacy Department  []   [phone] )  [facility] [x]   [phone] )  [facility]  []   [phone] )  [facility] []   [phone] )  [place], PharmD, BCPS 11:54 AM

## 2020-11-20 NOTE — Progress Notes (Addendum)
[facility]                                                                              Patient Demographics  Elizabeth [name], is a 80 y.o. female, DOB - 1941-10-23, [id number]  Admit date - 11/17/2020   Admitting Physician Cristal Deer, MD  Outpatient Primary MD for the patient is [name], MD  Outpatient specialists:   LOS - 3  days   Medical records reviewed and are as summarized below:    Chief Complaint  Patient presents with  . Back Pain  . Abdominal Pain       Brief summary   Elizabeth [name] a 80 y.o.femalewith medical history significant fortobacco dependence, mild depression, renal calculi, COPD GERD andrecent laminectomy discharge November 12, 2020 gradual onset of abdominal pain which she describes as located on the left side with burning worse with movement and associated with nausea and vomiting.Found to have acute pancreatitis and admitted. Also, notes constipation.  1/18: Acute pancreatitis improved.  However somewhat short of breath, weak, wheezing this morning.  Placed on nebs, Lasix, PT.  Possible DC home in a.m. if improving.  Assessment & Plan      Acute pancreatitis -CT abdomen showedacute pancreatitis with pancreatic and biliary ductal enlargement, no etiology of ductal obstruction, pancreatic necrosis or drainable fluid collection  - RUQ u/s reveals possible obstruction like choledocolithiasis -Patient was placed on IV fluids, n.p.o. status and pain control -GI was consulted, MRCP showed acute pancreatitis, no necrosis, very extensive inflammatory fat stranding about the entirety of the retroperitoneal.  Pancreatic divisum and pancreatic duct is diffusely dilated to the papula measuring up to 7 mm.  No pancreatic mass, gallbladder is distended but no gallstones or biliary ductal dilatation. -Per GI, continue supportive care, no further testing evaluation needed at this time -Lipase improved from 8117-> 145-> 32  today -Triglycerides 104 -Advance to low-fat diet this morning  Shortness of breath with mild acute respiratory failure with hypoxia, wheezing, mild COPD exacerbation, generalized debility -At the time of my examination this a.m. on O2 2 L, states her O2 sats had dropped down to 88% and was placed on oxygen -Stat BNP 452.8, IV fluids were discontinued, given 1 dose of Lasix 20 mg IV x1 and placed on low-dose Lasix 20 mg daily for 2 doses.  I's and O's with 5.0 L+ -chest x-ray showed interstitial prominences and patchy basilar opacities-> given low grade temp and new hypoxia, added augmentin  -Placed on albuterol nebs, Incruse Ellipta daily -PT OT evaluation  Constipation Continue Colace, add MiraLAX  H/o Diverticulitis -Currently no acute diverticulitis  GERD Continue Protonix  Neurogenic Claudication  - Recent laminectomy  - continue pain management with IV Dilaudid   Hypertension -BP currently stable On Norvasc, Catapres, Toprol, Avalide -Added IV hydralazine as needed with parameters  Hypercholesterolemia Continue Zocor  Tobacco abuse  -Continue nicotine patch    Code Status full CODE STATUS DVT Prophylaxis:  Lovenox Family Communication: Discussed all imaging results, lab results, explained to the patient. Called patient's son, Antoinnette Hollomon and updated in detail (phone # [phone]) today  Disposition Plan:  Status is: Inpatient  Remains inpatient appropriate because:Inpatient level of care appropriate due to severity of illness   Dispo: The patient is from: Home              Anticipated d/c is to: Home              Anticipated d/c date is: 2 days              Patient currently is not medically stable to d/c.  Feeling very weak, wheezing, somewhat short of breath at the time of my examination.  Likely DC home in a.m. if continues to improve     Time Spent in minutes   35 minutes  Procedures:  MRCP  Consultants:    Gastroenterology  Antimicrobials:   Anti-infectives (From admission, onward)   None         Medications  Scheduled Meds: . albuterol  2.5 mg Nebulization Q6H  . amLODipine  5 mg Oral Daily  . aspirin EC  81 mg Oral QPM  . cloNIDine  0.1 mg Oral QHS  . docusate sodium  100 mg Oral BID  . enoxaparin (LOVENOX) injection  40 mg Subcutaneous Q24H  . furosemide  20 mg Oral Daily  . gabapentin  200 mg Oral QHS  . irbesartan  150 mg Oral Daily  . metoprolol succinate  25 mg Oral QPM  . nicotine  14 mg Transdermal Daily  . [START ON 11/21/2020] pantoprazole  40 mg Oral Q0600  . polyethylene glycol  17 g Oral Daily  . simvastatin  40 mg Oral QPM  . umeclidinium bromide  1 puff Inhalation Daily   Continuous Infusions:  PRN Meds:.acetaminophen, HYDROcodone-acetaminophen, HYDROmorphone (DILAUDID) injection, ondansetron (ZOFRAN) IV      Subjective:   Elizabeth [name] was seen and examined today.  Diet advanced this morning, not feeling well today.  Feels weak, has some shortness of breath this morning and wheezing.  Low-grade temp of 100 F.  Abdominal pain is improving.  Had 1 small BM.    Objective:   Vitals:   11/20/20 0554 11/20/20 0556 11/20/20 0823 11/20/20 1144  BP: (!) 146/51     Pulse: 90     Resp: 18     Temp: 99.2 F (37.3 C)   100 F (37.8 C)  TempSrc: Oral   Oral  SpO2: (!) 88% 95% 94%     Intake/Output Summary (Last 24 hours) at 11/20/2020 1155 Last data filed at 11/20/2020 1105 Gross per 24 hour  Intake 2545 ml  Output 3500 ml  Net -955 ml     Wt Readings from Last 3 Encounters:  11/12/20 50 kg  11/08/20 50 kg  04/11/20 47.2 kg   Physical Exam  General: Alert and oriented x 3, NAD  Cardiovascular: S1 S2 clear, RRR. No pedal edema b/l  Respiratory: Decreased breath sounds at the bases with scattered wheezing  Gastrointestinal: Soft, nontender, nondistended, NBS  Ext: no pedal edema bilaterally  Neuro: no new deficits  Musculoskeletal: No  cyanosis, clubbing  Skin: No rashes  Psych: Normal affect and demeanor, alert and oriented x3    Data Reviewed:  I have personally reviewed following labs and imaging studies  Micro Results Recent Results (from the past 240 hour(s))  SARS CORONAVIRUS 2 (TAT 6-24 HRS) Nasopharyngeal Nasopharyngeal Swab     Status: None   Collection Time: 11/17/20  5:33 AM   Specimen: Nasopharyngeal Swab  Result Value Ref Range Status   SARS Coronavirus 2 NEGATIVE  NEGATIVE Final    Comment: (NOTE) SARS-CoV-2 target nucleic acids are NOT DETECTED.  The SARS-CoV-2 RNA is generally detectable in upper and lower respiratory specimens during the acute phase of infection. Negative results do not preclude SARS-CoV-2 infection, do not rule out co-infections with other pathogens, and should not be used as the sole basis for treatment or other patient management decisions. Negative results must be combined with clinical observations, patient history, and epidemiological information. The expected result is Negative.  Fact Sheet for Patients: SugarRoll.be  Fact Sheet for Healthcare Providers: https://www.woods-mathews.com/  This test is not yet approved or cleared by the [place] FDA and  has been authorized for detection and/or diagnosis of SARS-CoV-2 by FDA under an Emergency Use Authorization (EUA). This EUA will remain  in effect (meaning this test can be used) for the duration of the COVID-19 declaration under Se ction 564(b)(1) of the Act, 21 U.S.C. section 360bbb-3(b)(1), unless the authorization is terminated or revoked sooner.  Performed at [facility], [place] [address] [place], [place] [address]     Radiology Reports CT ABDOMEN PELVIS W CONTRAST  Result Date: 11/17/2020 CLINICAL DATA:  37-year-old female with back pain following spine surgery five days ago. Constipation. EXAM: CT ABDOMEN AND PELVIS WITH CONTRAST TECHNIQUE: Multidetector  CT imaging of the abdomen and pelvis was performed using the standard protocol following bolus administration of intravenous contrast. CONTRAST:  22mL OMNIPAQUE IOHEXOL 300 MG/ML  SOLN COMPARISON:  CT Abdomen and Pelvis 04/11/2020. FINDINGS: Lower chest: Chronic bulky soft plaque or thrombus in the descending thoracic aorta is stable since June (series 3, image 4). Calcified aortic atherosclerosis. No cardiomegaly or pericardial effusion. Both lung bases are positive for dependent ground-glass and confluent opacity slightly greater on the right. Favor atelectasis. No pleural effusion. Hepatobiliary: Small volume perihepatic free fluid and new intrahepatic biliary ductal dilatation and periportal edema. Liver parenchymal enhancement is stable and within normal limits. Gallbladder remains within normal limits. Pancreas: Heterogeneously enhancing with confluent surrounding soft tissue edema and/or small volume free fluid. Dilated main pancreatic duct is new since June (series 3, image 25) but there is no obstructing mass or etiology identified. The duct tapers in the distal pancreas. No pancreatic necrosis identified. Spleen: Negative aside from adjacent soft tissue edema or trace free fluid. Adrenals/Urinary Tract: Adrenal glands remain within normal limits. Bilateral kidneys are stable. Renal enhancement and contrast excretion is symmetric and within normal limits. Stomach/Bowel: Oral contrast administered and has reached the distal small bowel but not yet the colon. The stomach is distended. But otherwise there is no abnormally dilated large or small bowel. Retained stool throughout the colon with intermittent diverticulosis (most pronounced in the right colon). No convincing small or large bowel inflammation. No free air. Vascular/Lymphatic: Severe Aortoiliac calcified atherosclerosis. Major arterial structures in the abdomen and pelvis remain patent. There is a developing infrarenal abdominal aortic aneurysm with  mural plaque or thrombus measuring approximately 23 mm diameter, stable since June. There is also chronic severe stenosis at the aortoiliac bifurcation, but the major arterial structures remain patent. Portal venous system is patent.  Splenic vein remains patent. No lymphadenopathy. Reproductive: Stable since June, within normal limits aside from parametrial venous enlargement (series 3, image 66). Other: No pelvic free fluid. Musculoskeletal: Postoperative changes to the posterior elements at L3-L4 and L4-L5. Small volume residual postoperative soft tissue gas along the left erector spinae muscles. No unexpected osseous changes identified. Chronic left femur ORIF. IMPRESSION: 1. Positive for Acute Pancreatitis, with pancreatic  and biliary ductal enlargement. No etiology of ductal obstruction is identified by CT. 2. Associated peripancreatic and periportal edema with small volume free fluid in the abdomen and pelvis. No pancreatic necrosis, organized or drainable fluid collection. 3. 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 But no major arterial occlusion is identified. - small but developing infrarenal abdominal aortic aneurysm with mural plaque or thrombus, 23 mm diameter and stable since June. - additional chronic bulky soft plaque or thrombus in the descending thoracic aorta, stable since June. 4. Recent posterior decompression of the lumbar spine with no adverse features. Electronically Signed   By: Genevie Ann M.D.   On: 11/17/2020 04:52   DG Lumbar Spine 1 View  Result Date: 11/12/2020 CLINICAL DATA:  Intraop localization EXAM: LUMBAR SPINE - 1 VIEW COMPARISON:  CT abdomen/pelvis dated 04/11/2020 FINDINGS: Single lateral view of the lumbar spine with a surgical probe at the L4-5 level. IMPRESSION: Intraoperative localization as above. Electronically Signed   By: Julian Hy M.D.   On: 11/12/2020 10:32   DG CHEST PORT  1 VIEW  Result Date: 11/20/2020 CLINICAL DATA:  Acute respiratory failure with hypoxia. EXAM: PORTABLE CHEST 1 VIEW COMPARISON:  07/17/2016 and prior. FINDINGS: No pneumothorax or pleural effusion. Diffuse interstitial prominence with patchy bibasilar opacities. Cardiomediastinal silhouette within normal limits. No acute osseous abnormality. IMPRESSION: Interstitial prominence and patchy basilar opacities. Differential includes infection versus chronic sequela with atelectasis. Electronically Signed   By: Primitivo Gauze M.D.   On: 11/20/2020 08:19   MR ABDOMEN MRCP W WO CONTAST  Result Date: 11/19/2020 CLINICAL DATA:  Pancreatitis suspected EXAM: MRI ABDOMEN WITHOUT AND WITH CONTRAST (INCLUDING MRCP) TECHNIQUE: Multiplanar multisequence MR imaging of the abdomen was performed both before and after the administration of intravenous contrast. Heavily T2-weighted images of the biliary and pancreatic ducts were obtained, and three-dimensional MRCP images were rendered by post processing. CONTRAST:  48mL GADAVIST GADOBUTROL 1 MMOL/ML IV SOLN COMPARISON:  CT abdomen pelvis, abdominal ultrasound, 11/17/2020 FINDINGS: Lower chest: Small bilateral pleural effusions and associated atelectasis or consolidation. Hepatobiliary: No mass or other parenchymal abnormality identified. The gallbladder is distended, however there are no gallstones identified. No biliary ductal dilatation. The common bile duct measures up to 6 mm in caliber and is patent to the ampulla without calculus or other filling defect. Pancreas: There is diffuse inflammatory fat stranding about the pancreas and adjacent retroperitoneum. There is no evidence of acute pancreatic fluid collection or parenchymal hypoenhancement to suggest necrosis. There is pancreatic divisum. The pancreatic duct is diffusely dilated to the papillae, measuring up to 7 mm. No mass or other parenchymal abnormality identified. Spleen:  Within normal limits in size and  appearance. Adrenals/Urinary Tract: No masses identified. No evidence of hydronephrosis. Stomach/Bowel: Visualized portions within the abdomen are unremarkable. Vascular/Lymphatic: No pathologically enlarged lymph nodes identified. No abdominal aortic aneurysm demonstrated. Other: Very extensive inflammatory fat stranding throughout the visualized retroperitoneum. Small volume ascites throughout the abdomen. Anasarca. Musculoskeletal: No suspicious bone lesions identified. IMPRESSION: 1. Acute pancreatitis. No evidence of acute pancreatic fluid collection or parenchymal hypoenhancement to suggest necrosis. There is very extensive inflammatory fat stranding about the entirety of the included retroperitoneum. 2. There is pancreatic divisum and the pancreatic duct is diffusely dilated to the papillae, measuring up to 7 mm. No pancreatic mass or other parenchymal abnormality identified. 3. The gallbladder is distended, however there are no gallstones identified. No biliary ductal dilatation. 4. Pleural effusions, ascites, and  anasarca. Electronically Signed   By: Eddie Candle M.D.   On: 11/19/2020 07:46   US Abdomen Limited RUQ (LIVER/GB)  Result Date: 11/17/2020 CLINICAL DATA:  Pancreatitis EXAM: ULTRASOUND ABDOMEN LIMITED RIGHT UPPER QUADRANT COMPARISON:  CT from same day FINDINGS: Gallbladder: No gallstones or wall thickening visualized. No sonographic Murphy sign noted by sonographer. Common bile duct: Diameter: 7.3 cm Liver: No focal lesion identified. Within normal limits in parenchymal echogenicity. Portal vein is patent on color Doppler imaging with normal direction of blood flow towards the liver. Other: There is a trace amount of free fluid in the upper abdomen. The partially visualized pancreatic duct appears to be dilated. IMPRESSION: 1. No evidence for cholelithiasis. 2. Dilatation of the pancreatic duct and common bile duct raises concern for an obstructing process such as choledocholithiasis.  Follow-up with MRCP/ERCP is recommended. 3. Trace free fluid in the upper abdomen. Electronically Signed   By: Constance Holster M.D.   On: 11/17/2020 06:57    Lab Data:  CBC: Recent Labs  Lab 11/17/20 0130 11/17/20 0143 11/17/20 1238 11/19/20 0233 11/20/20 0150  WBC 9.9  --  7.4 8.1 7.1  NEUTROABS 7.9*  --   --   --   --   HGB 13.1 13.3 10.8* 10.3* 10.1*  HCT 39.6 39.0 32.1* 32.1* 28.9*  MCV 94.7  --  95.5 97.3 92.9  PLT 247  --  191 177 0000000   Basic Metabolic Panel: Recent Labs  Lab 11/17/20 0130 11/17/20 0143 11/17/20 1238 11/18/20 0201 11/19/20 0233 11/20/20 0150  NA 136 136  --  136 136 136  K 3.4* 3.5  --  3.4* 3.6 3.3*  CL 96* 97*  --  105 105 107  CO2 27  --   --  25 17* 21*  GLUCOSE 148* 144*  --  92 55* 102*  BUN 14 16  --  8 14 7*  CREATININE 0.87 0.80 0.70 0.83 0.83 0.67  CALCIUM 9.4  --   --  7.7* 7.9* 7.5*   GFR: Estimated Creatinine Clearance: 42.3 mL/min (by C-G formula based on SCr of 0.67 mg/dL). Liver Function Tests: Recent Labs  Lab 11/17/20 0130 11/18/20 0201 11/19/20 0233 11/20/20 0150  AST 25 17 24 21   ALT 14 11 12 10   ALKPHOS 58 34* 42 39  BILITOT 0.8 0.7 1.4* 0.7  PROT 6.8 4.8* 4.8* 4.6*  ALBUMIN 3.7 2.4* 2.4* 2.2*   Recent Labs  Lab 11/17/20 0130 11/19/20 0657 11/20/20 0150  LIPASE 8,117* 145* 32   No results for input(s): AMMONIA in the last 168 hours. Coagulation Profile: No results for input(s): INR, PROTIME in the last 168 hours. Cardiac Enzymes: No results for input(s): CKTOTAL, CKMB, CKMBINDEX, TROPONINI in the last 168 hours. BNP (last 3 results) No results for input(s): PROBNP in the last 8760 hours. HbA1C: No results for input(s): HGBA1C in the last 72 hours. CBG: No results for input(s): GLUCAP in the last 168 hours. Lipid Profile: No results for input(s): CHOL, HDL, LDLCALC, TRIG, CHOLHDL, LDLDIRECT in the last 72 hours. Thyroid Function Tests: No results for input(s): TSH, T4TOTAL, FREET4, T3FREE,  THYROIDAB in the last 72 hours. Anemia Panel: No results for input(s): VITAMINB12, FOLATE, FERRITIN, TIBC, IRON, RETICCTPCT in the last 72 hours. Urine analysis:    Component Value Date/Time   COLORURINE YELLOW 11/17/2020 0700   APPEARANCEUR CLEAR 11/17/2020 0700   LABSPEC 1.027 11/17/2020 0700   PHURINE 7.0 11/17/2020 0700   GLUCOSEU NEGATIVE 11/17/2020 0700  HGBUR NEGATIVE 11/17/2020 0700   BILIRUBINUR NEGATIVE 11/17/2020 0700   KETONESUR NEGATIVE 11/17/2020 0700   PROTEINUR NEGATIVE 11/17/2020 0700   NITRITE NEGATIVE 11/17/2020 0700   LEUKOCYTESUR NEGATIVE 11/17/2020 0700     Brekyn Huntoon M.D. [facility] 11/20/2020, 11:55 AM   Call night coverage person covering after 7pm

## 2020-11-20 NOTE — Evaluation (Signed)
Physical Therapy Evaluation Patient Details Name: Elizabeth [name] MRN: [id number] DOB: 02-09-1941 Today's Date: 11/20/2020   History of Present Illness  Pt is an 80 y/o female admitted secondary to abdominal pain. Found to have acute pancreatitis. Pt also with lumbar laminectomy on 11/12/20. PMH includes CAD, COPD, HTN, tobacco use.  Clinical Impression  Pt admitted secondary to problem above with deficits below. Pt requiring min guard A for short distance ambulation this session. Fatiguing easily; oxygen sats at 93-94% on RA throughout. Educated about activity pacing and gave HEP that included supine and standing exercises. Pt has assist from granddaughter at d/c. Will continue to follow acutely.     Follow Up Recommendations No PT follow up (gave HEP)    Equipment Recommendations  None recommended by PT    Recommendations for Other Services       Precautions / Restrictions Precautions Precautions: Fall;Back Precaution Booklet Issued: No Precaution Comments: Recent lumbar sx Restrictions Weight Bearing Restrictions: No      Mobility  Bed Mobility Overal bed mobility: Modified Independent                  Transfers Overall transfer level: Needs assistance Equipment used: None Transfers: Sit to/from Stand Sit to Stand: Min guard         General transfer comment: Min guard for safety.  Ambulation/Gait Ambulation/Gait assistance: Min guard Gait Distance (Feet): 30 Feet Assistive device: None Gait Pattern/deviations: Step-through pattern Gait velocity: Decreased   General Gait Details: Mild instability noted and pt with decreased activity tolerance. Min guard for safety. Oxygen sats at 93-94% on RA throughout  Stairs            Wheelchair Mobility    Modified Rankin (Stroke Patients Only)       Balance Overall balance assessment: Mild deficits observed, not formally tested                                           Pertinent  Vitals/Pain Pain Assessment: Faces Faces Pain Scale: Hurts little more Pain Location: abdomen Pain Descriptors / Indicators: Aching Pain Intervention(s): Monitored during session;Limited activity within patient's tolerance;Repositioned    Home Living Family/patient expects to be discharged to:: Private residence Living Arrangements: Other relatives (granddaughter) Available Help at Discharge: Family;Available 24 hours/day Type of Home: House Home Access: Level entry     Home Layout: One level Home Equipment: Walker - 2 wheels;Cane - single point;Shower seat - built in;Wheelchair - manual      Prior Function Level of Independence: Independent               Hand Dominance        Extremity/Trunk Assessment   Upper Extremity Assessment Upper Extremity Assessment: Overall WFL for tasks assessed    Lower Extremity Assessment Lower Extremity Assessment: Generalized weakness    Cervical / Trunk Assessment Cervical / Trunk Assessment: Other exceptions Cervical / Trunk Exceptions: recent lumbar surgery  Communication   Communication: No difficulties  Cognition Arousal/Alertness: Awake/alert Behavior During Therapy: WFL for tasks assessed/performed Overall Cognitive Status: Within Functional Limits for tasks assessed                                        General Comments General comments (skin integrity, edema, etc.): Gave HEP handout  and verbally reviewed with pt.    Exercises     Assessment/Plan    PT Assessment Patient needs continued PT services  PT Problem List Decreased strength;Decreased balance;Decreased activity tolerance;Decreased mobility       PT Treatment Interventions DME instruction;Gait training;Therapeutic activities;Functional mobility training;Therapeutic exercise;Balance training;Neuromuscular re-education;Patient/family education    PT Goals (Current goals can be found in the Care Plan section)  Acute Rehab PT  Goals Patient Stated Goal: to feel better PT Goal Formulation: With patient Time For Goal Achievement: 12/04/20 Potential to Achieve Goals: Good    Frequency Min 3X/week   Barriers to discharge        Co-evaluation               AM-PAC PT "6 Clicks" Mobility  Outcome Measure Help needed turning from your back to your side while in a flat bed without using bedrails?: None Help needed moving from lying on your back to sitting on the side of a flat bed without using bedrails?: None Help needed moving to and from a bed to a chair (including a wheelchair)?: A Little Help needed standing up from a chair using your arms (e.g., wheelchair or bedside chair)?: A Little Help needed to walk in hospital room?: A Little Help needed climbing 3-5 steps with a railing? : A Little 6 Click Score: 20    End of Session   Activity Tolerance: Patient tolerated treatment well Patient left: in bed;with call bell/phone within reach Nurse Communication: Mobility status PT Visit Diagnosis: Muscle weakness (generalized) (M62.81)    Time: 0277-4128 PT Time Calculation (min) (ACUTE ONLY): 25 min   Charges:   PT Evaluation $PT Eval Low Complexity: 1 Low PT Treatments $Gait Training: 8-22 mins        Lou Miner, DPT  Acute Rehabilitation Services  Pager: [phone] Office: [phone]   Rudean Hitt 11/20/2020, 9:55 AM

## 2020-11-20 NOTE — Progress Notes (Signed)
SATURATION QUALIFICATIONS: (This note is used to comply with regulatory documentation for home oxygen)  Patient Saturations on Room Air at Rest = 94%  Patient Saturations on Room Air while Ambulating = 93%    Please briefly explain why patient needs home oxygen: Pt did not require supplemental oxygen during ambulation to maintain adequate saturations.   Reuel Derby, PT, DPT  Acute Rehabilitation Services  Pager: [phone] Office: [phone]

## 2020-11-21 LAB — COMPREHENSIVE METABOLIC PANEL
ALT: 11 U/L (ref 0–44)
AST: 24 U/L (ref 15–41)
Albumin: 2.5 g/dL — ABNORMAL LOW (ref 3.5–5.0)
Alkaline Phosphatase: 43 U/L (ref 38–126)
Anion gap: 10 (ref 5–15)
BUN: <5 mg/dL — ABNORMAL LOW (ref 8–23)
CO2: 23 mmol/L (ref 22–32)
Calcium: 8 mg/dL — ABNORMAL LOW (ref 8.9–10.3)
Chloride: 103 mmol/L (ref 98–111)
Creatinine, Ser: 0.64 mg/dL (ref 0.44–1.00)
GFR, Estimated: >60 mL/min (ref >60)
Glucose, Bld: 114 mg/dL — ABNORMAL HIGH (ref 70–99)
Potassium: 2.9 mmol/L — ABNORMAL LOW (ref 3.5–5.1)
Sodium: 136 mmol/L (ref 135–145)
Total Bilirubin: 1 mg/dL (ref 0.3–1.2)
Total Protein: 5.2 g/dL — ABNORMAL LOW (ref 6.5–8.1)

## 2020-11-21 LAB — LIPASE, BLOOD: Lipase: 38 U/L (ref 11–51)

## 2020-11-21 LAB — ANTINUCLEAR ANTIBODIES, IFA: ANA Ab, IFA: NEGATIVE

## 2020-11-21 LAB — IGG 4: IgG, Subclass 4: 4 mg/dL (ref 2–96)

## 2020-11-21 LAB — IGG: IgG (Immunoglobin G), Serum: 560 mg/dL — ABNORMAL LOW (ref 586–1602)

## 2020-11-21 MED ORDER — ALBUTEROL SULFATE HFA 108 (90 BASE) MCG/ACT IN AERS: 2.0000 | INHALATION_SPRAY | Freq: Four times a day (QID) | RESPIRATORY_TRACT | 2 refills | Status: DC | PRN

## 2020-11-21 MED ORDER — ALBUTEROL SULFATE (2.5 MG/3ML) 0.083% IN NEBU: 2.5000 mg | INHALATION_SOLUTION | Freq: Four times a day (QID) | RESPIRATORY_TRACT | Status: DC | PRN

## 2020-11-21 MED ORDER — ONDANSETRON 4 MG PO TBDP: 4.0000 mg | ORAL_TABLET | Freq: Three times a day (TID) | ORAL | 0 refills | Status: DC | PRN

## 2020-11-21 MED ORDER — INCRUSE ELLIPTA 62.5 MCG/INH IN AEPB: 1.0000 | INHALATION_SPRAY | Freq: Every day | RESPIRATORY_TRACT | 1 refills | Status: AC

## 2020-11-21 MED ORDER — AMOXICILLIN-POT CLAVULANATE 875-125 MG PO TABS: 1.0000 | ORAL_TABLET | Freq: Two times a day (BID) | ORAL | 0 refills | Status: AC

## 2020-11-21 MED ORDER — POTASSIUM CHLORIDE CRYS ER 20 MEQ PO TBCR
40.0000 meq | EXTENDED_RELEASE_TABLET | ORAL | Status: DC
  Administered 2020-11-21: 40 meq via ORAL
  Filled 2020-11-21: qty 2

## 2020-11-21 NOTE — Discharge Summary (Addendum)
Physician Discharge Summary   Patient ID: Elizabeth [name] MRN: [id number] DOB/AGE: Jan 20, 1941 80 y.o.  Admit date: 11/17/2020 Discharge date: 11/21/2020  Primary Care Physician:  [name], MD   Recommendations for Outpatient Follow-up:  1. Follow up with PCP in 1-2 weeks  Home Health: None  Equipment/Devices:   Discharge Condition: stable  CODE STATUS: FULL  Diet recommendation: Low-fat diet   Discharge Diagnoses:    . Acute pancreatitis in the postop setting associated with pancreatic divisum Acute respiratory failure with hypoxia GERD . Spinal stenosis of lumbar region with neurogenic claudication . COPD (chronic obstructive pulmonary disease) exacerbation ([facility]) . Hypertension Tobacco use Neurogenic claudication status post recent laminectomy Constipation  Consults: Gastroenterology    Allergies:   Allergies  Allergen Reactions  . Other     Chlorexolone  . Pollen Extract     Allergic to tree pollens  . Tegretol [Carbamazepine]     Unknown   . Lisinopril Rash  . Tetracyclines & Related Rash     DISCHARGE MEDICATIONS: Allergies as of 11/21/2020      Reactions   Other    Chlorexolone   Pollen Extract    Allergic to tree pollens   Tegretol [carbamazepine]    Unknown    Lisinopril Rash   Tetracyclines & Related Rash      Medication List    STOP taking these medications   ibuprofen 200 MG tablet Commonly known as: ADVIL   meloxicam 15 MG tablet Commonly known as: MOBIC     TAKE these medications   albuterol 108 (90 Base) MCG/ACT inhaler Commonly known as: VENTOLIN HFA Inhale 2 puffs into the lungs every 6 (six) hours as needed for wheezing or shortness of breath.   amLODipine 5 MG tablet Commonly known as: NORVASC Take 5 mg by mouth daily.   amoxicillin-clavulanate 875-125 MG tablet Commonly known as: AUGMENTIN Take 1 tablet by mouth 2 (two) times daily for 5 days.   aspirin 81 MG tablet Take 81 mg by mouth every evening.    Besivance 0.6 % Susp Generic drug: Besifloxacin HCl Place 1 drop into both eyes in the morning and at bedtime.   cloNIDine 0.1 MG tablet Commonly known as: CATAPRES Take 0.1 mg by mouth at bedtime.   Coenzyme Q10 100 MG capsule Take 100 mg by mouth daily.   docusate sodium 100 MG capsule Commonly known as: COLACE Take 1 capsule (100 mg total) by mouth 2 (two) times daily. What changed:   when to take this  reasons to take this   famotidine 20 MG tablet Commonly known as: PEPCID Take 20 mg by mouth every other day.   gabapentin 100 MG capsule Commonly known as: NEURONTIN Take 2 capsules (200 mg total) by mouth at bedtime. PATIENT NEEDS OFFICE VISIT FOR ADDITIONAL REFILLS   HYDROcodone-acetaminophen 5-325 MG tablet Commonly known as: NORCO/VICODIN Take 1 tablet by mouth every 4 (four) hours as needed for moderate pain.   Incruse Ellipta 62.5 MCG/INH Aepb Generic drug: umeclidinium bromide Inhale 1 puff into the lungs daily. What changed:   when to take this  reasons to take this   irbesartan-hydrochlorothiazide 150-12.5 MG tablet Commonly known as: AVALIDE Take 1 tablet by mouth daily.   metoprolol succinate 25 MG 24 hr tablet Commonly known as: TOPROL-XL Take 25 mg by mouth every evening.   multivitamin tablet Take 1 tablet by mouth daily.   ondansetron 4 MG disintegrating tablet Commonly known as: Zofran ODT Take 1 tablet (4 mg total)  by mouth every 8 (eight) hours as needed for nausea or vomiting.   Restasis 0.05 % ophthalmic emulsion Generic drug: cycloSPORINE Place 1 drop into both eyes 2 (two) times daily.   simvastatin 40 MG tablet Commonly known as: ZOCOR Take 40 mg by mouth every evening.   Turmeric 500 MG Caps Take 500 mg by mouth daily.   vitamin B-12 100 MCG tablet Commonly known as: CYANOCOBALAMIN Take 100 mcg by mouth daily.   vitamin E 180 MG (400 UNITS) capsule Take 400 Units by mouth daily.            Discharge Care  Instructions  (From admission, onward)         Start     Ordered   11/21/20 0000  If the dressing is still on your incision site when you go home, remove it on the third day after your surgery date. Remove dressing if it begins to fall off, or if it is dirty or damaged before the third day.        11/21/20 0830           Brief H and P: For complete details please refer to admission H and P, but in brief *Elizabeth [name] a 80 y.o.femalewith medical history significant fortobacco dependence, mild depression, renal calculi, COPD GERD andrecent laminectomy discharge November 12, 2020 gradual onset of abdominal pain which she describes as located on the left side with burning worse with movement and associated with nausea and vomiting.Found to have acute pancreatitis and admitted. Also, notes constipation.   Hospital Course:   Acute pancreatitis likely secondary to pancreatic divisum, in the postoperative setting  -CT abdomen showedacute pancreatitis with pancreatic and biliary ductal enlargement, no etiology of ductal obstruction, pancreatic necrosis or drainable fluid collection  - RUQ u/s reveals possible obstruction like choledocolithiasis -Patient was placed on IV fluids, n.p.o. status and pain control -GI was consulted, MRCP showed acute pancreatitis, no necrosis, very extensive inflammatory fat stranding about the entirety of the retroperitoneal.  Pancreatic divisum and pancreatic duct is diffusely dilated to the papula measuring up to 7 mm.  No pancreatic mass, gallbladder is distended but no gallstones or biliary ductal dilatation. -Per GI, continue supportive care, no further testing evaluation needed at this time -Lipase improved from 8117-> 145-> 32  -Triglycerides 104 Patient tolerating solid diet without any difficulty, outpatient follow-up with GI.  Shortness of breath with mild acute respiratory failure with hypoxia, wheezing, mild COPD exacerbation -On 1/18, patient  was noted to have oxygen desaturation to 88% and was placed on O2, on exam noted to have some wheezing.  -Stat BNP 452.8, IV fluids were discontinued, given 1 dose of Lasix 20 mg IV x1 and subsequently received Lasix 20 mg p.o. x1 with significant improvement of the symptoms -chest x-ray showed interstitial prominences and patchy basilar opacities, patient was started on Augmentin Patient was placed on albuterol nebs, Incruse Ellipta, continue Augmentin for 5 more days to complete full course  Constipation Continue Colace, add MiraLAX  H/o Diverticulitis -Currently no acute diverticulitis  GERD Continue Protonix  Neurogenic Claudication - Recent laminectomy  - continue pain management with IV Dilaudid   Hypertension Continue Norvasc, Catapres, Toprol, Avalide   Hypercholesterolemia Continue Zocor  Tobacco abuse -Continue nicotine patch    Day of Discharge S: No new complaints, tolerating solid diet without difficulty, states breathing status is improved.  Looking forward to go home today.   BP (!) 151/52 (BP Location: Right Arm)   Pulse  79   Temp 98.4 F (36.9 C) (Oral)   Resp 20   SpO2 98%   Physical Exam: General: Alert and awake oriented x3 not in any acute distress. HEENT: anicteric sclera, pupils reactive to light and accommodation CVS: S1-S2 clear no murmur rubs or gallops Chest: clear to auscultation bilaterally, no wheezing rales or rhonchi Abdomen: soft nontender, nondistended, normal bowel sounds Extremities: no cyanosis, clubbing or edema noted bilaterally Neuro: Cranial nerves II-XII intact, no focal neurological deficits    Get Medicines reviewed and adjusted: Please take all your medications with you for your next visit with your Primary MD  Please request your Primary MD to go over all hospital tests and procedure/radiological results at the follow up. Please ask your Primary MD to get all Hospital records sent to his/her office.  If  you experience worsening of your admission symptoms, develop shortness of breath, life threatening emergency, suicidal or homicidal thoughts you must seek medical attention immediately by calling 911 or calling your MD immediately  if symptoms less severe.  You must read complete instructions/literature along with all the possible adverse reactions/side effects for all the Medicines you take and that have been prescribed to you. Take any new Medicines after you have completely understood and accept all the possible adverse reactions/side effects.   Do not drive when taking pain medications.   Do not take more than prescribed Pain, Sleep and Anxiety Medications  Special Instructions: If you have smoked or chewed Tobacco  in the last 2 yrs please stop smoking, stop any regular Alcohol  and or any Recreational drug use.  Wear Seat belts while driving.  Please note  You were cared for by a hospitalist during your hospital stay. Once you are discharged, your primary care physician will handle any further medical issues. Please note that NO REFILLS for any discharge medications will be authorized once you are discharged, as it is imperative that you return to your primary care physician (or establish a relationship with a primary care physician if you do not have one) for your aftercare needs so that they can reassess your need for medications and monitor your lab values.   The results of significant diagnostics from this hospitalization (including imaging, microbiology, ancillary and laboratory) are listed below for reference.      Procedures/Studies:  CT ABDOMEN PELVIS W CONTRAST  Result Date: 11/17/2020 CLINICAL DATA:  54-year-old female with back pain following spine surgery five days ago. Constipation. EXAM: CT ABDOMEN AND PELVIS WITH CONTRAST TECHNIQUE: Multidetector CT imaging of the abdomen and pelvis was performed using the standard protocol following bolus administration of intravenous  contrast. CONTRAST:  37mL OMNIPAQUE IOHEXOL 300 MG/ML  SOLN COMPARISON:  CT Abdomen and Pelvis 04/11/2020. FINDINGS: Lower chest: Chronic bulky soft plaque or thrombus in the descending thoracic aorta is stable since June (series 3, image 4). Calcified aortic atherosclerosis. No cardiomegaly or pericardial effusion. Both lung bases are positive for dependent ground-glass and confluent opacity slightly greater on the right. Favor atelectasis. No pleural effusion. Hepatobiliary: Small volume perihepatic free fluid and new intrahepatic biliary ductal dilatation and periportal edema. Liver parenchymal enhancement is stable and within normal limits. Gallbladder remains within normal limits. Pancreas: Heterogeneously enhancing with confluent surrounding soft tissue edema and/or small volume free fluid. Dilated main pancreatic duct is new since June (series 3, image 25) but there is no obstructing mass or etiology identified. The duct tapers in the distal pancreas. No pancreatic necrosis identified. Spleen: Negative aside from adjacent soft  tissue edema or trace free fluid. Adrenals/Urinary Tract: Adrenal glands remain within normal limits. Bilateral kidneys are stable. Renal enhancement and contrast excretion is symmetric and within normal limits. Stomach/Bowel: Oral contrast administered and has reached the distal small bowel but not yet the colon. The stomach is distended. But otherwise there is no abnormally dilated large or small bowel. Retained stool throughout the colon with intermittent diverticulosis (most pronounced in the right colon). No convincing small or large bowel inflammation. No free air. Vascular/Lymphatic: Severe Aortoiliac calcified atherosclerosis. Major arterial structures in the abdomen and pelvis remain patent. 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 There is also chronic severe stenosis at  the aortoiliac bifurcation, but the major arterial structures remain patent. Portal venous system is patent.  Splenic vein remains patent. No lymphadenopathy. Reproductive: Stable since June, within normal limits aside from parametrial venous enlargement (series 3, image 66). Other: No pelvic free fluid. Musculoskeletal: Postoperative changes to the posterior elements at L3-L4 and L4-L5. Small volume residual postoperative soft tissue gas along the left erector spinae muscles. No unexpected osseous changes identified. Chronic left femur ORIF. IMPRESSION: 1. Positive for Acute Pancreatitis, with pancreatic and biliary ductal enlargement. No etiology of ductal obstruction is identified by CT. 2. Associated peripancreatic and periportal edema with small volume free fluid in the abdomen and pelvis. No pancreatic necrosis, organized or drainable fluid collection. 3. 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 But no major arterial occlusion is identified. - small but developing infrarenal abdominal aortic aneurysm with mural plaque or thrombus, 23 mm diameter and stable since June. - additional chronic bulky soft plaque or thrombus in the descending thoracic aorta, stable since June. 4. Recent posterior decompression of the lumbar spine with no adverse features. Electronically Signed   By: Genevie Ann M.D.   On: 11/17/2020 04:52   DG Lumbar Spine 1 View  Result Date: 11/12/2020 CLINICAL DATA:  Intraop localization EXAM: LUMBAR SPINE - 1 VIEW COMPARISON:  CT abdomen/pelvis dated 04/11/2020 FINDINGS: Single lateral view of the lumbar spine with a surgical probe at the L4-5 level. IMPRESSION: Intraoperative localization as above. Electronically Signed   By: Julian Hy M.D.   On: 11/12/2020 10:32   DG CHEST PORT 1 VIEW  Result Date: 11/20/2020 CLINICAL DATA:  Acute respiratory failure with hypoxia. EXAM: PORTABLE CHEST 1 VIEW  COMPARISON:  07/17/2016 and prior. FINDINGS: No pneumothorax or pleural effusion. Diffuse interstitial prominence with patchy bibasilar opacities. Cardiomediastinal silhouette within normal limits. No acute osseous abnormality. IMPRESSION: Interstitial prominence and patchy basilar opacities. Differential includes infection versus chronic sequela with atelectasis. Electronically Signed   By: Primitivo Gauze M.D.   On: 11/20/2020 08:19   MR ABDOMEN MRCP W WO CONTAST  Result Date: 11/19/2020 CLINICAL DATA:  Pancreatitis suspected EXAM: MRI ABDOMEN WITHOUT AND WITH CONTRAST (INCLUDING MRCP) TECHNIQUE: Multiplanar multisequence MR imaging of the abdomen was performed both before and after the administration of intravenous contrast. Heavily T2-weighted images of the biliary and pancreatic ducts were obtained, and three-dimensional MRCP images were rendered by post processing. CONTRAST:  94mL GADAVIST GADOBUTROL 1 MMOL/ML IV SOLN COMPARISON:  CT abdomen pelvis, abdominal ultrasound, 11/17/2020 FINDINGS: Lower chest: Small bilateral pleural effusions and associated atelectasis or consolidation. Hepatobiliary: No mass or other parenchymal abnormality identified. The gallbladder is distended, however there are no gallstones identified. No biliary ductal dilatation. The common bile duct measures  up to 6 mm in caliber and is patent to the ampulla without calculus or other filling defect. Pancreas: There is diffuse inflammatory fat stranding about the pancreas and adjacent retroperitoneum. There is no evidence of acute pancreatic fluid collection or parenchymal hypoenhancement to suggest necrosis. There is pancreatic divisum. The pancreatic duct is diffusely dilated to the papillae, measuring up to 7 mm. No mass or other parenchymal abnormality identified. Spleen:  Within normal limits in size and appearance. Adrenals/Urinary Tract: No masses identified. No evidence of hydronephrosis. Stomach/Bowel: Visualized portions  within the abdomen are unremarkable. Vascular/Lymphatic: No pathologically enlarged lymph nodes identified. No abdominal aortic aneurysm demonstrated. Other: Very extensive inflammatory fat stranding throughout the visualized retroperitoneum. Small volume ascites throughout the abdomen. Anasarca. Musculoskeletal: No suspicious bone lesions identified. IMPRESSION: 1. Acute pancreatitis. No evidence of acute pancreatic fluid collection or parenchymal hypoenhancement to suggest necrosis. There is very extensive inflammatory fat stranding about the entirety of the included retroperitoneum. 2. There is pancreatic divisum and the pancreatic duct is diffusely dilated to the papillae, measuring up to 7 mm. No pancreatic mass or other parenchymal abnormality identified. 3. The gallbladder is distended, however there are no gallstones identified. No biliary ductal dilatation. 4. Pleural effusions, ascites, and anasarca. Electronically Signed   By: Lauralyn Primes M.D.   On: 11/19/2020 07:46   US Abdomen Limited RUQ (LIVER/GB)  Result Date: 11/17/2020 CLINICAL DATA:  Pancreatitis EXAM: ULTRASOUND ABDOMEN LIMITED RIGHT UPPER QUADRANT COMPARISON:  CT from same day FINDINGS: Gallbladder: No gallstones or wall thickening visualized. No sonographic Murphy sign noted by sonographer. Common bile duct: Diameter: 7.3 cm Liver: No focal lesion identified. Within normal limits in parenchymal echogenicity. Portal vein is patent on color Doppler imaging with normal direction of blood flow towards the liver. Other: There is a trace amount of free fluid in the upper abdomen. The partially visualized pancreatic duct appears to be dilated. IMPRESSION: 1. No evidence for cholelithiasis. 2. Dilatation of the pancreatic duct and common bile duct raises concern for an obstructing process such as choledocholithiasis. Follow-up with MRCP/ERCP is recommended. 3. Trace free fluid in the upper abdomen. Electronically Signed   By: Katherine Mantle  M.D.   On: 11/17/2020 06:57       LAB RESULTS: Basic Metabolic Panel: Recent Labs  Lab 11/20/20 0150 11/21/20 0137  NA 136 136  K 3.3* 2.9*  CL 107 103  CO2 21* 23  GLUCOSE 102* 114*  BUN 7* <5*  CREATININE 0.67 0.64  CALCIUM 7.5* 8.0*   Liver Function Tests: Recent Labs  Lab 11/20/20 0150 11/21/20 0137  AST 21 24  ALT 10 11  ALKPHOS 39 43  BILITOT 0.7 1.0  PROT 4.6* 5.2*  ALBUMIN 2.2* 2.5*   Recent Labs  Lab 11/20/20 0150 11/21/20 0137  LIPASE 32 38   No results for input(s): AMMONIA in the last 168 hours. CBC: Recent Labs  Lab 11/17/20 0130 11/17/20 0143 11/19/20 0233 11/20/20 0150  WBC 9.9   < > 8.1 7.1  NEUTROABS 7.9*  --   --   --   HGB 13.1   < > 10.3* 10.1*  HCT 39.6   < > 32.1* 28.9*  MCV 94.7   < > 97.3 92.9  PLT 247   < > 177 193   < > = values in this interval not displayed.   Cardiac Enzymes: No results for input(s): CKTOTAL, CKMB, CKMBINDEX, TROPONINI in the last 168 hours. BNP: Invalid input(s): POCBNP CBG: No results for input(s):  GLUCAP in the last 168 hours.     Disposition and Follow-up: Discharge Instructions    Diet - low sodium heart healthy   Complete by: As directed    If the dressing is still on your incision site when you go home, remove it on the third day after your surgery date. Remove dressing if it begins to fall off, or if it is dirty or damaged before the third day.   Complete by: As directed    Increase activity slowly   Complete by: As directed    No wound care   Complete by: As directed        DISPOSITION: Home   DISCHARGE FOLLOW-UP  Follow-up Information    [name], MD. Schedule an appointment as soon as possible for a visit in 2 week(s).   Specialty: Internal Medicine Contact information: [address] [place] [address] [phone]        Ronald Lobo, MD. Schedule an appointment as soon as possible for a visit in 2 week(s).   Specialty:  Gastroenterology Contact information: [address] [place] [address] [phone]                Time coordinating discharge:  35 minutes  Signed:   Estill Cotta M.D. [facility] 11/21/2020, 9:59 AM

## 2020-11-26 DIAGNOSIS — K859 Acute pancreatitis without necrosis or infection, unspecified: Secondary | ICD-10-CM | POA: Diagnosis not present

## 2020-11-26 DIAGNOSIS — Q453 Other congenital malformations of pancreas and pancreatic duct: Secondary | ICD-10-CM | POA: Diagnosis not present

## 2020-11-26 DIAGNOSIS — K851 Biliary acute pancreatitis without necrosis or infection: Secondary | ICD-10-CM | POA: Diagnosis not present

## 2020-12-20 ENCOUNTER — Emergency Department (HOSPITAL_COMMUNITY)
Admission: EM | Admit: 2020-12-20 | Discharge: 2020-12-20 | Disposition: A | Discharge status: Home / Self Care | Payer: Medicare Other | Attending: Emergency Medicine | Admitting: Emergency Medicine

## 2020-12-20 ENCOUNTER — Other Ambulatory Visit: Payer: Self-pay

## 2020-12-20 ENCOUNTER — Emergency Department (HOSPITAL_COMMUNITY): Payer: Medicare Other

## 2020-12-20 DIAGNOSIS — Z79899 Other long term (current) drug therapy: Secondary | ICD-10-CM | POA: Diagnosis not present

## 2020-12-20 DIAGNOSIS — Y9301 Activity, walking, marching and hiking: Secondary | ICD-10-CM | POA: Diagnosis not present

## 2020-12-20 DIAGNOSIS — S93601A Unspecified sprain of right foot, initial encounter: Secondary | ICD-10-CM | POA: Insufficient documentation

## 2020-12-20 DIAGNOSIS — F17210 Nicotine dependence, cigarettes, uncomplicated: Secondary | ICD-10-CM | POA: Insufficient documentation

## 2020-12-20 DIAGNOSIS — Z8679 Personal history of other diseases of the circulatory system: Secondary | ICD-10-CM | POA: Insufficient documentation

## 2020-12-20 DIAGNOSIS — I10 Essential (primary) hypertension: Secondary | ICD-10-CM | POA: Diagnosis not present

## 2020-12-20 DIAGNOSIS — J449 Chronic obstructive pulmonary disease, unspecified: Secondary | ICD-10-CM | POA: Diagnosis not present

## 2020-12-20 DIAGNOSIS — M7989 Other specified soft tissue disorders: Secondary | ICD-10-CM | POA: Diagnosis not present

## 2020-12-20 DIAGNOSIS — X58XXXA Exposure to other specified factors, initial encounter: Secondary | ICD-10-CM | POA: Diagnosis not present

## 2020-12-20 DIAGNOSIS — Z7982 Long term (current) use of aspirin: Secondary | ICD-10-CM | POA: Insufficient documentation

## 2020-12-20 DIAGNOSIS — S99921A Unspecified injury of right foot, initial encounter: Secondary | ICD-10-CM | POA: Diagnosis not present

## 2020-12-20 MED ORDER — OXYCODONE-ACETAMINOPHEN 5-325 MG PO TABS
2.0000 | ORAL_TABLET | Freq: Once | ORAL | Status: AC
  Administered 2020-12-20: 2 via ORAL

## 2020-12-20 MED ORDER — OXYCODONE HCL 5 MG PO TABS
5.0000 mg | ORAL_TABLET | Freq: Once | ORAL | Status: AC
  Administered 2020-12-20: 5 mg via ORAL
  Filled 2020-12-20: qty 1

## 2020-12-20 MED ORDER — OXYCODONE-ACETAMINOPHEN 5-325 MG PO TABS
ORAL_TABLET | ORAL | Status: AC
  Filled 2020-12-20: qty 2

## 2020-12-20 MED ORDER — HYDROCODONE-ACETAMINOPHEN 5-325 MG PO TABS: 1.0000 | ORAL_TABLET | ORAL | 0 refills | Status: DC | PRN

## 2020-12-20 NOTE — ED Triage Notes (Signed)
Pt reports walking on uneven ground, injured right foot. Noted to have swelling. Hasn't taken anything for the pain.

## 2020-12-20 NOTE — ED Provider Notes (Signed)
[facility] EMERGENCY DEPARTMENT Provider Note   CSN: [id number] Arrival date & time: 12/20/20  1839     History Chief Complaint  Patient presents with  . Foot Swelling    Elizabeth [name] is a 80 y.o. female.  Pt presents to the ED today with right foot swelling.  Pt said she was walking on uneven ground and hurt her right foot.  She is not sure what she did.  No other injury.  Pt given oxycodone in triage.        Past Medical History:  Diagnosis Date  . Abdominal cramping    possibly irritable bowel syndrome versus adhesions secondary to multiple surgeries and abdominal inflammation and 2009 secondary to diverticulitis and diverticular phlegmon  . Allergic rhinitis   . Arthritis   . Carotid artery disease (HCC)    02-54% RICA, < 27% LICA 04/04/36 [name] ([facility])  . Cataract   . Chest pain syndrome 2004   with adenosine Cardiolite that was normal  . Chronic hoarseness   . COPD (chronic obstructive pulmonary disease) ([place])   . GERD (gastroesophageal reflux disease)   . Heart murmur    Mild AS, AI. AV pk grad 18.79 mmHg, mn grad 11.20 mmHg, AVA (VTI) 1.05 cm2 10/10/20, 3 year f/u rec ([facility])  . History of kidney stones   . History of renal calculi   . Hypercholesterolemia   . Hypertension   . Incontinence    Cough incontinence  . Mild depression ([place])   . Neuromuscular disorder (HCC)    neuropathy bilateral lower extremities  . Osteopenia   . Peripheral vascular disease ([place])    with femoral and carotid bruits  . PONV (postoperative nausea and vomiting)   . Tobacco dependence   . Vitamin D deficiency     Patient Active Problem List   Diagnosis Date Noted  . Acute pancreatitis 11/17/2020  . COPD (chronic obstructive pulmonary disease) ([place])   . Hypertension   . 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    Past Surgical History:  Procedure Laterality Date  . APPENDECTOMY    . BREAST BIOPSY    . CATARACT  EXTRACTION, BILATERAL    . COLON SURGERY     2009  . COSMETIC SURGERY     on neck and eyes  . DILATION AND CURETTAGE OF UTERUS    . EYE SURGERY     2014  . FEMUR SURGERY  12/2007   left femur surgery--for femoral neck fracture status post fall   . FRACTURE SURGERY     broke left leg and left arm- 2012  . LIPOSUCTION    . LUMBAR LAMINECTOMY/DECOMPRESSION MICRODISCECTOMY N/A 11/12/2020   Procedure: LAMINECTOMY AND FORAMINOTOMY Lumbar three four, Lumbar four five, Lumbar Five Sacral one;  Surgeon: Newman Pies, MD;  Location: [facility];  Service: Neurosurgery;  Laterality: N/A;  . OTHER SURGICAL HISTORY     sigmoid colectomy  . SHOULDER SURGERY     arthroscopic shoulder surgery   . TONSILLECTOMY    . TUBAL LIGATION       OB History   No obstetric history on file.     Family History  Problem Relation Age of Onset  . Heart disease Mother   . Stroke Mother     Social History   Tobacco Use  . Smoking status: Current Every Day Smoker    Packs/day: 0.50    Years: 60.00    Pack years: 30.00  Types: Cigarettes  . Smokeless tobacco: Never Used  Vaping Use  . Vaping Use: Never used  Substance Use Topics  . Alcohol use: Yes    Alcohol/week: 0.0 standard drinks    Comment: Occ glass of wine  . Drug use: No    Home Medications Prior to Admission medications   Medication Sig Start Date End Date Taking? Authorizing Provider  HYDROcodone-acetaminophen (NORCO/VICODIN) 5-325 MG tablet Take 1 tablet by mouth every 4 (four) hours as needed. 12/20/20  Yes Isla Pence, MD  albuterol (VENTOLIN HFA) 108 (90 Base) MCG/ACT inhaler Inhale 2 puffs into the lungs every 6 (six) hours as needed for wheezing or shortness of breath. 11/21/20   Rai, Ripudeep K, MD  amLODipine (NORVASC) 5 MG tablet Take 5 mg by mouth daily. 02/14/20   [provider]  aspirin 81 MG tablet Take 81 mg by mouth every evening.     [provider]  Besifloxacin HCl (BESIVANCE) 0.6 % SUSP Place 1  drop into both eyes in the morning and at bedtime.    [provider]  cloNIDine (CATAPRES) 0.1 MG tablet Take 0.1 mg by mouth at bedtime.     [provider]  Coenzyme Q10 100 MG capsule Take 100 mg by mouth daily.    [provider]  cycloSPORINE (RESTASIS) 0.05 % ophthalmic emulsion Place 1 drop into both eyes 2 (two) times daily.    [provider]  docusate sodium (COLACE) 100 MG capsule Take 1 capsule (100 mg total) by mouth 2 (two) times daily. Patient taking differently: Take 100 mg by mouth 2 (two) times daily as needed for mild constipation. 11/12/20   Newman Pies, MD  famotidine (PEPCID) 20 MG tablet Take 20 mg by mouth every other day.     [provider]  gabapentin (NEURONTIN) 100 MG capsule Take 2 capsules (200 mg total) by mouth at bedtime. PATIENT NEEDS OFFICE VISIT FOR ADDITIONAL REFILLS 07/30/15   [name], MD  INCRUSE ELLIPTA 62.5 MCG/INH AEPB Inhale 1 puff into the lungs daily. 11/21/20   Rai, Vernelle Emerald, MD  irbesartan-hydrochlorothiazide (AVALIDE) 150-12.5 MG per tablet Take 1 tablet by mouth daily.    [provider]  metoprolol succinate (TOPROL-XL) 25 MG 24 hr tablet Take 25 mg by mouth every evening.     [provider]  Multiple Vitamin (MULTIVITAMIN) tablet Take 1 tablet by mouth daily.    [provider]  ondansetron (ZOFRAN ODT) 4 MG disintegrating tablet Take 1 tablet (4 mg total) by mouth every 8 (eight) hours as needed for nausea or vomiting. 11/21/20   Rai, Vernelle Emerald, MD  simvastatin (ZOCOR) 40 MG tablet Take 40 mg by mouth every evening.     [provider]  Turmeric 500 MG CAPS Take 500 mg by mouth daily.    [provider]  vitamin B-12 (CYANOCOBALAMIN) 100 MCG tablet Take 100 mcg by mouth daily.    [provider]  vitamin E 180 MG (400 UNITS) capsule Take 400 Units by mouth daily.    [provider]    Allergies    Other, Pollen extract,  Tegretol [carbamazepine], Lisinopril, and Tetracyclines & related  Review of Systems   Review of Systems  Musculoskeletal:       Right foot pain  All other systems reviewed and are negative.   Physical Exam Updated Vital Signs BP (!) 168/113 (BP Location: Right Arm)   Pulse 75   Temp 98 F (36.7 C) (Oral)  Resp (!) 26   SpO2 100%   Physical Exam Vitals and nursing note reviewed.  Constitutional:      Appearance: Normal appearance.  HENT:     Head: Normocephalic and atraumatic.     Right Ear: External ear normal.     Left Ear: External ear normal.     Nose: Nose normal.     Mouth/Throat:     Mouth: Mucous membranes are moist.     Pharynx: Oropharynx is clear.  Eyes:     Extraocular Movements: Extraocular movements intact.     Conjunctiva/sclera: Conjunctivae normal.     Pupils: Pupils are equal, round, and reactive to light.  Cardiovascular:     Rate and Rhythm: Normal rate and regular rhythm.     Pulses: Normal pulses.     Heart sounds: Normal heart sounds.  Pulmonary:     Effort: Pulmonary effort is normal.     Breath sounds: Normal breath sounds.  Abdominal:     General: Abdomen is flat. Bowel sounds are normal.     Palpations: Abdomen is soft.  Musculoskeletal:     Cervical back: Normal range of motion and neck supple.       Feet:  Neurological:     Mental Status: She is alert.     ED Results / Procedures / Treatments   Labs (all labs ordered are listed, but only abnormal results are displayed) Labs Reviewed - No data to display  EKG None  Radiology DG Foot Complete Right  Result Date: 12/20/2020 CLINICAL DATA:  Twisting injury today while walking. Pain and swelling. EXAM: RIGHT FOOT COMPLETE - 3+ VIEW COMPARISON:  None. FINDINGS: Nonspecific soft tissue swelling but no evidence of fracture or dislocation. IMPRESSION: Nonspecific soft tissue swelling. No evidence of fracture or dislocation. Electronically Signed   By: Nelson Chimes M.D.   On:  12/20/2020 19:32    Procedures Procedures   Medications Ordered in ED Medications  oxyCODONE-acetaminophen (PERCOCET/ROXICET) 5-325 MG per tablet 2 tablet (2 tablets Oral Given 12/20/20 1917)    ED Course  I have reviewed the triage vital signs and the nursing notes.  Pertinent labs & imaging results that were available during my care of the patient were reviewed by me and considered in my medical decision making (see chart for details).    MDM Rules/Calculators/A&P                          Pt did not break any bones in her foot, but I suspect she sprained her foot as there is some bruising and swelling.  Pt placed in a cam walker.  She is to f/u with ortho.  Final Clinical Impression(s) / ED Diagnoses Final diagnoses:  Sprain of right foot, initial encounter    Rx / DC Orders ED Discharge Orders         Ordered    HYDROcodone-acetaminophen (NORCO/VICODIN) 5-325 MG tablet  Every 4 hours PRN        12/20/20 2236           Isla Pence, MD 12/20/20 2236

## 2020-12-20 NOTE — Progress Notes (Signed)
Orthopedic Tech Progress Note Patient Details:  Teodora Baumgarten [facility] 1941/06/23 [id number]  Ortho Devices Type of Ortho Device: CAM walker Ortho Device/Splint Location: Right Foot Ortho Device/Splint Interventions: Application   Post Interventions Patient Tolerated: Well Instructions Provided: Adjustment of device   Keanu Lesniak E Isa Hitz 12/20/2020, 10:41 PM

## 2021-01-10 DIAGNOSIS — M79671 Pain in right foot: Secondary | ICD-10-CM | POA: Insufficient documentation

## 2021-01-11 DIAGNOSIS — M79671 Pain in right foot: Secondary | ICD-10-CM | POA: Diagnosis not present

## 2021-01-29 DIAGNOSIS — R413 Other amnesia: Secondary | ICD-10-CM | POA: Diagnosis not present

## 2021-01-29 DIAGNOSIS — J449 Chronic obstructive pulmonary disease, unspecified: Secondary | ICD-10-CM | POA: Diagnosis not present

## 2021-01-29 DIAGNOSIS — I351 Nonrheumatic aortic (valve) insufficiency: Secondary | ICD-10-CM | POA: Diagnosis not present

## 2021-01-29 DIAGNOSIS — Q453 Other congenital malformations of pancreas and pancreatic duct: Secondary | ICD-10-CM | POA: Diagnosis not present

## 2021-01-29 DIAGNOSIS — R011 Cardiac murmur, unspecified: Secondary | ICD-10-CM | POA: Diagnosis not present

## 2021-01-29 DIAGNOSIS — I10 Essential (primary) hypertension: Secondary | ICD-10-CM | POA: Diagnosis not present

## 2021-02-01 DIAGNOSIS — M79671 Pain in right foot: Secondary | ICD-10-CM | POA: Diagnosis not present

## 2021-03-04 DIAGNOSIS — M79671 Pain in right foot: Secondary | ICD-10-CM | POA: Diagnosis not present

## 2021-03-08 DIAGNOSIS — I10 Essential (primary) hypertension: Secondary | ICD-10-CM | POA: Diagnosis not present

## 2021-03-08 DIAGNOSIS — M48062 Spinal stenosis, lumbar region with neurogenic claudication: Secondary | ICD-10-CM | POA: Diagnosis not present

## 2021-04-04 ENCOUNTER — Other Ambulatory Visit: Payer: Self-pay

## 2021-04-04 ENCOUNTER — Emergency Department (HOSPITAL_COMMUNITY): Payer: Medicare Other

## 2021-04-04 ENCOUNTER — Encounter (HOSPITAL_COMMUNITY): Payer: Self-pay | Admitting: Emergency Medicine

## 2021-04-04 ENCOUNTER — Inpatient Hospital Stay (HOSPITAL_COMMUNITY)
Admission: EM | Admit: 2021-04-04 | Discharge: 2021-04-07 | DRG: 871 | Disposition: A | Discharge status: Home / Self Care | Payer: Medicare Other | Attending: Family Medicine | Admitting: Family Medicine

## 2021-04-04 DIAGNOSIS — I10 Essential (primary) hypertension: Secondary | ICD-10-CM | POA: Diagnosis not present

## 2021-04-04 DIAGNOSIS — R6520 Severe sepsis without septic shock: Secondary | ICD-10-CM | POA: Diagnosis present

## 2021-04-04 DIAGNOSIS — Z87442 Personal history of urinary calculi: Secondary | ICD-10-CM | POA: Diagnosis not present

## 2021-04-04 DIAGNOSIS — D72825 Bandemia: Secondary | ICD-10-CM | POA: Diagnosis not present

## 2021-04-04 DIAGNOSIS — Z79899 Other long term (current) drug therapy: Secondary | ICD-10-CM | POA: Diagnosis not present

## 2021-04-04 DIAGNOSIS — J9601 Acute respiratory failure with hypoxia: Secondary | ICD-10-CM | POA: Diagnosis not present

## 2021-04-04 DIAGNOSIS — N200 Calculus of kidney: Secondary | ICD-10-CM | POA: Diagnosis not present

## 2021-04-04 DIAGNOSIS — J440 Chronic obstructive pulmonary disease with acute lower respiratory infection: Secondary | ICD-10-CM | POA: Diagnosis present

## 2021-04-04 DIAGNOSIS — Z8249 Family history of ischemic heart disease and other diseases of the circulatory system: Secondary | ICD-10-CM | POA: Diagnosis not present

## 2021-04-04 DIAGNOSIS — R109 Unspecified abdominal pain: Secondary | ICD-10-CM | POA: Diagnosis not present

## 2021-04-04 DIAGNOSIS — A419 Sepsis, unspecified organism: Secondary | ICD-10-CM | POA: Diagnosis not present

## 2021-04-04 DIAGNOSIS — J449 Chronic obstructive pulmonary disease, unspecified: Secondary | ICD-10-CM | POA: Diagnosis present

## 2021-04-04 DIAGNOSIS — Z888 Allergy status to other drugs, medicaments and biological substances status: Secondary | ICD-10-CM | POA: Diagnosis not present

## 2021-04-04 DIAGNOSIS — I700 Atherosclerosis of aorta: Secondary | ICD-10-CM | POA: Diagnosis not present

## 2021-04-04 DIAGNOSIS — Z823 Family history of stroke: Secondary | ICD-10-CM | POA: Diagnosis not present

## 2021-04-04 DIAGNOSIS — G9341 Metabolic encephalopathy: Secondary | ICD-10-CM | POA: Diagnosis present

## 2021-04-04 DIAGNOSIS — N2889 Other specified disorders of kidney and ureter: Secondary | ICD-10-CM | POA: Diagnosis not present

## 2021-04-04 DIAGNOSIS — M8580 Other specified disorders of bone density and structure, unspecified site: Secondary | ICD-10-CM | POA: Diagnosis present

## 2021-04-04 DIAGNOSIS — K589 Irritable bowel syndrome without diarrhea: Secondary | ICD-10-CM | POA: Diagnosis present

## 2021-04-04 DIAGNOSIS — E559 Vitamin D deficiency, unspecified: Secondary | ICD-10-CM | POA: Diagnosis present

## 2021-04-04 DIAGNOSIS — Z87891 Personal history of nicotine dependence: Secondary | ICD-10-CM | POA: Diagnosis not present

## 2021-04-04 DIAGNOSIS — I739 Peripheral vascular disease, unspecified: Secondary | ICD-10-CM | POA: Diagnosis present

## 2021-04-04 DIAGNOSIS — R918 Other nonspecific abnormal finding of lung field: Secondary | ICD-10-CM | POA: Diagnosis not present

## 2021-04-04 DIAGNOSIS — J189 Pneumonia, unspecified organism: Secondary | ICD-10-CM | POA: Diagnosis not present

## 2021-04-04 DIAGNOSIS — K5792 Diverticulitis of intestine, part unspecified, without perforation or abscess without bleeding: Secondary | ICD-10-CM | POA: Diagnosis present

## 2021-04-04 DIAGNOSIS — R531 Weakness: Secondary | ICD-10-CM | POA: Diagnosis not present

## 2021-04-04 DIAGNOSIS — R000 Tachycardia, unspecified: Secondary | ICD-10-CM | POA: Diagnosis not present

## 2021-04-04 DIAGNOSIS — J441 Chronic obstructive pulmonary disease with (acute) exacerbation: Secondary | ICD-10-CM | POA: Diagnosis present

## 2021-04-04 DIAGNOSIS — Z7982 Long term (current) use of aspirin: Secondary | ICD-10-CM | POA: Diagnosis not present

## 2021-04-04 DIAGNOSIS — E871 Hypo-osmolality and hyponatremia: Secondary | ICD-10-CM

## 2021-04-04 DIAGNOSIS — R1084 Generalized abdominal pain: Secondary | ICD-10-CM | POA: Diagnosis not present

## 2021-04-04 DIAGNOSIS — J984 Other disorders of lung: Secondary | ICD-10-CM | POA: Diagnosis not present

## 2021-04-04 DIAGNOSIS — E876 Hypokalemia: Secondary | ICD-10-CM | POA: Diagnosis not present

## 2021-04-04 DIAGNOSIS — M1990 Unspecified osteoarthritis, unspecified site: Secondary | ICD-10-CM | POA: Diagnosis present

## 2021-04-04 DIAGNOSIS — Z20822 Contact with and (suspected) exposure to covid-19: Secondary | ICD-10-CM | POA: Diagnosis present

## 2021-04-04 DIAGNOSIS — R1032 Left lower quadrant pain: Secondary | ICD-10-CM | POA: Diagnosis not present

## 2021-04-04 DIAGNOSIS — E782 Mixed hyperlipidemia: Secondary | ICD-10-CM | POA: Diagnosis not present

## 2021-04-04 DIAGNOSIS — I959 Hypotension, unspecified: Secondary | ICD-10-CM | POA: Diagnosis not present

## 2021-04-04 DIAGNOSIS — K219 Gastro-esophageal reflux disease without esophagitis: Secondary | ICD-10-CM

## 2021-04-04 LAB — CBC WITH DIFFERENTIAL/PLATELET
Abs Immature Granulocytes: 0.14 10*3/uL — ABNORMAL HIGH (ref 0.00–0.07)
Basophils Absolute: 0 10*3/uL (ref 0.0–0.1)
Basophils Relative: 0 %
Eosinophils Absolute: 0 10*3/uL (ref 0.0–0.5)
Eosinophils Relative: 0 %
HCT: 34.3 % — ABNORMAL LOW (ref 36.0–46.0)
Hemoglobin: 11.4 g/dL — ABNORMAL LOW (ref 12.0–15.0)
Immature Granulocytes: 1 %
Lymphocytes Relative: 7 %
Lymphs Abs: 1.1 10*3/uL (ref 0.7–4.0)
MCH: 30.7 pg (ref 26.0–34.0)
MCHC: 33.2 g/dL (ref 30.0–36.0)
MCV: 92.5 fL (ref 80.0–100.0)
Monocytes Absolute: 1 10*3/uL (ref 0.1–1.0)
Monocytes Relative: 6 %
Neutro Abs: 13.4 10*3/uL — ABNORMAL HIGH (ref 1.7–7.7)
Neutrophils Relative %: 86 %
Platelets: 162 10*3/uL (ref 150–400)
RBC: 3.71 MIL/uL — ABNORMAL LOW (ref 3.87–5.11)
RDW: 13.7 % (ref 11.5–15.5)
WBC: 15.7 10*3/uL — ABNORMAL HIGH (ref 4.0–10.5)
nRBC: 0 % (ref 0.0–0.2)

## 2021-04-04 LAB — COMPREHENSIVE METABOLIC PANEL
ALT: 12 U/L (ref 0–44)
AST: 49 U/L — ABNORMAL HIGH (ref 15–41)
Albumin: 2.8 g/dL — ABNORMAL LOW (ref 3.5–5.0)
Alkaline Phosphatase: 54 U/L (ref 38–126)
Anion gap: 8 (ref 5–15)
BUN: 19 mg/dL (ref 8–23)
CO2: 23 mmol/L (ref 22–32)
Calcium: 8.2 mg/dL — ABNORMAL LOW (ref 8.9–10.3)
Chloride: 100 mmol/L (ref 98–111)
Creatinine, Ser: 1.13 mg/dL — ABNORMAL HIGH (ref 0.44–1.00)
GFR, Estimated: 49 mL/min — ABNORMAL LOW (ref >60)
Glucose, Bld: 125 mg/dL — ABNORMAL HIGH (ref 70–99)
Potassium: 4 mmol/L (ref 3.5–5.1)
Sodium: 131 mmol/L — ABNORMAL LOW (ref 135–145)
Total Bilirubin: 1.7 mg/dL — ABNORMAL HIGH (ref 0.3–1.2)
Total Protein: 5.4 g/dL — ABNORMAL LOW (ref 6.5–8.1)

## 2021-04-04 LAB — PROTIME-INR
INR: 1.2 (ref 0.8–1.2)
Prothrombin Time: 15.5 seconds — ABNORMAL HIGH (ref 11.4–15.2)

## 2021-04-04 LAB — RESP PANEL BY RT-PCR (FLU A&B, COVID) ARPGX2
Influenza A by PCR: NEGATIVE
Influenza B by PCR: NEGATIVE
SARS Coronavirus 2 by RT PCR: NEGATIVE

## 2021-04-04 LAB — APTT: aPTT: 32 seconds (ref 24–36)

## 2021-04-04 LAB — LACTIC ACID, PLASMA
Lactic Acid, Venous: 2 mmol/L (ref 0.5–1.9)
Lactic Acid, Venous: 1.6 mmol/L (ref 0.5–1.9)

## 2021-04-04 MED ORDER — LACTATED RINGERS IV SOLN: INTRAVENOUS | Status: DC

## 2021-04-04 MED ORDER — ACETAMINOPHEN 500 MG PO TABS
1000.0000 mg | ORAL_TABLET | Freq: Once | ORAL | Status: AC
  Administered 2021-04-04: 1000 mg via ORAL
  Filled 2021-04-04: qty 2

## 2021-04-04 MED ORDER — ACETAMINOPHEN 650 MG RE SUPP: 650.0000 mg | Freq: Four times a day (QID) | RECTAL | Status: DC | PRN

## 2021-04-04 MED ORDER — SODIUM CHLORIDE 0.9 % IV SOLN: 2.0000 g | INTRAVENOUS | Status: DC

## 2021-04-04 MED ORDER — ENOXAPARIN SODIUM 30 MG/0.3ML IJ SOSY
30.0000 mg | PREFILLED_SYRINGE | INTRAMUSCULAR | Status: DC
  Administered 2021-04-04 – 2021-04-06 (×3): 30 mg via SUBCUTANEOUS
  Filled 2021-04-04 (×3): qty 0.3

## 2021-04-04 MED ORDER — SODIUM CHLORIDE 0.9 % IV SOLN
1.0000 g | Freq: Once | INTRAVENOUS | Status: AC
  Administered 2021-04-04: 1 g via INTRAVENOUS
  Filled 2021-04-04: qty 10

## 2021-04-04 MED ORDER — ONDANSETRON HCL 4 MG/2ML IJ SOLN
4.0000 mg | Freq: Four times a day (QID) | INTRAMUSCULAR | Status: DC | PRN
  Administered 2021-04-05: 4 mg via INTRAVENOUS
  Filled 2021-04-04: qty 2

## 2021-04-04 MED ORDER — SODIUM CHLORIDE 0.9 % IV SOLN: 500.0000 mg | INTRAVENOUS | Status: DC

## 2021-04-04 MED ORDER — SODIUM CHLORIDE 0.9 % IV SOLN: 2.0000 g | Freq: Once | INTRAVENOUS | Status: DC

## 2021-04-04 MED ORDER — SODIUM CHLORIDE 0.9 % IV SOLN
500.0000 mg | Freq: Once | INTRAVENOUS | Status: AC
  Administered 2021-04-04: 500 mg via INTRAVENOUS
  Filled 2021-04-04: qty 500

## 2021-04-04 MED ORDER — ONDANSETRON HCL 4 MG PO TABS: 4.0000 mg | ORAL_TABLET | Freq: Four times a day (QID) | ORAL | Status: DC | PRN

## 2021-04-04 MED ORDER — ACETAMINOPHEN 325 MG PO TABS
650.0000 mg | ORAL_TABLET | Freq: Four times a day (QID) | ORAL | Status: DC | PRN
Start: 2021-04-04 — End: 2021-04-07
  Administered 2021-04-05: 650 mg via ORAL
  Filled 2021-04-04: qty 2

## 2021-04-04 MED ORDER — LACTATED RINGERS IV BOLUS (SEPSIS)
1000.0000 mL | Freq: Once | INTRAVENOUS | Status: AC
  Administered 2021-04-04: 1000 mL via INTRAVENOUS

## 2021-04-04 MED ORDER — SODIUM CHLORIDE 0.9 % IV SOLN
500.0000 mg | Freq: Once | INTRAVENOUS | Status: DC
  Filled 2021-04-04: qty 500

## 2021-04-04 NOTE — Sepsis Progress Note (Signed)
Code Sepsis protocol being monitored by eLink. 

## 2021-04-04 NOTE — ED Provider Notes (Signed)
[facility] EMERGENCY DEPARTMENT Provider Note   CSN: [id number] Arrival date & time: 04/04/21  1635     History Chief Complaint  Patient presents with  . Abdominal Pain    Elizabeth [name] is a 80 y.o. female.  Patient is an 80 year old female with a history of carotid artery disease, COPD, hypertension, GERD, IBS and prior pancreatitis who is presenting today from home due to generally not feeling well.  Patient is here with her family member who gives most of the history.  She reports that by yesterday evening she was very tired and went to bed early but this morning when family member came to check on her at 32 she was not in her normal chair and she thought she was still sleeping.  She went back and checked on her around noon or 1 and she was laying on the couch she is very somnolent and did not seem herself.  She just kept repeating that she did not feel well.  Patient has not had anything to eat today and her son came over and after he checked her that he felt like they needed to call 911.  Family member reports that she would answer but seem very distant and lethargic.  Patient states she has had an occasional cough but denies any vomiting.  No abdominal pain.  She has had some diarrhea and nausea today.  She denies any chest pain or shortness of breath.  She was complaining to family members that her arm was hurting but denies that at this time.  She has no known sick contacts and has had no recent medication changes.  The history is provided by the patient.  Abdominal Pain      Past Medical History:  Diagnosis Date  . Abdominal cramping    possibly irritable bowel syndrome versus adhesions secondary to multiple surgeries and abdominal inflammation and 2009 secondary to diverticulitis and diverticular phlegmon  . Allergic rhinitis   . Arthritis   . Carotid artery disease (HCC)    95-62% RICA, < 13% LICA 06/08/56 [name] ([facility])  . Cataract   . Chest pain syndrome  2004   with adenosine Cardiolite that was normal  . Chronic hoarseness   . COPD (chronic obstructive pulmonary disease) ([place])   . GERD (gastroesophageal reflux disease)   . Heart murmur    Mild AS, AI. AV pk grad 18.79 mmHg, mn grad 11.20 mmHg, AVA (VTI) 1.05 cm2 10/10/20, 3 year f/u rec ([facility])  . History of kidney stones   . History of renal calculi   . Hypercholesterolemia   . Hypertension   . Incontinence    Cough incontinence  . Mild depression ([place])   . Neuromuscular disorder (HCC)    neuropathy bilateral lower extremities  . Osteopenia   . Peripheral vascular disease ([place])    with femoral and carotid bruits  . PONV (postoperative nausea and vomiting)   . Tobacco dependence   . Vitamin D deficiency     Patient Active Problem List   Diagnosis Date Noted  . Acute pancreatitis 11/17/2020  . COPD (chronic obstructive pulmonary disease) ([place])   . Hypertension   . 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    Past Surgical History:  Procedure Laterality Date  . APPENDECTOMY    . BREAST BIOPSY    . CATARACT EXTRACTION, BILATERAL    . COLON SURGERY     2009  . COSMETIC SURGERY  on neck and eyes  . DILATION AND CURETTAGE OF UTERUS    . EYE SURGERY     2014  . FEMUR SURGERY  12/2007   left femur surgery--for femoral neck fracture status post fall   . FRACTURE SURGERY     broke left leg and left arm- 2012  . LIPOSUCTION    . LUMBAR LAMINECTOMY/DECOMPRESSION MICRODISCECTOMY N/A 11/12/2020   Procedure: LAMINECTOMY AND FORAMINOTOMY Lumbar three four, Lumbar four five, Lumbar Five Sacral one;  Surgeon: Newman Pies, MD;  Location: [facility];  Service: Neurosurgery;  Laterality: N/A;  . OTHER SURGICAL HISTORY     sigmoid colectomy  . SHOULDER SURGERY     arthroscopic shoulder surgery   . TONSILLECTOMY    . TUBAL LIGATION       OB History   No obstetric history on file.     Family History  Problem Relation Age of Onset  . Heart  disease Mother   . Stroke Mother     Social History   Tobacco Use  . Smoking status: Current Every Day Smoker    Packs/day: 0.50    Years: 60.00    Pack years: 30.00    Types: Cigarettes  . Smokeless tobacco: Never Used  Vaping Use  . Vaping Use: Never used  Substance Use Topics  . Alcohol use: Yes    Alcohol/week: 0.0 standard drinks    Comment: Occ glass of wine  . Drug use: No    Home Medications Prior to Admission medications   Medication Sig Start Date End Date Taking? Authorizing Provider  albuterol (VENTOLIN HFA) 108 (90 Base) MCG/ACT inhaler Inhale 2 puffs into the lungs every 6 (six) hours as needed for wheezing or shortness of breath. 11/21/20   Rai, Ripudeep K, MD  amLODipine (NORVASC) 5 MG tablet Take 5 mg by mouth daily. 02/14/20   [provider]  aspirin 81 MG tablet Take 81 mg by mouth every evening.     [provider]  Besifloxacin HCl (BESIVANCE) 0.6 % SUSP Place 1 drop into both eyes in the morning and at bedtime.    [provider]  cloNIDine (CATAPRES) 0.1 MG tablet Take 0.1 mg by mouth at bedtime.     [provider]  Coenzyme Q10 100 MG capsule Take 100 mg by mouth daily.    [provider]  cycloSPORINE (RESTASIS) 0.05 % ophthalmic emulsion Place 1 drop into both eyes 2 (two) times daily.    [provider]  docusate sodium (COLACE) 100 MG capsule Take 1 capsule (100 mg total) by mouth 2 (two) times daily. Patient taking differently: Take 100 mg by mouth 2 (two) times daily as needed for mild constipation. 11/12/20   Newman Pies, MD  famotidine (PEPCID) 20 MG tablet Take 20 mg by mouth every other day.     [provider]  gabapentin (NEURONTIN) 100 MG capsule Take 2 capsules (200 mg total) by mouth at bedtime. PATIENT NEEDS OFFICE VISIT FOR ADDITIONAL REFILLS 07/30/15   [name], MD  HYDROcodone-acetaminophen (NORCO/VICODIN) 5-325 MG tablet Take 1 tablet by mouth every 4 (four) hours as  needed. 12/20/20   Isla Pence, MD  INCRUSE ELLIPTA 62.5 MCG/INH AEPB Inhale 1 puff into the lungs daily. 11/21/20   Rai, Vernelle Emerald, MD  irbesartan-hydrochlorothiazide (AVALIDE) 150-12.5 MG per tablet Take 1 tablet by mouth daily.    [provider]  metoprolol succinate (TOPROL-XL) 25 MG 24 hr tablet Take 25 mg by mouth every evening.  [provider]  Multiple Vitamin (MULTIVITAMIN) tablet Take 1 tablet by mouth daily.    [provider]  ondansetron (ZOFRAN ODT) 4 MG disintegrating tablet Take 1 tablet (4 mg total) by mouth every 8 (eight) hours as needed for nausea or vomiting. 11/21/20   Rai, Vernelle Emerald, MD  simvastatin (ZOCOR) 40 MG tablet Take 40 mg by mouth every evening.     [provider]  Turmeric 500 MG CAPS Take 500 mg by mouth daily.    [provider]  vitamin B-12 (CYANOCOBALAMIN) 100 MCG tablet Take 100 mcg by mouth daily.    [provider]  vitamin E 180 MG (400 UNITS) capsule Take 400 Units by mouth daily.    [provider]    Allergies    Other, Pollen extract, Tegretol [carbamazepine], Lisinopril, and Tetracyclines & related  Review of Systems   Review of Systems  Gastrointestinal: Positive for abdominal pain.  All other systems reviewed and are negative.   Physical Exam Updated Vital Signs BP (!) 111/48 (BP Location: Right Arm)   Pulse (!) 102   Temp 99.9 F (37.7 C) (Oral)   Resp 20   SpO2 94%   Physical Exam Vitals and nursing note reviewed.  Constitutional:      General: She is not in acute distress.    Appearance: She is well-developed.     Comments: Patient keeps her eyes closed but does answer questions in short 1-2 word sentences  HENT:     Head: Normocephalic and atraumatic.     Mouth/Throat:     Mouth: Mucous membranes are dry.  Eyes:     Pupils: Pupils are equal, round, and reactive to light.  Cardiovascular:     Rate and Rhythm: Regular rhythm. Tachycardia present.      Heart sounds: Normal heart sounds. No murmur heard. No friction rub.  Pulmonary:     Effort: Pulmonary effort is normal.     Breath sounds: Normal breath sounds. No wheezing or rales.  Abdominal:     General: Bowel sounds are normal. There is no distension.     Palpations: Abdomen is soft.     Tenderness: There is no abdominal tenderness. There is no guarding or rebound.  Musculoskeletal:        General: No tenderness. Normal range of motion.     Right lower leg: No edema.     Left lower leg: No edema.     Comments: No edema  Skin:    General: Skin is warm and dry.     Capillary Refill: Capillary refill takes 2 to 3 seconds.     Findings: No rash.  Neurological:     Mental Status: She is alert.     Cranial Nerves: No cranial nerve deficit.     Comments: Oriented to self.  Able to move all extremities.  Sensation is intact.  Psychiatric:     Comments: Cooperative     ED Results / Procedures / Treatments   Labs (all labs ordered are listed, but only abnormal results are displayed) Labs Reviewed  LACTIC ACID, PLASMA - Abnormal; Notable for the following components:      Result Value   Lactic Acid, Venous 2.0 (*)    All other components within normal limits  COMPREHENSIVE METABOLIC PANEL - Abnormal; Notable for the following components:   Sodium 131 (*)    Glucose, Bld 125 (*)    Creatinine, Ser 1.13 (*)    Calcium 8.2 (*)  Total Protein 5.4 (*)    Albumin 2.8 (*)    AST 49 (*)    Total Bilirubin 1.7 (*)    GFR, Estimated 49 (*)    All other components within normal limits  CBC WITH DIFFERENTIAL/PLATELET - Abnormal; Notable for the following components:   WBC 15.7 (*)    RBC 3.71 (*)    Hemoglobin 11.4 (*)    HCT 34.3 (*)    Neutro Abs 13.4 (*)    Abs Immature Granulocytes 0.14 (*)    All other components within normal limits  PROTIME-INR - Abnormal; Notable for the following components:   Prothrombin Time 15.5 (*)    All other components within normal limits   RESP PANEL BY RT-PCR (FLU A&B, COVID) ARPGX2  CULTURE, BLOOD (ROUTINE X 2)  CULTURE, BLOOD (ROUTINE X 2)  URINE CULTURE  APTT  LACTIC ACID, PLASMA  URINALYSIS, ROUTINE W REFLEX MICROSCOPIC    EKG EKG Interpretation  Date/Time:  Thursday April 04 2021 17:24:27 EDT Ventricular Rate:  101 PR Interval:  181 QRS Duration: 95 QT Interval:  352 QTC Calculation: 457 R Axis:   16 Text Interpretation: Sinus tachycardia Biatrial enlargement No significant change since last tracing Confirmed by Blanchie Dessert ([id number]) on 04/04/2021 6:08:44 PM   Radiology DG Chest Port 1 View  Result Date: 04/04/2021 CLINICAL DATA:  Questionable sepsis EXAM: PORTABLE CHEST 1 VIEW COMPARISON:  11/20/2020 FINDINGS: Airspace disease in the right upper lobe concerning for pneumonia. Left lung clear. Heart is normal size. No effusions or acute bony abnormality. Aortic atherosclerosis. IMPRESSION: Right upper lobe airspace opacity concerning for pneumonia. Electronically Signed   By: Rolm Baptise M.D.   On: 04/04/2021 17:15    Procedures Procedures   Medications Ordered in ED Medications  lactated ringers infusion (has no administration in time range)  lactated ringers bolus 1,000 mL (has no administration in time range)  cefTRIAXone (ROCEPHIN) 1 g in sodium chloride 0.9 % 100 mL IVPB (has no administration in time range)    ED Course  I have reviewed the triage vital signs and the nursing notes.  Pertinent labs & imaging results that were available during my care of the patient were reviewed by me and considered in my medical decision making (see chart for details).    MDM Rules/Calculators/A&P                          Patient is an 80 year old female presenting today as a code sepsis.  Her temperature is 101.5 on exam she has diffuse myalgias, some diarrhea, nausea and just not feeling well.  She is tachycardic and oxygen saturation 94% on room air.  She does deny shortness of breath.  Concern for  COVID.  Patient has no localized abdominal pain concerning for diverticulitis or pancreatitis at this time.  She has had some diarrhea but denies any food associated illnesses.  Nobody else at home is ill.  Patient denies urinary symptoms.  Code sepsis work-up initiated.  Patient given IV fluids while we await first lactic acid.  Patient given 1 dose of Rocephin while waiting for further information.  6:09 PM Lactic acid today is 2, CMP with mild AKI with creatinine 1.13 and sodium of 131.  CBC with leukocytosis of 15,000 and stable hemoglobin of 11.  Blood cultures were drawn and PT/INR without significant findings.  Chest x-ray showing right upper lobe airspace opacity concerning for pneumonia.  EKG without acute findings.  Patient  treated with Rocephin and azithromycin.  She was given 1 L of fluid and started on a rate as she is not displaying signs of severe sepsis at this time and vital signs are normal.  COVID is pending.  MDM Number of Diagnoses or Management Options   Amount and/or Complexity of Data Reviewed Clinical lab tests: ordered and reviewed Tests in the radiology section of CPT: ordered and reviewed Tests in the medicine section of CPT: ordered and reviewed Decide to obtain previous medical records or to obtain history from someone other than the patient: yes Obtain history from someone other than the patient: yes Review and summarize past medical records: yes Discuss the patient with other providers: yes Independent visualization of images, tracings, or specimens: yes  Risk of Complications, Morbidity, and/or Mortality Presenting problems: moderate Diagnostic procedures: moderate Management options: moderate  Patient Progress Patient progress: stable   Final Clinical Impression(s) / ED Diagnoses Final diagnoses:  Community acquired pneumonia of right upper lobe of lung  Sepsis without acute organ dysfunction, due to unspecified organism [facility])    Rx / DC Orders ED  Discharge Orders    None       Blanchie Dessert, MD 04/04/21 1815

## 2021-04-04 NOTE — Progress Notes (Signed)
New Admission Note:  Arrival Method:Via stretcher from ER Mental Orientation:A&O x4  Telemetry:58m01 Assessment: Completed Skin:intact, redness to R heel  IV:R forearm & L AC  Pain:0/10 Safety Measures: Safety Fall Prevention Plan was given, discussed.   Orders have been reviewed and implemented. Will continue to monitor the patient. Call light has been placed within reach and bed alarm has been activated.   Arta Silence ,RN

## 2021-04-04 NOTE — H&P (Signed)
History and Physical   [name] WCH:[id number] DOB: 1941-04-28 DOA: 04/04/2021  Referring MD/NP/PA: Dr. Maryan Rued  PCP: [name], MD   Patient coming from: Home  Chief Complaint: Abdominal pain and weakness  HPI: Elizabeth [name] is a 80 y.o. female with medical history significant of COPD, hypertension, GERD, history of pancreatitis, carotid artery disease, IBS, osteoarthritis who presented to the ER with abdominal pain, nausea.  Not feeling well.  Patient also has some weakness as well as some shortness of breath.  Patient has been doing fine and went to bed early last night.  A family member went to check on her and she was found to be somnolent as well as weak.  Patient complained that she does not feel well.  She has not been herself so they called 911.  She was brought to the ER where she is still partially confused.  Family member at bedside.  Work-up indicated sepsis with right upper lobe pneumonia.  Patient is arousable but she falls back to sleep almost immediately.  She does not have any other significant complaint.  She is therefore being admitted to the hospital for further evaluation and treatment..  ED Course: Temperature is 100 blood pressure 100/48, pulse 102, respirate of 20 oxygen sat 91% on room air.  White count is 15.7 hemoglobin 11.4 and platelets of 163.  Sodium 131, potassium 4.0 chloride 100 CO2 23 BUN 19 creatinine 1.13 calcium 8.2 and glucose 125.  Lactic acid of 2.0.  PT 15.5 INR 1.2.  COVID-19 screen is negative.  Chest x-ray showed right upper lobe airspace opacity concerning for pneumonia.  Patient will be admitted to with a diagnosis of sepsis from leukocytosis, tachycardia.  Review of Systems: As per HPI otherwise 10 point review of systems negative.    Past Medical History:  Diagnosis Date  . Abdominal cramping    possibly irritable bowel syndrome versus adhesions secondary to multiple surgeries and abdominal inflammation and 2009 secondary to diverticulitis  and diverticular phlegmon  . Allergic rhinitis   . Arthritis   . Carotid artery disease (HCC)    35-36% RICA, < 14% LICA 43/1/54 [name] ([facility])  . Cataract   . Chest pain syndrome 2004   with adenosine Cardiolite that was normal  . Chronic hoarseness   . COPD (chronic obstructive pulmonary disease) ([place])   . GERD (gastroesophageal reflux disease)   . Heart murmur    Mild AS, AI. AV pk grad 18.79 mmHg, mn grad 11.20 mmHg, AVA (VTI) 1.05 cm2 10/10/20, 3 year f/u rec ([facility])  . History of kidney stones   . History of renal calculi   . Hypercholesterolemia   . Hypertension   . Incontinence    Cough incontinence  . Mild depression ([place])   . Neuromuscular disorder (HCC)    neuropathy bilateral lower extremities  . Osteopenia   . Peripheral vascular disease ([place])    with femoral and carotid bruits  . PONV (postoperative nausea and vomiting)   . Tobacco dependence   . Vitamin D deficiency     Past Surgical History:  Procedure Laterality Date  . APPENDECTOMY    . BREAST BIOPSY    . CATARACT EXTRACTION, BILATERAL    . COLON SURGERY     2009  . COSMETIC SURGERY     on neck and eyes  . DILATION AND CURETTAGE OF UTERUS    . EYE SURGERY     2014  . FEMUR SURGERY  12/2007  left femur surgery--for femoral neck fracture status post fall   . FRACTURE SURGERY     broke left leg and left arm- 2012  . LIPOSUCTION    . LUMBAR LAMINECTOMY/DECOMPRESSION MICRODISCECTOMY N/A 11/12/2020   Procedure: LAMINECTOMY AND FORAMINOTOMY Lumbar three four, Lumbar four five, Lumbar Five Sacral one;  Surgeon: Newman Pies, MD;  Location: [facility];  Service: Neurosurgery;  Laterality: N/A;  . OTHER SURGICAL HISTORY     sigmoid colectomy  . SHOULDER SURGERY     arthroscopic shoulder surgery   . TONSILLECTOMY    . TUBAL LIGATION       reports that she has been smoking cigarettes. She has a 30.00 pack-year smoking history. She has never used smokeless tobacco. She reports current alcohol use. She  reports that she does not use drugs.  Allergies  Allergen Reactions  . Other     Chlorexolone  . Pollen Extract     Allergic to tree pollens  . Tegretol [Carbamazepine]     Unknown   . Lisinopril Rash  . Tetracyclines & Related Rash    Family History  Problem Relation Age of Onset  . Heart disease Mother   . Stroke Mother      Prior to Admission medications   Medication Sig Start Date End Date Taking? Authorizing Provider  albuterol (VENTOLIN HFA) 108 (90 Base) MCG/ACT inhaler Inhale 2 puffs into the lungs every 6 (six) hours as needed for wheezing or shortness of breath. 11/21/20   Rai, Ripudeep K, MD  amLODipine (NORVASC) 5 MG tablet Take 5 mg by mouth daily. 02/14/20   [provider]  aspirin 81 MG tablet Take 81 mg by mouth every evening.     [provider]  Besifloxacin HCl (BESIVANCE) 0.6 % SUSP Place 1 drop into both eyes in the morning and at bedtime.    [provider]  cloNIDine (CATAPRES) 0.1 MG tablet Take 0.1 mg by mouth at bedtime.     [provider]  Coenzyme Q10 100 MG capsule Take 100 mg by mouth daily.    [provider]  cycloSPORINE (RESTASIS) 0.05 % ophthalmic emulsion Place 1 drop into both eyes 2 (two) times daily.    [provider]  docusate sodium (COLACE) 100 MG capsule Take 1 capsule (100 mg total) by mouth 2 (two) times daily. Patient taking differently: Take 100 mg by mouth 2 (two) times daily as needed for mild constipation. 11/12/20   Newman Pies, MD  famotidine (PEPCID) 20 MG tablet Take 20 mg by mouth every other day.     [provider]  gabapentin (NEURONTIN) 100 MG capsule Take 2 capsules (200 mg total) by mouth at bedtime. PATIENT NEEDS OFFICE VISIT FOR ADDITIONAL REFILLS 07/30/15   [name], MD  HYDROcodone-acetaminophen (NORCO/VICODIN) 5-325 MG tablet Take 1 tablet by mouth every 4 (four) hours as needed. 12/20/20   Isla Pence, MD  INCRUSE ELLIPTA 62.5 MCG/INH AEPB  Inhale 1 puff into the lungs daily. 11/21/20   Rai, Vernelle Emerald, MD  irbesartan-hydrochlorothiazide (AVALIDE) 150-12.5 MG per tablet Take 1 tablet by mouth daily.    [provider]  metoprolol succinate (TOPROL-XL) 25 MG 24 hr tablet Take 25 mg by mouth every evening.     [provider]  Multiple Vitamin (MULTIVITAMIN) tablet Take 1 tablet by mouth daily.    [provider]  ondansetron (ZOFRAN ODT) 4 MG disintegrating tablet Take 1 tablet (4 mg total) by mouth every 8 (eight) hours as  needed for nausea or vomiting. 11/21/20   Rai, Vernelle Emerald, MD  simvastatin (ZOCOR) 40 MG tablet Take 40 mg by mouth every evening.     [provider]  Turmeric 500 MG CAPS Take 500 mg by mouth daily.    [provider]  vitamin B-12 (CYANOCOBALAMIN) 100 MCG tablet Take 100 mcg by mouth daily.    [provider]  vitamin E 180 MG (400 UNITS) capsule Take 400 Units by mouth daily.    [provider]    Physical Exam: Vitals:   04/04/21 1745 04/04/21 1757 04/04/21 1800 04/04/21 1815  BP: (!) 124/57  (!) 117/50 (!) 109/52  Pulse: 97  98 98  Resp: 17  18 19   Temp:  100 F (37.8 C)    TempSrc:  Oral    SpO2: 96%  95% 91%      Constitutional: Acutely ill looking, drowsy, no distress Vitals:   04/04/21 1745 04/04/21 1757 04/04/21 1800 04/04/21 1815  BP: (!) 124/57  (!) 117/50 (!) 109/52  Pulse: 97  98 98  Resp: 17  18 19   Temp:  100 F (37.8 C)    TempSrc:  Oral    SpO2: 96%  95% 91%   Eyes: PERRL, lids and conjunctivae normal ENMT: Mucous membranes are dry. Posterior pharynx clear of any exudate or lesions.Normal dentition.  Neck: normal, supple, no masses, no thyromegaly Respiratory: clear to auscultation bilaterally, no wheezing, no crackles. Normal respiratory effort. No accessory muscle use.  Cardiovascular: Sinus tachycardia, no murmurs / rubs / gallops. No extremity edema. 2+ pedal pulses. No carotid bruits.  Abdomen: no tenderness,  no masses palpated. No hepatosplenomegaly. Bowel sounds positive.  Musculoskeletal: no clubbing / cyanosis. No joint deformity upper and lower extremities. Good ROM, no contractures. Normal muscle tone.  Skin: no rashes, lesions, ulcers. No induration Neurologic: CN 2-12 grossly intact. Sensation intact, DTR normal. Strength 5/5 in all 4.  Psychiatric: Drowsy, arousable, still communicates    Labs on Admission: I have personally reviewed following labs and imaging studies  CBC: Recent Labs  Lab 04/04/21 1658  WBC 15.7*  NEUTROABS 13.4*  HGB 11.4*  HCT 34.3*  MCV 92.5  PLT 825   Basic Metabolic Panel: Recent Labs  Lab 04/04/21 1658  NA 131*  K 4.0  CL 100  CO2 23  GLUCOSE 125*  BUN 19  CREATININE 1.13*  CALCIUM 8.2*   GFR: CrCl cannot be calculated (Unknown ideal weight.). Liver Function Tests: Recent Labs  Lab 04/04/21 1658  AST 49*  ALT 12  ALKPHOS 54  BILITOT 1.7*  PROT 5.4*  ALBUMIN 2.8*   No results for input(s): LIPASE, AMYLASE in the last 168 hours. No results for input(s): AMMONIA in the last 168 hours. Coagulation Profile: Recent Labs  Lab 04/04/21 1658  INR 1.2   Cardiac Enzymes: No results for input(s): CKTOTAL, CKMB, CKMBINDEX, TROPONINI in the last 168 hours. BNP (last 3 results) No results for input(s): PROBNP in the last 8760 hours. HbA1C: No results for input(s): HGBA1C in the last 72 hours. CBG: No results for input(s): GLUCAP in the last 168 hours. Lipid Profile: No results for input(s): CHOL, HDL, LDLCALC, TRIG, CHOLHDL, LDLDIRECT in the last 72 hours. Thyroid Function Tests: No results for input(s): TSH, T4TOTAL, FREET4, T3FREE, THYROIDAB in the last 72 hours. Anemia Panel: No results for input(s): VITAMINB12, FOLATE, FERRITIN, TIBC, IRON, RETICCTPCT in the last 72 hours. Urine analysis:    Component Value Date/Time   COLORURINE YELLOW  11/17/2020 0700   APPEARANCEUR CLEAR 11/17/2020 0700   LABSPEC 1.027 11/17/2020 0700    PHURINE 7.0 11/17/2020 0700   GLUCOSEU NEGATIVE 11/17/2020 0700   HGBUR NEGATIVE 11/17/2020 0700   BILIRUBINUR NEGATIVE 11/17/2020 0700   KETONESUR NEGATIVE 11/17/2020 0700   PROTEINUR NEGATIVE 11/17/2020 0700   NITRITE NEGATIVE 11/17/2020 0700   LEUKOCYTESUR NEGATIVE 11/17/2020 0700   Sepsis Labs: @LABRCNTIP (procalcitonin:4,lacticidven:4) ) Recent Results (from the past 240 hour(s))  Resp Panel by RT-PCR (Flu A&B, Covid) Nasopharyngeal Swab     Status: None   Collection Time: 04/04/21  5:16 PM   Specimen: Nasopharyngeal Swab; Nasopharyngeal(NP) swabs in vial transport medium  Result Value Ref Range Status   SARS Coronavirus 2 by RT PCR NEGATIVE NEGATIVE Final    Comment: (NOTE) SARS-CoV-2 target nucleic acids are NOT DETECTED.  The SARS-CoV-2 RNA is generally detectable in upper respiratory specimens during the acute phase of infection. The lowest concentration of SARS-CoV-2 viral copies this assay can detect is 138 copies/mL. A negative result does not preclude SARS-Cov-2 infection and should not be used as the sole basis for treatment or other patient management decisions. A negative result may occur with  improper specimen collection/handling, submission of specimen other than nasopharyngeal swab, presence of viral mutation(s) within the areas targeted by this assay, and inadequate number of viral copies(<138 copies/mL). A negative result must be combined with clinical observations, patient history, and epidemiological information. The expected result is Negative.  Fact Sheet for Patients:  EntrepreneurPulse.com.au  Fact Sheet for Healthcare Providers:  IncredibleEmployment.be  This test is no t yet approved or cleared by the [place] FDA and  has been authorized for detection and/or diagnosis of SARS-CoV-2 by FDA under an Emergency Use Authorization (EUA). This EUA will remain  in effect (meaning this test can be used) for the  duration of the COVID-19 declaration under Section 564(b)(1) of the Act, 21 U.S.C.section 360bbb-3(b)(1), unless the authorization is terminated  or revoked sooner.       Influenza A by PCR NEGATIVE NEGATIVE Final   Influenza B by PCR NEGATIVE NEGATIVE Final    Comment: (NOTE) The Xpert Xpress SARS-CoV-2/FLU/RSV plus assay is intended as an aid in the diagnosis of influenza from Nasopharyngeal swab specimens and should not be used as a sole basis for treatment. Nasal washings and aspirates are unacceptable for Xpert Xpress SARS-CoV-2/FLU/RSV testing.  Fact Sheet for Patients: EntrepreneurPulse.com.au  Fact Sheet for Healthcare Providers: IncredibleEmployment.be  This test is not yet approved or cleared by the [place] FDA and has been authorized for detection and/or diagnosis of SARS-CoV-2 by FDA under an Emergency Use Authorization (EUA). This EUA will remain in effect (meaning this test can be used) for the duration of the COVID-19 declaration under Section 564(b)(1) of the Act, 21 U.S.C. section 360bbb-3(b)(1), unless the authorization is terminated or revoked.  Performed at [facility], [place] [address] [place], [place] [address]      Radiological Exams on Admission: DG Chest Port 1 View  Result Date: 04/04/2021 CLINICAL DATA:  Questionable sepsis EXAM: PORTABLE CHEST 1 VIEW COMPARISON:  11/20/2020 FINDINGS: Airspace disease in the right upper lobe concerning for pneumonia. Left lung clear. Heart is normal size. No effusions or acute bony abnormality. Aortic atherosclerosis. IMPRESSION: Right upper lobe airspace opacity concerning for pneumonia. Electronically Signed   By: Rolm Baptise M.D.   On: 04/04/2021 17:15    EKG: Independently reviewed.  It shows sinus tachycardia with a rate of 101, biatrial enlargement no  significant change since last tracing.  Assessment/Plan Principal Problem:   Sepsis ([place]) Active Problems:    COPD (chronic obstructive pulmonary disease) ([place])   Hypertension   Community acquired pneumonia   Hyponatremia   Gastro-esophageal reflux disease without esophagitis   Mixed hyperlipidemia     #1 sepsis: Secondary to pneumonia.  Patient is still slightly confused.  Admit the patient.  Initiate IV Rocephin and Zithromax.  Sepsis protocol.  IV fluids and other resuscitative measures.  #2 community-acquired pneumonia: Continue IV antibiotics.  Blood and sputum cultures.  #3 COPD: No acute exacerbation.  On Incruse Ellipta.  Also empiric Ventolin.  Continue to monitor  #4 essential hypertension: We will confirm home regimen when medication reconciliation is completed.  Appears to be on metoprolol as well as ACEI.  Resume when confirmed.  #5 GERD: On Pepcid at home.  Continue with PPIs  #6 hyperlipidemia: Confirm on resume statin.  #7 hyponatremia: Mild.  Continue monitoring.  DVT prophylaxis: Lovenox Code Status: Full code Family Communication: Daughter at bedside Disposition Plan: Home Consults called: None Admission status: Inpatient  Severity of Illness: The appropriate patient status for this patient is INPATIENT. Inpatient status is judged to be reasonable and necessary in order to provide the required intensity of service to ensure the patient's safety. The patient's presenting symptoms, physical exam findings, and initial radiographic and laboratory data in the context of their chronic comorbidities is felt to place them at high risk for further clinical deterioration. Furthermore, it is not anticipated that the patient will be medically stable for discharge from the hospital within 2 midnights of admission. The following factors support the patient status of inpatient.   " The patient's presenting symptoms include weakness and altered mental status. " The worrisome physical exam findings include dry mucous membrane and coarse breath sounds. " The initial radiographic and  laboratory data are worrisome because of right upper lobe pneumonia and evidence of sepsis. " The chronic co-morbidities include hypertension.   * I certify that at the point of admission it is my clinical judgment that the patient will require inpatient hospital care spanning beyond 2 midnights from the point of admission due to high intensity of service, high risk for further deterioration and high frequency of surveillance required.Barbette Merino MD [facility] Pager [phone]  If 7PM-7AM, please contact night-coverage [email or web address] Password [facility]  04/04/2021, 6:55 PM

## 2021-04-04 NOTE — ED Triage Notes (Signed)
Pt arrives via EMS with complaints of abd pain and low grade fever. Pt has been resting on couch in pain since last night. Pt has hx of pancreatitis. Pt presents like this when she has a flare up. Pt denies taking any pain medications. BP 108/50, HR 106, 95% on room air, CBG 149.

## 2021-04-05 ENCOUNTER — Inpatient Hospital Stay (HOSPITAL_COMMUNITY): Payer: Medicare Other

## 2021-04-05 DIAGNOSIS — R1032 Left lower quadrant pain: Secondary | ICD-10-CM

## 2021-04-05 DIAGNOSIS — D72825 Bandemia: Secondary | ICD-10-CM

## 2021-04-05 DIAGNOSIS — J441 Chronic obstructive pulmonary disease with (acute) exacerbation: Secondary | ICD-10-CM

## 2021-04-05 DIAGNOSIS — A419 Sepsis, unspecified organism: Secondary | ICD-10-CM | POA: Diagnosis not present

## 2021-04-05 DIAGNOSIS — G9341 Metabolic encephalopathy: Secondary | ICD-10-CM

## 2021-04-05 DIAGNOSIS — I959 Hypotension, unspecified: Secondary | ICD-10-CM

## 2021-04-05 DIAGNOSIS — J189 Pneumonia, unspecified organism: Secondary | ICD-10-CM | POA: Diagnosis not present

## 2021-04-05 DIAGNOSIS — R531 Weakness: Secondary | ICD-10-CM

## 2021-04-05 DIAGNOSIS — R6520 Severe sepsis without septic shock: Secondary | ICD-10-CM

## 2021-04-05 LAB — CBC
HCT: 32.6 % — ABNORMAL LOW (ref 36.0–46.0)
Hemoglobin: 10.7 g/dL — ABNORMAL LOW (ref 12.0–15.0)
MCH: 30.2 pg (ref 26.0–34.0)
MCHC: 32.8 g/dL (ref 30.0–36.0)
MCV: 92.1 fL (ref 80.0–100.0)
Platelets: 137 10*3/uL — ABNORMAL LOW (ref 150–400)
RBC: 3.54 MIL/uL — ABNORMAL LOW (ref 3.87–5.11)
RDW: 13.6 % (ref 11.5–15.5)
WBC: 17.9 10*3/uL — ABNORMAL HIGH (ref 4.0–10.5)
nRBC: 0 % (ref 0.0–0.2)

## 2021-04-05 LAB — COMPREHENSIVE METABOLIC PANEL
ALT: 11 U/L (ref 0–44)
AST: 25 U/L (ref 15–41)
Albumin: 2.3 g/dL — ABNORMAL LOW (ref 3.5–5.0)
Alkaline Phosphatase: 47 U/L (ref 38–126)
Anion gap: 10 (ref 5–15)
BUN: 15 mg/dL (ref 8–23)
CO2: 23 mmol/L (ref 22–32)
Calcium: 8.1 mg/dL — ABNORMAL LOW (ref 8.9–10.3)
Chloride: 101 mmol/L (ref 98–111)
Creatinine, Ser: 0.87 mg/dL (ref 0.44–1.00)
GFR, Estimated: >60 mL/min (ref >60)
Glucose, Bld: 123 mg/dL — ABNORMAL HIGH (ref 70–99)
Potassium: 3.7 mmol/L (ref 3.5–5.1)
Sodium: 134 mmol/L — ABNORMAL LOW (ref 135–145)
Total Bilirubin: 0.7 mg/dL (ref 0.3–1.2)
Total Protein: 5.1 g/dL — ABNORMAL LOW (ref 6.5–8.1)

## 2021-04-05 LAB — URINALYSIS, ROUTINE W REFLEX MICROSCOPIC
Bacteria, UA: NONE SEEN
Bilirubin Urine: NEGATIVE
Glucose, UA: NEGATIVE mg/dL
Ketones, ur: NEGATIVE mg/dL
Leukocytes,Ua: NEGATIVE
Nitrite: NEGATIVE
Protein, ur: NEGATIVE mg/dL
Specific Gravity, Urine: 1.011 (ref 1.005–1.030)
pH: 7 (ref 5.0–8.0)

## 2021-04-05 LAB — PROCALCITONIN: Procalcitonin: 3.37 ng/mL

## 2021-04-05 LAB — PROTIME-INR
INR: 1.4 — ABNORMAL HIGH (ref 0.8–1.2)
Prothrombin Time: 16.8 seconds — ABNORMAL HIGH (ref 11.4–15.2)

## 2021-04-05 LAB — CORTISOL-AM, BLOOD: Cortisol - AM: 10.4 ug/dL (ref 6.7–22.6)

## 2021-04-05 MED ORDER — GUAIFENESIN ER 600 MG PO TB12
600.0000 mg | ORAL_TABLET | Freq: Two times a day (BID) | ORAL | Status: DC
  Administered 2021-04-05 – 2021-04-07 (×4): 600 mg via ORAL
  Filled 2021-04-05 (×4): qty 1

## 2021-04-05 MED ORDER — SODIUM CHLORIDE 0.9 % IV SOLN: INTRAVENOUS | Status: DC

## 2021-04-05 MED ORDER — MOMETASONE FURO-FORMOTEROL FUM 200-5 MCG/ACT IN AERO
2.0000 | INHALATION_SPRAY | Freq: Two times a day (BID) | RESPIRATORY_TRACT | Status: DC
  Administered 2021-04-06 – 2021-04-07 (×3): 2 via RESPIRATORY_TRACT
  Filled 2021-04-05: qty 8.8

## 2021-04-05 MED ORDER — SODIUM CHLORIDE 0.9 % IV SOLN
500.0000 mg | INTRAVENOUS | Status: DC
  Administered 2021-04-05: 500 mg via INTRAVENOUS
  Filled 2021-04-05 (×2): qty 500

## 2021-04-05 MED ORDER — IOHEXOL 9 MG/ML PO SOLN
ORAL | Status: AC
  Administered 2021-04-05: 500 mL
  Filled 2021-04-05: qty 1000

## 2021-04-05 MED ORDER — SODIUM CHLORIDE 0.9 % IV SOLN
2.0000 g | INTRAVENOUS | Status: DC
  Administered 2021-04-05 – 2021-04-06 (×2): 2 g via INTRAVENOUS
  Filled 2021-04-05 (×2): qty 20

## 2021-04-05 MED ORDER — IPRATROPIUM-ALBUTEROL 0.5-2.5 (3) MG/3ML IN SOLN: 3.0000 mL | RESPIRATORY_TRACT | Status: DC | PRN

## 2021-04-05 MED ORDER — UMECLIDINIUM BROMIDE 62.5 MCG/INH IN AEPB
1.0000 | INHALATION_SPRAY | Freq: Every day | RESPIRATORY_TRACT | Status: DC
  Administered 2021-04-07: 1 via RESPIRATORY_TRACT
  Filled 2021-04-05: qty 7

## 2021-04-05 MED ORDER — GUAIFENESIN-DM 100-10 MG/5ML PO SYRP: 5.0000 mL | ORAL_SOLUTION | ORAL | Status: DC | PRN

## 2021-04-05 MED ORDER — HYDROCODONE-ACETAMINOPHEN 5-325 MG PO TABS
1.0000 | ORAL_TABLET | Freq: Four times a day (QID) | ORAL | Status: DC | PRN
  Administered 2021-04-05 – 2021-04-06 (×2): 1 via ORAL
  Filled 2021-04-05 (×2): qty 1

## 2021-04-05 NOTE — Plan of Care (Signed)
  Problem: Education: Goal: Knowledge of General Education information will improve Description Including pain rating scale, medication(s)/side effects and non-pharmacologic comfort measures Outcome: Progressing   

## 2021-04-05 NOTE — Progress Notes (Signed)
OT Cancellation Note  Patient Details Name: Elizabeth [name] MRN: [id number] DOB: 1941/08/13   Cancelled Treatment:    Reason Eval/Treat Not Completed: Other (comment) (Not feeling well. Will assess later time.)  [facility] Ziare Orrick, OT/L   Acute OT Clinical Specialist Acute Rehabilitation Services Pager [phone] Office [phone]  04/05/2021, 1:11 PM

## 2021-04-05 NOTE — Progress Notes (Signed)
PROGRESS NOTE  Elizabeth [name] PNT:[id number] DOB: 10-11-1941   PCP: [name], MD  Patient is from: Home.  Independent at baseline.  DOA: 04/04/2021 LOS: 1  Chief complaints:  Chief Complaint  Patient presents with  . Abdominal Pain    Brief Narrative / Interim history: 80 year old F with PMH of COPD, pancreatitis, HTN, GERD, sigmoid colectomy and IBS presenting with nausea, abdominal pain, generalized weakness, shortness of breath and altered mental status, and admitted for sepsis due to RUL pneumonia as noted on CXR.  Cultures drawn.  Patient was started on IV ceftriaxone and azithromycin.  On further review, patient meets severe sepsis criteria given lactic acidosis and encephalopathy on presentation.   Subjective: Seen and examined earlier this morning.  Reports feeling "terrible".  When asked to clarify, she reports feeling weak.  Also complains pain across lower abdomen, that she describes as cramping and soreness.  Also complains headache, shortness of breath, stuffy nose and dry cough.   Objective: Vitals:   04/04/21 2304 04/05/21 0637 04/05/21 0730 04/05/21 1111  BP:  (!) 113/48 (!) 112/47 (!) 121/51  Pulse:  78 80 75  Resp:  15 15 17   Temp:  98.3 F (36.8 C) 98 F (36.7 C) 98.4 F (36.9 C)  TempSrc:  Oral Oral Oral  SpO2:  95% 91% 95%  Weight: 52 kg       Intake/Output Summary (Last 24 hours) at 04/05/2021 1453 Last data filed at 04/05/2021 1250 Gross per 24 hour  Intake 3314.2 ml  Output 1000 ml  Net 2314.2 ml   Filed Weights   04/04/21 2304  Weight: 52 kg    Examination:  GENERAL: No apparent distress.  Nontoxic. HEENT: MMM.  Vision and hearing grossly intact.  NECK: Supple.  No apparent JVD.  RESP: 94% on 2 L.  No IWOB.  Crackles in right lung. CVS:  RRR. Heart sounds normal.  ABD/GI/GU: BS+. Abd soft.  Tenderness across lower abdomen mainly over LLQ. MSK/EXT:  Moves extremities. No apparent deformity. No edema.  SKIN: no apparent skin lesion or  wound NEURO: Awake, alert and oriented appropriately.  No apparent focal neuro deficit. PSYCH: Calm. Normal affect.   Procedures:  None   Microbiology summarized: [id number] and influenza PCR nonreactive. Blood cultures NGTD Urine culture pending  Assessment & Plan: Severe sepsis due to RUL pneumonia: POA: Heart tachycardia, leukocytosis, lactic acidosis and encephalopathy on presentation.  CXR concerning for RUL pneumonia.  Procalcitonin elevated to 3.37.  Blood cultures NGTD. -Continue IV ceftriaxone and azithromycin -Continue gentle IV fluid -Incentive spirometry, nebulizers/inhalers -Nebulizers  Mild COPD exacerbation: Likely due to the above. -Treatment as above -Will hold of systemic steroid unless worse.  Hypotension in patient with history of essential hypertension: Likely due to the above.  Improved. -Continue IV fluid -Hold home antihypertensive medications  Acute metabolic encephalopathy-likely due to #1.  She is also on Norco and gabapentin at home which could contribute.  Seems to have resolved. -Treat treatable causes -Minimize sedating medications  Abdominal pain-has history of GERD, IBS and likely diverticulitis with sigmoid colectomy.  She has LLQ tenderness. -Check CT abdomen and pelvis with oral contrast.  IV contrast on national shortage.  GERD -Continue PPI  Hyperlipidemia -Continue statin  Hyponatremia: Improved.  Likely due to Avalide -Continue IVF -Hold Avalide  Generalized weakness: Likely due to acute illness. -PT/OT  Home medications -Requested pharmacy for med recs.  Ordered essential on appropriate medications for now.  Body mass index is 21.66 kg/m.  DVT prophylaxis:  enoxaparin (LOVENOX) injection 30 mg Start: 04/04/21 1945  Code Status: Full code Family Communication: Patient and/or RN. Available if any question.  Level of care: Telemetry Medical Status is: Inpatient  Remains inpatient appropriate  because:Hemodynamically unstable, Ongoing diagnostic testing needed not appropriate for outpatient work up, IV treatments appropriate due to intensity of illness or inability to take PO and Inpatient level of care appropriate due to severity of illness   Dispo: The patient is from: Home              Anticipated d/c is to: Home              Patient currently is not medically stable to d/c.   Difficult to place patient No       Consultants:  None   Sch Meds:  Scheduled Meds: . enoxaparin (LOVENOX) injection  30 mg Subcutaneous Q24H   Continuous Infusions: . sodium chloride 100 mL/hr at 04/05/21 0827  . azithromycin    . cefTRIAXone (ROCEPHIN)  IV     PRN Meds:.acetaminophen **OR** acetaminophen, ondansetron **OR** ondansetron (ZOFRAN) IV  Antimicrobials: Anti-infectives (From admission, onward)   Start     Dose/Rate Route Frequency Ordered Stop   04/05/21 1630  azithromycin (ZITHROMAX) 500 mg in sodium chloride 0.9 % 250 mL IVPB        500 mg 250 mL/hr over 60 Minutes Intravenous Every 24 hours 04/05/21 0718 04/10/21 1629   04/05/21 1600  cefTRIAXone (ROCEPHIN) 2 g in sodium chloride 0.9 % 100 mL IVPB  Status:  Discontinued        2 g 200 mL/hr over 30 Minutes Intravenous Every 24 hours 04/04/21 2002 04/04/21 2005   04/05/21 1600  azithromycin (ZITHROMAX) 500 mg in sodium chloride 0.9 % 250 mL IVPB  Status:  Discontinued        500 mg 250 mL/hr over 60 Minutes Intravenous Every 24 hours 04/04/21 2002 04/04/21 2005   04/05/21 1600  cefTRIAXone (ROCEPHIN) 2 g in sodium chloride 0.9 % 100 mL IVPB  Status:  Discontinued        2 g 200 mL/hr over 30 Minutes Intravenous  Once 04/04/21 2006 04/05/21 0718   04/05/21 1600  azithromycin (ZITHROMAX) 500 mg in sodium chloride 0.9 % 250 mL IVPB  Status:  Discontinued        500 mg 250 mL/hr over 60 Minutes Intravenous  Once 04/04/21 2006 04/05/21 0718   04/05/21 1600  cefTRIAXone (ROCEPHIN) 2 g in sodium chloride 0.9 % 100 mL IVPB         2 g 200 mL/hr over 30 Minutes Intravenous Every 24 hours 04/05/21 0718 04/10/21 1559   04/04/21 2015  cefTRIAXone (ROCEPHIN) 1 g in sodium chloride 0.9 % 100 mL IVPB        1 g 200 mL/hr over 30 Minutes Intravenous  Once 04/04/21 2002 04/04/21 2122   04/04/21 1945  cefTRIAXone (ROCEPHIN) 2 g in sodium chloride 0.9 % 100 mL IVPB  Status:  Discontinued        2 g 200 mL/hr over 30 Minutes Intravenous Every 24 hours 04/04/21 1939 04/04/21 2002   04/04/21 1945  azithromycin (ZITHROMAX) 500 mg in sodium chloride 0.9 % 250 mL IVPB  Status:  Discontinued        500 mg 250 mL/hr over 60 Minutes Intravenous Every 24 hours 04/04/21 1939 04/04/21 2002   04/04/21 1730  azithromycin (ZITHROMAX) 500 mg in sodium chloride 0.9 % 250 mL IVPB  500 mg 250 mL/hr over 60 Minutes Intravenous  Once 04/04/21 1718 04/04/21 1910   04/04/21 1700  cefTRIAXone (ROCEPHIN) 1 g in sodium chloride 0.9 % 100 mL IVPB        1 g 200 mL/hr over 30 Minutes Intravenous  Once 04/04/21 1658 04/04/21 1759       I have personally reviewed the following labs and images: CBC: Recent Labs  Lab 04/04/21 1658 04/05/21 0345  WBC 15.7* 17.9*  NEUTROABS 13.4*  --   HGB 11.4* 10.7*  HCT 34.3* 32.6*  MCV 92.5 92.1  PLT 162 137*   BMP &GFR Recent Labs  Lab 04/04/21 1658 04/05/21 0345  NA 131* 134*  K 4.0 3.7  CL 100 101  CO2 23 23  GLUCOSE 125* 123*  BUN 19 15  CREATININE 1.13* 0.87  CALCIUM 8.2* 8.1*   Estimated Creatinine Clearance: 38.9 mL/min (by C-G formula based on SCr of 0.87 mg/dL). Liver & Pancreas: Recent Labs  Lab 04/04/21 1658 04/05/21 0345  AST 49* 25  ALT 12 11  ALKPHOS 54 47  BILITOT 1.7* 0.7  PROT 5.4* 5.1*  ALBUMIN 2.8* 2.3*   No results for input(s): LIPASE, AMYLASE in the last 168 hours. No results for input(s): AMMONIA in the last 168 hours. Diabetic: No results for input(s): HGBA1C in the last 72 hours. No results for input(s): GLUCAP in the last 168 hours. Cardiac  Enzymes: No results for input(s): CKTOTAL, CKMB, CKMBINDEX, TROPONINI in the last 168 hours. No results for input(s): PROBNP in the last 8760 hours. Coagulation Profile: Recent Labs  Lab 04/04/21 1658 04/05/21 0345  INR 1.2 1.4*   Thyroid Function Tests: No results for input(s): TSH, T4TOTAL, FREET4, T3FREE, THYROIDAB in the last 72 hours. Lipid Profile: No results for input(s): CHOL, HDL, LDLCALC, TRIG, CHOLHDL, LDLDIRECT in the last 72 hours. Anemia Panel: No results for input(s): VITAMINB12, FOLATE, FERRITIN, TIBC, IRON, RETICCTPCT in the last 72 hours. Urine analysis:    Component Value Date/Time   COLORURINE YELLOW 04/05/2021 0706   APPEARANCEUR CLEAR 04/05/2021 0706   LABSPEC 1.011 04/05/2021 0706   PHURINE 7.0 04/05/2021 0706   GLUCOSEU NEGATIVE 04/05/2021 0706   HGBUR SMALL (A) 04/05/2021 0706   BILIRUBINUR NEGATIVE 04/05/2021 0706   KETONESUR NEGATIVE 04/05/2021 0706   PROTEINUR NEGATIVE 04/05/2021 0706   NITRITE NEGATIVE 04/05/2021 0706   LEUKOCYTESUR NEGATIVE 04/05/2021 0706   Sepsis Labs: Invalid input(s): PROCALCITONIN, [place]  Microbiology: Recent Results (from the past 240 hour(s))  Resp Panel by RT-PCR (Flu A&B, Covid) Nasopharyngeal Swab     Status: None   Collection Time: 04/04/21  5:16 PM   Specimen: Nasopharyngeal Swab; Nasopharyngeal(NP) swabs in vial transport medium  Result Value Ref Range Status   SARS Coronavirus 2 by RT PCR NEGATIVE NEGATIVE Final    Comment: (NOTE) SARS-CoV-2 target nucleic acids are NOT DETECTED.  The SARS-CoV-2 RNA is generally detectable in upper respiratory specimens during the acute phase of infection. The lowest concentration of SARS-CoV-2 viral copies this assay can detect is 138 copies/mL. A negative result does not preclude SARS-Cov-2 infection and should not be used as the sole basis for treatment or other patient management decisions. A negative result may occur with  improper specimen collection/handling,  submission of specimen other than nasopharyngeal swab, presence of viral mutation(s) within the areas targeted by this assay, and inadequate number of viral copies(<138 copies/mL). A negative result must be combined with clinical observations, patient history, and epidemiological information. The expected result is Negative.  Fact Sheet for Patients:  EntrepreneurPulse.com.au  Fact Sheet for Healthcare Providers:  IncredibleEmployment.be  This test is no t yet approved or cleared by the [place] FDA and  has been authorized for detection and/or diagnosis of SARS-CoV-2 by FDA under an Emergency Use Authorization (EUA). This EUA will remain  in effect (meaning this test can be used) for the duration of the COVID-19 declaration under Section 564(b)(1) of the Act, 21 U.S.C.section 360bbb-3(b)(1), unless the authorization is terminated  or revoked sooner.       Influenza A by PCR NEGATIVE NEGATIVE Final   Influenza B by PCR NEGATIVE NEGATIVE Final    Comment: (NOTE) The Xpert Xpress SARS-CoV-2/FLU/RSV plus assay is intended as an aid in the diagnosis of influenza from Nasopharyngeal swab specimens and should not be used as a sole basis for treatment. Nasal washings and aspirates are unacceptable for Xpert Xpress SARS-CoV-2/FLU/RSV testing.  Fact Sheet for Patients: EntrepreneurPulse.com.au  Fact Sheet for Healthcare Providers: IncredibleEmployment.be  This test is not yet approved or cleared by the [place] FDA and has been authorized for detection and/or diagnosis of SARS-CoV-2 by FDA under an Emergency Use Authorization (EUA). This EUA will remain in effect (meaning this test can be used) for the duration of the COVID-19 declaration under Section 564(b)(1) of the Act, 21 U.S.C. section 360bbb-3(b)(1), unless the authorization is terminated or revoked.  Performed at [facility], [place] [address] [place], [place] [address]   Blood Culture (routine x 2)     Status: None (Preliminary result)   Collection Time: 04/04/21  5:19 PM   Specimen: BLOOD RIGHT FOREARM  Result Value Ref Range Status   Specimen Description BLOOD RIGHT FOREARM  Final   Special Requests   Final    BOTTLES DRAWN AEROBIC AND ANAEROBIC Blood Culture adequate volume   Culture   Final    NO GROWTH < 12 HOURS Performed at [facility], [place] [address], [place], [place] [address]    Report Status PENDING  Incomplete  Blood Culture (routine x 2)     Status: None (Preliminary result)   Collection Time: 04/04/21  6:05 PM   Specimen: BLOOD LEFT FOREARM  Result Value Ref Range Status   Specimen Description BLOOD LEFT FOREARM  Final   Special Requests   Final    BOTTLES DRAWN AEROBIC AND ANAEROBIC Blood Culture results may not be optimal due to an inadequate volume of blood received in culture bottles   Culture   Final    NO GROWTH < 12 HOURS Performed at [facility], [place] [address], [place], [place] [address]    Report Status PENDING  Incomplete    Radiology Studies: DG Chest Port 1 View  Result Date: 04/04/2021 CLINICAL DATA:  Questionable sepsis EXAM: PORTABLE CHEST 1 VIEW COMPARISON:  11/20/2020 FINDINGS: Airspace disease in the right upper lobe concerning for pneumonia. Left lung clear. Heart is normal size. No effusions or acute bony abnormality. Aortic atherosclerosis. IMPRESSION: Right upper lobe airspace opacity concerning for pneumonia. Electronically Signed   By: Rolm Baptise M.D.   On: 04/04/2021 17:15      Kema Santaella T. Omao  If 7PM-7AM, please contact night-coverage [email or web address] 04/05/2021, 2:53 PM

## 2021-04-05 NOTE — Evaluation (Signed)
Physical Therapy Evaluation Patient Details Name: Elizabeth [name] MRN: [id number] DOB: 05-Jan-1941 Today's Date: 04/05/2021   History of Present Illness  Pt adm 6/2 with abdominal pain, weakness, and SOB. Pt found to have sepsis with PNA. PMH - copd, htn, CAD, IBS, osteoarthritis, PVD, pancreatitis, lumbar laminectomy  Clinical Impression  Pt presents to PT with decr mobility due primarily to malaise and nausea. Expect pt's mobility will improve significantly when she feels better. Will follow acutely but doubt will need PT after DC.     Follow Up Recommendations No PT follow up    Equipment Recommendations  None recommended by PT    Recommendations for Other Services       Precautions / Restrictions        Mobility  Bed Mobility Overal bed mobility: Independent                  Transfers Overall transfer level: Needs assistance Equipment used: None Transfers: Sit to/from Stand Sit to Stand: Supervision         General transfer comment: Assist for safety and lines  Ambulation/Gait             General Gait Details: Unable to attempt due to nausea and feeling bad  Stairs            Wheelchair Mobility    Modified Rankin (Stroke Patients Only)       Balance Overall balance assessment: Mild deficits observed, not formally tested                                           Pertinent Vitals/Pain Pain Assessment: No/denies pain    Home Living Family/patient expects to be discharged to:: Private residence Living Arrangements: Alone (son and granddaughter live in houses on same property) Available Help at Discharge: Available PRN/intermittently;Family Type of Home: House Home Access: Level entry     Home Layout: One level Home Equipment: [place] - 2 wheels;Cane - single point;Shower seat - built in;Wheelchair - manual      Prior Function Level of Independence: Independent               Hand Dominance         Extremity/Trunk Assessment   Upper Extremity Assessment Upper Extremity Assessment: Defer to OT evaluation    Lower Extremity Assessment Lower Extremity Assessment: Overall WFL for tasks assessed       Communication   Communication: No difficulties  Cognition Arousal/Alertness: Awake/alert Behavior During Therapy: WFL for tasks assessed/performed Overall Cognitive Status: Within Functional Limits for tasks assessed                                        General Comments General comments (skin integrity, edema, etc.): Pt on 2L with SpO2 96%. Removed O2 with SpO2 95%    Exercises     Assessment/Plan    PT Assessment Patient needs continued PT services  PT Problem List Decreased activity tolerance;Decreased mobility       PT Treatment Interventions DME instruction;Gait training;Functional mobility training;Therapeutic activities;Therapeutic exercise;Patient/family education    PT Goals (Current goals can be found in the Care Plan section)  Acute Rehab PT Goals Patient Stated Goal: feel better PT Goal Formulation: With patient Time For Goal Achievement: 04/12/21 Potential to Achieve Goals:  Good    Frequency Min 3X/week   Barriers to discharge        Co-evaluation               AM-PAC PT "6 Clicks" Mobility  Outcome Measure Help needed turning from your back to your side while in a flat bed without using bedrails?: None Help needed moving from lying on your back to sitting on the side of a flat bed without using bedrails?: None Help needed moving to and from a bed to a chair (including a wheelchair)?: A Little Help needed standing up from a chair using your arms (e.g., wheelchair or bedside chair)?: A Little Help needed to walk in hospital room?: A Little Help needed climbing 3-5 steps with a railing? : A Little 6 Click Score: 20    End of Session   Activity Tolerance: Other (comment);Patient limited by fatigue (limited by  nausea) Patient left: in bed;with call bell/phone within reach;with bed alarm set Nurse Communication: Mobility status PT Visit Diagnosis: Other abnormalities of gait and mobility (R26.89)    Time: 0762-2633 PT Time Calculation (min) (ACUTE ONLY): 17 min   Charges:   PT Evaluation $PT Eval Moderate Complexity: [place] Pager [phone] Office [place] 04/05/2021, 9:50 AM

## 2021-04-06 DIAGNOSIS — R1032 Left lower quadrant pain: Secondary | ICD-10-CM | POA: Diagnosis not present

## 2021-04-06 DIAGNOSIS — J189 Pneumonia, unspecified organism: Secondary | ICD-10-CM | POA: Diagnosis not present

## 2021-04-06 DIAGNOSIS — J441 Chronic obstructive pulmonary disease with (acute) exacerbation: Secondary | ICD-10-CM | POA: Diagnosis not present

## 2021-04-06 DIAGNOSIS — A419 Sepsis, unspecified organism: Secondary | ICD-10-CM | POA: Diagnosis not present

## 2021-04-06 DIAGNOSIS — J9601 Acute respiratory failure with hypoxia: Secondary | ICD-10-CM

## 2021-04-06 DIAGNOSIS — E876 Hypokalemia: Secondary | ICD-10-CM

## 2021-04-06 LAB — MAGNESIUM: Magnesium: 1.5 mg/dL — ABNORMAL LOW (ref 1.7–2.4)

## 2021-04-06 LAB — RENAL FUNCTION PANEL
Albumin: 2.2 g/dL — ABNORMAL LOW (ref 3.5–5.0)
Anion gap: 7 (ref 5–15)
BUN: 9 mg/dL (ref 8–23)
CO2: 23 mmol/L (ref 22–32)
Calcium: 7.9 mg/dL — ABNORMAL LOW (ref 8.9–10.3)
Chloride: 104 mmol/L (ref 98–111)
Creatinine, Ser: 0.66 mg/dL (ref 0.44–1.00)
GFR, Estimated: >60 mL/min (ref >60)
Glucose, Bld: 92 mg/dL (ref 70–99)
Phosphorus: 2.7 mg/dL (ref 2.5–4.6)
Potassium: 3.3 mmol/L — ABNORMAL LOW (ref 3.5–5.1)
Sodium: 134 mmol/L — ABNORMAL LOW (ref 135–145)

## 2021-04-06 LAB — CBC
HCT: 31.4 % — ABNORMAL LOW (ref 36.0–46.0)
Hemoglobin: 10.4 g/dL — ABNORMAL LOW (ref 12.0–15.0)
MCH: 30.7 pg (ref 26.0–34.0)
MCHC: 33.1 g/dL (ref 30.0–36.0)
MCV: 92.6 fL (ref 80.0–100.0)
Platelets: 141 10*3/uL — ABNORMAL LOW (ref 150–400)
RBC: 3.39 MIL/uL — ABNORMAL LOW (ref 3.87–5.11)
RDW: 13.6 % (ref 11.5–15.5)
WBC: 13.8 10*3/uL — ABNORMAL HIGH (ref 4.0–10.5)
nRBC: 0 % (ref 0.0–0.2)

## 2021-04-06 LAB — URINE CULTURE: Culture: NO GROWTH

## 2021-04-06 LAB — CORTISOL-AM, BLOOD: Cortisol - AM: 16.3 ug/dL (ref 6.7–22.6)

## 2021-04-06 MED ORDER — AZITHROMYCIN 500 MG PO TABS
500.0000 mg | ORAL_TABLET | Freq: Every day | ORAL | Status: DC
  Administered 2021-04-06: 500 mg via ORAL
  Filled 2021-04-06: qty 1

## 2021-04-06 MED ORDER — SIMVASTATIN 20 MG PO TABS
40.0000 mg | ORAL_TABLET | Freq: Every day | ORAL | Status: DC
  Administered 2021-04-06: 40 mg via ORAL
  Filled 2021-04-06: qty 2

## 2021-04-06 MED ORDER — ADULT MULTIVITAMIN W/MINERALS CH
1.0000 | ORAL_TABLET | Freq: Every day | ORAL | Status: DC
  Administered 2021-04-06 – 2021-04-07 (×2): 1 via ORAL
  Filled 2021-04-06 (×2): qty 1

## 2021-04-06 MED ORDER — AMLODIPINE BESYLATE 5 MG PO TABS: 5.0000 mg | ORAL_TABLET | Freq: Every day | ORAL | Status: DC

## 2021-04-06 MED ORDER — MAGNESIUM SULFATE 2 GM/50ML IV SOLN
2.0000 g | Freq: Once | INTRAVENOUS | Status: AC
  Administered 2021-04-06: 2 g via INTRAVENOUS
  Filled 2021-04-06: qty 50

## 2021-04-06 MED ORDER — POTASSIUM CHLORIDE CRYS ER 20 MEQ PO TBCR
40.0000 meq | EXTENDED_RELEASE_TABLET | ORAL | Status: AC
  Administered 2021-04-06 (×2): 40 meq via ORAL
  Filled 2021-04-06 (×2): qty 2

## 2021-04-06 MED ORDER — GABAPENTIN 100 MG PO CAPS
200.0000 mg | ORAL_CAPSULE | Freq: Every day | ORAL | Status: DC
  Administered 2021-04-06: 200 mg via ORAL
  Filled 2021-04-06: qty 2

## 2021-04-06 MED ORDER — METOPROLOL SUCCINATE ER 25 MG PO TB24: 12.5000 mg | ORAL_TABLET | Freq: Every day | ORAL | Status: DC

## 2021-04-06 MED ORDER — HYDROCODONE-ACETAMINOPHEN 5-325 MG PO TABS: 1.0000 | ORAL_TABLET | Freq: Three times a day (TID) | ORAL | Status: DC | PRN

## 2021-04-06 MED ORDER — METHYLPREDNISOLONE SODIUM SUCC 125 MG IJ SOLR
60.0000 mg | Freq: Two times a day (BID) | INTRAMUSCULAR | Status: DC
  Administered 2021-04-06 – 2021-04-07 (×2): 60 mg via INTRAVENOUS
  Filled 2021-04-06 (×2): qty 2

## 2021-04-06 MED ORDER — METOPROLOL SUCCINATE ER 25 MG PO TB24: 25.0000 mg | ORAL_TABLET | Freq: Every day | ORAL | Status: DC

## 2021-04-06 MED ORDER — FAMOTIDINE 20 MG PO TABS
20.0000 mg | ORAL_TABLET | Freq: Every day | ORAL | Status: DC
  Administered 2021-04-06: 20 mg via ORAL
  Filled 2021-04-06: qty 1

## 2021-04-06 MED ORDER — ASPIRIN EC 81 MG PO TBEC
81.0000 mg | DELAYED_RELEASE_TABLET | Freq: Every day | ORAL | Status: DC
  Administered 2021-04-06: 81 mg via ORAL
  Filled 2021-04-06: qty 1

## 2021-04-06 NOTE — Progress Notes (Signed)
Physical Therapy Treatment Patient Details Name: Elizabeth [name] MRN: [id number] DOB: 12-27-1940 Today's Date: 04/06/2021    History of Present Illness Pt adm 6/2 with abdominal pain, weakness, and SOB. Pt found to have sepsis with PNA. PMH - copd, htn, CAD, IBS, osteoarthritis, PVD, pancreatitis, lumbar laminectomy    PT Comments    Pt supine in bed on entry, reporting that she feels better today and is agreeable to working with therapy. Pt is limited in safe mobility by increased O2 demand and generalized weakness. Pt is currently independent with bed mobility and supervision for transfers and ambulation while pushing IV pole. Pt on 2L O2 via [place] on entry with SaO2 92%O2, removed and SaO2 dropped to 87%O2. Pt utilized inhaler and SaO2 improved to 90%O2. With ambulation SaO2 dropped to 85%O2 on RA. Returned to 2L O2 via [place] when returned to room. D/c plan remains appropriate at this time. PT will continue to follow acutely.    Follow Up Recommendations  No PT follow up     Equipment Recommendations  None recommended by PT       Precautions / Restrictions Restrictions Weight Bearing Restrictions: No    Mobility  Bed Mobility Overal bed mobility: Independent                  Transfers Overall transfer level: Needs assistance Equipment used: None Transfers: Sit to/from Stand Sit to Stand: Supervision         General transfer comment: supervision for safety  Ambulation/Gait Ambulation/Gait assistance: Supervision Gait Distance (Feet): 150 Feet Assistive device: IV Pole Gait Pattern/deviations: Step-through pattern;Decreased step length - right;Decreased step length - left;Shuffle Gait velocity: slowed Gait velocity interpretation: <1.8 ft/sec, indicate of risk for recurrent falls General Gait Details: supervision for safety with slow, shuffling gait         Balance Overall balance assessment: Mild deficits observed, not formally tested                                           Cognition Arousal/Alertness: Awake/alert Behavior During Therapy: WFL for tasks assessed/performed Overall Cognitive Status: Within Functional Limits for tasks assessed                                           General Comments General comments (skin integrity, edema, etc.): Pt on 2L O2 via [place] on entry with SaO2 94%O2, ambulated on RA and SaO2 dropped to 85%O2, rebounded to 87% with purse lip breathing, replaced 2L O2 when returned to room SaO2 90%O2      Pertinent Vitals/Pain Pain Assessment: No/denies pain           PT Goals (current goals can now be found in the care plan section) Acute Rehab PT Goals Patient Stated Goal: feel better PT Goal Formulation: With patient Time For Goal Achievement: 04/12/21 Potential to Achieve Goals: Good Progress towards PT goals: Progressing toward goals    Frequency    Min 3X/week      PT Plan Current plan remains appropriate       AM-PAC PT "6 Clicks" Mobility   Outcome Measure  Help needed turning from your back to your side while in a flat bed without using bedrails?: None Help needed moving from lying on your back to  sitting on the side of a flat bed without using bedrails?: None Help needed moving to and from a bed to a chair (including a wheelchair)?: A Little Help needed standing up from a chair using your arms (e.g., wheelchair or bedside chair)?: A Little Help needed to walk in hospital room?: A Little Help needed climbing 3-5 steps with a railing? : A Little 6 Click Score: 20    End of Session Equipment Utilized During Treatment: Gait belt Activity Tolerance: Other (comment);Patient limited by fatigue (limited by nausea) Patient left: with call bell/phone within reach;in chair;with chair alarm set Nurse Communication: Mobility status PT Visit Diagnosis: Other abnormalities of gait and mobility (R26.89)     Time: 1100-1134 PT Time Calculation (min) (ACUTE ONLY): 34  min  Charges:  $Therapeutic Exercise: 8-22 mins $Therapeutic Activity: 8-22 mins                     Davan Nawabi B. Migdalia Dk PT, DPT Acute Rehabilitation Services Pager [phone] Office [phone]    [place] 04/06/2021, 11:41 AM

## 2021-04-06 NOTE — Progress Notes (Signed)
PROGRESS NOTE  Bilan Tedesco [place] NID:[id number] DOB: January 26, 1941   PCP: [name], MD  Patient is from: Home.  Independent at baseline.  DOA: 04/04/2021 LOS: 2  Chief complaints:  Chief Complaint  Patient presents with  . Abdominal Pain    Brief Narrative / Interim history: 80 year old F with PMH of COPD, pancreatitis, HTN, GERD, sigmoid colectomy and IBS presenting with nausea, abdominal pain, generalized weakness, shortness of breath and altered mental status, and admitted for sepsis due to RUL pneumonia as noted on CXR.  Cultures drawn.  Patient was started on IV ceftriaxone and azithromycin.  On further review, patient meets severe sepsis criteria given lactic acidosis and encephalopathy on presentation.  She is also requiring supplemental oxygen.  Blood and urine cultures NGTD.    Subjective: Seen and examined earlier this morning.  No major events overnight of this morning.  She says she feels "lethargic".  Still with "some" shortness of breath and abdominal discomfort.  She denies back pain or urinary symptoms.  She denies nausea or vomiting.  She says she just feels weak.  Objective: Vitals:   04/05/21 2005 04/06/21 0444 04/06/21 0928 04/06/21 1115  BP: 137/72 (!) 139/50 (!) 120/47   Pulse: 83 88 77   Resp: 16 16 18    Temp: 98.8 F (37.1 C) 98.1 F (36.7 C) 98.2 F (36.8 C)   TempSrc: Oral Oral Oral   SpO2: 94% 90% 92% (!) 85%  Weight:        Intake/Output Summary (Last 24 hours) at 04/06/2021 1510 Last data filed at 04/06/2021 0823 Gross per 24 hour  Intake 680 ml  Output 550 ml  Net 130 ml   Filed Weights   04/04/21 2304  Weight: 52 kg    Examination:  GENERAL: No apparent distress.  Nontoxic. HEENT: MMM.  Vision and hearing grossly intact.  NECK: Supple.  No apparent JVD.  RESP: 92% on 2 L.  No IWOB.  Fair aeration bilaterally. CVS:  RRR. Heart sounds normal.  ABD/GI/GU: BS+. Abd soft.  Some discomfort but no significant tenderness or rebound.  No CVA  tenderness. MSK/EXT:  Moves extremities. No apparent deformity. No edema.  SKIN: no apparent skin lesion or wound NEURO: Awake but not quite alert.  Oriented appropriately.  No apparent focal neuro deficit. PSYCH: Calm. Normal affect.  Procedures:  None   Microbiology summarized: [id number] and influenza PCR nonreactive. Blood cultures NGTD Urine culture NGTD  Assessment & Plan: Severe sepsis due to RUL pneumonia: POA: Heart tachycardia, leukocytosis, lactic acidosis and encephalopathy on presentation.  CXR concerning for RUL pneumonia.  Pro-Cal and leukocytosis downtrending.  Cultures negative.   -Continue IV ceftriaxone and azithromycin -Incentive spirometry, nebulizers/inhalers -Nebulizers  Mild COPD exacerbation: Likely due to the above. -Treatment as above -Start systemic steroid  Acute respiratory failure with hypoxia: Likely due to the above.  Desaturated to 87% on RA at rest.  -Start systemic steroid -Antibiotics and breathing treatments as above  Hypotension in patient with history of essential hypertension: Likely due to the above.  Improved. -Continue holding antihypertensive meds  Acute metabolic encephalopathy-likely due to #1.  She is also on Norco and gabapentin at home which could contribute.  She is awake but not quite alert. -Treat treatable causes -Discontinue Norco-does not seem to be taking this per narcotic database -Minimize sedating medications  Abdominal pain-likely due to IBS.  CT shows some perinephric stranding on the right side but patient has no clinical pyelonephritis.  Hypokalemia/hypomagnesemia: K3.3.  Mg 1.5. -Replenish  and recheck  GERD -Continue PPI  Hyperlipidemia -Continue statin  Hyponatremia: Improved.  Likely due to Avalide -Continue holding Avalide -Recheck in the morning  Generalized weakness/"lethargy": Likely due to acute illness. -PT/OT-no need identified.   Body mass index is 21.66 kg/m.         DVT  prophylaxis:  enoxaparin (LOVENOX) injection 30 mg Start: 04/04/21 1945  Code Status: Full code Family Communication: Patient and/or RN. Available if any question.  Level of care: Telemetry Medical Status is: Inpatient  Remains inpatient appropriate because:Hemodynamically unstable, Persistent severe electrolyte disturbances, IV treatments appropriate due to intensity of illness or inability to take PO and Inpatient level of care appropriate due to severity of illness   Dispo: The patient is from: Home              Anticipated d/c is to: Home likely on 6/5.              Patient currently is not medically stable to d/c.   Difficult to place patient No       Consultants:  None   Sch Meds:  Scheduled Meds: . amLODipine  5 mg Oral QHS  . aspirin EC  81 mg Oral QHS  . azithromycin  500 mg Oral Daily  . enoxaparin (LOVENOX) injection  30 mg Subcutaneous Q24H  . famotidine  20 mg Oral QHS  . gabapentin  200 mg Oral QHS  . guaiFENesin  600 mg Oral BID  . metoprolol succinate  25 mg Oral QHS  . mometasone-formoterol  2 puff Inhalation BID  . multivitamin with minerals  1 tablet Oral Daily  . simvastatin  40 mg Oral QHS  . umeclidinium bromide  1 puff Inhalation Daily   Continuous Infusions: . cefTRIAXone (ROCEPHIN)  IV 2 g (04/05/21 1523)   PRN Meds:.acetaminophen **OR** acetaminophen, guaiFENesin-dextromethorphan, HYDROcodone-acetaminophen, ipratropium-albuterol, ondansetron **OR** ondansetron (ZOFRAN) IV  Antimicrobials: Anti-infectives (From admission, onward)   Start     Dose/Rate Route Frequency Ordered Stop   04/06/21 1600  azithromycin (ZITHROMAX) tablet 500 mg        500 mg Oral Daily 04/06/21 0846 04/09/21 1559   04/05/21 1630  azithromycin (ZITHROMAX) 500 mg in sodium chloride 0.9 % 250 mL IVPB  Status:  Discontinued        500 mg 250 mL/hr over 60 Minutes Intravenous Every 24 hours 04/05/21 0718 04/06/21 0845   04/05/21 1600  cefTRIAXone (ROCEPHIN) 2 g in sodium  chloride 0.9 % 100 mL IVPB  Status:  Discontinued        2 g 200 mL/hr over 30 Minutes Intravenous Every 24 hours 04/04/21 2002 04/04/21 2005   04/05/21 1600  azithromycin (ZITHROMAX) 500 mg in sodium chloride 0.9 % 250 mL IVPB  Status:  Discontinued        500 mg 250 mL/hr over 60 Minutes Intravenous Every 24 hours 04/04/21 2002 04/04/21 2005   04/05/21 1600  cefTRIAXone (ROCEPHIN) 2 g in sodium chloride 0.9 % 100 mL IVPB  Status:  Discontinued        2 g 200 mL/hr over 30 Minutes Intravenous  Once 04/04/21 2006 04/05/21 0718   04/05/21 1600  azithromycin (ZITHROMAX) 500 mg in sodium chloride 0.9 % 250 mL IVPB  Status:  Discontinued        500 mg 250 mL/hr over 60 Minutes Intravenous  Once 04/04/21 2006 04/05/21 0718   04/05/21 1600  cefTRIAXone (ROCEPHIN) 2 g in sodium chloride 0.9 % 100 mL IVPB  2 g 200 mL/hr over 30 Minutes Intravenous Every 24 hours 04/05/21 0718 04/10/21 1559   04/04/21 2015  cefTRIAXone (ROCEPHIN) 1 g in sodium chloride 0.9 % 100 mL IVPB        1 g 200 mL/hr over 30 Minutes Intravenous  Once 04/04/21 2002 04/04/21 2122   04/04/21 1945  cefTRIAXone (ROCEPHIN) 2 g in sodium chloride 0.9 % 100 mL IVPB  Status:  Discontinued        2 g 200 mL/hr over 30 Minutes Intravenous Every 24 hours 04/04/21 1939 04/04/21 2002   04/04/21 1945  azithromycin (ZITHROMAX) 500 mg in sodium chloride 0.9 % 250 mL IVPB  Status:  Discontinued        500 mg 250 mL/hr over 60 Minutes Intravenous Every 24 hours 04/04/21 1939 04/04/21 2002   04/04/21 1730  azithromycin (ZITHROMAX) 500 mg in sodium chloride 0.9 % 250 mL IVPB        500 mg 250 mL/hr over 60 Minutes Intravenous  Once 04/04/21 1718 04/04/21 1910   04/04/21 1700  cefTRIAXone (ROCEPHIN) 1 g in sodium chloride 0.9 % 100 mL IVPB        1 g 200 mL/hr over 30 Minutes Intravenous  Once 04/04/21 1658 04/04/21 1759       I have personally reviewed the following labs and images: CBC: Recent Labs  Lab 04/04/21 1658  04/05/21 0345 04/06/21 0307  WBC 15.7* 17.9* 13.8*  NEUTROABS 13.4*  --   --   HGB 11.4* 10.7* 10.4*  HCT 34.3* 32.6* 31.4*  MCV 92.5 92.1 92.6  PLT 162 137* 141*   BMP &GFR Recent Labs  Lab 04/04/21 1658 04/05/21 0345 04/06/21 0307  NA 131* 134* 134*  K 4.0 3.7 3.3*  CL 100 101 104  CO2 23 23 23   GLUCOSE 125* 123* 92  BUN 19 15 9   CREATININE 1.13* 0.87 0.66  CALCIUM 8.2* 8.1* 7.9*  MG  --   --  1.5*  PHOS  --   --  2.7   Estimated Creatinine Clearance: 42.3 mL/min (by C-G formula based on SCr of 0.66 mg/dL). Liver & Pancreas: Recent Labs  Lab 04/04/21 1658 04/05/21 0345 04/06/21 0307  AST 49* 25  --   ALT 12 11  --   ALKPHOS 54 47  --   BILITOT 1.7* 0.7  --   PROT 5.4* 5.1*  --   ALBUMIN 2.8* 2.3* 2.2*   No results for input(s): LIPASE, AMYLASE in the last 168 hours. No results for input(s): AMMONIA in the last 168 hours. Diabetic: No results for input(s): HGBA1C in the last 72 hours. No results for input(s): GLUCAP in the last 168 hours. Cardiac Enzymes: No results for input(s): CKTOTAL, CKMB, CKMBINDEX, TROPONINI in the last 168 hours. No results for input(s): PROBNP in the last 8760 hours. Coagulation Profile: Recent Labs  Lab 04/04/21 1658 04/05/21 0345  INR 1.2 1.4*   Thyroid Function Tests: No results for input(s): TSH, T4TOTAL, FREET4, T3FREE, THYROIDAB in the last 72 hours. Lipid Profile: No results for input(s): CHOL, HDL, LDLCALC, TRIG, CHOLHDL, LDLDIRECT in the last 72 hours. Anemia Panel: No results for input(s): VITAMINB12, FOLATE, FERRITIN, TIBC, IRON, RETICCTPCT in the last 72 hours. Urine analysis:    Component Value Date/Time   COLORURINE YELLOW 04/05/2021 0706   APPEARANCEUR CLEAR 04/05/2021 0706   LABSPEC 1.011 04/05/2021 0706   PHURINE 7.0 04/05/2021 0706   GLUCOSEU NEGATIVE 04/05/2021 0706   HGBUR SMALL (A) 04/05/2021 0706  BILIRUBINUR NEGATIVE 04/05/2021 0706   KETONESUR NEGATIVE 04/05/2021 0706   PROTEINUR NEGATIVE  04/05/2021 0706   NITRITE NEGATIVE 04/05/2021 0706   LEUKOCYTESUR NEGATIVE 04/05/2021 0706   Sepsis Labs: Invalid input(s): PROCALCITONIN, [place]  Microbiology: Recent Results (from the past 240 hour(s))  Urine culture     Status: None   Collection Time: 04/04/21  4:58 PM   Specimen: In/Out Cath Urine  Result Value Ref Range Status   Specimen Description IN/OUT CATH URINE  Final   Special Requests NONE  Final   Culture   Final    NO GROWTH Performed at [facility], [address], [place], [place] [address]    Report Status 04/06/2021 FINAL  Final  Resp Panel by RT-PCR (Flu A&B, Covid) Nasopharyngeal Swab     Status: None   Collection Time: 04/04/21  5:16 PM   Specimen: Nasopharyngeal Swab; Nasopharyngeal(NP) swabs in vial transport medium  Result Value Ref Range Status   SARS Coronavirus 2 by RT PCR NEGATIVE NEGATIVE Final    Comment: (NOTE) SARS-CoV-2 target nucleic acids are NOT DETECTED.  The SARS-CoV-2 RNA is generally detectable in upper respiratory specimens during the acute phase of infection. The lowest concentration of SARS-CoV-2 viral copies this assay can detect is 138 copies/mL. A negative result does not preclude SARS-Cov-2 infection and should not be used as the sole basis for treatment or other patient management decisions. A negative result may occur with  improper specimen collection/handling, submission of specimen other than nasopharyngeal swab, presence of viral mutation(s) within the areas targeted by this assay, and inadequate number of viral copies(<138 copies/mL). A negative result must be combined with clinical observations, patient history, and epidemiological information. The expected result is Negative.  Fact Sheet for Patients:  EntrepreneurPulse.com.au  Fact Sheet for Healthcare Providers:  IncredibleEmployment.be  This test is no t yet approved or cleared by the [place] FDA and  has  been authorized for detection and/or diagnosis of SARS-CoV-2 by FDA under an Emergency Use Authorization (EUA). This EUA will remain  in effect (meaning this test can be used) for the duration of the COVID-19 declaration under Section 564(b)(1) of the Act, 21 U.S.C.section 360bbb-3(b)(1), unless the authorization is terminated  or revoked sooner.       Influenza A by PCR NEGATIVE NEGATIVE Final   Influenza B by PCR NEGATIVE NEGATIVE Final    Comment: (NOTE) The Xpert Xpress SARS-CoV-2/FLU/RSV plus assay is intended as an aid in the diagnosis of influenza from Nasopharyngeal swab specimens and should not be used as a sole basis for treatment. Nasal washings and aspirates are unacceptable for Xpert Xpress SARS-CoV-2/FLU/RSV testing.  Fact Sheet for Patients: EntrepreneurPulse.com.au  Fact Sheet for Healthcare Providers: IncredibleEmployment.be  This test is not yet approved or cleared by the [place] FDA and has been authorized for detection and/or diagnosis of SARS-CoV-2 by FDA under an Emergency Use Authorization (EUA). This EUA will remain in effect (meaning this test can be used) for the duration of the COVID-19 declaration under Section 564(b)(1) of the Act, 21 U.S.C. section 360bbb-3(b)(1), unless the authorization is terminated or revoked.  Performed at [facility], [place] [address], [place], [place] [address]   Blood Culture (routine x 2)     Status: None (Preliminary result)   Collection Time: 04/04/21  5:19 PM   Specimen: BLOOD RIGHT FOREARM  Result Value Ref Range Status   Specimen Description BLOOD RIGHT FOREARM  Final   Special Requests   Final  BOTTLES DRAWN AEROBIC AND ANAEROBIC Blood Culture adequate volume   Culture   Final    NO GROWTH < 12 HOURS Performed at [facility], [place] [address], [place], [place] [address]    Report Status PENDING  Incomplete  Blood Culture (routine x 2)     Status: None  (Preliminary result)   Collection Time: 04/04/21  6:05 PM   Specimen: BLOOD LEFT FOREARM  Result Value Ref Range Status   Specimen Description BLOOD LEFT FOREARM  Final   Special Requests   Final    BOTTLES DRAWN AEROBIC AND ANAEROBIC Blood Culture results may not be optimal due to an inadequate volume of blood received in culture bottles   Culture   Final    NO GROWTH < 12 HOURS Performed at [facility], [place] [address] [place], [place] [address]    Report Status PENDING  Incomplete    Radiology Studies: CT ABDOMEN PELVIS WO CONTRAST  Result Date: 04/06/2021 CLINICAL DATA:  Acute nonlocalized abdominal pain EXAM: CT ABDOMEN AND PELVIS WITHOUT CONTRAST TECHNIQUE: Multidetector CT imaging of the abdomen and pelvis was performed following the standard protocol without IV contrast. COMPARISON:  11/17/2020 FINDINGS: Lower chest: Small right and trace left pleural effusions are present with associated bibasilar atelectasis. Superimposed focal pulmonary infiltrate within the right lower lobe is difficult to exclude. Cardiac size within normal limits. No pericardial effusion. Extensive atherosclerotic calcification within the a right coronary artery. Extensive calcification of the mitral valve annulus. Hepatobiliary: No focal liver abnormality is seen. No gallstones, gallbladder wall thickening, or biliary dilatation. Pancreas: Unremarkable Spleen: Unremarkable Adrenals/Urinary Tract: The adrenal glands are normal. The kidneys are normal in size and position. There has developed extensive right perinephric stranding. No hydronephrosis. Scattered calcifications within the renal hila bilaterally appears stable and likely represent vascular calcifications. A a stable 1-2 mm punctate nonobstructing calculus is seen within the left kidney. No ureteral calculi. The bladder is unremarkable. Stomach/Bowel: The stomach, small bowel, and large bowel are unremarkable. The appendix is not clearly identified and  may be absent. Mild ascites has developed since prior examination. There is no free intraperitoneal gas. Vascular/Lymphatic: Extensive aortoiliac atherosclerotic calcification is seen. Extensive plaque almost certainly results in hemodynamically significant stenoses at the aortic bifurcation, however, this is not well assessed on this non arteriographic study. The infrarenal abdominal aorta measures 23 mm in diameter, stable since prior examination, though not technically dilated given the diameter of the normal segment of abdominal aorta above this level (18 mm). No pathologic adenopathy within the abdomen and pelvis. Reproductive: Uterus and bilateral adnexa are unremarkable. Previously noted pelvic varices are not well visualized on this noncontrast examination. Other: No abdominal wall hernia.  Rectum unremarkable. Musculoskeletal: Left hip ORIF has been performed. Osseous structures are diffusely osteopenic. Resection of the L4 spinous process has been performed. No acute bone abnormality. IMPRESSION: Interval development of extensive right perinephric stranding, possibly infectious or inflammatory in nature. Correlation with urinalysis and urine culture may be helpful. Interval development of mild anasarca with small bilateral pleural effusions and trace ascites. Interval resolution of extensive peripancreatic inflammatory stranding noted on prior examination. Stable minimal left nonobstructing nephrolithiasis. Peripheral vascular disease with extensive aortoiliac atherosclerotic calcification. If there is clinical evidence of lower extremity arterial insufficiency (claudication, tissue necrosis), segmental arterial pressures and/or ankle brachial indices may be helpful for further management. Aortic Atherosclerosis (ICD10-I70.0). Electronically Signed   By: Fidela Salisbury MD   On: 04/06/2021 00:21  Clydie Dillen T. [place]  If 7PM-7AM, please contact night-coverage [email or web address] 04/06/2021,  3:10 PM

## 2021-04-06 NOTE — Progress Notes (Signed)
Occupational Therapy Evaluation Patient Details Name: Elizabeth [name] MRN: [id number] DOB: Sep 18, 1941 Today's Date: 04/06/2021    History of Present Illness Pt adm 6/2 with abdominal pain, weakness, and SOB. Pt found to have sepsis with PNA. PMH - copd, htn, CAD, IBS, osteoarthritis, PVD, pancreatitis, lumbar laminectomy   Clinical Impression   Pt received with above diagnosis. PTA pt lives alone with intermittent assistance as needed from family that lives in close proximity, pt reports living in 1 level home with accessible bathroom with walk in tub/shower unit and AE provided below. Pt is I to mod I with ADLs. Pt currently requires S for functional transfer for safety but is limited due to decreased O2 sats during functional activities. Pt is currently on 2L O2 (see below for levels during ADL tasks). Pt will benefit from continued acute OT to address established deficits and to ensure safe transition to home setting with EC strategies, activity tolerance, and safety with ADLs. DC recommendation to home with help from family as needed.      Follow Up Recommendations  No OT follow up    Equipment Recommendations       Recommendations for Other Services       Precautions / Restrictions Precautions Precautions: None Restrictions Weight Bearing Restrictions: No      Mobility Bed Mobility Overal bed mobility: Independent             General bed mobility comments: Pt received sitting up in chair upon arrival.    Transfers Overall transfer level: Needs assistance Equipment used: None Transfers: Sit to/from Stand Sit to Stand: Supervision         General transfer comment: supervision for safety    Balance Overall balance assessment: Mild deficits observed, not formally tested                                         ADL either performed or assessed with clinical judgement   ADL Overall ADL's : Modified independent                                        General ADL Comments: Increased time required due to decreased activity tolerance and low O2 sats. At baseline pt is independent with ADLs, still drives, and performs household duties such medication management and managing bills. Reports no use of AE for functional mobiltiy and O2. Pt is currently on 2Ls 94% SpO2 at rest, O2 cannula removed to assess saturation. When performing self care task dropped to 87% on RA, 78% after transfer to [facility]. Once nasal cannula given back to pt, with rest pt able to return to 94% on 2L O2.     Vision         Perception     Praxis      Pertinent Vitals/Pain Pain Assessment: No/denies pain     Hand Dominance Right   Extremity/Trunk Assessment Upper Extremity Assessment Upper Extremity Assessment: Overall WFL for tasks assessed   Lower Extremity Assessment Lower Extremity Assessment: Defer to PT evaluation   Cervical / Trunk Assessment Cervical / Trunk Assessment: Normal   Communication Communication Communication: No difficulties   Cognition Arousal/Alertness: Awake/alert Behavior During Therapy: WFL for tasks assessed/performed Overall Cognitive Status: Within Functional Limits for tasks assessed  General Comments  Pt on 2L O2 via [place] on entry with SaO2 94%O2, ambulated on RA and SaO2 dropped to 85%O2, rebounded to 87% with purse lip breathing, replaced 2L O2 when returned to room SaO2 90%O2    Exercises     Shoulder Instructions      Home Living Family/patient expects to be discharged to:: Private residence Living Arrangements: Alone (son and granddaughter live in houses on same property) Available Help at Discharge: Available PRN/intermittently;Family Type of Home: House Home Access: Level entry     Home Layout: One level     Bathroom Shower/Tub: Occupational psychologist: Handicapped height     Home Equipment: Environmental consultant - 2 wheels;Cane - single  point;Shower seat - built in;Wheelchair - manual;Hand held shower head          Prior Functioning/Environment Level of Independence: Independent                 OT Problem List: Decreased activity tolerance      OT Treatment/Interventions: Self-care/ADL training;Therapeutic exercise;Energy conservation;DME and/or AE instruction;Therapeutic activities;Patient/family education;Balance training    OT Goals(Current goals can be found in the care plan section) Acute Rehab OT Goals Patient Stated Goal: feel better OT Goal Formulation: With patient Time For Goal Achievement: 04/20/21 Potential to Achieve Goals: Fair  OT Frequency: Min 2X/week   Barriers to D/C:            Co-evaluation              AM-PAC OT "6 Clicks" Daily Activity     Outcome Measure Help from another person eating meals?: None Help from another person taking care of personal grooming?: None Help from another person toileting, which includes using toliet, bedpan, or urinal?: None Help from another person bathing (including washing, rinsing, drying)?: None Help from another person to put on and taking off regular upper body clothing?: None Help from another person to put on and taking off regular lower body clothing?: None 6 Click Score: 24   End of Session Equipment Utilized During Treatment: Gait belt Nurse Communication: Mobility status  Activity Tolerance: Patient limited by fatigue Patient left: in chair;with call bell/phone within reach  OT Visit Diagnosis: Muscle weakness (generalized) (M62.81)                Time: 6568-1275 OT Time Calculation (min): 19 min Charges:  OT General Charges $OT Visit: 1 Visit OT Evaluation $OT Eval Low Complexity: [place], MSOT, OTR/L  Supplemental Rehabilitation Services  [phone]   Marius Ditch 04/06/2021, 2:19 PM

## 2021-04-06 NOTE — Plan of Care (Signed)
  Problem: Education: Goal: Knowledge of General Education information will improve Description: Including pain rating scale, medication(s)/side effects and non-pharmacologic comfort measures Outcome: Completed/Met   Problem: Health Behavior/Discharge Planning: Goal: Ability to manage health-related needs will improve Outcome: Completed/Met

## 2021-04-07 DIAGNOSIS — A419 Sepsis, unspecified organism: Secondary | ICD-10-CM | POA: Diagnosis not present

## 2021-04-07 LAB — CBC
HCT: 32.1 % — ABNORMAL LOW (ref 36.0–46.0)
Hemoglobin: 10.6 g/dL — ABNORMAL LOW (ref 12.0–15.0)
MCH: 30.5 pg (ref 26.0–34.0)
MCHC: 33 g/dL (ref 30.0–36.0)
MCV: 92.2 fL (ref 80.0–100.0)
Platelets: 159 10*3/uL (ref 150–400)
RBC: 3.48 MIL/uL — ABNORMAL LOW (ref 3.87–5.11)
RDW: 13.4 % (ref 11.5–15.5)
WBC: 7.3 10*3/uL (ref 4.0–10.5)
nRBC: 0 % (ref 0.0–0.2)

## 2021-04-07 LAB — RENAL FUNCTION PANEL
Albumin: 2.3 g/dL — ABNORMAL LOW (ref 3.5–5.0)
Anion gap: 9 (ref 5–15)
BUN: 8 mg/dL (ref 8–23)
CO2: 22 mmol/L (ref 22–32)
Calcium: 8.4 mg/dL — ABNORMAL LOW (ref 8.9–10.3)
Chloride: 103 mmol/L (ref 98–111)
Creatinine, Ser: 0.77 mg/dL (ref 0.44–1.00)
GFR, Estimated: >60 mL/min (ref >60)
Glucose, Bld: 197 mg/dL — ABNORMAL HIGH (ref 70–99)
Phosphorus: 3.9 mg/dL (ref 2.5–4.6)
Potassium: 4.7 mmol/L (ref 3.5–5.1)
Sodium: 134 mmol/L — ABNORMAL LOW (ref 135–145)

## 2021-04-07 LAB — MAGNESIUM: Magnesium: 1.9 mg/dL (ref 1.7–2.4)

## 2021-04-07 LAB — PROCALCITONIN: Procalcitonin: 1.21 ng/mL

## 2021-04-07 MED ORDER — PREDNISONE 50 MG PO TABS: 50.0000 mg | ORAL_TABLET | Freq: Every day | ORAL | 0 refills | Status: AC

## 2021-04-07 MED ORDER — GUAIFENESIN-DM 100-10 MG/5ML PO SYRP: 5.0000 mL | ORAL_SOLUTION | ORAL | 0 refills | Status: AC | PRN

## 2021-04-07 MED ORDER — CEFDINIR 300 MG PO CAPS: 300.0000 mg | ORAL_CAPSULE | Freq: Two times a day (BID) | ORAL | 0 refills | Status: AC

## 2021-04-07 MED ORDER — GUAIFENESIN ER 600 MG PO TB12: 600.0000 mg | ORAL_TABLET | Freq: Two times a day (BID) | ORAL | 0 refills | Status: AC

## 2021-04-07 NOTE — Discharge Instructions (Signed)

## 2021-04-07 NOTE — Discharge Summary (Signed)
Physician Discharge Summary  Elizabeth [name] IWP:[id number] DOB: Mar 29, 1941 DOA: 04/04/2021  PCP: [name], MD  Admit date: 04/04/2021 Discharge date: 04/07/2021 30 Day Unplanned Readmission Risk Score   Flowsheet Row ED to Hosp-Admission (Current) from 04/04/2021 in [facility]  30 Day Unplanned Readmission Risk Score (%) 19.37 Filed at 04/07/2021 0801     This score is the patient's risk of an unplanned readmission within 30 days of being discharged (0 -100%). The score is based on dignosis, age, lab data, medications, orders, and past utilization.   Low:  0-14.9   Medium: 15-21.9   High: 22-29.9   Extreme: 30 and above         Admitted From: Home Disposition: Home  Recommendations for Outpatient Follow-up:  1. Follow up with PCP in 1-2 weeks 2. Please obtain BMP/CBC in one week 3. Please follow up with your PCP on the following pending results: Unresulted Labs (From admission, onward)         None        Home Health: None Equipment/Devices: None  Discharge Condition: Stable CODE STATUS: Full code Diet recommendation: Cardiac  Subjective: Seen and examined.  Complains of cough and some physical weakness but no shortness of breath or other complaint.  Brief/Interim Summary: 80 year old F with PMH of COPD, pancreatitis, HTN, GERD, sigmoid colectomy and IBS presented with nausea, abdominal pain, generalized weakness, shortness of breath and altered mental status, and admitted for sepsis due to RUL pneumonia as noted on CXR.  Cultures drawn. Patient was started on IV ceftriaxone and azithromycin.  patient met severe sepsis criteria given tachycardia, leukocytosis, pneumonia, lactic acidosis and encephalopathy on presentation.  Blood and urine cultures NGTD.  She was also diagnosed with mild COPD exacerbation for which she was started on bronchodilators and systemic p.o. steroids.  She also came in with hypotension so her antihypertensives were held and her blood  pressure has improved now.  Also came in with mild acute encephalopathy which was metabolic/toxic secondary to all of the above.  She has been alert and oriented since last 2 to 3 days.  All electrolytes were replaced.  Home medications were continued except antihypertensives initially.  She was seen by PT OT and they did not recommend any further PT OT.  Patient has improved clinically.  She was weaned down to room air and ambulated well and oxygen saturated well over 95% on room air.  Reportedly, she did complain of some abdominal pain yesterday to the hospitalist for that, CT abdomen and pelvis without contrast was done which showed interval development of extensive right perinephric stranding possibly infectious or inflammatory nature.  However patient's urine analysis was checked just 1 day prior to CT and that was completely unremarkable, negative for any UTI.  Patient did not complain of any abdominal pain to me and she did not have any CVA tenderness.  Her leukocytosis has improved and she does not have any fever either.  All these findings are suggesting against pyelonephritis based off of the data available.  Nonetheless, patient has remained on Rocephin and Zithromax since admission and now she is going to be discharged on cefdinir for few more days which should cover UTI if there is any.  Now that her blood pressure is improved, I am resuming all her home medications.  Prescribing some cough medications as well.  She is agreeable with the discharge plan.  Discharge Diagnoses:  Principal Problem:   Sepsis ([place]) Active Problems:   COPD (  chronic obstructive pulmonary disease) ([place])   Hypertension   Community acquired pneumonia   Hyponatremia   Gastro-esophageal reflux disease without esophagitis   Mixed hyperlipidemia    Discharge Instructions   Allergies as of 04/07/2021      Reactions   Other Other (See Comments)   Chlorexolone - unknown reaction   Pollen Extract    Allergic to tree  pollens   Tegretol [carbamazepine] Other (See Comments)   Unknown reaction   Lisinopril Rash   Tetracyclines & Related Rash      Medication List    STOP taking these medications   ibuprofen 200 MG tablet Commonly known as: ADVIL     TAKE these medications   acetaminophen 500 MG tablet Commonly known as: TYLENOL Take 500-1,000 mg by mouth every 6 (six) hours as needed for headache (pain).   albuterol 108 (90 Base) MCG/ACT inhaler Commonly known as: VENTOLIN HFA Inhale 2 puffs into the lungs every 6 (six) hours as needed for wheezing or shortness of breath.   amLODipine 5 MG tablet Commonly known as: NORVASC Take 5 mg by mouth at bedtime.   aspirin EC 81 MG tablet Take 81 mg by mouth at bedtime. Swallow whole.   cefdinir 300 MG capsule Commonly known as: OMNICEF Take 1 capsule (300 mg total) by mouth 2 (two) times daily for 5 days.   cloNIDine 0.1 MG tablet Commonly known as: CATAPRES Take 0.1 mg by mouth at bedtime.   Coenzyme Q10 100 MG capsule Take 100 mg by mouth every morning.   famotidine 20 MG tablet Commonly known as: PEPCID Take 20-40 mg by mouth at bedtime.   gabapentin 100 MG capsule Commonly known as: NEURONTIN Take 2 capsules (200 mg total) by mouth at bedtime. PATIENT NEEDS OFFICE VISIT FOR ADDITIONAL REFILLS What changed:   how much to take  when to take this  additional instructions   guaiFENesin 600 MG 12 hr tablet Commonly known as: MUCINEX Take 1 tablet (600 mg total) by mouth 2 (two) times daily for 5 days.   guaiFENesin-dextromethorphan 100-10 MG/5ML syrup Commonly known as: ROBITUSSIN DM Take 5 mLs by mouth every 4 (four) hours as needed for up to 5 days for cough.   HYDROcodone-acetaminophen 5-325 MG tablet Commonly known as: NORCO/VICODIN Take 1 tablet by mouth every 4 (four) hours as needed.   Incruse Ellipta 62.5 MCG/INH Aepb Generic drug: umeclidinium bromide Inhale 1 puff into the lungs daily.    irbesartan-hydrochlorothiazide 150-12.5 MG tablet Commonly known as: AVALIDE Take 1 tablet by mouth every morning.   meloxicam 15 MG tablet Commonly known as: MOBIC Take 15 mg by mouth See admin instructions. Take one tablet (15 mg) by mouth once every few days as needed for arthritis pain   metoprolol succinate 25 MG 24 hr tablet Commonly known as: TOPROL-XL Take 25 mg by mouth at bedtime.   multivitamin with minerals Tabs tablet Take 1 tablet by mouth 2 (two) times daily.   ondansetron 4 MG disintegrating tablet Commonly known as: Zofran ODT Take 1 tablet (4 mg total) by mouth every 8 (eight) hours as needed for nausea or vomiting.   predniSONE 50 MG tablet Commonly known as: DELTASONE Take 1 tablet (50 mg total) by mouth daily with breakfast for 3 days.   simvastatin 40 MG tablet Commonly known as: ZOCOR Take 40 mg by mouth at bedtime.   Turmeric 500 MG Caps Take 500 mg by mouth every morning.   vitamin B-12 100 MCG tablet Commonly known  as: CYANOCOBALAMIN Take 100 mcg by mouth every morning.       Follow-up Information    [name], MD Follow up in 1 week(s).   Specialty: Internal Medicine Contact information: [address] [place] [address] [phone]              Allergies  Allergen Reactions  . Other Other (See Comments)    Chlorexolone - unknown reaction  . Pollen Extract     Allergic to tree pollens  . Tegretol [Carbamazepine] Other (See Comments)    Unknown reaction  . Lisinopril Rash  . Tetracyclines & Related Rash    Consultations: None   Procedures/Studies: CT ABDOMEN PELVIS WO CONTRAST  Result Date: 04/06/2021 CLINICAL DATA:  Acute nonlocalized abdominal pain EXAM: CT ABDOMEN AND PELVIS WITHOUT CONTRAST TECHNIQUE: Multidetector CT imaging of the abdomen and pelvis was performed following the standard protocol without IV contrast. COMPARISON:  11/17/2020 FINDINGS: Lower chest: Small right and trace left  pleural effusions are present with associated bibasilar atelectasis. Superimposed focal pulmonary infiltrate within the right lower lobe is difficult to exclude. Cardiac size within normal limits. No pericardial effusion. Extensive atherosclerotic calcification within the a right coronary artery. Extensive calcification of the mitral valve annulus. Hepatobiliary: No focal liver abnormality is seen. No gallstones, gallbladder wall thickening, or biliary dilatation. Pancreas: Unremarkable Spleen: Unremarkable Adrenals/Urinary Tract: The adrenal glands are normal. The kidneys are normal in size and position. There has developed extensive right perinephric stranding. No hydronephrosis. Scattered calcifications within the renal hila bilaterally appears stable and likely represent vascular calcifications. A a stable 1-2 mm punctate nonobstructing calculus is seen within the left kidney. No ureteral calculi. The bladder is unremarkable. Stomach/Bowel: The stomach, small bowel, and large bowel are unremarkable. The appendix is not clearly identified and may be absent. Mild ascites has developed since prior examination. There is no free intraperitoneal gas. Vascular/Lymphatic: Extensive aortoiliac atherosclerotic calcification is seen. Extensive plaque almost certainly results in hemodynamically significant stenoses at the aortic bifurcation, however, this is not well assessed on this non arteriographic study. The infrarenal abdominal aorta measures 23 mm in diameter, stable since prior examination, though not technically dilated given the diameter of the normal segment of abdominal aorta above this level (18 mm). No pathologic adenopathy within the abdomen and pelvis. Reproductive: Uterus and bilateral adnexa are unremarkable. Previously noted pelvic varices are not well visualized on this noncontrast examination. Other: No abdominal wall hernia.  Rectum unremarkable. Musculoskeletal: Left hip ORIF has been performed.  Osseous structures are diffusely osteopenic. Resection of the L4 spinous process has been performed. No acute bone abnormality. IMPRESSION: Interval development of extensive right perinephric stranding, possibly infectious or inflammatory in nature. Correlation with urinalysis and urine culture may be helpful. Interval development of mild anasarca with small bilateral pleural effusions and trace ascites. Interval resolution of extensive peripancreatic inflammatory stranding noted on prior examination. Stable minimal left nonobstructing nephrolithiasis. Peripheral vascular disease with extensive aortoiliac atherosclerotic calcification. If there is clinical evidence of lower extremity arterial insufficiency (claudication, tissue necrosis), segmental arterial pressures and/or ankle brachial indices may be helpful for further management. Aortic Atherosclerosis (ICD10-I70.0). Electronically Signed   By: Fidela Salisbury MD   On: 04/06/2021 00:21   DG Chest Port 1 View  Result Date: 04/04/2021 CLINICAL DATA:  Questionable sepsis EXAM: PORTABLE CHEST 1 VIEW COMPARISON:  11/20/2020 FINDINGS: Airspace disease in the right upper lobe concerning for pneumonia. Left lung clear. Heart is normal size. No effusions or acute bony  abnormality. Aortic atherosclerosis. IMPRESSION: Right upper lobe airspace opacity concerning for pneumonia. Electronically Signed   By: Rolm Baptise M.D.   On: 04/04/2021 17:15     Discharge Exam: Vitals:   04/07/21 0534 04/07/21 1011  BP: 122/61 (!) 142/88  Pulse: 75 96  Resp: 20 18  Temp: 98.3 F (36.8 C) 98 F (36.7 C)  SpO2: 93% 100%   Vitals:   04/06/21 1640 04/06/21 2044 04/07/21 0534 04/07/21 1011  BP: (!) 132/50 (!) 120/52 122/61 (!) 142/88  Pulse: 80 90 75 96  Resp: '18 20 20 18  ' Temp: 98 F (36.7 C) 99 F (37.2 C) 98.3 F (36.8 C) 98 F (36.7 C)  TempSrc: Oral Oral Oral Oral  SpO2: 95% 96% 93% 100%  Weight:        General: Pt is alert, awake, not in acute  distress Cardiovascular: RRR, S1/S2 +, no rubs, no gallops Respiratory: CTA bilaterally, no wheezing, no rhonchi Abdominal: Soft, NT, ND, bowel sounds + Extremities: no edema, no cyanosis    The results of significant diagnostics from this hospitalization (including imaging, microbiology, ancillary and laboratory) are listed below for reference.     Microbiology: Recent Results (from the past 240 hour(s))  Urine culture     Status: None   Collection Time: 04/04/21  4:58 PM   Specimen: In/Out Cath Urine  Result Value Ref Range Status   Specimen Description IN/OUT CATH URINE  Final   Special Requests NONE  Final   Culture   Final    NO GROWTH Performed at [facility], [address] [place], [place] [address]    Report Status 04/06/2021 FINAL  Final  Resp Panel by RT-PCR (Flu A&B, Covid) Nasopharyngeal Swab     Status: None   Collection Time: 04/04/21  5:16 PM   Specimen: Nasopharyngeal Swab; Nasopharyngeal(NP) swabs in vial transport medium  Result Value Ref Range Status   SARS Coronavirus 2 by RT PCR NEGATIVE NEGATIVE Final    Comment: (NOTE) SARS-CoV-2 target nucleic acids are NOT DETECTED.  The SARS-CoV-2 RNA is generally detectable in upper respiratory specimens during the acute phase of infection. The lowest concentration of SARS-CoV-2 viral copies this assay can detect is 138 copies/mL. A negative result does not preclude SARS-Cov-2 infection and should not be used as the sole basis for treatment or other patient management decisions. A negative result may occur with  improper specimen collection/handling, submission of specimen other than nasopharyngeal swab, presence of viral mutation(s) within the areas targeted by this assay, and inadequate number of viral copies(<138 copies/mL). A negative result must be combined with clinical observations, patient history, and epidemiological information. The expected result is Negative.  Fact Sheet for Patients:   EntrepreneurPulse.com.au  Fact Sheet for Healthcare Providers:  IncredibleEmployment.be  This test is no t yet approved or cleared by the [place] FDA and  has been authorized for detection and/or diagnosis of SARS-CoV-2 by FDA under an Emergency Use Authorization (EUA). This EUA will remain  in effect (meaning this test can be used) for the duration of the COVID-19 declaration under Section 564(b)(1) of the Act, 21 U.S.C.section 360bbb-3(b)(1), unless the authorization is terminated  or revoked sooner.       Influenza A by PCR NEGATIVE NEGATIVE Final   Influenza B by PCR NEGATIVE NEGATIVE Final    Comment: (NOTE) The Xpert Xpress SARS-CoV-2/FLU/RSV plus assay is intended as an aid in the diagnosis of influenza from Nasopharyngeal swab specimens and should not be  used as a sole basis for treatment. Nasal washings and aspirates are unacceptable for Xpert Xpress SARS-CoV-2/FLU/RSV testing.  Fact Sheet for Patients: EntrepreneurPulse.com.au  Fact Sheet for Healthcare Providers: IncredibleEmployment.be  This test is not yet approved or cleared by the [place] FDA and has been authorized for detection and/or diagnosis of SARS-CoV-2 by FDA under an Emergency Use Authorization (EUA). This EUA will remain in effect (meaning this test can be used) for the duration of the COVID-19 declaration under Section 564(b)(1) of the Act, 21 U.S.C. section 360bbb-3(b)(1), unless the authorization is terminated or revoked.  Performed at [facility], [place] [address], [place], [place] [address]   Blood Culture (routine x 2)     Status: None (Preliminary result)   Collection Time: 04/04/21  5:19 PM   Specimen: BLOOD RIGHT FOREARM  Result Value Ref Range Status   Specimen Description BLOOD RIGHT FOREARM  Final   Special Requests   Final    BOTTLES DRAWN AEROBIC AND ANAEROBIC Blood Culture adequate volume    Culture   Final    NO GROWTH 2 DAYS Performed at [facility], [place] [address], [place], [place] [address]    Report Status PENDING  Incomplete  Blood Culture (routine x 2)     Status: None (Preliminary result)   Collection Time: 04/04/21  6:05 PM   Specimen: BLOOD LEFT FOREARM  Result Value Ref Range Status   Specimen Description BLOOD LEFT FOREARM  Final   Special Requests   Final    BOTTLES DRAWN AEROBIC AND ANAEROBIC Blood Culture results may not be optimal due to an inadequate volume of blood received in culture bottles   Culture   Final    NO GROWTH 2 DAYS Performed at [facility], [place] [address], [place], [place] [address]    Report Status PENDING  Incomplete     Labs: BNP (last 3 results) Recent Labs    11/20/20 0150  BNP 093.2*   Basic Metabolic Panel: Recent Labs  Lab 04/04/21 1658 04/05/21 0345 04/06/21 0307 04/07/21 0250  NA 131* 134* 134* 134*  K 4.0 3.7 3.3* 4.7  CL 100 101 104 103  CO2 '23 23 23 22  ' GLUCOSE 125* 123* 92 197*  BUN '19 15 9 8  ' CREATININE 1.13* 0.87 0.66 0.77  CALCIUM 8.2* 8.1* 7.9* 8.4*  MG  --   --  1.5* 1.9  PHOS  --   --  2.7 3.9   Liver Function Tests: Recent Labs  Lab 04/04/21 1658 04/05/21 0345 04/06/21 0307 04/07/21 0250  AST 49* 25  --   --   ALT 12 11  --   --   ALKPHOS 54 47  --   --   BILITOT 1.7* 0.7  --   --   PROT 5.4* 5.1*  --   --   ALBUMIN 2.8* 2.3* 2.2* 2.3*   No results for input(s): LIPASE, AMYLASE in the last 168 hours. No results for input(s): AMMONIA in the last 168 hours. CBC: Recent Labs  Lab 04/04/21 1658 04/05/21 0345 04/06/21 0307 04/07/21 0250  WBC 15.7* 17.9* 13.8* 7.3  NEUTROABS 13.4*  --   --   --   HGB 11.4* 10.7* 10.4* 10.6*  HCT 34.3* 32.6* 31.4* 32.1*  MCV 92.5 92.1 92.6 92.2  PLT 162 137* 141* 159   Cardiac Enzymes: No results for input(s): CKTOTAL, CKMB, CKMBINDEX, TROPONINI in the last 168 hours. BNP: Invalid input(s): POCBNP CBG: No results  for input(s):  GLUCAP in the last 168 hours. D-Dimer No results for input(s): DDIMER in the last 72 hours. Hgb A1c No results for input(s): HGBA1C in the last 72 hours. Lipid Profile No results for input(s): CHOL, HDL, LDLCALC, TRIG, CHOLHDL, LDLDIRECT in the last 72 hours. Thyroid function studies No results for input(s): TSH, T4TOTAL, T3FREE, THYROIDAB in the last 72 hours.  Invalid input(s): FREET3 Anemia work up No results for input(s): VITAMINB12, FOLATE, FERRITIN, TIBC, IRON, RETICCTPCT in the last 72 hours. Urinalysis    Component Value Date/Time   COLORURINE YELLOW 04/05/2021 0706   APPEARANCEUR CLEAR 04/05/2021 0706   LABSPEC 1.011 04/05/2021 0706   PHURINE 7.0 04/05/2021 0706   GLUCOSEU NEGATIVE 04/05/2021 0706   HGBUR SMALL (A) 04/05/2021 0706   BILIRUBINUR NEGATIVE 04/05/2021 0706   KETONESUR NEGATIVE 04/05/2021 0706   PROTEINUR NEGATIVE 04/05/2021 0706   NITRITE NEGATIVE 04/05/2021 0706   LEUKOCYTESUR NEGATIVE 04/05/2021 0706   Sepsis Labs Invalid input(s): PROCALCITONIN,  WBC,  LACTICIDVEN Microbiology Recent Results (from the past 240 hour(s))  Urine culture     Status: None   Collection Time: 04/04/21  4:58 PM   Specimen: In/Out Cath Urine  Result Value Ref Range Status   Specimen Description IN/OUT CATH URINE  Final   Special Requests NONE  Final   Culture   Final    NO GROWTH Performed at [facility], [place] [address] [place], [place] [address]    Report Status 04/06/2021 FINAL  Final  Resp Panel by RT-PCR (Flu A&B, Covid) Nasopharyngeal Swab     Status: None   Collection Time: 04/04/21  5:16 PM   Specimen: Nasopharyngeal Swab; Nasopharyngeal(NP) swabs in vial transport medium  Result Value Ref Range Status   SARS Coronavirus 2 by RT PCR NEGATIVE NEGATIVE Final    Comment: (NOTE) SARS-CoV-2 target nucleic acids are NOT DETECTED.  The SARS-CoV-2 RNA is generally detectable in upper respiratory specimens during the acute phase of infection. The  lowest concentration of SARS-CoV-2 viral copies this assay can detect is 138 copies/mL. A negative result does not preclude SARS-Cov-2 infection and should not be used as the sole basis for treatment or other patient management decisions. A negative result may occur with  improper specimen collection/handling, submission of specimen other than nasopharyngeal swab, presence of viral mutation(s) within the areas targeted by this assay, and inadequate number of viral copies(<138 copies/mL). A negative result must be combined with clinical observations, patient history, and epidemiological information. The expected result is Negative.  Fact Sheet for Patients:  EntrepreneurPulse.com.au  Fact Sheet for Healthcare Providers:  IncredibleEmployment.be  This test is no t yet approved or cleared by the [place] FDA and  has been authorized for detection and/or diagnosis of SARS-CoV-2 by FDA under an Emergency Use Authorization (EUA). This EUA will remain  in effect (meaning this test can be used) for the duration of the COVID-19 declaration under Section 564(b)(1) of the Act, 21 U.S.C.section 360bbb-3(b)(1), unless the authorization is terminated  or revoked sooner.       Influenza A by PCR NEGATIVE NEGATIVE Final   Influenza B by PCR NEGATIVE NEGATIVE Final    Comment: (NOTE) The Xpert Xpress SARS-CoV-2/FLU/RSV plus assay is intended as an aid in the diagnosis of influenza from Nasopharyngeal swab specimens and should not be used as a sole basis for treatment. Nasal washings and aspirates are unacceptable for Xpert Xpress SARS-CoV-2/FLU/RSV testing.  Fact Sheet for Patients: EntrepreneurPulse.com.au  Fact Sheet for Healthcare Providers:  IncredibleEmployment.be  This test is not yet approved or cleared by the [place] and has been authorized for detection and/or diagnosis of SARS-CoV-2 by FDA under  an Emergency Use Authorization (EUA). This EUA will remain in effect (meaning this test can be used) for the duration of the COVID-19 declaration under Section 564(b)(1) of the Act, 21 U.S.C. section 360bbb-3(b)(1), unless the authorization is terminated or revoked.  Performed at [facility], [place] [address] [place], [place] [address]   Blood Culture (routine x 2)     Status: None (Preliminary result)   Collection Time: 04/04/21  5:19 PM   Specimen: BLOOD RIGHT FOREARM  Result Value Ref Range Status   Specimen Description BLOOD RIGHT FOREARM  Final   Special Requests   Final    BOTTLES DRAWN AEROBIC AND ANAEROBIC Blood Culture adequate volume   Culture   Final    NO GROWTH 2 DAYS Performed at [facility], [place] [address], [place], [place] [address]    Report Status PENDING  Incomplete  Blood Culture (routine x 2)     Status: None (Preliminary result)   Collection Time: 04/04/21  6:05 PM   Specimen: BLOOD LEFT FOREARM  Result Value Ref Range Status   Specimen Description BLOOD LEFT FOREARM  Final   Special Requests   Final    BOTTLES DRAWN AEROBIC AND ANAEROBIC Blood Culture results may not be optimal due to an inadequate volume of blood received in culture bottles   Culture   Final    NO GROWTH 2 DAYS Performed at [facility], [place] [address] [place], [place] [address]    Report Status PENDING  Incomplete     Time coordinating discharge: Over 30 minutes  SIGNED:   Darliss Cheney, MD  [facility] 04/07/2021, 10:59 AM  If 7PM-7AM, please contact night-coverage [email or web address]

## 2021-04-07 NOTE — Progress Notes (Signed)
DISCHARGE NOTE HOME Elizabeth [name] to be discharged Home per MD order. Discussed prescriptions and follow up appointments with the patient. Prescriptions given to patient; medication list explained in detail. Patient verbalized understanding.  Skin clean, dry and intact without evidence of skin break down, no evidence of skin tears noted. IV catheter discontinued intact. Site without signs and symptoms of complications. Dressing and pressure applied. Pt denies pain at the site currently. No complaints noted.  Patient free of lines, drains, and wounds.   An After Visit Summary (AVS) was printed and given to the patient. Patient escorted via wheelchair, and discharged home via private auto.  Berneta Levins, RN

## 2021-04-07 NOTE — Progress Notes (Signed)
SATURATION QUALIFICATIONS: (This note is used to comply with regulatory documentation for home oxygen)  Patient Saturations on Room Air at Rest =100  Patient Saturations on Room Air while Ambulating = 95%  Patient tolerated ambulation on room air very well.

## 2021-04-07 NOTE — Progress Notes (Signed)
Patient ambulated on room air 95 O2 sat.

## 2021-04-09 LAB — CULTURE, BLOOD (ROUTINE X 2)
Culture: NO GROWTH
Culture: NO GROWTH
Special Requests: ADEQUATE

## 2021-04-15 DIAGNOSIS — J189 Pneumonia, unspecified organism: Secondary | ICD-10-CM | POA: Diagnosis not present

## 2021-04-15 DIAGNOSIS — R5383 Other fatigue: Secondary | ICD-10-CM | POA: Diagnosis not present

## 2021-05-16 DIAGNOSIS — J449 Chronic obstructive pulmonary disease, unspecified: Secondary | ICD-10-CM | POA: Diagnosis not present

## 2021-05-16 DIAGNOSIS — H7090 Unspecified mastoiditis, unspecified ear: Secondary | ICD-10-CM | POA: Diagnosis not present

## 2021-05-16 DIAGNOSIS — R011 Cardiac murmur, unspecified: Secondary | ICD-10-CM | POA: Diagnosis not present

## 2021-05-16 DIAGNOSIS — I10 Essential (primary) hypertension: Secondary | ICD-10-CM | POA: Diagnosis not present

## 2021-05-16 DIAGNOSIS — I351 Nonrheumatic aortic (valve) insufficiency: Secondary | ICD-10-CM | POA: Diagnosis not present

## 2021-05-16 DIAGNOSIS — Q453 Other congenital malformations of pancreas and pancreatic duct: Secondary | ICD-10-CM | POA: Diagnosis not present

## 2021-05-16 DIAGNOSIS — R413 Other amnesia: Secondary | ICD-10-CM | POA: Diagnosis not present

## 2021-06-03 DIAGNOSIS — H40033 Anatomical narrow angle, bilateral: Secondary | ICD-10-CM | POA: Diagnosis not present

## 2021-06-03 DIAGNOSIS — H43393 Other vitreous opacities, bilateral: Secondary | ICD-10-CM | POA: Diagnosis not present

## 2021-06-21 ENCOUNTER — Encounter (INDEPENDENT_AMBULATORY_CARE_PROVIDER_SITE_OTHER): Payer: Self-pay

## 2021-06-21 ENCOUNTER — Other Ambulatory Visit: Payer: Self-pay

## 2021-06-21 ENCOUNTER — Encounter (INDEPENDENT_AMBULATORY_CARE_PROVIDER_SITE_OTHER): Payer: Medicare Other | Admitting: Ophthalmology

## 2021-06-21 DIAGNOSIS — H43813 Vitreous degeneration, bilateral: Secondary | ICD-10-CM | POA: Diagnosis not present

## 2021-06-21 DIAGNOSIS — H35033 Hypertensive retinopathy, bilateral: Secondary | ICD-10-CM

## 2021-06-21 DIAGNOSIS — D3132 Benign neoplasm of left choroid: Secondary | ICD-10-CM

## 2021-06-21 DIAGNOSIS — I10 Essential (primary) hypertension: Secondary | ICD-10-CM

## 2021-06-25 DIAGNOSIS — K219 Gastro-esophageal reflux disease without esophagitis: Secondary | ICD-10-CM | POA: Diagnosis not present

## 2021-06-25 DIAGNOSIS — E7800 Pure hypercholesterolemia, unspecified: Secondary | ICD-10-CM | POA: Diagnosis not present

## 2021-06-25 DIAGNOSIS — M1990 Unspecified osteoarthritis, unspecified site: Secondary | ICD-10-CM | POA: Diagnosis not present

## 2021-06-25 DIAGNOSIS — M810 Age-related osteoporosis without current pathological fracture: Secondary | ICD-10-CM | POA: Diagnosis not present

## 2021-06-25 DIAGNOSIS — E782 Mixed hyperlipidemia: Secondary | ICD-10-CM | POA: Diagnosis not present

## 2021-06-25 DIAGNOSIS — I10 Essential (primary) hypertension: Secondary | ICD-10-CM | POA: Diagnosis not present

## 2021-06-25 DIAGNOSIS — J449 Chronic obstructive pulmonary disease, unspecified: Secondary | ICD-10-CM | POA: Diagnosis not present

## 2021-06-28 ENCOUNTER — Encounter (INDEPENDENT_AMBULATORY_CARE_PROVIDER_SITE_OTHER): Payer: Medicare Other | Admitting: Ophthalmology

## 2021-07-09 DIAGNOSIS — M48062 Spinal stenosis, lumbar region with neurogenic claudication: Secondary | ICD-10-CM | POA: Diagnosis not present

## 2021-07-09 DIAGNOSIS — I10 Essential (primary) hypertension: Secondary | ICD-10-CM | POA: Diagnosis not present

## 2021-07-11 DIAGNOSIS — Z780 Asymptomatic menopausal state: Secondary | ICD-10-CM | POA: Diagnosis not present

## 2021-07-11 DIAGNOSIS — Z1231 Encounter for screening mammogram for malignant neoplasm of breast: Secondary | ICD-10-CM | POA: Diagnosis not present

## 2021-07-11 DIAGNOSIS — M810 Age-related osteoporosis without current pathological fracture: Secondary | ICD-10-CM | POA: Diagnosis not present

## 2021-07-16 DIAGNOSIS — M48062 Spinal stenosis, lumbar region with neurogenic claudication: Secondary | ICD-10-CM | POA: Diagnosis not present

## 2021-07-16 DIAGNOSIS — M545 Low back pain, unspecified: Secondary | ICD-10-CM | POA: Diagnosis not present

## 2021-07-25 DIAGNOSIS — Z23 Encounter for immunization: Secondary | ICD-10-CM | POA: Diagnosis not present

## 2021-07-29 DIAGNOSIS — M4316 Spondylolisthesis, lumbar region: Secondary | ICD-10-CM | POA: Diagnosis not present

## 2021-07-29 DIAGNOSIS — R200 Anesthesia of skin: Secondary | ICD-10-CM | POA: Diagnosis not present

## 2021-08-06 ENCOUNTER — Other Ambulatory Visit (HOSPITAL_COMMUNITY): Payer: Self-pay | Admitting: Neurosurgery

## 2021-08-06 DIAGNOSIS — R200 Anesthesia of skin: Secondary | ICD-10-CM

## 2021-08-06 DIAGNOSIS — R202 Paresthesia of skin: Secondary | ICD-10-CM

## 2021-08-07 DIAGNOSIS — M810 Age-related osteoporosis without current pathological fracture: Secondary | ICD-10-CM | POA: Diagnosis not present

## 2021-08-12 ENCOUNTER — Ambulatory Visit (HOSPITAL_COMMUNITY)
Admission: RE | Admit: 2021-08-12 | Discharge: 2021-08-12 | Disposition: A | Discharge status: Home / Self Care | Payer: Medicare Other | Source: Ambulatory Visit | Attending: Internal Medicine | Admitting: Internal Medicine

## 2021-08-12 ENCOUNTER — Other Ambulatory Visit: Payer: Self-pay

## 2021-08-12 DIAGNOSIS — R202 Paresthesia of skin: Secondary | ICD-10-CM | POA: Diagnosis not present

## 2021-08-12 DIAGNOSIS — R200 Anesthesia of skin: Secondary | ICD-10-CM | POA: Insufficient documentation

## 2021-08-12 NOTE — Progress Notes (Signed)
ABI has been completed.   Preliminary results in CV Proc.   Elizabeth [name] 08/12/2021 2:38 PM

## 2021-08-22 DIAGNOSIS — G629 Polyneuropathy, unspecified: Secondary | ICD-10-CM | POA: Diagnosis not present

## 2021-09-04 DIAGNOSIS — I10 Essential (primary) hypertension: Secondary | ICD-10-CM | POA: Diagnosis not present

## 2021-09-04 DIAGNOSIS — F17290 Nicotine dependence, other tobacco product, uncomplicated: Secondary | ICD-10-CM | POA: Diagnosis not present

## 2021-09-04 DIAGNOSIS — M810 Age-related osteoporosis without current pathological fracture: Secondary | ICD-10-CM | POA: Diagnosis not present

## 2021-09-04 DIAGNOSIS — K219 Gastro-esophageal reflux disease without esophagitis: Secondary | ICD-10-CM | POA: Diagnosis not present

## 2021-09-04 DIAGNOSIS — I779 Disorder of arteries and arterioles, unspecified: Secondary | ICD-10-CM | POA: Diagnosis not present

## 2021-09-04 DIAGNOSIS — J449 Chronic obstructive pulmonary disease, unspecified: Secondary | ICD-10-CM | POA: Diagnosis not present

## 2021-09-04 DIAGNOSIS — E7800 Pure hypercholesterolemia, unspecified: Secondary | ICD-10-CM | POA: Diagnosis not present

## 2021-09-04 DIAGNOSIS — Z0001 Encounter for general adult medical examination with abnormal findings: Secondary | ICD-10-CM | POA: Diagnosis not present

## 2021-09-04 DIAGNOSIS — I351 Nonrheumatic aortic (valve) insufficiency: Secondary | ICD-10-CM | POA: Diagnosis not present

## 2021-09-04 DIAGNOSIS — E559 Vitamin D deficiency, unspecified: Secondary | ICD-10-CM | POA: Diagnosis not present

## 2021-09-08 NOTE — Progress Notes (Signed)
Office Note     CC: Claudication Requesting Provider:  [name], MD  HPI: Elizabeth [name] is a 80 y.o. (1941-03-03) female presenting at the request of .[name], MD for left lower extremity claudication.   On exam today, "Elizabeth [name]" was anxious regarding the results of her ankle-brachial index.  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  She describes her leg pain as left greater than right, occurring at rest, waking her up at night.  She denies wounds on the lower extremities.  Elizabeth [name] is able to walk roughly 20 feet prior to feeling like her legs are going to "give out" due to pain.   Patient is a non-smoker with significant family history of vascular disease.  Her mother's first vascular surgery occurred at the age of 54.  Multiple family members have died from stroke at an early age.   The pt is  on a statin for cholesterol management.  The pt is  on a daily aspirin.   Other AC:  - The pt is  on medication for hypertension.   The pt is not diabetic.  Tobacco hx:  Daily  Past Medical History:  Diagnosis Date   Abdominal cramping    possibly irritable bowel syndrome versus adhesions secondary to multiple surgeries and abdominal inflammation and 2009 secondary to diverticulitis and diverticular phlegmon   Allergic rhinitis    Arthritis    Carotid artery disease (HCC)    31-51% RICA, < 76% LICA 16/0/73 [name] ([facility])   Cataract    Chest pain syndrome 2004   with adenosine Cardiolite that was normal   Chronic hoarseness    COPD (chronic obstructive pulmonary disease) (HCC)    GERD (gastroesophageal reflux disease)    Heart murmur    Mild AS, AI. AV pk grad 18.79 mmHg, mn grad 11.20 mmHg, AVA (VTI) 1.05 cm2 10/10/20, 3 year f/u rec ([facility])   History of kidney stones    History of renal calculi    Hypercholesterolemia    Hypertension    Incontinence    Cough incontinence   Mild depression (HCC)     Neuromuscular disorder (HCC)    neuropathy bilateral lower extremities   Osteopenia    Peripheral vascular disease ([place])    with femoral and carotid bruits   PONV (postoperative nausea and vomiting)    Tobacco dependence    Vitamin D deficiency     Past Surgical History:  Procedure Laterality Date   APPENDECTOMY     BREAST BIOPSY     CATARACT EXTRACTION, BILATERAL     COLON SURGERY     2009   COSMETIC SURGERY     on neck and eyes   DILATION AND CURETTAGE OF UTERUS     EYE SURGERY     2014   FEMUR SURGERY  12/2007   left femur surgery--for femoral neck fracture status post fall    FRACTURE SURGERY     broke left leg and left arm- 2012   LIPOSUCTION     LUMBAR LAMINECTOMY/DECOMPRESSION MICRODISCECTOMY N/A 11/12/2020   Procedure: LAMINECTOMY AND FORAMINOTOMY Lumbar three four, Lumbar four five, Lumbar Five Sacral one;  Surgeon: Newman Pies, MD;  Location: [facility] [facility];  Service: Neurosurgery;  Laterality: N/A;   OTHER SURGICAL HISTORY     sigmoid colectomy   SHOULDER SURGERY     arthroscopic shoulder surgery    TONSILLECTOMY     TUBAL LIGATION  Social History   Socioeconomic History   Marital status: Widowed    Spouse name: Not on file   Number of children: Not on file   Years of education: Not on file   Highest education level: Not on file  Occupational History   Not on file  Tobacco Use   Smoking status: Every Day    Packs/day: 0.50    Years: 60.00    Pack years: 30.00    Types: Cigarettes   Smokeless tobacco: Never  Vaping Use   Vaping Use: Never used  Substance and Sexual Activity   Alcohol use: Yes    Alcohol/week: 0.0 standard drinks    Comment: Occ glass of wine   Drug use: No   Sexual activity: Not on file  Other Topics Concern   Not on file  Social History Narrative   Not on file   Social Determinants of Health   Financial Resource Strain: Not on file  Food Insecurity: Not on file  Transportation Needs: Not on file  Physical Activity: Not  on file  Stress: Not on file  Social Connections: Not on file  Intimate Partner Violence: Not on file    Family History  Problem Relation Age of Onset   Heart disease Mother    Stroke Mother     Current Outpatient Medications  Medication Sig Dispense Refill   acetaminophen (TYLENOL) 500 MG tablet Take 500-1,000 mg by mouth every 6 (six) hours as needed for headache (pain).     albuterol (VENTOLIN HFA) 108 (90 Base) MCG/ACT inhaler Inhale 2 puffs into the lungs every 6 (six) hours as needed for wheezing or shortness of breath. 8 g 2   amLODipine (NORVASC) 5 MG tablet Take 5 mg by mouth at bedtime.     aspirin EC 81 MG tablet Take 81 mg by mouth at bedtime. Swallow whole.     cloNIDine (CATAPRES) 0.1 MG tablet Take 0.1 mg by mouth at bedtime.      Coenzyme Q10 100 MG capsule Take 100 mg by mouth every morning.     famotidine (PEPCID) 20 MG tablet Take 20-40 mg by mouth at bedtime.     gabapentin (NEURONTIN) 100 MG capsule Take 2 capsules (200 mg total) by mouth at bedtime. PATIENT NEEDS OFFICE VISIT FOR ADDITIONAL REFILLS (Patient taking differently: Take 100-200 mg by mouth See admin instructions. Take one capsule (100 mg) by mouth every morning and two capsules (200 mg) at night) 60 capsule 0   HYDROcodone-acetaminophen (NORCO/VICODIN) 5-325 MG tablet Take 1 tablet by mouth every 4 (four) hours as needed. (Patient not taking: No sig reported) 10 tablet 0   INCRUSE ELLIPTA 62.5 MCG/INH AEPB Inhale 1 puff into the lungs daily. 30 each 1   irbesartan-hydrochlorothiazide (AVALIDE) 150-12.5 MG per tablet Take 1 tablet by mouth every morning.     meloxicam (MOBIC) 15 MG tablet Take 15 mg by mouth See admin instructions. Take one tablet (15 mg) by mouth once every few days as needed for arthritis pain     metoprolol succinate (TOPROL-XL) 25 MG 24 hr tablet Take 25 mg by mouth at bedtime.     Multiple Vitamin (MULTIVITAMIN WITH MINERALS) TABS tablet Take 1 tablet by mouth 2 (two) times daily.      ondansetron (ZOFRAN ODT) 4 MG disintegrating tablet Take 1 tablet (4 mg total) by mouth every 8 (eight) hours as needed for nausea or vomiting. 20 tablet 0   simvastatin (ZOCOR) 40 MG tablet Take 40 mg by  mouth at bedtime.     Turmeric 500 MG CAPS Take 500 mg by mouth every morning.     vitamin B-12 (CYANOCOBALAMIN) 100 MCG tablet Take 100 mcg by mouth every morning.     No current facility-administered medications for this visit.    Allergies  Allergen Reactions   Other Other (See Comments)    Chlorexolone - unknown reaction   Pollen Extract     Allergic to tree pollens   Tegretol [Carbamazepine] Other (See Comments)    Unknown reaction   Lisinopril Rash   Tetracyclines & Related Rash     REVIEW OF SYSTEMS:   [X]  denotes positive finding, [ ]  denotes negative finding Cardiac  Comments:  Chest pain or chest pressure:    Shortness of breath upon exertion:    Short of breath when lying flat:    Irregular heart rhythm:        Vascular    Pain in calf, thigh, or hip brought on by ambulation:    Pain in feet at night that wakes you up from your sleep:     Blood clot in your veins:    Leg swelling:         Pulmonary    Oxygen at home:    Productive cough:     Wheezing:         Neurologic    Sudden weakness in arms or legs:     Sudden numbness in arms or legs:     Sudden onset of difficulty speaking or slurred speech:    Temporary loss of vision in one eye:     Problems with dizziness:         Gastrointestinal    Blood in stool:     Vomited blood:         Genitourinary    Burning when urinating:     Blood in urine:        Psychiatric    Major depression:         Hematologic    Bleeding problems:    Problems with blood clotting too easily:        Skin    Rashes or ulcers:        Constitutional    Fever or chills:      PHYSICAL EXAMINATION:  There were no vitals filed for this visit.  General:  WDWN in NAD; vital signs documented above Gait: Not  observed HENT: WNL, normocephalic Pulmonary: normal non-labored breathing , without Rales, rhonchi,  wheezing Cardiac: regular HR,  Abdomen: soft, NT, no masses Skin: without rashes Vascular Exam/Pulses:  Right Left  Radial 2+ (normal) 2+ (normal)  Ulnar 2+ (normal) 2+ (normal)  Femoral 2+ (normal) 1+ (weak)  Popliteal    DP 1+ (weak) absent  PT absent absent   Extremities: without ischemic changes, without Gangrene , without cellulitis; without open wounds;  Musculoskeletal: no muscle wasting or atrophy  Neurologic: A&O X 3;  No focal weakness or paresthesias are detected Psychiatric:  The pt has Normal affect.   Non-Invasive Vascular Imaging: Significant progression since last ABI and 2017  +-------+-----------+-----------+------------+------------+  ABI/TBIToday's ABIToday's TBIPrevious ABIPrevious TBI  +-------+-----------+-----------+------------+------------+  Right  0.60       0.34                                 +-------+-----------+-----------+------------+------------+  Left   0.32       0.05                                 +-------+-----------+-----------+------------+------------+  ASSESSMENT/PLAN: Elizabeth [name] is a 80 y.o. female presenting with rest pain in the left leg.  ABIs were independently reviewed demonstrating severe peripheral arterial disease on the left with nearly-absent toe pressure.  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 After discussing the risks and benefits of left lower extremity diagnostic angiography with possible intervention, in an effort to define and treat poor perfusion to the leg, she elected to proceed. She was asked to continue taking aspirin, statin daily We reviewed smoking cessation  We will see the patient for angiography next Wednesday.   [name], MD [facility] [phone]

## 2021-09-09 ENCOUNTER — Encounter: Payer: Self-pay | Admitting: Vascular Surgery

## 2021-09-09 ENCOUNTER — Other Ambulatory Visit: Payer: Self-pay

## 2021-09-09 ENCOUNTER — Ambulatory Visit (INDEPENDENT_AMBULATORY_CARE_PROVIDER_SITE_OTHER): Payer: Medicare Other | Admitting: Vascular Surgery

## 2021-09-09 VITALS — BP 125/58 | HR 66 | Temp 97.8°F | Resp 14 | Ht 62.0 in | Wt 108.0 lb

## 2021-09-09 DIAGNOSIS — I70222 Atherosclerosis of native arteries of extremities with rest pain, left leg: Secondary | ICD-10-CM | POA: Diagnosis not present

## 2021-09-10 ENCOUNTER — Other Ambulatory Visit: Payer: Self-pay

## 2021-09-18 ENCOUNTER — Encounter (HOSPITAL_COMMUNITY)
Admission: RE | Disposition: A | Discharge status: Home / Self Care | Payer: Self-pay | Source: Home / Self Care | Attending: Vascular Surgery

## 2021-09-18 ENCOUNTER — Ambulatory Visit (HOSPITAL_COMMUNITY)
Admission: RE | Admit: 2021-09-18 | Discharge: 2021-09-18 | Disposition: A | Discharge status: Home / Self Care | Payer: Medicare Other | Attending: Vascular Surgery | Admitting: Vascular Surgery

## 2021-09-18 ENCOUNTER — Other Ambulatory Visit: Payer: Self-pay

## 2021-09-18 ENCOUNTER — Encounter (HOSPITAL_COMMUNITY): Payer: Self-pay | Admitting: Vascular Surgery

## 2021-09-18 DIAGNOSIS — I7143 Infrarenal abdominal aortic aneurysm, without rupture: Secondary | ICD-10-CM

## 2021-09-18 DIAGNOSIS — I70223 Atherosclerosis of native arteries of extremities with rest pain, bilateral legs: Secondary | ICD-10-CM | POA: Diagnosis not present

## 2021-09-18 DIAGNOSIS — I10 Essential (primary) hypertension: Secondary | ICD-10-CM | POA: Diagnosis not present

## 2021-09-18 DIAGNOSIS — I70211 Atherosclerosis of native arteries of extremities with intermittent claudication, right leg: Secondary | ICD-10-CM

## 2021-09-18 DIAGNOSIS — I70222 Atherosclerosis of native arteries of extremities with rest pain, left leg: Secondary | ICD-10-CM

## 2021-09-18 DIAGNOSIS — Z79899 Other long term (current) drug therapy: Secondary | ICD-10-CM | POA: Diagnosis not present

## 2021-09-18 DIAGNOSIS — I7409 Other arterial embolism and thrombosis of abdominal aorta: Secondary | ICD-10-CM

## 2021-09-18 DIAGNOSIS — Z7982 Long term (current) use of aspirin: Secondary | ICD-10-CM | POA: Diagnosis not present

## 2021-09-18 HISTORY — PX: ABDOMINAL AORTOGRAM W/LOWER EXTREMITY: CATH118223

## 2021-09-18 LAB — POCT I-STAT, CHEM 8
BUN: 17 mg/dL (ref 8–23)
Calcium, Ion: 1.23 mmol/L (ref 1.15–1.40)
Chloride: 102 mmol/L (ref 98–111)
Creatinine, Ser: 1.1 mg/dL — ABNORMAL HIGH (ref 0.44–1.00)
Glucose, Bld: 90 mg/dL (ref 70–99)
HCT: 36 % (ref 36.0–46.0)
Hemoglobin: 12.2 g/dL (ref 12.0–15.0)
Potassium: 4 mmol/L (ref 3.5–5.1)
Sodium: 142 mmol/L (ref 135–145)
TCO2: 29 mmol/L (ref 22–32)

## 2021-09-18 SURGERY — ABDOMINAL AORTOGRAM W/LOWER EXTREMITY
Anesthesia: LOCAL

## 2021-09-18 MED ORDER — SODIUM CHLORIDE 0.9% FLUSH: 3.0000 mL | Freq: Two times a day (BID) | INTRAVENOUS | Status: DC

## 2021-09-18 MED ORDER — HYDRALAZINE HCL 20 MG/ML IJ SOLN: 5.0000 mg | INTRAMUSCULAR | Status: DC | PRN

## 2021-09-18 MED ORDER — IODIXANOL 320 MG/ML IV SOLN
INTRAVENOUS | Status: DC | PRN
  Administered 2021-09-18: 95 mL

## 2021-09-18 MED ORDER — FENTANYL CITRATE (PF) 100 MCG/2ML IJ SOLN
INTRAMUSCULAR | Status: DC | PRN
  Administered 2021-09-18: 50 ug via INTRAVENOUS

## 2021-09-18 MED ORDER — SODIUM CHLORIDE 0.9 % WEIGHT BASED INFUSION: 1.0000 mL/kg/h | INTRAVENOUS | Status: DC

## 2021-09-18 MED ORDER — MIDAZOLAM HCL 2 MG/2ML IJ SOLN
INTRAMUSCULAR | Status: DC | PRN
  Administered 2021-09-18: 1 mg via INTRAVENOUS

## 2021-09-18 MED ORDER — FENTANYL CITRATE (PF) 100 MCG/2ML IJ SOLN
INTRAMUSCULAR | Status: AC
  Filled 2021-09-18: qty 2

## 2021-09-18 MED ORDER — LIDOCAINE HCL (PF) 1 % IJ SOLN
INTRAMUSCULAR | Status: DC | PRN
  Administered 2021-09-18: 15 mL

## 2021-09-18 MED ORDER — SODIUM CHLORIDE 0.9 % IV SOLN: INTRAVENOUS | Status: DC

## 2021-09-18 MED ORDER — SODIUM CHLORIDE 0.9 % IV SOLN: 250.0000 mL | INTRAVENOUS | Status: DC | PRN

## 2021-09-18 MED ORDER — ACETAMINOPHEN 325 MG PO TABS: 650.0000 mg | ORAL_TABLET | ORAL | Status: DC | PRN

## 2021-09-18 MED ORDER — HEPARIN (PORCINE) IN NACL 1000-0.9 UT/500ML-% IV SOLN
INTRAVENOUS | Status: AC
  Filled 2021-09-18: qty 500

## 2021-09-18 MED ORDER — SODIUM CHLORIDE 0.9% FLUSH: 3.0000 mL | INTRAVENOUS | Status: DC | PRN

## 2021-09-18 MED ORDER — MIDAZOLAM HCL 2 MG/2ML IJ SOLN
INTRAMUSCULAR | Status: AC
  Filled 2021-09-18: qty 2

## 2021-09-18 MED ORDER — HEPARIN (PORCINE) IN NACL 1000-0.9 UT/500ML-% IV SOLN
INTRAVENOUS | Status: DC | PRN
  Administered 2021-09-18 (×2): 500 mL

## 2021-09-18 MED ORDER — LABETALOL HCL 5 MG/ML IV SOLN: 10.0000 mg | INTRAVENOUS | Status: DC | PRN

## 2021-09-18 MED ORDER — ONDANSETRON HCL 4 MG/2ML IJ SOLN: 4.0000 mg | Freq: Four times a day (QID) | INTRAMUSCULAR | Status: DC | PRN

## 2021-09-18 MED ORDER — LIDOCAINE HCL (PF) 1 % IJ SOLN
INTRAMUSCULAR | Status: AC
  Filled 2021-09-18: qty 30

## 2021-09-18 SURGICAL SUPPLY — 12 items
CATH ANGIO 5F PIGTAIL 65CM (CATHETERS) ×3 IMPLANT
CATH CROSS OVER TEMPO 5F (CATHETERS) ×2 IMPLANT
DEVICE CLOSURE MYNXGRIP 5F (Vascular Products) ×2 IMPLANT
GLIDEWIRE ADV .035X260CM (WIRE) ×2 IMPLANT
KIT MICROPUNCTURE NIT STIFF (SHEATH) ×3 IMPLANT
KIT PV (KITS) ×3 IMPLANT
SHEATH PINNACLE 5F 10CM (SHEATH) ×3 IMPLANT
SHEATH PROBE COVER 6X72 (BAG) ×3 IMPLANT
SYR MEDRAD MARK 7 150ML (SYRINGE) ×3 IMPLANT
TRANSDUCER W/STOPCOCK (MISCELLANEOUS) ×3 IMPLANT
TRAY PV CATH (CUSTOM PROCEDURE TRAY) ×3 IMPLANT
WIRE BENTSON .035X145CM (WIRE) ×2 IMPLANT

## 2021-09-18 NOTE — H&P (Signed)
Patient was seen in preoperative holding prior to left lower extremity angiogram, with possible intervention Continues to have rest pain in the left lower extremity, waking her up at night. No changes in medical history since seen last in clinic, or physical exam Continues to smoke, we had a long discussion preoperatively that she needs to work toward cessation.  I will offer her adjuncts after the procedure. Current medications aspirin, statin I have discussing the risk and benefits of left lower extremity angiography, with possible intervention to improve perfusion to the left foot, Elizabeth [name] elected to proceed.  [name] MD   CC: Claudication Requesting Provider:  No ref. provider found  HPI: Elizabeth [name] is a 80 y.o. (19-Mar-1941) female presenting at the request of .[name], MD for left lower extremity claudication.   On exam today, "Elizabeth [name]" was anxious regarding the results of her ankle-brachial index.  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  She describes her leg pain as left greater than right, occurring at rest, waking her up at night.  She denies wounds on the lower extremities.  Elizabeth [name] is able to walk roughly 20 feet prior to feeling like her legs are going to "give out" due to pain.   Patient is a non-smoker with significant family history of vascular disease.  Her mother's first vascular surgery occurred at the age of 30.  Multiple family members have died from stroke at an early age.   The pt is  on a statin for cholesterol management.  The pt is  on a daily aspirin.   Other AC:  - The pt is  on medication for hypertension.   The pt is not diabetic.  Tobacco hx:  Daily  Past Medical History:  Diagnosis Date   Abdominal cramping    possibly irritable bowel syndrome versus adhesions secondary to multiple surgeries and abdominal inflammation and 2009 secondary to diverticulitis and diverticular  phlegmon   Allergic rhinitis    Arthritis    Carotid artery disease (HCC)    34-74% RICA, < 25% LICA 95/6/38 [name] ([facility])   Cataract    Chest pain syndrome 2004   with adenosine Cardiolite that was normal   Chronic hoarseness    COPD (chronic obstructive pulmonary disease) (HCC)    GERD (gastroesophageal reflux disease)    Heart murmur    Mild AS, AI. AV pk grad 18.79 mmHg, mn grad 11.20 mmHg, AVA (VTI) 1.05 cm2 10/10/20, 3 year f/u rec ([facility])   History of kidney stones    History of renal calculi    Hypercholesterolemia    Hypertension    Incontinence    Cough incontinence   Mild depression    Neuromuscular disorder (HCC)    neuropathy bilateral lower extremities   Osteopenia    Peripheral vascular disease ([place])    with femoral and carotid bruits   PONV (postoperative nausea and vomiting)    Tobacco dependence    Vitamin D deficiency     Past Surgical History:  Procedure Laterality Date   APPENDECTOMY     BREAST BIOPSY     CATARACT EXTRACTION, BILATERAL     COLON SURGERY     2009   COSMETIC SURGERY     on neck and eyes   DILATION AND CURETTAGE OF UTERUS     EYE SURGERY     2014   FEMUR SURGERY  12/2007   left  femur surgery--for femoral neck fracture status post fall    FRACTURE SURGERY     broke left leg and left arm- 2012   LIPOSUCTION     LUMBAR LAMINECTOMY/DECOMPRESSION MICRODISCECTOMY N/A 11/12/2020   Procedure: LAMINECTOMY AND FORAMINOTOMY Lumbar three four, Lumbar four five, Lumbar Five Sacral one;  Surgeon: Newman Pies, MD;  Location: [facility];  Service: Neurosurgery;  Laterality: N/A;   OTHER SURGICAL HISTORY     sigmoid colectomy   SHOULDER SURGERY     arthroscopic shoulder surgery    TONSILLECTOMY     TUBAL LIGATION      Social History   Socioeconomic History   Marital status: Widowed    Spouse name: Not on file   Number of children: Not on file   Years of education: Not on file   Highest education level: Not on file  Occupational  History   Not on file  Tobacco Use   Smoking status: Every Day    Packs/day: 0.50    Years: 60.00    Pack years: 30.00    Types: Cigarettes   Smokeless tobacco: Never  Vaping Use   Vaping Use: Never used  Substance and Sexual Activity   Alcohol use: Yes    Alcohol/week: 0.0 standard drinks    Comment: Occ glass of wine   Drug use: No   Sexual activity: Not on file  Other Topics Concern   Not on file  Social History Narrative   Not on file   Social Determinants of Health   Financial Resource Strain: Not on file  Food Insecurity: Not on file  Transportation Needs: Not on file  Physical Activity: Not on file  Stress: Not on file  Social Connections: Not on file  Intimate Partner Violence: Not on file    Family History  Problem Relation Age of Onset   Heart disease Mother    Stroke Mother     Current Facility-Administered Medications  Medication Dose Route Frequency Provider Last Rate Last Admin   0.9 %  sodium chloride infusion   Intravenous Continuous [name], MD 100 mL/hr at 09/18/21 0935 New Bag at 09/18/21 0935    Allergies  Allergen Reactions   Other Other (See Comments)    Chlorexolone - unknown reaction   Pollen Extract     Allergic to tree pollens   Tegretol [Carbamazepine] Other (See Comments)    Unknown reaction   Lisinopril Rash   Tetracyclines & Related Rash     REVIEW OF SYSTEMS:   [X]  denotes positive finding, [ ]  denotes negative finding Cardiac  Comments:  Chest pain or chest pressure:    Shortness of breath upon exertion:    Short of breath when lying flat:    Irregular heart rhythm:        Vascular    Pain in calf, thigh, or hip brought on by ambulation:    Pain in feet at night that wakes you up from your sleep:     Blood clot in your veins:    Leg swelling:         Pulmonary    Oxygen at home:    Productive cough:     Wheezing:         Neurologic    Sudden weakness in arms or legs:     Sudden numbness in arms or  legs:     Sudden onset of difficulty speaking or slurred speech:    Temporary loss of vision in one eye:  Problems with dizziness:         Gastrointestinal    Blood in stool:     Vomited blood:         Genitourinary    Burning when urinating:     Blood in urine:        Psychiatric    Major depression:         Hematologic    Bleeding problems:    Problems with blood clotting too easily:        Skin    Rashes or ulcers:        Constitutional    Fever or chills:      PHYSICAL EXAMINATION:  Vitals:   09/18/21 0904  BP: (!) 120/51  Pulse: 62  Resp: 15  Temp: 97.9 F (36.6 C)  TempSrc: Oral  SpO2: 100%  Weight: 49 kg  Height: 5' (1.524 m)    General:  WDWN in NAD; vital signs documented above Gait: Not observed HENT: WNL, normocephalic Pulmonary: normal non-labored breathing , without Rales, rhonchi,  wheezing Cardiac: regular HR,  Abdomen: soft, NT, no masses Skin: without rashes Vascular Exam/Pulses:  Right Left  Radial 2+ (normal) 2+ (normal)  Ulnar 2+ (normal) 2+ (normal)  Femoral 2+ (normal) 1+ (weak)  Popliteal    DP 1+ (weak) absent  PT absent absent   Extremities: without ischemic changes, without Gangrene , without cellulitis; without open wounds;  Musculoskeletal: no muscle wasting or atrophy  Neurologic: A&O X 3;  No focal weakness or paresthesias are detected Psychiatric:  The pt has Normal affect.   Non-Invasive Vascular Imaging: Significant progression since last ABI and 2017  +-------+-----------+-----------+------------+------------+  ABI/TBIToday's ABIToday's TBIPrevious ABIPrevious TBI  +-------+-----------+-----------+------------+------------+  Right  0.60       0.34                                 +-------+-----------+-----------+------------+------------+  Left   0.32       0.05                                 +-------+-----------+-----------+------------+------------+     ASSESSMENT/PLAN: SOFIYA EZELLE is  a 80 y.o. female presenting with rest pain in the left leg.  ABIs were independently reviewed demonstrating severe peripheral arterial disease on the left with nearly-absent toe pressure.  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 After discussing the risks and benefits of left lower extremity diagnostic angiography with possible intervention, in an effort to define and treat poor perfusion to the leg, she elected to proceed. She was asked to continue taking aspirin, statin daily We reviewed smoking cessation  We will see the patient for angiography next Wednesday.   [name], MD [facility] [phone]

## 2021-09-18 NOTE — Op Note (Signed)
    Patient name: Elizabeth [name] MRN: [id number] DOB: [birth year] Sex: female  09/18/2021 Pre-operative Diagnosis: Left leg Rutherford 4 critical limb ischemia, right leg Rutherford 3 critical limb ischemia Post-operative diagnosis:  Same Surgeon:  [name], MD Procedure Performed: 1.  Ultrasound-guided micropuncture access of the right common femoral artery in retrograde fashion 2.  Aortogram 3.  Bilateral lower extremity angiogram 4.  Device assisted closure-Mynx 5.  Sedation 29 minutes. Contrast 39ml   Indications:   Patient is an 80 year old female who presented to clinic with Rutherford 4 critical limb ischemia in the left leg, Rutherford 3 critical limb ischemia in the right leg.  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  Bilateral lower extremity claudication, has limited her ambulation.  She is unable to sleep at night due to rest pain in the left leg.  Findings:   Aortogram: Patent bilateral renal arteries, right renal artery with high-grade stenosis 1 cm from its ostia.  Left renal artery without stenosis.  Infrarenal abdominal aorta ectatic, with diffuse, severe, atherosclerotic disease. 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  On the right: Common iliac artery with flow-limiting stenosis.  Normal internal iliac artery, normal external iliac artery.  Normal common femoral artery, profunda.  No flow limiting stenosis appreciated in the superficial femoral artery, popliteal artery.  Tibial trifurcation patent with brisk posterior tibial artery runoff into the foot.  Sluggish anterior tibial, peroneal artery runoff.  On the left: Proximal common iliac artery nearly occluded.  Internal iliac artery occluded.  External iliac artery patent, normal common femoral artery, normal profunda.  No flow-limiting stenosis in the superficial femoral artery, popliteal artery.  High takeoff of the anterior tibial artery from the P2  segment of the popliteal artery.  Posterior tibial and peroneal arteries patent.  Brisk posterior tibial artery runoff to the foot, with sluggish peroneal artery, anterior tibial artery runoff.    Procedure:  The patient was identified in the holding area and taken to room 8.  The patient was then placed supine on the table and prepped and draped in the usual sterile fashion.  A time out was called.  Ultrasound was used to evaluate the right common femoral artery.  It was patent .  A digital ultrasound image was acquired.  A micropuncture needle was used to access the right common femoral artery under ultrasound guidance.  An 018 wire was advanced without resistance and a micropuncture sheath was placed.  The 018 wire was removed and a benson wire was placed.  The micropuncture sheath was exchanged for a 5 french sheath.  An omniflush catheter was advanced over the wire to the level of L-1.  An abdominal angiogram was obtained.  Next, the flush catheter was pulled down to the terminal aorta for bilateral lower extremity angiogram.  There was no intervention. The patient's right-sided common femoral artery access was managed via minx device without issue. ___________   I had a long discussion with the patient postoperatively regarding her angiogram. 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 The patient needs a CT angio abdomen pelvis, and cardiology visit for preoperative risk assessment prior to see me back in clinic.   I discussed her case with Dr. Fletcher Anon, who will see her in cardiology clinic.   Elizabeth Santee, MD [facility] [place] Office: [phone]

## 2021-09-19 ENCOUNTER — Other Ambulatory Visit: Payer: Self-pay

## 2021-09-19 DIAGNOSIS — I70222 Atherosclerosis of native arteries of extremities with rest pain, left leg: Secondary | ICD-10-CM

## 2021-09-20 ENCOUNTER — Telehealth: Payer: Self-pay | Admitting: *Deleted

## 2021-09-20 NOTE — Telephone Encounter (Signed)
Appointment made for 09/24/21 with Dr. Fletcher Anon

## 2021-09-20 NOTE — Telephone Encounter (Signed)
Left a message for the patient to call back. Message received that she needs a new patient appointment with Dr. Fletcher Anon. One has been saved on 11/22 at 11 am

## 2021-09-21 IMAGING — DX DG CHEST 1V PORT
1 series · 1 of 1 positions shown · non-contrast
Comparison: 07/17/2016 and prior.

CLINICAL DATA: Acute respiratory failure with hypoxia.

EXAM:
PORTABLE CHEST 1 VIEW

[chest ap]
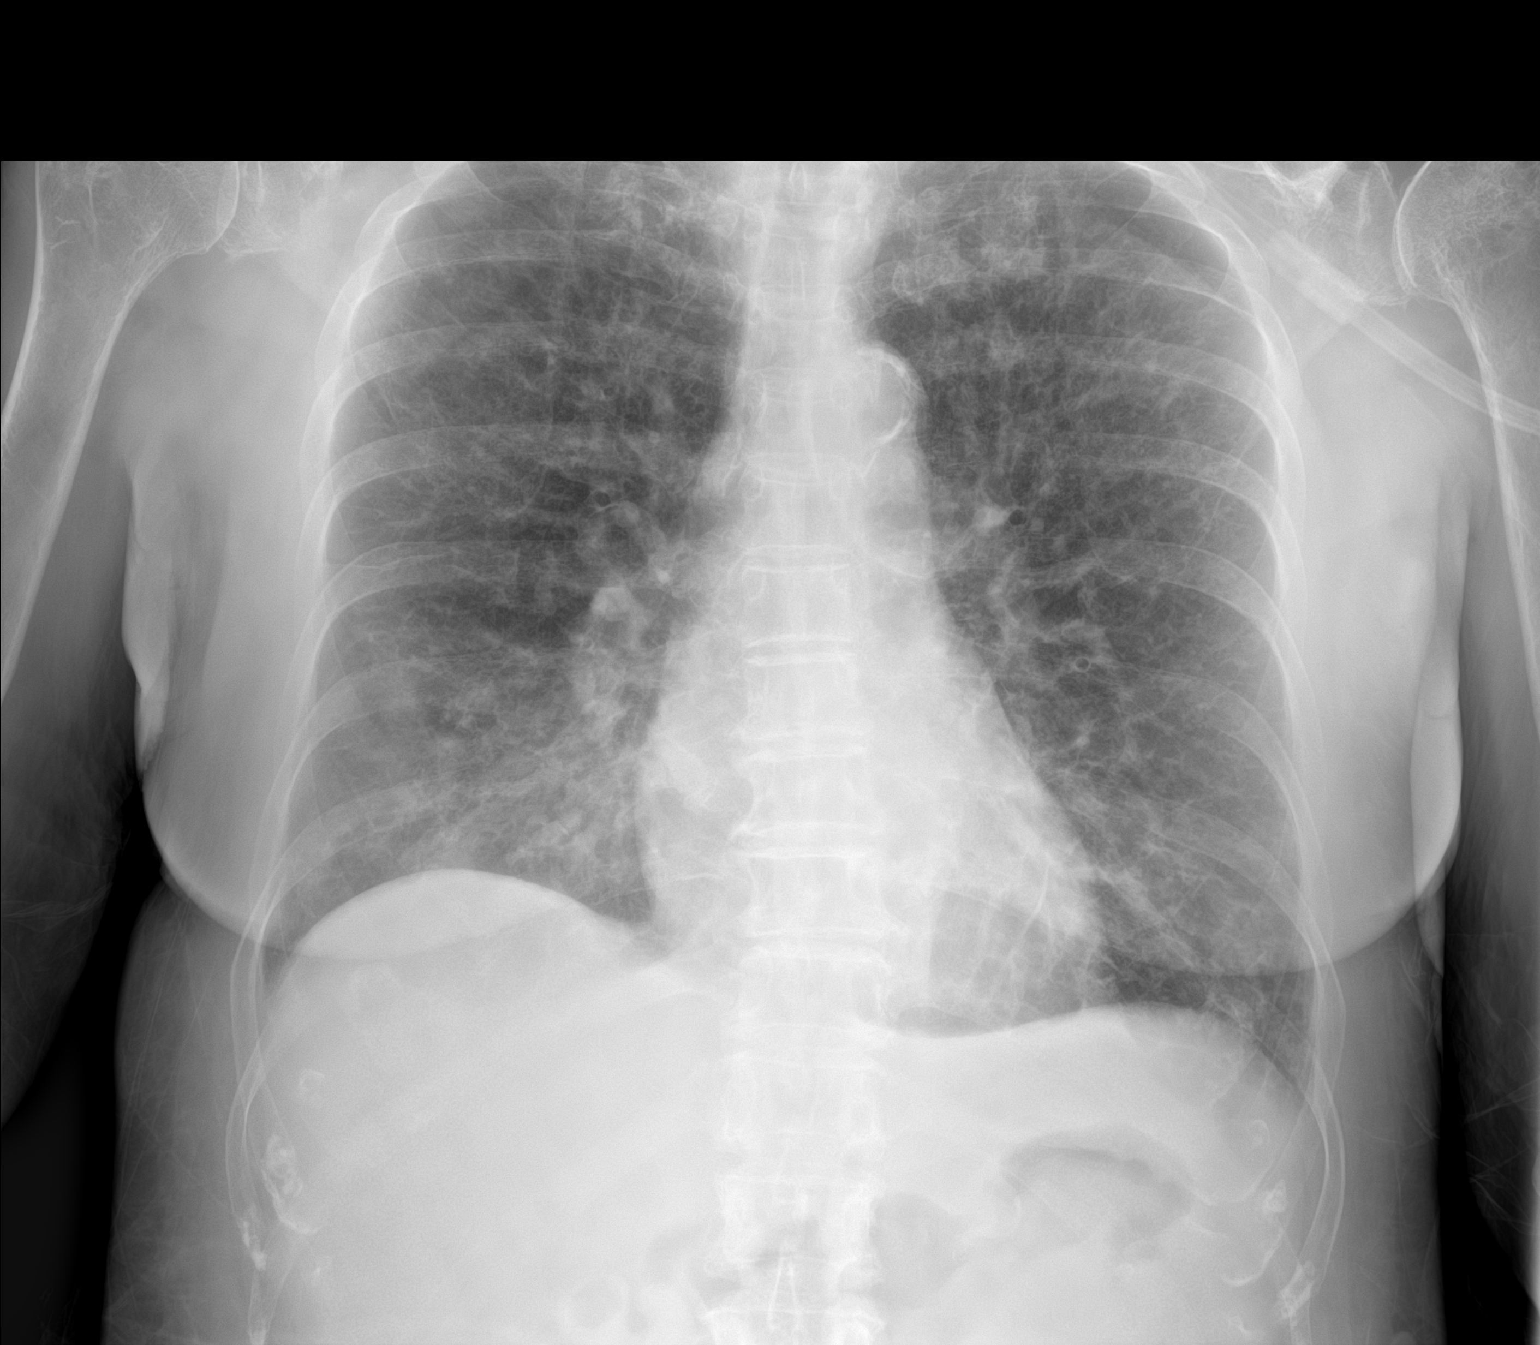

[1 of 1 positions shown; findings below may reference images not displayed]

FINDINGS: No pneumothorax or pleural effusion. Diffuse interstitial prominence
with patchy bibasilar opacities. Cardiomediastinal silhouette within
normal limits. No acute osseous abnormality.
IMPRESSION: Interstitial prominence and patchy basilar opacities. Differential
includes infection versus chronic sequela with atelectasis.

## 2021-09-24 ENCOUNTER — Other Ambulatory Visit: Payer: Self-pay

## 2021-09-24 ENCOUNTER — Ambulatory Visit (INDEPENDENT_AMBULATORY_CARE_PROVIDER_SITE_OTHER): Payer: Medicare Other | Admitting: Cardiovascular Disease

## 2021-09-24 ENCOUNTER — Encounter: Payer: Self-pay | Admitting: Cardiovascular Disease

## 2021-09-24 VITALS — BP 134/54 | HR 68 | Ht 62.0 in | Wt 110.2 lb

## 2021-09-24 DIAGNOSIS — I350 Nonrheumatic aortic (valve) stenosis: Secondary | ICD-10-CM | POA: Diagnosis not present

## 2021-09-24 DIAGNOSIS — Z01810 Encounter for preprocedural cardiovascular examination: Secondary | ICD-10-CM

## 2021-09-24 DIAGNOSIS — I10 Essential (primary) hypertension: Secondary | ICD-10-CM

## 2021-09-24 DIAGNOSIS — R0609 Other forms of dyspnea: Secondary | ICD-10-CM

## 2021-09-24 NOTE — Progress Notes (Signed)
Cardiology Office Note   Date:  09/25/2021   ID:  Elizabeth [name], DOB 03-May-1941, MRN [id number]  PCP:  [name], MD  Cardiologist:   Kathlyn Sacramento, MD   No chief complaint on file.     History of Present Illness: Elizabeth [name] is a 80 y.o. female who was referred by Dr. Virl Cagey for preop cardiovascular evaluation before anticipated aortobifemoral bypass. Has history of mild aortic stenosis but no other cardiac history.  No history of ischemic heart disease.  She has multiple chronic medical conditions including essential hypertension, hyperlipidemia, prolonged history of tobacco use and history of pancreatitis.  She has strong family history of cardiovascular disease.  She had a Lexiscan Myoview in 2015 that showed no evidence of ischemia.  She had back surgery earlier this year without complications.  She reports progressive symptoms of bilateral leg claudication.  Symptoms progressed to rest pain.  No lower extremity ulceration.  She had recent angiography which showed severe aortoiliac disease. She denies any chest pain.  However, she does report shortness of breath with long walks.  No orthopnea, PND or leg edema.    Past Medical History:  Diagnosis Date   Abdominal cramping    possibly irritable bowel syndrome versus adhesions secondary to multiple surgeries and abdominal inflammation and 2009 secondary to diverticulitis and diverticular phlegmon   Allergic rhinitis    Arthritis    Carotid artery disease (HCC)    40-98% RICA, < 11% LICA 91/4/78 [name] ([facility])   Cataract    Chest pain syndrome 2004   with adenosine Cardiolite that was normal   Chronic hoarseness    COPD (chronic obstructive pulmonary disease) (HCC)    GERD (gastroesophageal reflux disease)    Heart murmur    Mild AS, AI. AV pk grad 18.79 mmHg, mn grad 11.20 mmHg, AVA (VTI) 1.05 cm2 10/10/20, 3 year f/u rec ([facility])   History of kidney stones    History of renal calculi    Hypercholesterolemia     Hypertension    Incontinence    Cough incontinence   Mild depression    Neuromuscular disorder (HCC)    neuropathy bilateral lower extremities   Osteopenia    Peripheral vascular disease ([place])    with femoral and carotid bruits   PONV (postoperative nausea and vomiting)    Tobacco dependence    Vitamin D deficiency     Past Surgical History:  Procedure Laterality Date   ABDOMINAL AORTOGRAM W/LOWER EXTREMITY N/A 09/18/2021   Procedure: ABDOMINAL AORTOGRAM W/LOWER EXTREMITY;  Surgeon: [name], MD;  Location: [facility];  Service: Cardiovascular;  Laterality: N/A;   APPENDECTOMY     BREAST BIOPSY     CATARACT EXTRACTION, BILATERAL     COLON SURGERY     2009   COSMETIC SURGERY     on neck and eyes   DILATION AND CURETTAGE OF UTERUS     EYE SURGERY     2014   FEMUR SURGERY  12/2007   left femur surgery--for femoral neck fracture status post fall    FRACTURE SURGERY     broke left leg and left arm- 2012   LIPOSUCTION     LUMBAR LAMINECTOMY/DECOMPRESSION MICRODISCECTOMY N/A 11/12/2020   Procedure: LAMINECTOMY AND FORAMINOTOMY Lumbar three four, Lumbar four five, Lumbar Five Sacral one;  Surgeon: Newman Pies, MD;  Location: [facility];  Service: Neurosurgery;  Laterality: N/A;   OTHER SURGICAL HISTORY     sigmoid colectomy  SHOULDER SURGERY     arthroscopic shoulder surgery    TONSILLECTOMY     TUBAL LIGATION       Current Outpatient Medications  Medication Sig Dispense Refill   acetaminophen (TYLENOL) 500 MG tablet Take 500-1,000 mg by mouth every 6 (six) hours as needed for headache (pain).     amLODipine (NORVASC) 5 MG tablet Take 5 mg by mouth at bedtime.     aspirin EC 81 MG tablet Take 81 mg by mouth at bedtime. Swallow whole.     cholecalciferol (VITAMIN D3) 25 MCG (1000 UNIT) tablet Take 1,000 Units by mouth daily.     cloNIDine (CATAPRES) 0.1 MG tablet Take 0.2 mg by mouth at bedtime.     Coenzyme Q10 100 MG capsule Take 100 mg by mouth every  morning.     famotidine (PEPCID) 20 MG tablet Take 20-40 mg by mouth at bedtime.     furosemide (LASIX) 20 MG tablet Take 20 mg by mouth daily as needed for edema.     gabapentin (NEURONTIN) 100 MG capsule Take 2 capsules (200 mg total) by mouth at bedtime. PATIENT NEEDS OFFICE VISIT FOR ADDITIONAL REFILLS (Patient taking differently: Take 100-200 mg by mouth See admin instructions. Take one capsule (100 mg) by mouth every morning and two capsules (200 mg) at night) 60 capsule 0   INCRUSE ELLIPTA 62.5 MCG/INH AEPB Inhale 1 puff into the lungs daily. 30 each 1   irbesartan-hydrochlorothiazide (AVALIDE) 150-12.5 MG per tablet Take 1 tablet by mouth every morning.     meloxicam (MOBIC) 15 MG tablet Take 15 mg by mouth See admin instructions. Take one tablet (15 mg) by mouth once every few days as needed for arthritis pain     metoprolol succinate (TOPROL-XL) 25 MG 24 hr tablet Take 25 mg by mouth at bedtime.     Multiple Vitamin (MULTIVITAMIN WITH MINERALS) TABS tablet Take 1 tablet by mouth 2 (two) times daily.     ondansetron (ZOFRAN ODT) 4 MG disintegrating tablet Take 1 tablet (4 mg total) by mouth every 8 (eight) hours as needed for nausea or vomiting. 20 tablet 0   simvastatin (ZOCOR) 40 MG tablet Take 40 mg by mouth at bedtime.     TURMERIC CURCUMIN PO Take 1 capsule by mouth daily. With Ginger     vitamin B-12 (CYANOCOBALAMIN) 100 MCG tablet Take 100 mcg by mouth every morning.     vitamin E 180 MG (400 UNITS) capsule Take 400 Units by mouth daily.     No current facility-administered medications for this visit.    Allergies:   Other, Pollen extract, Tegretol [carbamazepine], Lisinopril, and Tetracyclines & related    Social History:  The patient  reports that she has been smoking cigarettes. She has a 30.00 pack-year smoking history. She has never used smokeless tobacco. She reports current alcohol use. She reports that she does not use drugs.   Family History:  The patient's family  history includes Heart disease in her mother; Stroke in her mother.    ROS:  Please see the history of present illness.   Otherwise, review of systems are positive for none.   All other systems are reviewed and negative.    PHYSICAL EXAM: VS:  BP (!) 134/54   Pulse 68   Ht 5\' 2"  (1.575 m)   Wt 110 lb 3.2 oz (50 kg)   SpO2 97%   BMI 20.16 kg/m  , BMI Body mass index is 20.16 kg/m. GEN: Well nourished,  well developed, in no acute distress  HEENT: normal  Neck: no JVD, carotid bruits, or masses Cardiac: RRR; no  rubs, or gallops,no edema .  3 out of 6 crescendo decrescendo systolic murmur in the aortic area which is mid peaking with diminished S2.  The murmur radiates to both carotid arteries. Respiratory:  clear to auscultation bilaterally, normal work of breathing GI: soft, nontender, nondistended, + BS MS: no deformity or atrophy  Skin: warm and dry, no rash Neuro:  Strength and sensation are intact Psych: euthymic mood, full affect   EKG:  EKG is ordered today. The ekg ordered today demonstrates normal sinus rhythm with no significant ST or T wave changes no evidence of prior infarct.  Possible left atrial enlargement.   Recent Labs: 11/20/2020: B Natriuretic Peptide 452.8 04/05/2021: ALT 11 04/07/2021: Magnesium 1.9; Platelets 159 09/18/2021: BUN 17; Creatinine, Ser 1.10; Hemoglobin 12.2; Potassium 4.0; Sodium 142    Lipid Panel    Component Value Date/Time   TRIG 104 11/17/2020 0525      Wt Readings from Last 3 Encounters:  09/24/21 110 lb 3.2 oz (50 kg)  09/18/21 108 lb (49 kg)  09/09/21 108 lb (49 kg)        No flowsheet data found.    ASSESSMENT AND PLAN:  1.  Preop cardiovascular evaluation for aortobifemoral bypass: This is a major vascular surgery.  The patient's symptoms include exertional dyspnea without chest pain.  EKG with no ischemic changes.  Functional capacity is reduced due to limitations related to claudication and rest pain.  I recommend  evaluation with a Lexiscan Myoview for risk stratification.  In addition, we will have to make sure that her aortic stenosis is not severe.  I requested an echocardiogram.  2.  Aortic stenosis: Was mild on most recent evaluation few years ago.  I requested a follow-up echocardiogram.  3.  Essential hypertension: Blood pressure is controlled on current medications.  4.  Hyperlipidemia: Currently on simvastatin.  Recommended target LDL of less than 70 given extensive peripheral arterial disease.  5.  Tobacco use: The patient knows the importance of smoking cessation but has difficulty quitting.    Disposition:   Proceed with stress testing and echocardiogram before surgery and follow-up with me if testing is abnormal.  Signed,  Kathlyn Sacramento, MD  09/25/2021 5:59 PM    [place]

## 2021-09-24 NOTE — Patient Instructions (Signed)
Medication Instructions:  Your physician recommends that you continue on your current medications as directed. Please refer to the Current Medication list given to you today.  *If you need a refill on your cardiac medications before your next appointment, please call your pharmacy*   Testing/Procedures: Your physician has requested that you have an echocardiogram. Echocardiography is a painless test that uses sound waves to create images of your heart. It provides your doctor with information about the size and shape of your heart and how well your heart's chambers and valves are working. This procedure takes approximately one hour. There are no restrictions for this procedure.   Your physician has requested that you have a lexiscan myoview. For further information please visit [email or web address]. Please follow instruction sheet, as given. This will take place at [place], [address]  How to prepare for your Myocardial Perfusion Test: Do not eat or drink 3 hours prior to your test, except you may have water. Do not consume products containing caffeine (regular or decaffeinated) 12 hours prior to your test. (ex: coffee, chocolate, sodas, tea). Do bring a list of your current medications with you.  If not listed below, you may take your medications as normal. Do wear comfortable clothes (no dresses or overalls) and walking shoes, tennis shoes preferred (No heels or open toe shoes are allowed). Do NOT wear cologne, perfume, aftershave, or lotions (deodorant is allowed). The test will take approximately 3 to 4 hours to complete If these instructions are not followed, your test will have to be rescheduled.    Follow-Up: At [facility], you and your health needs are our priority.  As part of our continuing mission to provide you with exceptional heart care, we have created designated Provider Care Teams.  These Care Teams include your primary Cardiologist (physician) and Advanced  Practice Providers (APPs -  Physician Assistants and Nurse Practitioners) who all work together to provide you with the care you need, when you need it.  We recommend signing up for the patient portal called "MyChart".  Sign up information is provided on this After Visit Summary.  MyChart is used to connect with patients for Virtual Visits (Telemedicine).  Patients are able to view lab/test results, encounter notes, upcoming appointments, etc.  Non-urgent messages can be sent to your provider as well.   To learn more about what you can do with MyChart, go to [email or web address].    Your next appointment:   We will see you on an as needed basis.  Provider:   Kathlyn Sacramento, MD

## 2021-09-27 ENCOUNTER — Ambulatory Visit (HOSPITAL_COMMUNITY)
Admission: RE | Admit: 2021-09-27 | Discharge: 2021-09-27 | Disposition: A | Discharge status: Home / Self Care | Payer: Medicare Other | Source: Ambulatory Visit | Attending: Vascular Surgery | Admitting: Vascular Surgery

## 2021-09-27 DIAGNOSIS — K5730 Diverticulosis of large intestine without perforation or abscess without bleeding: Secondary | ICD-10-CM | POA: Diagnosis not present

## 2021-09-27 DIAGNOSIS — N281 Cyst of kidney, acquired: Secondary | ICD-10-CM | POA: Diagnosis not present

## 2021-09-27 DIAGNOSIS — I70222 Atherosclerosis of native arteries of extremities with rest pain, left leg: Secondary | ICD-10-CM | POA: Insufficient documentation

## 2021-09-27 MED ORDER — IOHEXOL 350 MG/ML SOLN
100.0000 mL | Freq: Once | INTRAVENOUS | Status: AC | PRN
  Administered 2021-09-27: 100 mL via INTRAVENOUS

## 2021-09-27 MED ORDER — SODIUM CHLORIDE (PF) 0.9 % IJ SOLN
INTRAMUSCULAR | Status: AC
  Filled 2021-09-27: qty 50

## 2021-10-01 ENCOUNTER — Other Ambulatory Visit (HOSPITAL_COMMUNITY): Payer: Self-pay | Admitting: *Deleted

## 2021-10-02 ENCOUNTER — Other Ambulatory Visit: Payer: Self-pay

## 2021-10-02 ENCOUNTER — Telehealth (HOSPITAL_COMMUNITY): Payer: Self-pay | Admitting: *Deleted

## 2021-10-02 ENCOUNTER — Ambulatory Visit (HOSPITAL_COMMUNITY)
Admission: RE | Admit: 2021-10-02 | Discharge: 2021-10-02 | Disposition: A | Discharge status: Home / Self Care | Payer: Medicare Other | Source: Ambulatory Visit | Attending: Internal Medicine | Admitting: Internal Medicine

## 2021-10-02 DIAGNOSIS — M810 Age-related osteoporosis without current pathological fracture: Secondary | ICD-10-CM | POA: Insufficient documentation

## 2021-10-02 MED ORDER — ZOLEDRONIC ACID 5 MG/100ML IV SOLN
INTRAVENOUS | Status: AC
  Administered 2021-10-02: 5 mg via INTRAVENOUS
  Filled 2021-10-02: qty 100

## 2021-10-02 MED ORDER — ZOLEDRONIC ACID 5 MG/100ML IV SOLN: 5.0000 mg | Freq: Once | INTRAVENOUS | Status: AC

## 2021-10-02 NOTE — Telephone Encounter (Signed)
Close encounter 

## 2021-10-03 ENCOUNTER — Ambulatory Visit (INDEPENDENT_AMBULATORY_CARE_PROVIDER_SITE_OTHER): Payer: Medicare Other

## 2021-10-03 DIAGNOSIS — I10 Essential (primary) hypertension: Secondary | ICD-10-CM

## 2021-10-03 DIAGNOSIS — I350 Nonrheumatic aortic (valve) stenosis: Secondary | ICD-10-CM

## 2021-10-03 DIAGNOSIS — R0609 Other forms of dyspnea: Secondary | ICD-10-CM | POA: Diagnosis not present

## 2021-10-03 LAB — ECHOCARDIOGRAM COMPLETE
AR max vel: 0.94 cm2
AV Area VTI: 0.91 cm2
AV Area mean vel: 0.91 cm2
AV Mean grad: 12.5 mmHg
AV Peak grad: 25.2 mmHg
Ao pk vel: 2.51 m/s
Area-P 1/2: 2.22 cm2
MV VTI: 1.06 cm2
P 1/2 time: 331 msec
S' Lateral: 1.96 cm

## 2021-10-04 ENCOUNTER — Other Ambulatory Visit: Payer: Self-pay

## 2021-10-04 ENCOUNTER — Ambulatory Visit (HOSPITAL_COMMUNITY)
Admission: RE | Admit: 2021-10-04 | Discharge: 2021-10-04 | Disposition: A | Discharge status: Home / Self Care | Payer: Medicare Other | Source: Ambulatory Visit | Attending: Cardiovascular Disease | Admitting: Cardiovascular Disease

## 2021-10-04 ENCOUNTER — Encounter: Payer: Self-pay | Admitting: Vascular Surgery

## 2021-10-04 DIAGNOSIS — R0609 Other forms of dyspnea: Secondary | ICD-10-CM | POA: Diagnosis not present

## 2021-10-04 LAB — MYOCARDIAL PERFUSION IMAGING
Base ST Depression (mm): 0 mm
LV dias vol: 65 mL (ref 46–106)
LV sys vol: 20 mL
Nuc Stress EF: 69 %
Peak HR: 72 {beats}/min
Rest HR: 55 {beats}/min
Rest Nuclear Isotope Dose: 9.3 mCi
SDS: 4
SRS: 0
SSS: 4
ST Depression (mm): 0 mm
Stress Nuclear Isotope Dose: 29.3 mCi
TID: 0.95

## 2021-10-04 MED ORDER — TECHNETIUM TC 99M TETROFOSMIN IV KIT
29.3000 | PACK | Freq: Once | INTRAVENOUS | Status: AC | PRN
  Administered 2021-10-04: 29.3 via INTRAVENOUS
  Filled 2021-10-04: qty 30

## 2021-10-04 MED ORDER — TECHNETIUM TC 99M TETROFOSMIN IV KIT
9.3000 | PACK | Freq: Once | INTRAVENOUS | Status: AC | PRN
  Administered 2021-10-04: 9.3 via INTRAVENOUS
  Filled 2021-10-04: qty 10

## 2021-10-04 MED ORDER — REGADENOSON 0.4 MG/5ML IV SOLN
0.4000 mg | Freq: Once | INTRAVENOUS | Status: AC
  Administered 2021-10-04: 0.4 mg via INTRAVENOUS

## 2021-10-07 ENCOUNTER — Other Ambulatory Visit: Payer: Self-pay | Admitting: *Deleted

## 2021-10-07 ENCOUNTER — Telehealth: Payer: Self-pay | Admitting: Cardiovascular Disease

## 2021-10-07 DIAGNOSIS — I350 Nonrheumatic aortic (valve) stenosis: Secondary | ICD-10-CM

## 2021-10-07 NOTE — Telephone Encounter (Signed)
Pt returning call for recent results

## 2021-10-07 NOTE — Telephone Encounter (Signed)
Called patient, gave results.  She is aware of ECHO, STRESS TEST.   Thanks!   Will route to RN as Juluis Rainier.

## 2021-10-09 DIAGNOSIS — Z23 Encounter for immunization: Secondary | ICD-10-CM | POA: Diagnosis not present

## 2021-10-11 ENCOUNTER — Ambulatory Visit (INDEPENDENT_AMBULATORY_CARE_PROVIDER_SITE_OTHER): Payer: Medicare Other | Admitting: Vascular Surgery

## 2021-10-11 ENCOUNTER — Other Ambulatory Visit: Payer: Self-pay

## 2021-10-11 ENCOUNTER — Encounter: Payer: Self-pay | Admitting: Vascular Surgery

## 2021-10-11 VITALS — BP 108/61 | HR 68 | Temp 97.7°F | Resp 20 | Ht 62.0 in | Wt 110.0 lb

## 2021-10-11 DIAGNOSIS — Z419 Encounter for procedure for purposes other than remedying health state, unspecified: Secondary | ICD-10-CM

## 2021-10-11 DIAGNOSIS — I70222 Atherosclerosis of native arteries of extremities with rest pain, left leg: Secondary | ICD-10-CM | POA: Diagnosis not present

## 2021-10-11 NOTE — Progress Notes (Signed)
Office Note     HPI: Elizabeth [name] is a 80 y.o. (12/23/1940) female presenting for follow-up status post angiogram for bilateral lower extremity rest pain, which demonstrated severe aortoiliac disease.  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  She describes her leg pain as left greater than right, occurring at rest, waking her up at night.  She feels as though her legs will give out due to pain should she walk greater than 20 feet. Patient is a non-smoker with significant family history of vascular disease.  Her mother's first vascular surgery occurred at the age of 30.  Multiple family members have died from stroke at an early age.  Since seen last, she has had no improvement in her symptoms.  She underwent cardiac work-up demonstrating a normal stress test.  This was done in anticipation for aortobifemoral bypass discussion, which was started post angiogram.  I asked her to come to the office today to further our discussion including risk benefits and alternatives.   The pt is  on a statin for cholesterol management.  The pt is  on a daily aspirin.   Other AC:  - The pt is  on medication for hypertension.   The pt is not diabetic.  Tobacco hx:  Daily  Past Medical History:  Diagnosis Date   Abdominal cramping    possibly irritable bowel syndrome versus adhesions secondary to multiple surgeries and abdominal inflammation and 2009 secondary to diverticulitis and diverticular phlegmon   Allergic rhinitis    Arthritis    Carotid artery disease (HCC)    19-50% RICA, < 93% LICA 26/7/12 [name] ([facility])   Cataract    Chest pain syndrome 2004   with adenosine Cardiolite that was normal   Chronic hoarseness    COPD (chronic obstructive pulmonary disease) (HCC)    GERD (gastroesophageal reflux disease)    Heart murmur    Mild AS, AI. AV pk grad 18.79 mmHg, mn grad 11.20 mmHg, AVA (VTI) 1.05 cm2 10/10/20, 3  year f/u rec ([facility])   History of kidney stones    History of renal calculi    Hypercholesterolemia    Hypertension    Incontinence    Cough incontinence   Mild depression    Neuromuscular disorder (HCC)    neuropathy bilateral lower extremities   Osteopenia    Peripheral vascular disease ([place])    with femoral and carotid bruits   PONV (postoperative nausea and vomiting)    Tobacco dependence    Vitamin D deficiency     Past Surgical History:  Procedure Laterality Date   ABDOMINAL AORTOGRAM W/LOWER EXTREMITY N/A 09/18/2021   Procedure: ABDOMINAL AORTOGRAM W/LOWER EXTREMITY;  Surgeon: [name], MD;  Location: [facility];  Service: Cardiovascular;  Laterality: N/A;   APPENDECTOMY     BREAST BIOPSY     CATARACT EXTRACTION, BILATERAL     COLON SURGERY     2009   COSMETIC SURGERY     on neck and eyes   DILATION AND CURETTAGE OF UTERUS     EYE SURGERY     2014   FEMUR SURGERY  12/2007   left femur surgery--for femoral neck fracture status post fall    FRACTURE SURGERY     broke left leg and left arm- 2012   LIPOSUCTION     LUMBAR LAMINECTOMY/DECOMPRESSION MICRODISCECTOMY N/A 11/12/2020   Procedure: LAMINECTOMY AND  FORAMINOTOMY Lumbar three four, Lumbar four five, Lumbar Five Sacral one;  Surgeon: Newman Pies, MD;  Location: [facility];  Service: Neurosurgery;  Laterality: N/A;   OTHER SURGICAL HISTORY     sigmoid colectomy   SHOULDER SURGERY     arthroscopic shoulder surgery    TONSILLECTOMY     TUBAL LIGATION      Social History   Socioeconomic History   Marital status: Widowed    Spouse name: Not on file   Number of children: Not on file   Years of education: Not on file   Highest education level: Not on file  Occupational History   Not on file  Tobacco Use   Smoking status: Every Day    Packs/day: 0.50    Years: 60.00    Pack years: 30.00    Types: Cigarettes   Smokeless tobacco: Never  Vaping Use   Vaping Use: Never used  Substance and  Sexual Activity   Alcohol use: Yes    Alcohol/week: 0.0 standard drinks    Comment: Occ glass of wine   Drug use: No   Sexual activity: Not on file  Other Topics Concern   Not on file  Social History Narrative   Not on file   Social Determinants of Health   Financial Resource Strain: Not on file  Food Insecurity: Not on file  Transportation Needs: Not on file  Physical Activity: Not on file  Stress: Not on file  Social Connections: Not on file  Intimate Partner Violence: Not on file    Family History  Problem Relation Age of Onset   Heart disease Mother    Stroke Mother     Current Outpatient Medications  Medication Sig Dispense Refill   acetaminophen (TYLENOL) 500 MG tablet Take 500-1,000 mg by mouth every 6 (six) hours as needed for headache (pain).     amLODipine (NORVASC) 5 MG tablet Take 5 mg by mouth at bedtime.     aspirin EC 81 MG tablet Take 81 mg by mouth at bedtime. Swallow whole.     cholecalciferol (VITAMIN D3) 25 MCG (1000 UNIT) tablet Take 1,000 Units by mouth daily.     cloNIDine (CATAPRES) 0.1 MG tablet Take 0.2 mg by mouth at bedtime.     Coenzyme Q10 100 MG capsule Take 100 mg by mouth every morning.     famotidine (PEPCID) 20 MG tablet Take 20-40 mg by mouth at bedtime.     furosemide (LASIX) 20 MG tablet Take 20 mg by mouth daily as needed for edema.     gabapentin (NEURONTIN) 100 MG capsule Take 2 capsules (200 mg total) by mouth at bedtime. PATIENT NEEDS OFFICE VISIT FOR ADDITIONAL REFILLS (Patient taking differently: Take 100-200 mg by mouth See admin instructions. Take one capsule (100 mg) by mouth every morning and two capsules (200 mg) at night) 60 capsule 0   INCRUSE ELLIPTA 62.5 MCG/INH AEPB Inhale 1 puff into the lungs daily. 30 each 1   irbesartan-hydrochlorothiazide (AVALIDE) 150-12.5 MG per tablet Take 1 tablet by mouth every morning.     meloxicam (MOBIC) 15 MG tablet Take 15 mg by mouth See admin instructions. Take one tablet (15 mg) by  mouth once every few days as needed for arthritis pain     metoprolol succinate (TOPROL-XL) 25 MG 24 hr tablet Take 25 mg by mouth at bedtime.     Multiple Vitamin (MULTIVITAMIN WITH MINERALS) TABS tablet Take 1 tablet by mouth 2 (two) times daily.  ondansetron (ZOFRAN ODT) 4 MG disintegrating tablet Take 1 tablet (4 mg total) by mouth every 8 (eight) hours as needed for nausea or vomiting. 20 tablet 0   simvastatin (ZOCOR) 40 MG tablet Take 40 mg by mouth at bedtime.     TURMERIC CURCUMIN PO Take 1 capsule by mouth daily. With Ginger     vitamin B-12 (CYANOCOBALAMIN) 100 MCG tablet Take 100 mcg by mouth every morning.     vitamin E 180 MG (400 UNITS) capsule Take 400 Units by mouth daily.     No current facility-administered medications for this visit.    Allergies  Allergen Reactions   Other Other (See Comments)    Chlorexolone - unknown reaction   Pollen Extract     Allergic to tree pollens   Tegretol [Carbamazepine] Other (See Comments)    Unknown reaction   Lisinopril Rash   Tetracyclines & Related Rash     REVIEW OF SYSTEMS:   [X]  denotes positive finding, [ ]  denotes negative finding Cardiac  Comments:  Chest pain or chest pressure:    Shortness of breath upon exertion:    Short of breath when lying flat:    Irregular heart rhythm:        Vascular    Pain in calf, thigh, or hip brought on by ambulation:    Pain in feet at night that wakes you up from your sleep:     Blood clot in your veins:    Leg swelling:         Pulmonary    Oxygen at home:    Productive cough:     Wheezing:         Neurologic    Sudden weakness in arms or legs:     Sudden numbness in arms or legs:     Sudden onset of difficulty speaking or slurred speech:    Temporary loss of vision in one eye:     Problems with dizziness:         Gastrointestinal    Blood in stool:     Vomited blood:         Genitourinary    Burning when urinating:     Blood in urine:        Psychiatric     Major depression:         Hematologic    Bleeding problems:    Problems with blood clotting too easily:        Skin    Rashes or ulcers:        Constitutional    Fever or chills:      PHYSICAL EXAMINATION:  Vitals:   10/11/21 1118  BP: 108/61  Pulse: 68  Resp: 20  Temp: 97.7 F (36.5 C)  SpO2: 96%  Weight: 110 lb (49.9 kg)  Height: 5\' 2"  (1.575 m)    General:  WDWN in NAD; vital signs documented above Gait: Not observed HENT: WNL, normocephalic Pulmonary: normal non-labored breathing , without Rales, rhonchi,  wheezing Cardiac: regular HR,  Abdomen: soft, NT, no masses Skin: without rashes Vascular Exam/Pulses:  Right Left  Radial 2+ (normal) 2+ (normal)  Ulnar 2+ (normal) 2+ (normal)  Femoral 2+ (normal) 1+ (weak)  Popliteal    DP 1+ (weak) absent  PT absent absent   Extremities: without ischemic changes, without Gangrene , without cellulitis; without open wounds;  Musculoskeletal: no muscle wasting or atrophy  Neurologic: A&O X 3;  No focal weakness or paresthesias are detected Psychiatric:  The pt has Normal affect.   Non-Invasive Vascular Imaging: Significant progression since last ABI and 2017  +-------+-----------+-----------+------------+------------+  ABI/TBIToday's ABIToday's TBIPrevious ABIPrevious TBI  +-------+-----------+-----------+------------+------------+  Right  0.60       0.34                                 +-------+-----------+-----------+------------+------------+  Left   0.32       0.05                                 +-------+-----------+-----------+------------+------------+     ASSESSMENT/PLAN: JAMIEKA ROYLE is a 80 y.o. female presenting with rest pain in the left leg.  ABIs were independently reviewed demonstrating severe peripheral arterial disease on the left with nearly-absent toe pressure. 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  Recent aortogram  demonstrated severe aortoiliac disease.  Due to the size of her arteries, she would be best treated with aortobifemoral bypass grafting.  While stents could be placed, these would likely thrombose over time as they would be small.  We discussed the role of aortobifemoral bypass surgery.  Complicating this is the patient's previous abdominal surgery history including colon surgery in 2009.  She is unsure as to what was removed, but has a infraumbilical midline incision.  She also has a history of back surgery.  He is aware that while aortobifemoral bypass surgery will improve distal perfusion and will help with ischemic rest pain, it may not cure all of her pain as there is likely a musculoskeletal component as well.  We discussed the risk and benefits of surgery including injury to adjacent structures, bleeding, infection.  After discussing these aspects, Camron elected to proceed.  Emiko asked to wait till after the holidays.  I will schedule her at her convenience. He was asked to continue current medication regimen including aspirin, statin daily.     [name], MD [facility] [phone]

## 2021-11-26 NOTE — Pre-Procedure Instructions (Signed)
Surgical Instructions    Your procedure is scheduled on Tuesday, January 31st.  Report to [facility] Main Entrance "A" at 5:30 A.M., then check in with the Admitting office.  Call this number if you have problems the morning of surgery:  [phone]   If you have any questions prior to your surgery date call [phone]: Open Monday-Friday 8am-4pm    Remember:  Do not eat or drink after midnight the night before your surgery    Take these medicines the morning of surgery with A SIP OF WATER  aspirin EC gabapentin (NEURONTIN)  INCRUSE ELLIPTA famotidine (PEPCID)-as needed  As of today, STOP taking any Aspirin (unless otherwise instructed by your surgeon) Aleve, Naproxen, Ibuprofen, Motrin, Advil, Goody's, BC's, all herbal medications, fish oil, and all vitamins.  After your COVID test   You are not required to quarantine however you are required to wear a well-fitting mask when you are out and around people not in your household.  If your mask becomes wet or soiled, replace with a new one.  Wash your hands often with soap and water for 20 seconds or clean your hands with an alcohol-based hand sanitizer that contains at least 60% alcohol.  Do not share personal items.  Notify your provider: if you are in close contact with someone who has COVID  or if you develop a fever of 100.4 or greater, sneezing, cough, sore throat, shortness of breath or body aches.           Do not wear jewelry or makeup Do not wear lotions, powders, perfumes, or deodorant. Do not shave 48 hours prior to surgery.   Do not bring valuables to the hospital. DO Not wear nail polish, gel polish, artificial nails, or any other type of covering on natural nails (fingers and toes) If you have artificial nails or gel coating that need to be removed by a nail salon, please have this removed prior to surgery. Artificial nails or gel coating may interfere with anesthesia's ability to adequately monitor your vital  signs.             [facility] is not responsible for any belongings or valuables.  Do NOT Smoke (Tobacco/Vaping)  24 hours prior to your procedure  If you use a CPAP at night, you may bring your mask for your overnight stay.   Contacts, glasses, hearing aids, dentures or partials may not be worn into surgery, please bring cases for these belongings   For patients admitted to the hospital, discharge time will be determined by your treatment team.   Patients discharged the day of surgery will not be allowed to drive home, and someone needs to stay with them for 24 hours.  NO VISITORS WILL BE ALLOWED IN PRE-OP WHERE PATIENTS ARE PREPPED FOR SURGERY.  ONLY 1 SUPPORT PERSON MAY BE PRESENT IN THE WAITING ROOM WHILE YOU ARE IN SURGERY.  IF YOU ARE TO BE ADMITTED, ONCE YOU ARE IN YOUR ROOM YOU WILL BE ALLOWED TWO (2) VISITORS. 1 (ONE) VISITOR MAY STAY OVERNIGHT BUT MUST ARRIVE TO THE ROOM BY 8pm.  Minor children may have two parents present. Special consideration for safety and communication needs will be reviewed on a case by case basis.  Special instructions:    Oral Hygiene is also important to reduce your risk of infection.  Remember - BRUSH YOUR TEETH THE MORNING OF SURGERY WITH YOUR REGULAR TOOTHPASTE   [facility]- Preparing For Surgery  Before surgery, you can play an  important role. Because skin is not sterile, your skin needs to be as free of germs as possible. You can reduce the number of germs on your skin by washing with CHG (chlorahexidine gluconate) Soap before surgery.  CHG is an antiseptic cleaner which kills germs and bonds with the skin to continue killing germs even after washing.     Please do not use if you have an allergy to CHG or antibacterial soaps. If your skin becomes reddened/irritated stop using the CHG.  Do not shave (including legs and underarms) for at least 48 hours prior to first CHG shower. It is OK to shave your face.  Please follow these instructions  carefully.     Shower the NIGHT BEFORE SURGERY and the MORNING OF SURGERY with CHG Soap.   If you chose to wash your hair, wash your hair first as usual with your normal shampoo. After you shampoo, rinse your hair and body thoroughly to remove the shampoo.  Then [employer] and genitals (private parts) with your normal soap and rinse thoroughly to remove soap.  After that Use CHG Soap as you would any other liquid soap. You can apply CHG directly to the skin and wash gently with a scrungie or a clean washcloth.   Apply the CHG Soap to your body ONLY FROM THE NECK DOWN.  Do not use on open wounds or open sores. Avoid contact with your eyes, ears, mouth and genitals (private parts). Wash Face and genitals (private parts)  with your normal soap.   Wash thoroughly, paying special attention to the area where your surgery will be performed.  Thoroughly rinse your body with warm water from the neck down.  DO NOT shower/wash with your normal soap after using and rinsing off the CHG Soap.  Pat yourself dry with a CLEAN TOWEL.  Wear CLEAN PAJAMAS to bed the night before surgery  Place CLEAN SHEETS on your bed the night before your surgery  DO NOT SLEEP WITH PETS.   Day of Surgery:  Take a shower with CHG soap. Wear Clean/Comfortable clothing the morning of surgery Do not apply any deodorants/lotions.   Remember to brush your teeth WITH YOUR REGULAR TOOTHPASTE.   Please read over the following fact sheets that you were given.

## 2021-11-27 ENCOUNTER — Encounter (HOSPITAL_COMMUNITY)
Admission: RE | Admit: 2021-11-27 | Discharge: 2021-11-27 | Disposition: A | Discharge status: Home / Self Care | Payer: Medicare Other | Source: Ambulatory Visit | Attending: Vascular Surgery | Admitting: Vascular Surgery

## 2021-11-27 ENCOUNTER — Encounter (HOSPITAL_COMMUNITY): Payer: Self-pay

## 2021-11-27 ENCOUNTER — Other Ambulatory Visit: Payer: Self-pay

## 2021-11-27 VITALS — BP 142/47 | HR 93 | Temp 97.9°F | Resp 18 | Ht 60.0 in | Wt 111.1 lb

## 2021-11-27 DIAGNOSIS — I70222 Atherosclerosis of native arteries of extremities with rest pain, left leg: Secondary | ICD-10-CM | POA: Diagnosis not present

## 2021-11-27 DIAGNOSIS — Z419 Encounter for procedure for purposes other than remedying health state, unspecified: Secondary | ICD-10-CM | POA: Insufficient documentation

## 2021-11-27 DIAGNOSIS — Z01812 Encounter for preprocedural laboratory examination: Secondary | ICD-10-CM | POA: Diagnosis not present

## 2021-11-27 DIAGNOSIS — Z01818 Encounter for other preprocedural examination: Secondary | ICD-10-CM

## 2021-11-27 HISTORY — DX: Dyspnea, unspecified: R06.00

## 2021-11-27 LAB — URINALYSIS, ROUTINE W REFLEX MICROSCOPIC
Bacteria, UA: NONE SEEN
Bilirubin Urine: NEGATIVE
Glucose, UA: NEGATIVE mg/dL
Hgb urine dipstick: NEGATIVE
Ketones, ur: 5 mg/dL — AB
Nitrite: NEGATIVE
Protein, ur: NEGATIVE mg/dL
Specific Gravity, Urine: 1.02 (ref 1.005–1.030)
pH: 5 (ref 5.0–8.0)

## 2021-11-27 LAB — COMPREHENSIVE METABOLIC PANEL
ALT: 15 U/L (ref 0–44)
AST: 25 U/L (ref 15–41)
Albumin: 3.7 g/dL (ref 3.5–5.0)
Alkaline Phosphatase: 65 U/L (ref 38–126)
Anion gap: 10 (ref 5–15)
BUN: 15 mg/dL (ref 8–23)
CO2: 24 mmol/L (ref 22–32)
Calcium: 9.4 mg/dL (ref 8.9–10.3)
Chloride: 101 mmol/L (ref 98–111)
Creatinine, Ser: 1.19 mg/dL — ABNORMAL HIGH (ref 0.44–1.00)
GFR, Estimated: 46 mL/min — ABNORMAL LOW (ref >60)
Glucose, Bld: 97 mg/dL (ref 70–99)
Potassium: 3.4 mmol/L — ABNORMAL LOW (ref 3.5–5.1)
Sodium: 135 mmol/L (ref 135–145)
Total Bilirubin: 0.6 mg/dL (ref 0.3–1.2)
Total Protein: 7.1 g/dL (ref 6.5–8.1)

## 2021-11-27 LAB — PROTIME-INR
INR: 1 (ref 0.8–1.2)
Prothrombin Time: 12.6 seconds (ref 11.4–15.2)

## 2021-11-27 LAB — CBC
HCT: 40.5 % (ref 36.0–46.0)
Hemoglobin: 13.1 g/dL (ref 12.0–15.0)
MCH: 31.2 pg (ref 26.0–34.0)
MCHC: 32.3 g/dL (ref 30.0–36.0)
MCV: 96.4 fL (ref 80.0–100.0)
Platelets: 211 10*3/uL (ref 150–400)
RBC: 4.2 MIL/uL (ref 3.87–5.11)
RDW: 12.9 % (ref 11.5–15.5)
WBC: 8.6 10*3/uL (ref 4.0–10.5)
nRBC: 0 % (ref 0.0–0.2)

## 2021-11-27 LAB — SURGICAL PCR SCREEN
MRSA, PCR: NEGATIVE
Staphylococcus aureus: NEGATIVE

## 2021-11-27 LAB — APTT: aPTT: 30 seconds (ref 24–36)

## 2021-11-27 NOTE — Progress Notes (Addendum)
PCP - [name] Cardiologist - Dr. Fletcher Anon Last office visit 09/24/21  Chest x-ray - 04/04/21 (1 view) EKG - 09/24/21 Stress Test - 10/04/21 ECHO - 10/07/21  Aspirin Instructions: follow your surgeon's instructions on when to stop ASA   COVID TEST- Friday 11/29/21 @ [facility] (surgery admit)   Anesthesia review: yes, heart history, abnormal UA  Patient denies shortness of breath, fever, cough and chest pain at PAT appointment   All instructions explained to the patient, with a verbal understanding of the material. Patient agrees to go over the instructions while at home for a better understanding. Patient also instructed to self quarantine after being tested for COVID-19. The opportunity to ask questions was provided.

## 2021-11-28 NOTE — Progress Notes (Signed)
Anesthesia Chart Review:  Patient was referred to cardiology for preop evaluation.  Seen by Dr. Fletcher Anon 09/24/2021.  Nuclear stress and echocardiogram ordered.  Stress test was low risk, nonischemic.  Echo showed normal LVEF with mild mitral stenosis and moderate aortic stenosis.  Dr. Fletcher Anon commented on the results 10/07/2021 stating, "echo showed normal ejection fraction with mild mitral stenosis and moderate aortic stenosis. Her stress test was also normal.  Based on this, no contraindication for aortobifemoral bypass from a cardiac standpoint. Recommend a follow-up echocardiogram in 1 year to monitor aortic stenosis."  Preop labs reviewed, unremarkable.  EKG 09/24/2021: NSR.  Rate 68.  Possible LAE.  Nuclear stress 10/04/2021:   The patient reported no symptoms during the stress test.   ECG is normal. ECG rhythm shows normal sinus rhythm. Resting ECG shows no ST-segment deviation.   No ST deviation was noted. There were no arrhythmias during stress. There were no arrhythmias during recovery. ECG was interpretable and without significant changes. The ECG was not diagnostic due to pharmacologic protocol.   Overall image quality is good. Image quality affected due to significant extracardiac activity.   LV perfusion is normal. There is no evidence of ischemia. There is no evidence of infarction.   Nuclear stress EF: 69 %. The left ventricular ejection fraction is hyperdynamic (>65%). No evidence of transient ischemic dilation (TID) noted.   The study is normal. The study is low risk.   Prior study available for comparison from 11/22/2013.  TTE 10/03/2021:  1. Left ventricular ejection fraction, by estimation, is 70 to 75%. The  left ventricle has hyperdynamic function. The left ventricle has no  regional wall motion abnormalities. Left ventricular diastolic parameters  are consistent with Grade I diastolic  dysfunction (impaired relaxation). The average left ventricular global  longitudinal strain  is -15.6 %. The global longitudinal strain is  abnormal.   2. Right ventricular systolic function is normal. The right ventricular  size is normal.   3. The mitral valve is grossly normal. Trivial mitral valve  regurgitation. Moderate mitral annular calcification.   4. The aortic valve is calcified. Aortic valve regurgitation is mild.     Wynonia Musty [facility] Short Stay Center/Anesthesiology Phone [phone] 11/28/2021 4:10 PM

## 2021-11-28 NOTE — Anesthesia Preprocedure Evaluation (Addendum)
Anesthesia Evaluation  Patient identified by MRN, date of birth, ID band Patient awake    Reviewed: Allergy & Precautions, NPO status , Patient's Chart, lab work & pertinent test results, reviewed documented beta blocker date and time   History of Anesthesia Complications (+) PONV and history of anesthetic complications  Airway Mallampati: II  TM Distance: >3 FB Neck ROM: Full    Dental  (+) Edentulous Upper, Missing,    Pulmonary COPD, Current Smoker and Patient abstained from smoking.,    Pulmonary exam normal        Cardiovascular hypertension, Pt. on medications and Pt. 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  Reproductive/Obstetrics negative OB ROS                           Anesthesia Physical Anesthesia Plan  ASA: 3  Anesthesia Plan: General   Post-op Pain Management: Tylenol PO (pre-op)   Induction: Intravenous  PONV Risk Score and Plan: 3 and Treatment may vary due to age or medical condition, Ondansetron, Dexamethasone and Propofol infusion  Airway Management Planned: Oral ETT  Additional Equipment: Arterial line  Intra-op Plan:   Post-operative Plan: Extubation in OR  Informed Consent: I have reviewed the patients History and Physical, chart, labs and discussed the procedure including the risks, benefits and alternatives for the proposed anesthesia with the  patient or authorized representative who has indicated his/her understanding and acceptance.     Dental advisory given  Plan Discussed with: CRNA  Anesthesia Plan Comments: (PAT note by Karoline Caldwell, PA-C: Patient was referred to cardiology for preop evaluation.  Seen by Dr. Fletcher Anon 09/24/2021.  Nuclear stress and echocardiogram ordered.  Stress test was low risk, nonischemic.  Echo showed normal LVEF with mild mitral stenosis and moderate aortic stenosis.  Dr. Fletcher Anon commented on the results 10/07/2021 stating, "echo showed normal ejection fraction with mild mitral stenosis and moderate aortic stenosis. Her stress test was also normal. Based on this, no contraindication for aortobifemoral bypass from a cardiac standpoint. Recommend a follow-up echocardiogram in 1 year to monitor aortic stenosis."  Preop labs reviewed, unremarkable.  EKG 09/24/2021: NSR.  Rate 68.  Possible LAE.  Nuclear stress 10/04/2021:  The patient reported no symptoms during the stress test.  ECG is normal. ECG rhythm shows normal sinus rhythm. Resting ECG shows no ST-segment deviation.  No ST deviation was noted. There were no arrhythmias during stress. There were no arrhythmias during recovery. ECG was interpretable and without significant changes. The ECG was not diagnostic due to pharmacologic protocol.  Overall image quality is good. Image quality affected due to significant extracardiac activity.  LV perfusion is normal. There is no evidence of ischemia. There is no evidence of infarction.  Nuclear stress EF: 69 %. The left ventricular ejection fraction is hyperdynamic (>65%). No evidence of transient ischemic dilation (TID) noted.  The study is normal. The study is low risk.  Prior study available for comparison from 11/22/2013.  TTE 10/03/2021: 1. Left  ventricular ejection fraction, by estimation, is 70 to 75%. The  left ventricle has hyperdynamic function. The left ventricle has no  regional wall  motion abnormalities. Left ventricular diastolic parameters  are consistent with Grade I diastolic  dysfunction (impaired relaxation). The average left ventricular global  longitudinal strain is -15.6 %. The global longitudinal strain is  abnormal.  2. Right ventricular systolic function is normal. The right ventricular  size is normal.  3. The mitral valve is grossly normal. Trivial mitral valve  regurgitation. Moderate mitral annular calcification.  4. The aortic valve is calcified. Aortic valve regurgitation is mild.   )      Anesthesia Quick Evaluation

## 2021-11-29 ENCOUNTER — Other Ambulatory Visit: Payer: Self-pay | Admitting: Vascular Surgery

## 2021-11-29 LAB — SARS CORONAVIRUS 2 (TAT 6-24 HRS): SARS Coronavirus 2: NEGATIVE

## 2021-12-03 ENCOUNTER — Inpatient Hospital Stay (HOSPITAL_COMMUNITY)
Admission: RE | Admit: 2021-12-03 | Discharge: 2021-12-10 | DRG: 271 | Disposition: A | Discharge status: Home / Self Care | Payer: Medicare Other | Attending: Vascular Surgery | Admitting: Vascular Surgery

## 2021-12-03 ENCOUNTER — Inpatient Hospital Stay (HOSPITAL_COMMUNITY): Payer: Medicare Other | Admitting: Physician Assistant

## 2021-12-03 ENCOUNTER — Encounter (HOSPITAL_COMMUNITY)
Admission: RE | Disposition: A | Discharge status: Home / Self Care | Payer: Self-pay | Source: Home / Self Care | Attending: Vascular Surgery

## 2021-12-03 ENCOUNTER — Inpatient Hospital Stay (HOSPITAL_COMMUNITY): Payer: Medicare Other

## 2021-12-03 ENCOUNTER — Encounter (HOSPITAL_COMMUNITY): Payer: Self-pay | Admitting: Vascular Surgery

## 2021-12-03 ENCOUNTER — Other Ambulatory Visit: Payer: Self-pay

## 2021-12-03 ENCOUNTER — Inpatient Hospital Stay (HOSPITAL_COMMUNITY): Payer: Medicare Other | Admitting: Anesthesiology

## 2021-12-03 DIAGNOSIS — J449 Chronic obstructive pulmonary disease, unspecified: Secondary | ICD-10-CM | POA: Diagnosis present

## 2021-12-03 DIAGNOSIS — K219 Gastro-esophageal reflux disease without esophagitis: Secondary | ICD-10-CM | POA: Diagnosis present

## 2021-12-03 DIAGNOSIS — I10 Essential (primary) hypertension: Secondary | ICD-10-CM | POA: Diagnosis not present

## 2021-12-03 DIAGNOSIS — Z79899 Other long term (current) drug therapy: Secondary | ICD-10-CM | POA: Diagnosis not present

## 2021-12-03 DIAGNOSIS — R011 Cardiac murmur, unspecified: Secondary | ICD-10-CM | POA: Diagnosis present

## 2021-12-03 DIAGNOSIS — I350 Nonrheumatic aortic (valve) stenosis: Secondary | ICD-10-CM | POA: Diagnosis not present

## 2021-12-03 DIAGNOSIS — Z823 Family history of stroke: Secondary | ICD-10-CM

## 2021-12-03 DIAGNOSIS — Z8249 Family history of ischemic heart disease and other diseases of the circulatory system: Secondary | ICD-10-CM

## 2021-12-03 DIAGNOSIS — Z7982 Long term (current) use of aspirin: Secondary | ICD-10-CM | POA: Diagnosis not present

## 2021-12-03 DIAGNOSIS — M961 Postlaminectomy syndrome, not elsewhere classified: Secondary | ICD-10-CM | POA: Diagnosis present

## 2021-12-03 DIAGNOSIS — R490 Dysphonia: Secondary | ICD-10-CM | POA: Diagnosis present

## 2021-12-03 DIAGNOSIS — I70222 Atherosclerosis of native arteries of extremities with rest pain, left leg: Principal | ICD-10-CM | POA: Diagnosis present

## 2021-12-03 DIAGNOSIS — E7800 Pure hypercholesterolemia, unspecified: Secondary | ICD-10-CM | POA: Diagnosis present

## 2021-12-03 DIAGNOSIS — K660 Peritoneal adhesions (postprocedural) (postinfection): Secondary | ICD-10-CM | POA: Diagnosis not present

## 2021-12-03 DIAGNOSIS — Z791 Long term (current) use of non-steroidal anti-inflammatories (NSAID): Secondary | ICD-10-CM

## 2021-12-03 DIAGNOSIS — F17210 Nicotine dependence, cigarettes, uncomplicated: Secondary | ICD-10-CM | POA: Diagnosis present

## 2021-12-03 DIAGNOSIS — I959 Hypotension, unspecified: Secondary | ICD-10-CM | POA: Diagnosis not present

## 2021-12-03 DIAGNOSIS — J9811 Atelectasis: Secondary | ICD-10-CM | POA: Diagnosis not present

## 2021-12-03 DIAGNOSIS — E559 Vitamin D deficiency, unspecified: Secondary | ICD-10-CM | POA: Diagnosis present

## 2021-12-03 DIAGNOSIS — Z9889 Other specified postprocedural states: Secondary | ICD-10-CM

## 2021-12-03 DIAGNOSIS — R079 Chest pain, unspecified: Secondary | ICD-10-CM | POA: Diagnosis not present

## 2021-12-03 DIAGNOSIS — I700 Atherosclerosis of aorta: Secondary | ICD-10-CM | POA: Diagnosis not present

## 2021-12-03 DIAGNOSIS — D62 Acute posthemorrhagic anemia: Secondary | ICD-10-CM | POA: Diagnosis not present

## 2021-12-03 DIAGNOSIS — I998 Other disorder of circulatory system: Secondary | ICD-10-CM | POA: Diagnosis not present

## 2021-12-03 DIAGNOSIS — Q251 Coarctation of aorta: Secondary | ICD-10-CM

## 2021-12-03 DIAGNOSIS — I739 Peripheral vascular disease, unspecified: Secondary | ICD-10-CM | POA: Diagnosis present

## 2021-12-03 DIAGNOSIS — I70223 Atherosclerosis of native arteries of extremities with rest pain, bilateral legs: Secondary | ICD-10-CM | POA: Diagnosis not present

## 2021-12-03 DIAGNOSIS — Z9842 Cataract extraction status, left eye: Secondary | ICD-10-CM

## 2021-12-03 DIAGNOSIS — Z888 Allergy status to other drugs, medicaments and biological substances status: Secondary | ICD-10-CM | POA: Diagnosis not present

## 2021-12-03 DIAGNOSIS — I7409 Other arterial embolism and thrombosis of abdominal aorta: Secondary | ICD-10-CM | POA: Diagnosis not present

## 2021-12-03 DIAGNOSIS — Z9841 Cataract extraction status, right eye: Secondary | ICD-10-CM | POA: Diagnosis not present

## 2021-12-03 DIAGNOSIS — R109 Unspecified abdominal pain: Secondary | ICD-10-CM | POA: Diagnosis not present

## 2021-12-03 DIAGNOSIS — Z4682 Encounter for fitting and adjustment of non-vascular catheter: Secondary | ICD-10-CM | POA: Diagnosis not present

## 2021-12-03 HISTORY — PX: AORTA - BILATERAL FEMORAL ARTERY BYPASS GRAFT: SHX1175

## 2021-12-03 LAB — CBC
HCT: 27.7 % — ABNORMAL LOW (ref 36.0–46.0)
HCT: 26.5 % — ABNORMAL LOW (ref 36.0–46.0)
Hemoglobin: 9.2 g/dL — ABNORMAL LOW (ref 12.0–15.0)
Hemoglobin: 9.1 g/dL — ABNORMAL LOW (ref 12.0–15.0)
MCH: 31.7 pg (ref 26.0–34.0)
MCH: 32.4 pg (ref 26.0–34.0)
MCHC: 33.2 g/dL (ref 30.0–36.0)
MCHC: 34.3 g/dL (ref 30.0–36.0)
MCV: 95.5 fL (ref 80.0–100.0)
MCV: 94.3 fL (ref 80.0–100.0)
Platelets: 205 10*3/uL (ref 150–400)
Platelets: 198 10*3/uL (ref 150–400)
RBC: 2.9 MIL/uL — ABNORMAL LOW (ref 3.87–5.11)
RBC: 2.81 MIL/uL — ABNORMAL LOW (ref 3.87–5.11)
RDW: 12.8 % (ref 11.5–15.5)
RDW: 12.8 % (ref 11.5–15.5)
WBC: 24 10*3/uL — ABNORMAL HIGH (ref 4.0–10.5)
WBC: 13.5 10*3/uL — ABNORMAL HIGH (ref 4.0–10.5)
nRBC: 0 % (ref 0.0–0.2)
nRBC: 0 % (ref 0.0–0.2)

## 2021-12-03 LAB — POCT I-STAT 7, (LYTES, BLD GAS, ICA,H+H)
Acid-Base Excess: 0 mmol/L (ref 0.0–2.0)
Acid-base deficit: 3 mmol/L — ABNORMAL HIGH (ref 0.0–2.0)
Acid-base deficit: 2 mmol/L (ref 0.0–2.0)
Bicarbonate: 22.4 mmol/L (ref 20.0–28.0)
Bicarbonate: 25.3 mmol/L (ref 20.0–28.0)
Bicarbonate: 25.6 mmol/L (ref 20.0–28.0)
Calcium, Ion: 1.14 mmol/L — ABNORMAL LOW (ref 1.15–1.40)
Calcium, Ion: 1.15 mmol/L (ref 1.15–1.40)
Calcium, Ion: 1.19 mmol/L (ref 1.15–1.40)
HCT: 28 % — ABNORMAL LOW (ref 36.0–46.0)
HCT: 25 % — ABNORMAL LOW (ref 36.0–46.0)
HCT: 27 % — ABNORMAL LOW (ref 36.0–46.0)
Hemoglobin: 9.5 g/dL — ABNORMAL LOW (ref 12.0–15.0)
Hemoglobin: 8.5 g/dL — ABNORMAL LOW (ref 12.0–15.0)
Hemoglobin: 9.2 g/dL — ABNORMAL LOW (ref 12.0–15.0)
O2 Saturation: 100 %
O2 Saturation: 100 %
O2 Saturation: 100 %
Patient temperature: 35
Patient temperature: 35
Potassium: 2.8 mmol/L — ABNORMAL LOW (ref 3.5–5.1)
Potassium: 3.5 mmol/L (ref 3.5–5.1)
Potassium: 3.6 mmol/L (ref 3.5–5.1)
Sodium: 140 mmol/L (ref 135–145)
Sodium: 137 mmol/L (ref 135–145)
Sodium: 137 mmol/L (ref 135–145)
TCO2: 24 mmol/L (ref 22–32)
TCO2: 27 mmol/L (ref 22–32)
TCO2: 27 mmol/L (ref 22–32)
pCO2 arterial: 42.5 mmHg (ref 32.0–48.0)
pCO2 arterial: 44.3 mmHg (ref 32.0–48.0)
pCO2 arterial: 50.5 mmHg — ABNORMAL HIGH (ref 32.0–48.0)
pH, Arterial: 7.329 — ABNORMAL LOW (ref 7.350–7.450)
pH, Arterial: 7.298 — ABNORMAL LOW (ref 7.350–7.450)
pH, Arterial: 7.36 (ref 7.350–7.450)
pO2, Arterial: 286 mmHg — ABNORMAL HIGH (ref 83.0–108.0)
pO2, Arterial: 193 mmHg — ABNORMAL HIGH (ref 83.0–108.0)
pO2, Arterial: 201 mmHg — ABNORMAL HIGH (ref 83.0–108.0)

## 2021-12-03 LAB — BASIC METABOLIC PANEL
Anion gap: 7 (ref 5–15)
BUN: 11 mg/dL (ref 8–23)
CO2: 23 mmol/L (ref 22–32)
Calcium: 8 mg/dL — ABNORMAL LOW (ref 8.9–10.3)
Chloride: 106 mmol/L (ref 98–111)
Creatinine, Ser: 0.9 mg/dL (ref 0.44–1.00)
GFR, Estimated: >60 mL/min (ref >60)
Glucose, Bld: 213 mg/dL — ABNORMAL HIGH (ref 70–99)
Potassium: 3.7 mmol/L (ref 3.5–5.1)
Sodium: 136 mmol/L (ref 135–145)

## 2021-12-03 LAB — COMPREHENSIVE METABOLIC PANEL
ALT: 12 U/L (ref 0–44)
AST: 29 U/L (ref 15–41)
Albumin: 3 g/dL — ABNORMAL LOW (ref 3.5–5.0)
Alkaline Phosphatase: 39 U/L (ref 38–126)
Anion gap: 8 (ref 5–15)
BUN: 14 mg/dL (ref 8–23)
CO2: 23 mmol/L (ref 22–32)
Calcium: 7.9 mg/dL — ABNORMAL LOW (ref 8.9–10.3)
Chloride: 102 mmol/L (ref 98–111)
Creatinine, Ser: 0.83 mg/dL (ref 0.44–1.00)
GFR, Estimated: >60 mL/min (ref >60)
Glucose, Bld: 161 mg/dL — ABNORMAL HIGH (ref 70–99)
Potassium: 3.9 mmol/L (ref 3.5–5.1)
Sodium: 133 mmol/L — ABNORMAL LOW (ref 135–145)
Total Bilirubin: 0.6 mg/dL (ref 0.3–1.2)
Total Protein: 5.2 g/dL — ABNORMAL LOW (ref 6.5–8.1)

## 2021-12-03 LAB — BLOOD GAS, ARTERIAL
Acid-base deficit: 3.6 mmol/L — ABNORMAL HIGH (ref 0.0–2.0)
Bicarbonate: 22 mmol/L (ref 20.0–28.0)
FIO2: 44
O2 Saturation: 97.1 %
Patient temperature: 36.1
pCO2 arterial: 45.9 mmHg (ref 32.0–48.0)
pH, Arterial: 7.296 — ABNORMAL LOW (ref 7.350–7.450)
pO2, Arterial: 156 mmHg — ABNORMAL HIGH (ref 83.0–108.0)

## 2021-12-03 LAB — POCT ACTIVATED CLOTTING TIME
Activated Clotting Time: 245 seconds
Activated Clotting Time: 251 seconds
Activated Clotting Time: 269 seconds

## 2021-12-03 LAB — MAGNESIUM: Magnesium: 1.4 mg/dL — ABNORMAL LOW (ref 1.7–2.4)

## 2021-12-03 LAB — APTT: aPTT: 33 seconds (ref 24–36)

## 2021-12-03 LAB — PROTIME-INR
INR: 1.3 — ABNORMAL HIGH (ref 0.8–1.2)
Prothrombin Time: 15.7 seconds — ABNORMAL HIGH (ref 11.4–15.2)

## 2021-12-03 LAB — PREPARE RBC (CROSSMATCH)

## 2021-12-03 SURGERY — CREATION, BYPASS, ARTERIAL, AORTA TO FEMORAL, BILATERAL, USING GRAFT
Anesthesia: General | Laterality: Bilateral

## 2021-12-03 MED ORDER — PROTAMINE SULFATE 10 MG/ML IV SOLN
INTRAVENOUS | Status: DC | PRN
  Administered 2021-12-03: 50 mg via INTRAVENOUS

## 2021-12-03 MED ORDER — METOPROLOL TARTRATE 12.5 MG HALF TABLET: 12.5000 mg | ORAL_TABLET | Freq: Once | ORAL | Status: AC

## 2021-12-03 MED ORDER — ACETAMINOPHEN 325 MG RE SUPP: 325.0000 mg | RECTAL | Status: DC | PRN

## 2021-12-03 MED ORDER — FENTANYL CITRATE (PF) 100 MCG/2ML IJ SOLN
INTRAMUSCULAR | Status: AC
  Filled 2021-12-03: qty 2

## 2021-12-03 MED ORDER — PHENYLEPHRINE HCL-NACL 20-0.9 MG/250ML-% IV SOLN
INTRAVENOUS | Status: DC | PRN
Start: 2021-12-03 — End: 2021-12-03
  Administered 2021-12-03: 50 ug/min via INTRAVENOUS

## 2021-12-03 MED ORDER — CHLORHEXIDINE GLUCONATE 0.12 % MT SOLN
15.0000 mL | Freq: Once | OROMUCOSAL | Status: AC
  Administered 2021-12-03: 15 mL via OROMUCOSAL
  Filled 2021-12-03: qty 15

## 2021-12-03 MED ORDER — FENTANYL CITRATE (PF) 250 MCG/5ML IJ SOLN
INTRAMUSCULAR | Status: AC
  Filled 2021-12-03: qty 5

## 2021-12-03 MED ORDER — ONDANSETRON HCL 4 MG/2ML IJ SOLN
4.0000 mg | Freq: Four times a day (QID) | INTRAMUSCULAR | Status: DC | PRN
  Administered 2021-12-03 – 2021-12-04 (×2): 4 mg via INTRAVENOUS
  Filled 2021-12-03 (×2): qty 2

## 2021-12-03 MED ORDER — OXYCODONE HCL 5 MG/5ML PO SOLN: 5.0000 mg | Freq: Once | ORAL | Status: DC | PRN

## 2021-12-03 MED ORDER — ALBUMIN HUMAN 5 % IV SOLN: INTRAVENOUS | Status: DC | PRN

## 2021-12-03 MED ORDER — PROPOFOL 10 MG/ML IV BOLUS
INTRAVENOUS | Status: AC
  Filled 2021-12-03: qty 20

## 2021-12-03 MED ORDER — FENTANYL CITRATE (PF) 250 MCG/5ML IJ SOLN
INTRAMUSCULAR | Status: DC | PRN
  Administered 2021-12-03 (×10): 50 ug via INTRAVENOUS

## 2021-12-03 MED ORDER — PHENOL 1.4 % MT LIQD
1.0000 | OROMUCOSAL | Status: DC | PRN
  Administered 2021-12-05: 1 via OROMUCOSAL
  Filled 2021-12-03: qty 177

## 2021-12-03 MED ORDER — CHLORHEXIDINE GLUCONATE CLOTH 2 % EX PADS: 6.0000 | MEDICATED_PAD | Freq: Once | CUTANEOUS | Status: DC

## 2021-12-03 MED ORDER — ROCURONIUM BROMIDE 10 MG/ML (PF) SYRINGE
PREFILLED_SYRINGE | INTRAVENOUS | Status: DC | PRN
  Administered 2021-12-03: 20 mg via INTRAVENOUS
  Administered 2021-12-03: 30 mg via INTRAVENOUS
  Administered 2021-12-03: 50 mg via INTRAVENOUS

## 2021-12-03 MED ORDER — PANTOPRAZOLE SODIUM 40 MG PO TBEC
40.0000 mg | DELAYED_RELEASE_TABLET | Freq: Every day | ORAL | Status: DC
  Filled 2021-12-03: qty 1

## 2021-12-03 MED ORDER — ALUM & MAG HYDROXIDE-SIMETH 200-200-20 MG/5ML PO SUSP: 15.0000 mL | ORAL | Status: DC | PRN

## 2021-12-03 MED ORDER — PLASMA-LYTE A IV SOLN: INTRAVENOUS | Status: DC

## 2021-12-03 MED ORDER — PHENYLEPHRINE 40 MCG/ML (10ML) SYRINGE FOR IV PUSH (FOR BLOOD PRESSURE SUPPORT)
PREFILLED_SYRINGE | INTRAVENOUS | Status: DC | PRN
  Administered 2021-12-03: 40 ug via INTRAVENOUS
  Administered 2021-12-03: 80 ug via INTRAVENOUS
  Administered 2021-12-03 (×3): 40 ug via INTRAVENOUS

## 2021-12-03 MED ORDER — ACETAMINOPHEN 325 MG PO TABS: 325.0000 mg | ORAL_TABLET | ORAL | Status: DC | PRN

## 2021-12-03 MED ORDER — LIDOCAINE 2% (20 MG/ML) 5 ML SYRINGE
INTRAMUSCULAR | Status: AC
  Filled 2021-12-03: qty 5

## 2021-12-03 MED ORDER — LACTATED RINGERS IV SOLN: INTRAVENOUS | Status: DC | PRN

## 2021-12-03 MED ORDER — ONDANSETRON HCL 4 MG/2ML IJ SOLN
INTRAMUSCULAR | Status: DC | PRN
  Administered 2021-12-03: 4 mg via INTRAVENOUS

## 2021-12-03 MED ORDER — OXYCODONE HCL 5 MG PO TABS: 5.0000 mg | ORAL_TABLET | Freq: Once | ORAL | Status: DC | PRN

## 2021-12-03 MED ORDER — LACTATED RINGERS IV SOLN: INTRAVENOUS | Status: DC

## 2021-12-03 MED ORDER — CHLORHEXIDINE GLUCONATE 0.12 % MT SOLN
15.0000 mL | Freq: Two times a day (BID) | OROMUCOSAL | Status: DC
  Administered 2021-12-03 – 2021-12-10 (×10): 15 mL via OROMUCOSAL
  Filled 2021-12-03 (×10): qty 15

## 2021-12-03 MED ORDER — METOPROLOL TARTRATE 5 MG/5ML IV SOLN: 2.0000 mg | INTRAVENOUS | Status: DC | PRN

## 2021-12-03 MED ORDER — HEPARIN SODIUM (PORCINE) 5000 UNIT/ML IJ SOLN
5000.0000 [IU] | Freq: Three times a day (TID) | INTRAMUSCULAR | Status: DC
  Administered 2021-12-04 – 2021-12-10 (×19): 5000 [IU] via SUBCUTANEOUS
  Filled 2021-12-03 (×19): qty 1

## 2021-12-03 MED ORDER — HEPARIN SODIUM (PORCINE) 1000 UNIT/ML IJ SOLN
INTRAMUSCULAR | Status: AC
  Filled 2021-12-03: qty 10

## 2021-12-03 MED ORDER — HYDRALAZINE HCL 20 MG/ML IJ SOLN: 5.0000 mg | INTRAMUSCULAR | Status: DC | PRN

## 2021-12-03 MED ORDER — SODIUM CHLORIDE 0.9 % IV SOLN
500.0000 mL | Freq: Once | INTRAVENOUS | Status: AC | PRN
  Administered 2021-12-10: 500 mL via INTRAVENOUS

## 2021-12-03 MED ORDER — PANTOPRAZOLE SODIUM 40 MG IV SOLR
40.0000 mg | INTRAVENOUS | Status: DC
  Administered 2021-12-03: 40 mg via INTRAVENOUS
  Filled 2021-12-03: qty 40

## 2021-12-03 MED ORDER — CEFAZOLIN SODIUM-DEXTROSE 2-4 GM/100ML-% IV SOLN
2.0000 g | INTRAVENOUS | Status: AC
  Administered 2021-12-03: 2 g via INTRAVENOUS
  Filled 2021-12-03: qty 100

## 2021-12-03 MED ORDER — HEMOSTATIC AGENTS (NO CHARGE) OPTIME
TOPICAL | Status: DC | PRN
Start: 2021-12-03 — End: 2021-12-03
  Administered 2021-12-03: 2 via TOPICAL
  Administered 2021-12-03: 1 via TOPICAL

## 2021-12-03 MED ORDER — PLASMA-LYTE 148 IV SOLN: INTRAVENOUS | Status: DC

## 2021-12-03 MED ORDER — PHENYLEPHRINE 40 MCG/ML (10ML) SYRINGE FOR IV PUSH (FOR BLOOD PRESSURE SUPPORT)
PREFILLED_SYRINGE | INTRAVENOUS | Status: AC
  Filled 2021-12-03: qty 10

## 2021-12-03 MED ORDER — ORAL CARE MOUTH RINSE: 15.0000 mL | Freq: Once | OROMUCOSAL | Status: AC

## 2021-12-03 MED ORDER — POTASSIUM CHLORIDE CRYS ER 20 MEQ PO TBCR: 20.0000 meq | EXTENDED_RELEASE_TABLET | Freq: Every day | ORAL | Status: DC | PRN

## 2021-12-03 MED ORDER — ROCURONIUM BROMIDE 10 MG/ML (PF) SYRINGE
PREFILLED_SYRINGE | INTRAVENOUS | Status: AC
  Filled 2021-12-03: qty 10

## 2021-12-03 MED ORDER — DEXAMETHASONE SODIUM PHOSPHATE 10 MG/ML IJ SOLN
INTRAMUSCULAR | Status: DC | PRN
Start: 2021-12-03 — End: 2021-12-03
  Administered 2021-12-03: 5 mg via INTRAVENOUS

## 2021-12-03 MED ORDER — ESMOLOL HCL 100 MG/10ML IV SOLN
INTRAVENOUS | Status: AC
  Filled 2021-12-03: qty 10

## 2021-12-03 MED ORDER — SUGAMMADEX SODIUM 200 MG/2ML IV SOLN
INTRAVENOUS | Status: DC | PRN
  Administered 2021-12-03: 200 mg via INTRAVENOUS

## 2021-12-03 MED ORDER — MORPHINE SULFATE (PF) 2 MG/ML IV SOLN
2.0000 mg | INTRAVENOUS | Status: DC | PRN
  Administered 2021-12-03: 21:00:00 3 mg via INTRAVENOUS
  Administered 2021-12-03 (×3): 2 mg via INTRAVENOUS
  Administered 2021-12-03 – 2021-12-04 (×2): 3 mg via INTRAVENOUS
  Administered 2021-12-04: 03:00:00 2 mg via INTRAVENOUS
  Administered 2021-12-04: 3 mg via INTRAVENOUS
  Administered 2021-12-04 – 2021-12-05 (×2): 2 mg via INTRAVENOUS
  Filled 2021-12-03 (×3): qty 2
  Filled 2021-12-03 (×4): qty 1
  Filled 2021-12-03: qty 2
  Filled 2021-12-03: qty 1
  Filled 2021-12-03: qty 2
  Filled 2021-12-03: qty 1

## 2021-12-03 MED ORDER — EPHEDRINE 5 MG/ML INJ
INTRAVENOUS | Status: AC
  Filled 2021-12-03: qty 5

## 2021-12-03 MED ORDER — HEPARIN SODIUM (PORCINE) 1000 UNIT/ML IJ SOLN
INTRAMUSCULAR | Status: DC | PRN
  Administered 2021-12-03: 5000 [IU] via INTRAVENOUS
  Administered 2021-12-03 (×2): 2000 [IU] via INTRAVENOUS

## 2021-12-03 MED ORDER — 0.9 % SODIUM CHLORIDE (POUR BTL) OPTIME
TOPICAL | Status: DC | PRN
  Administered 2021-12-03: 2000 mL
  Administered 2021-12-03: 1000 mL

## 2021-12-03 MED ORDER — LIDOCAINE 2% (20 MG/ML) 5 ML SYRINGE
INTRAMUSCULAR | Status: DC | PRN
  Administered 2021-12-03: 60 mg via INTRAVENOUS

## 2021-12-03 MED ORDER — LABETALOL HCL 5 MG/ML IV SOLN: 10.0000 mg | INTRAVENOUS | Status: DC | PRN

## 2021-12-03 MED ORDER — EPHEDRINE SULFATE-NACL 50-0.9 MG/10ML-% IV SOSY
PREFILLED_SYRINGE | INTRAVENOUS | Status: DC | PRN
  Administered 2021-12-03: 15 mg via INTRAVENOUS

## 2021-12-03 MED ORDER — ESMOLOL HCL 100 MG/10ML IV SOLN
INTRAVENOUS | Status: DC | PRN
  Administered 2021-12-03: 20 ug via INTRAVENOUS

## 2021-12-03 MED ORDER — METOPROLOL TARTRATE 12.5 MG HALF TABLET
ORAL_TABLET | ORAL | Status: AC
  Administered 2021-12-03: 12.5 mg via ORAL
  Filled 2021-12-03: qty 1

## 2021-12-03 MED ORDER — SURGIFLO WITH THROMBIN (HEMOSTATIC MATRIX KIT) OPTIME
TOPICAL | Status: DC | PRN
  Administered 2021-12-03: 1 via TOPICAL

## 2021-12-03 MED ORDER — ORAL CARE MOUTH RINSE
15.0000 mL | Freq: Two times a day (BID) | OROMUCOSAL | Status: DC
  Administered 2021-12-06 – 2021-12-08 (×2): 15 mL via OROMUCOSAL

## 2021-12-03 MED ORDER — ACETAMINOPHEN 500 MG PO TABS
1000.0000 mg | ORAL_TABLET | Freq: Once | ORAL | Status: AC
  Administered 2021-12-03: 1000 mg via ORAL
  Filled 2021-12-03: qty 2

## 2021-12-03 MED ORDER — HEPARIN 6000 UNIT IRRIGATION SOLUTION
Status: DC | PRN
  Administered 2021-12-03: 1

## 2021-12-03 MED ORDER — CEFAZOLIN SODIUM-DEXTROSE 2-4 GM/100ML-% IV SOLN
2.0000 g | Freq: Three times a day (TID) | INTRAVENOUS | Status: AC
  Administered 2021-12-03 (×2): 2 g via INTRAVENOUS
  Filled 2021-12-03 (×2): qty 100

## 2021-12-03 MED ORDER — FENTANYL CITRATE (PF) 100 MCG/2ML IJ SOLN
25.0000 ug | INTRAMUSCULAR | Status: DC | PRN
  Administered 2021-12-03: 50 ug via INTRAVENOUS
  Administered 2021-12-03: 25 ug via INTRAVENOUS

## 2021-12-03 MED ORDER — HEPARIN 6000 UNIT IRRIGATION SOLUTION
Status: AC
  Filled 2021-12-03: qty 500

## 2021-12-03 MED ORDER — MAGNESIUM SULFATE 2 GM/50ML IV SOLN
2.0000 g | Freq: Every day | INTRAVENOUS | Status: AC | PRN
  Administered 2021-12-04: 2 g via INTRAVENOUS
  Filled 2021-12-03: qty 50

## 2021-12-03 MED ORDER — CHLORHEXIDINE GLUCONATE CLOTH 2 % EX PADS
6.0000 | MEDICATED_PAD | Freq: Every day | CUTANEOUS | Status: DC
  Administered 2021-12-03 – 2021-12-10 (×7): 6 via TOPICAL

## 2021-12-03 MED ORDER — GUAIFENESIN-DM 100-10 MG/5ML PO SYRP: 15.0000 mL | ORAL_SOLUTION | ORAL | Status: DC | PRN

## 2021-12-03 MED ORDER — PROPOFOL 10 MG/ML IV BOLUS
INTRAVENOUS | Status: DC | PRN
  Administered 2021-12-03: 50 mg via INTRAVENOUS
  Administered 2021-12-03: 80 mg via INTRAVENOUS
  Administered 2021-12-03: 30 mg via INTRAVENOUS
  Administered 2021-12-03: 20 mg via INTRAVENOUS

## 2021-12-03 MED ORDER — SODIUM CHLORIDE 0.9 % IV SOLN: 10.0000 mL/h | Freq: Once | INTRAVENOUS | Status: DC

## 2021-12-03 MED ORDER — SODIUM CHLORIDE 0.9 % IV SOLN: INTRAVENOUS | Status: DC

## 2021-12-03 SURGICAL SUPPLY — 60 items
ADH SKN CLS APL DERMABOND .7 (GAUZE/BANDAGES/DRESSINGS) ×3
AGENT HMST KT MTR STRL THRMB (HEMOSTASIS) ×1
BAG COUNTER SPONGE SURGICOUNT (BAG) IMPLANT
BAG ISL DRAPE 18X18 STRL (DRAPES) ×2
BAG ISOLATION DRAPE 18X18 (DRAPES) ×2 IMPLANT
BAG SPNG CNTER NS LX DISP (BAG)
CANISTER SUCT 3000ML PPV (MISCELLANEOUS) ×2 IMPLANT
CLIP LIGATING EXTRA MED SLVR (CLIP) ×3 IMPLANT
CLIP LIGATING EXTRA SM BLUE (MISCELLANEOUS) ×5 IMPLANT
COVER SURGICAL LIGHT HANDLE (MISCELLANEOUS) ×1 IMPLANT
COVER ULTRASOUND PROBE 36 ST (MISCELLANEOUS) ×1 IMPLANT
DERMABOND ADVANCED (GAUZE/BANDAGES/DRESSINGS) ×3
DERMABOND ADVANCED .7 DNX12 (GAUZE/BANDAGES/DRESSINGS) ×3 IMPLANT
DRAPE ISOLATION BAG 18X18 (DRAPES) ×4
ELECT BLADE 4.0 EZ CLEAN MEGAD (MISCELLANEOUS)
ELECT BLADE 6.5 EXT (BLADE) IMPLANT
ELECT REM PT RETURN 9FT ADLT (ELECTROSURGICAL) ×2
ELECTRODE BLDE 4.0 EZ CLN MEGD (MISCELLANEOUS) IMPLANT
ELECTRODE REM PT RTRN 9FT ADLT (ELECTROSURGICAL) ×1 IMPLANT
FELT TEFLON 1X6 (MISCELLANEOUS) ×1 IMPLANT
GLOVE SRG 8 PF TXTR STRL LF DI (GLOVE) ×2 IMPLANT
GLOVE SURG POLYISO LF SZ7 (GLOVE) ×1 IMPLANT
GLOVE SURG POLYISO LF SZ8 (GLOVE) IMPLANT
GLOVE SURG UNDER POLY LF SZ8 (GLOVE) ×4
GOWN STRL REUS W/ TWL LRG LVL3 (GOWN DISPOSABLE) ×3 IMPLANT
GOWN STRL REUS W/TWL 2XL LVL3 (GOWN DISPOSABLE) ×2 IMPLANT
GOWN STRL REUS W/TWL LRG LVL3 (GOWN DISPOSABLE) ×6
GRAFT HEMASHIELD 14X7MM (Vascular Products) ×1 IMPLANT
HEMOSTAT SNOW SURGICEL 2X4 (HEMOSTASIS) ×3 IMPLANT
INSERT FOGARTY 61MM (MISCELLANEOUS) ×2 IMPLANT
INSERT FOGARTY SM (MISCELLANEOUS) ×4 IMPLANT
KIT BASIN OR (CUSTOM PROCEDURE TRAY) ×2 IMPLANT
KIT TURNOVER KIT B (KITS) ×2 IMPLANT
NS IRRIG 1000ML POUR BTL (IV SOLUTION) ×6 IMPLANT
PACK AORTA (CUSTOM PROCEDURE TRAY) ×2 IMPLANT
PAD ARMBOARD 7.5X6 YLW CONV (MISCELLANEOUS) ×4 IMPLANT
RETAINER VISCERA MED (MISCELLANEOUS) ×2 IMPLANT
SPONGE T-LAP 18X18 ~~LOC~~+RFID (SPONGE) ×3 IMPLANT
SURGIFLO W/THROMBIN 8M KIT (HEMOSTASIS) ×1 IMPLANT
SUT ETHIBOND 5 LR DA (SUTURE) IMPLANT
SUT MNCRL AB 4-0 PS2 18 (SUTURE) ×9 IMPLANT
SUT PDS AB 1 TP1 96 (SUTURE) ×4 IMPLANT
SUT PROLENE 3 0 SH1 36 (SUTURE) ×9 IMPLANT
SUT PROLENE 5 0 C 1 24 (SUTURE) ×4 IMPLANT
SUT PROLENE 5 0 C 1 36 (SUTURE) IMPLANT
SUT PROLENE 6 0 BV (SUTURE) ×2 IMPLANT
SUT SILK 2 0 (SUTURE) ×2
SUT SILK 2 0 SH CR/8 (SUTURE) ×2 IMPLANT
SUT SILK 2-0 18XBRD TIE 12 (SUTURE) ×1 IMPLANT
SUT SILK 4 0 (SUTURE) ×2
SUT SILK 4-0 18XBRD TIE 12 (SUTURE) ×1 IMPLANT
SUT VIC AB 2-0 CT1 27 (SUTURE) ×16
SUT VIC AB 2-0 CT1 TAPERPNT 27 (SUTURE) ×4 IMPLANT
SUT VIC AB 3-0 SH 27 (SUTURE) ×12
SUT VIC AB 3-0 SH 27X BRD (SUTURE) ×6 IMPLANT
TAPE UMBILICAL 1/8X30 (MISCELLANEOUS) ×3 IMPLANT
TOWEL GREEN STERILE (TOWEL DISPOSABLE) ×2 IMPLANT
TOWEL ~~LOC~~+RFID 17X26 BLUE (SPONGE) ×2 IMPLANT
TRAY FOLEY MTR SLVR 16FR STAT (SET/KITS/TRAYS/PACK) ×2 IMPLANT
WATER STERILE IRR 1000ML POUR (IV SOLUTION) ×4 IMPLANT

## 2021-12-03 NOTE — H&P (Signed)
Hospital H&P    MRN #:  [id number]  History of Present Illness: This is a 81 y.o. female presenting for aortobifemoral bypass.   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  She describes her leg pain as left greater than right, occurring at rest, waking her up at night.  She is only able to sleep 2 hours at a time due to the pain.  She feels as though her legs will give out due to pain should she walk greater than 20 feet. Patient is a non-smoker with significant family history of vascular disease.  Her mother's first vascular surgery occurred at the age of 53.  Multiple family members have died from stroke at an early age.   Since seen last, she has had no improvement in her symptoms.  She underwent cardiac work-up demonstrating a normal stress test.  This was done in anticipation for aortobifemoral bypass discussion, which was started post angiogram.  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  Past Medical History:  Diagnosis Date   Abdominal cramping    possibly irritable bowel syndrome versus adhesions secondary to multiple surgeries and abdominal inflammation and 2009 secondary to diverticulitis and diverticular phlegmon   Allergic rhinitis    Arthritis    Carotid artery disease (HCC)    38-25% RICA, < 05% LICA 39/7/67 [name] ([facility])   Cataract    Chest pain syndrome 2004   with adenosine Cardiolite that was normal   Chronic hoarseness    COPD (chronic obstructive pulmonary disease) (HCC)    Dyspnea    GERD (gastroesophageal reflux disease)    Heart murmur    Mild AS, AI. AV pk grad 18.79 mmHg, mn grad 11.20 mmHg, AVA (VTI) 1.05 cm2 10/10/20, 3 year f/u rec ([facility])   History of kidney stones    History of renal calculi    Hypercholesterolemia    Hypertension     Incontinence    Cough incontinence   Mild depression    Neuromuscular disorder (HCC)    neuropathy bilateral lower extremities   Osteopenia    Peripheral vascular disease ([place])    with femoral and carotid bruits   PONV (postoperative nausea and vomiting)    Tobacco dependence    Vitamin D deficiency     Past Surgical History:  Procedure Laterality Date   ABDOMINAL AORTOGRAM W/LOWER EXTREMITY N/A 09/18/2021   Procedure: ABDOMINAL AORTOGRAM W/LOWER EXTREMITY;  Surgeon: [name], MD;  Location: [facility];  Service: Cardiovascular;  Laterality: N/A;   APPENDECTOMY     BREAST BIOPSY     CATARACT EXTRACTION, BILATERAL     COLON SURGERY     2009   COSMETIC SURGERY     on neck and eyes   DILATION AND CURETTAGE OF UTERUS     EYE SURGERY     2014   FEMUR SURGERY  12/2007   left femur surgery--for femoral neck fracture status post fall    FRACTURE SURGERY     broke left leg and left arm- 2012   LIPOSUCTION     LUMBAR LAMINECTOMY/DECOMPRESSION MICRODISCECTOMY N/A 11/12/2020   Procedure: LAMINECTOMY AND FORAMINOTOMY Lumbar three four, Lumbar four five, Lumbar Five Sacral one;  Surgeon: Newman Pies, MD;  Location: [facility];  Service: Neurosurgery;  Laterality: N/A;   OTHER SURGICAL HISTORY     sigmoid colectomy   SHOULDER SURGERY     arthroscopic shoulder surgery    TONSILLECTOMY     TUBAL LIGATION      Allergies  Allergen Reactions   Other Other (See Comments)    Chlorexolone - unknown reaction   Pollen Extract     Allergic to tree pollens   Tegretol [Carbamazepine] Swelling   Lisinopril Rash   Tetracyclines & Related Rash    Prior to Admission medications   Medication Sig Start Date End Date Taking? Authorizing Provider  amLODipine (NORVASC) 5 MG tablet Take 5 mg by mouth at bedtime. 02/14/20  Yes [provider]  aspirin EC 81 MG tablet Take 81 mg by mouth at bedtime. Swallow whole.   Yes [provider]  b complex vitamins capsule Take  1 capsule by mouth daily.   Yes [provider]  cloNIDine (CATAPRES) 0.1 MG tablet Take 0.2 mg by mouth at bedtime.   Yes [provider]  Coenzyme Q10 100 MG capsule Take 100 mg by mouth every morning.   Yes [provider]  famotidine (PEPCID) 20 MG tablet Take 20-40 mg by mouth daily as needed for heartburn.   Yes [provider]  gabapentin (NEURONTIN) 100 MG capsule Take 2 capsules (200 mg total) by mouth at bedtime. PATIENT NEEDS OFFICE VISIT FOR ADDITIONAL REFILLS Patient taking differently: Take 100-200 mg by mouth See admin instructions. Take one capsule (100 mg) by mouth every morning and two capsules (200 mg) at night 07/30/15  Yes Daub, Loura Back, MD  INCRUSE ELLIPTA 62.5 MCG/INH AEPB Inhale 1 puff into the lungs daily. 11/21/20  Yes Rai, Ripudeep K, MD  irbesartan-hydrochlorothiazide (AVALIDE) 150-12.5 MG per tablet Take 1 tablet by mouth every morning.   Yes [provider]  meloxicam (MOBIC) 15 MG tablet Take 15 mg by mouth at bedtime. 04/01/21  Yes [provider]  Menthol, Topical Analgesic, (ICY HOT EX) Apply 1 application topically daily as needed (pain).   Yes [provider]  metoprolol succinate (TOPROL-XL) 25 MG 24 hr tablet Take 25 mg by mouth at bedtime.   Yes [provider]  Multiple Vitamin (MULTIVITAMIN WITH MINERALS) TABS tablet Take 1 tablet by mouth 2 (two) times daily.   Yes [provider]  simvastatin (ZOCOR) 40 MG tablet Take 40 mg by mouth at bedtime.   Yes [provider]  TURMERIC CURCUMIN PO Take 1 capsule by mouth daily. With Ginger   Yes [provider]  VITAMIN D PO Take 1 capsule by mouth daily.   Yes [provider]  VITAMIN E PO Take 1 capsule by mouth daily.   Yes [provider]    Social History   Socioeconomic History   Marital status: Widowed    Spouse name: Not on file   Number of children: Not on file   Years of education: Not  on file   Highest education level: Not on file  Occupational History   Not on file  Tobacco Use   Smoking status: Every Day    Packs/day: 1.00    Years: 60.00    Pack years: 60.00    Types: Cigarettes   Smokeless tobacco: Never  Vaping Use   Vaping Use: Never used  Substance and Sexual Activity   Alcohol use: Yes    Alcohol/week: 0.0 standard drinks    Comment: Occ glass of wine   Drug use: No  Sexual activity: Not on file  Other Topics Concern   Not on file  Social History Narrative   Not on file   Social Determinants of Health   Financial Resource Strain: Not on file  Food Insecurity: Not on file  Transportation Needs: Not on file  Physical Activity: Not on file  Stress: Not on file  Social Connections: Not on file  Intimate Partner Violence: Not on file     Family History  Problem Relation Age of Onset   Heart disease Mother    Stroke Mother     ROS: Otherwise negative unless mentioned in HPI  Physical Examination  Vitals:   12/03/21 0604  BP: (!) 155/54  Pulse: 86  Resp: 18  Temp: 97.9 F (36.6 C)  SpO2: 98%   Body mass index is 21.7 kg/m.  General:  WDWN in NAD; vital signs documented above Gait: Not observed HENT: WNL, normocephalic Pulmonary: normal non-labored breathing , without Rales, rhonchi,  wheezing Cardiac: regular HR,  Abdomen: soft, NT, no masses Skin: without rashes Vascular Exam/Pulses:   Right Left  Radial 2+ (normal) 2+ (normal)  Ulnar 2+ (normal) 2+ (normal)  Femoral 2+ (normal) 1+ (weak)  Popliteal      DP 1+ (weak) absent  PT absent absent    Extremities: without ischemic changes, without Gangrene , without cellulitis; without open wounds;  Musculoskeletal: no muscle wasting or atrophy       Neurologic: A&O X 3;  No focal weakness or paresthesias are detected Psychiatric:  The pt has Normal affect.     Non-Invasive Vascular Imaging: Significant progression since last ABI and 2017    +-------+-----------+-----------+------------+------------+   ABI/TBI Today's ABI Today's TBI Previous ABI Previous TBI   +-------+-----------+-----------+------------+------------+   Right   0.60        0.34                                    +-------+-----------+-----------+------------+------------+   Left    0.32        0.05                                    +-------+-----------+-----------+------------+------------+   CBC    Component Value Date/Time   WBC 8.6 11/27/2021 1031   RBC 4.20 11/27/2021 1031   HGB 13.1 11/27/2021 1031   HCT 40.5 11/27/2021 1031   PLT 211 11/27/2021 1031   MCV 96.4 11/27/2021 1031   MCH 31.2 11/27/2021 1031   MCHC 32.3 11/27/2021 1031   RDW 12.9 11/27/2021 1031   LYMPHSABS 1.1 04/04/2021 1658   MONOABS 1.0 04/04/2021 1658   EOSABS 0.0 04/04/2021 1658   BASOSABS 0.0 04/04/2021 1658    BMET    Component Value Date/Time   NA 135 11/27/2021 1031   K 3.4 (L) 11/27/2021 1031   CL 101 11/27/2021 1031   CO2 24 11/27/2021 1031   GLUCOSE 97 11/27/2021 1031   BUN 15 11/27/2021 1031   CREATININE 1.19 (H) 11/27/2021 1031   CALCIUM 9.4 11/27/2021 1031   GFRNONAA 46 (L) 11/27/2021 1031   GFRAA >60 04/11/2020 1053    COAGS: Lab Results  Component Value Date   INR 1.0 11/27/2021   INR 1.4 (H) 04/05/2021   INR 1.2 04/04/2021      ASSESSMENT/PLAN: This is a 81 y.o. 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  Patient presents today for aorto bifemoral bypass. After discussing the risks and benefits of surgery, [place]a elected to proceed.  She is aware she is at increased risk due to having previous abdominal surgery.  The op note was not available, but I believe she had a colectomy for diverticulitis   [name] MD [facility] [phone] 12/03/2021  7:19 AM

## 2021-12-03 NOTE — Anesthesia Procedure Notes (Signed)
Procedure Name: Intubation Date/Time: 12/03/2021 7:40 AM Performed by: Erick Colace, CRNA Pre-anesthesia Checklist: Patient identified, Emergency Drugs available, Suction available and Patient being monitored Patient Re-evaluated:Patient Re-evaluated prior to induction Oxygen Delivery Method: Circle system utilized Preoxygenation: Pre-oxygenation with 100% oxygen Induction Type: IV induction Ventilation: Mask ventilation without difficulty Laryngoscope Size: Mac and 3 Grade View: Grade I Tube type: Oral Number of attempts: 1 Airway Equipment and Method: Stylet and Oral airway Placement Confirmation: ETT inserted through vocal cords under direct vision, positive ETCO2 and breath sounds checked- equal and bilateral Secured at: 21 cm Tube secured with: Tape Dental Injury: Teeth and Oropharynx as per pre-operative assessment

## 2021-12-03 NOTE — Transfer of Care (Signed)
Immediate Anesthesia Transfer of Care Note  Patient: Elizabeth [name]  Procedure(s) Performed: AORTOBIFEMORAL BYPASS GRAFT USING A 14 X 31mm HEMASHIELD GOLD GRAFT (Bilateral)  Patient Location: PACU  Anesthesia Type:General  Level of Consciousness: drowsy  Airway & Oxygen Therapy: Patient Spontanous Breathing and Patient connected to face mask oxygen  Post-op Assessment: Report given to RN and Post -op Vital signs reviewed and stable  Post vital signs: Reviewed and stable  Last Vitals:  Vitals Value Taken Time  BP 105/49 12/03/21 1154  Temp    Pulse 86 12/03/21 1155  Resp 11 12/03/21 1155  SpO2 99 % 12/03/21 1155  Vitals shown include unvalidated device data.  Last Pain:  Vitals:   12/03/21 0604  TempSrc: Oral  PainSc: 6       Patients Stated Pain Goal: 3 (24/82/50 0370)  Complications: No notable events documented.

## 2021-12-03 NOTE — Progress Notes (Signed)
°  Transition of Care [facility]) Screening Note   Patient Details  Name: Elizabeth [name] Date of Birth: Apr 14, 1941   Transition of Care [facility]) CM/SW Contact:    Milas Gain, [place] Phone Number: 12/03/2021, 5:13 PM    Transition of Care Department [facility]) has reviewed patient and no TOC needs have been identified at this time. We will continue to monitor patient advancement through interdisciplinary progression rounds. If new patient transition needs arise, please place a TOC consult.

## 2021-12-03 NOTE — Anesthesia Postprocedure Evaluation (Signed)
Anesthesia Post Note  Patient: Elizabeth [name]  Procedure(s) Performed: AORTOBIFEMORAL BYPASS GRAFT USING A 14 X 99mm HEMASHIELD GOLD GRAFT (Bilateral)     Patient location during evaluation: PACU Anesthesia Type: General Level of consciousness: awake and alert Pain management: pain level controlled Vital Signs Assessment: post-procedure vital signs reviewed and stable Respiratory status: spontaneous breathing, nonlabored ventilation and respiratory function stable Cardiovascular status: blood pressure returned to baseline Postop Assessment: no apparent nausea or vomiting Anesthetic complications: no   No notable events documented.  Last Vitals:  Vitals:   12/03/21 1339 12/03/21 1354  BP: (!) 122/56 (!) 122/56  Pulse: 98 (!) 101  Resp: 15 14  Temp:    SpO2: 99% 100%    Last Pain:  Vitals:   12/03/21 1354  TempSrc:   PainSc: Asleep                 Marthenia Rolling

## 2021-12-03 NOTE — Anesthesia Procedure Notes (Signed)
Arterial Line Insertion Start/End1/31/2023 7:00 AM, 12/03/2021 7:05 AM Performed by: CRNA  Patient location: Pre-op. Preanesthetic checklist: patient identified, IV checked, site marked, risks and benefits discussed, surgical consent, monitors and equipment checked, pre-op evaluation, timeout performed and anesthesia consent Lidocaine 1% used for infiltration Right, radial was placed Catheter size: 20 G Hand hygiene performed  and maximum sterile barriers used   Attempts: 2 Procedure performed without using ultrasound guided technique. Following insertion, dressing applied and Biopatch. Post procedure assessment: normal and unchanged

## 2021-12-03 NOTE — Op Note (Addendum)
NAME: Elizabeth [name]    MRN: [id number] DOB: 22-Dec-1940    DATE OF OPERATION: 12/03/2021  PREOP DIAGNOSIS:    Critical limb ischemia  POSTOP DIAGNOSIS:    Same  PROCEDURE:    Aortobifemoral bypass Aortic endarterectomy Greater than 30 minutes lysis of adhesions Ligation of large vessel-inferior mesenteric vein Bilateral common femoral artery endarterectomy  SURGEON: [name]  CO-SURGEON: Dr. Servando Snare  ANESTHESIA: General  EBL: 1 L  INDICATIONS:    Elizabeth [name] is a 81 y.o. female This is a 81 y.o. female with Rutherford 4 critical limb ischemia in the left leg, Rutherford 3 critical limb ischemia in the right leg.  Patient presents today for aorto bifemoral bypass. After discussing the risks and benefits of surgery, [name] elected to proceed.  She is aware she is at increased risk due to having previous abdominal surgery.  The op note was not available, but I believe she had a colectomy for diverticulitis  FINDINGS:   Dense adhesions within the peritoneum Infrarenal, end to end anastomosis using 14 x 7 bifurcated graft sewn to the common femoral artery bilaterally. Bilateral common femoral endarterectomy  TECHNIQUE:   Patient was brought to the OR laid in supine position.  General anesthesia was induced and the patient was prepped and draped in standard fashion.  A timeout was performed.  The case began with ultrasound insonation of bilateral femoral arteries to determine the common femoral artery bifurcation.  Oblique incisions were made over both common femoral arteries at the bifurcations.  The common femoral arteries were exposed and controlled in standard fashion.  The crossing circumflex femoral vein was ligated which gave access to the retroperitoneum.  Next, our attention turned to the abdomen.  Midline laparotomy wound was made incorporating the patient's previous infraumbilical scar.  This was carried through the fascia to the peritoneum.  Upon entering  the peritoneum there were dense adhesions appreciated.  We spent over 30 minutes lysing adhesions.  The inferior mesenteric vein was ligated.  Due to the dense adhesions, we elected to work laterally.  Fortunately, with the patient's small size, we were able to mobilize the left colon and expose the aorta and retroperitoneal plane through a modified left to right medial visceral rotation. It appeared as though the splenic flexure had already been mobilized from her previous surgery. Upon exposure of the aorta, bilateral renal arteries and left renal vein was identified.  A soft spot was found for clamping proximally, distally, there was significant calcific disease.  Next, our attention turned to creating tunnel tracts for the bifurcated graft.  Blunt dissection was used from the femoral incisions as well as the common iliac arteries bilaterally to create a tract on the anterior surface of the external iliac arteries.  This proved difficult on the left side due to significant scar tissue.  Greater than 30 minutes was spent creating, and dilating a tract sufficient enough for 7 mm limb without disrupting or injuring the left ureter.  Umbo tapes were used bilaterally to hold the course of the retroperitoneal tracks.  The patient was given 5000 units of IV heparin and ACT was found to be greater than 250.  At this time, a Harkins C-clamp was used to control the infrarenal abdominal aorta, with an aortic clamp distally.  Once clamped, the aorta was transected and distal aspect ligated using running 3-0 Prolene suture.  Next, my attention turned to the proximal aspect and aortic endarterectomy followed was necessary to create  a sewable portion of tissue.  The proximal anastomosis was sewn using 3-0 Prolene in running fashion with felt.  Completion of anastomosis, several pledgets were needed on the anterior aspect of the aorta due to a small, longitudinal tear.  This was controlled and did not cause any issues for the  remainder of the case.  Next, attention turned to bilateral lower extremities.  The graft was bled to ensure there was no debris in either limb.  Next limbs were tunneled along the previously made retroperitoneal planes.  Bilateral common femoral arteries were then controlled and an 11 blade was used to make a longitudinal arteriotomy.  This was followed by endarterectomy.  The dacryon limbs were cut to length and sewn end-to-side in running fashion using 5-0 Prolene suture.  Both were backbled prior to completing the anastomosis to ensure there was no debris or thrombus in the limbs.  Limbs were unclamped individually to ensure the patient did not have a major drop in her blood pressure.  She tolerated this well. Doppler was brought up demonstrating appropriate signals proximally and distally to the new anastomosis with signals appreciated in both feet.  At this point, 50 mg of protamine were given to reverse the effects of heparin.  We gave the abdomen a second look, and bleeding was appreciated from the previously ligated distal portion of the abdominal aorta.  This was reinforced with felt pledgets, and there was cessation of bleeding. The IMA was not visualized on CT (possibly ligated during previous surgery) and therefore it was not assessed intraop. Excellent clot was appreciated.  All 3 anastomoses were examined and tested demonstrating no bleeding.  Being that we performed a left to right medial visceral rotation, there was no retroperitoneum to repair over the aortobifemoral graft as we were already in the retroperitoneal plane.  The peritoneum was in place rolled over the new bypass graft, placing the left colon in anatomic position.  Groins were irrigated and closed using layers of 2-0 Vicryl suture with 4-0 Monocryl in the skin.  The abdomen was closed using #1 looped PDS, with 4-0 Monocryl, and Dermabond at the skin.  At cessation of the case, the patient had signals in bilateral lower  extremities.  Abdomen was soft.  She was taken to PACU in stable condition.  Given the complexity of the case a co-surgeon was necessary in order to expedient the procedure and safely perform the technical aspects of the operation.  Macie Burows, MD [facility]  DATE OF DICTATION:   12/03/2021

## 2021-12-04 ENCOUNTER — Inpatient Hospital Stay (HOSPITAL_COMMUNITY): Payer: Medicare Other

## 2021-12-04 ENCOUNTER — Encounter (HOSPITAL_COMMUNITY): Payer: Self-pay | Admitting: Vascular Surgery

## 2021-12-04 DIAGNOSIS — Z95828 Presence of other vascular implants and grafts: Secondary | ICD-10-CM

## 2021-12-04 DIAGNOSIS — Z48812 Encounter for surgical aftercare following surgery on the circulatory system: Secondary | ICD-10-CM

## 2021-12-04 LAB — CBC
HCT: 25.5 % — ABNORMAL LOW (ref 36.0–46.0)
HCT: 24.4 % — ABNORMAL LOW (ref 36.0–46.0)
HCT: 23.1 % — ABNORMAL LOW (ref 36.0–46.0)
HCT: 20.9 % — ABNORMAL LOW (ref 36.0–46.0)
Hemoglobin: 8.7 g/dL — ABNORMAL LOW (ref 12.0–15.0)
Hemoglobin: 7.9 g/dL — ABNORMAL LOW (ref 12.0–15.0)
Hemoglobin: 7.6 g/dL — ABNORMAL LOW (ref 12.0–15.0)
Hemoglobin: 6.8 g/dL — CL (ref 12.0–15.0)
MCH: 32.3 pg (ref 26.0–34.0)
MCH: 30.9 pg (ref 26.0–34.0)
MCH: 31.5 pg (ref 26.0–34.0)
MCH: 31.3 pg (ref 26.0–34.0)
MCHC: 34.1 g/dL (ref 30.0–36.0)
MCHC: 32.4 g/dL (ref 30.0–36.0)
MCHC: 32.9 g/dL (ref 30.0–36.0)
MCHC: 32.5 g/dL (ref 30.0–36.0)
MCV: 94.8 fL (ref 80.0–100.0)
MCV: 95.3 fL (ref 80.0–100.0)
MCV: 95.9 fL (ref 80.0–100.0)
MCV: 96.3 fL (ref 80.0–100.0)
Platelets: 182 10*3/uL (ref 150–400)
Platelets: 185 10*3/uL (ref 150–400)
Platelets: 150 10*3/uL (ref 150–400)
Platelets: 145 10*3/uL — ABNORMAL LOW (ref 150–400)
RBC: 2.69 MIL/uL — ABNORMAL LOW (ref 3.87–5.11)
RBC: 2.56 MIL/uL — ABNORMAL LOW (ref 3.87–5.11)
RBC: 2.41 MIL/uL — ABNORMAL LOW (ref 3.87–5.11)
RBC: 2.17 MIL/uL — ABNORMAL LOW (ref 3.87–5.11)
RDW: 12.9 % (ref 11.5–15.5)
RDW: 12.9 % (ref 11.5–15.5)
RDW: 13.2 % (ref 11.5–15.5)
RDW: 13 % (ref 11.5–15.5)
WBC: 11.9 10*3/uL — ABNORMAL HIGH (ref 4.0–10.5)
WBC: 10.8 10*3/uL — ABNORMAL HIGH (ref 4.0–10.5)
WBC: 9.9 10*3/uL (ref 4.0–10.5)
WBC: 10 10*3/uL (ref 4.0–10.5)
nRBC: 0 % (ref 0.0–0.2)
nRBC: 0 % (ref 0.0–0.2)
nRBC: 0 % (ref 0.0–0.2)
nRBC: 0 % (ref 0.0–0.2)

## 2021-12-04 LAB — COMPREHENSIVE METABOLIC PANEL
ALT: 14 U/L (ref 0–44)
ALT: 13 U/L (ref 0–44)
ALT: 12 U/L (ref 0–44)
ALT: 12 U/L (ref 0–44)
AST: 36 U/L (ref 15–41)
AST: 33 U/L (ref 15–41)
AST: 36 U/L (ref 15–41)
AST: 34 U/L (ref 15–41)
Albumin: 2.9 g/dL — ABNORMAL LOW (ref 3.5–5.0)
Albumin: 2.9 g/dL — ABNORMAL LOW (ref 3.5–5.0)
Albumin: 2.8 g/dL — ABNORMAL LOW (ref 3.5–5.0)
Albumin: 2.6 g/dL — ABNORMAL LOW (ref 3.5–5.0)
Alkaline Phosphatase: 36 U/L — ABNORMAL LOW (ref 38–126)
Alkaline Phosphatase: 38 U/L (ref 38–126)
Alkaline Phosphatase: 36 U/L — ABNORMAL LOW (ref 38–126)
Alkaline Phosphatase: 38 U/L (ref 38–126)
Anion gap: 10 (ref 5–15)
Anion gap: 8 (ref 5–15)
Anion gap: 9 (ref 5–15)
Anion gap: 8 (ref 5–15)
BUN: 13 mg/dL (ref 8–23)
BUN: 15 mg/dL (ref 8–23)
BUN: 17 mg/dL (ref 8–23)
BUN: 18 mg/dL (ref 8–23)
CO2: 24 mmol/L (ref 22–32)
CO2: 26 mmol/L (ref 22–32)
CO2: 26 mmol/L (ref 22–32)
CO2: 27 mmol/L (ref 22–32)
Calcium: 7.6 mg/dL — ABNORMAL LOW (ref 8.9–10.3)
Calcium: 7.5 mg/dL — ABNORMAL LOW (ref 8.9–10.3)
Calcium: 7.5 mg/dL — ABNORMAL LOW (ref 8.9–10.3)
Calcium: 7.2 mg/dL — ABNORMAL LOW (ref 8.9–10.3)
Chloride: 102 mmol/L (ref 98–111)
Chloride: 103 mmol/L (ref 98–111)
Chloride: 102 mmol/L (ref 98–111)
Chloride: 99 mmol/L (ref 98–111)
Creatinine, Ser: 0.92 mg/dL (ref 0.44–1.00)
Creatinine, Ser: 0.86 mg/dL (ref 0.44–1.00)
Creatinine, Ser: 1.07 mg/dL — ABNORMAL HIGH (ref 0.44–1.00)
Creatinine, Ser: 0.83 mg/dL (ref 0.44–1.00)
GFR, Estimated: >60 mL/min (ref >60)
GFR, Estimated: >60 mL/min (ref >60)
GFR, Estimated: 52 mL/min — ABNORMAL LOW (ref >60)
GFR, Estimated: >60 mL/min (ref >60)
Glucose, Bld: 152 mg/dL — ABNORMAL HIGH (ref 70–99)
Glucose, Bld: 147 mg/dL — ABNORMAL HIGH (ref 70–99)
Glucose, Bld: 134 mg/dL — ABNORMAL HIGH (ref 70–99)
Glucose, Bld: 128 mg/dL — ABNORMAL HIGH (ref 70–99)
Potassium: 4.1 mmol/L (ref 3.5–5.1)
Potassium: 4 mmol/L (ref 3.5–5.1)
Potassium: 3.8 mmol/L (ref 3.5–5.1)
Potassium: 3.6 mmol/L (ref 3.5–5.1)
Sodium: 136 mmol/L (ref 135–145)
Sodium: 137 mmol/L (ref 135–145)
Sodium: 137 mmol/L (ref 135–145)
Sodium: 134 mmol/L — ABNORMAL LOW (ref 135–145)
Total Bilirubin: 0.5 mg/dL (ref 0.3–1.2)
Total Bilirubin: 0.5 mg/dL (ref 0.3–1.2)
Total Bilirubin: 0.7 mg/dL (ref 0.3–1.2)
Total Bilirubin: 0.6 mg/dL (ref 0.3–1.2)
Total Protein: 5 g/dL — ABNORMAL LOW (ref 6.5–8.1)
Total Protein: 5.1 g/dL — ABNORMAL LOW (ref 6.5–8.1)
Total Protein: 5 g/dL — ABNORMAL LOW (ref 6.5–8.1)
Total Protein: 4.6 g/dL — ABNORMAL LOW (ref 6.5–8.1)

## 2021-12-04 LAB — AMYLASE: Amylase: 56 U/L (ref 28–100)

## 2021-12-04 LAB — PREPARE RBC (CROSSMATCH)

## 2021-12-04 LAB — MAGNESIUM: Magnesium: 1.4 mg/dL — ABNORMAL LOW (ref 1.7–2.4)

## 2021-12-04 MED ORDER — SODIUM CHLORIDE 0.9% IV SOLUTION: Freq: Once | INTRAVENOUS | Status: AC

## 2021-12-04 MED ORDER — KETOROLAC TROMETHAMINE 15 MG/ML IJ SOLN
15.0000 mg | Freq: Four times a day (QID) | INTRAMUSCULAR | Status: DC
  Administered 2021-12-04 – 2021-12-05 (×5): 15 mg via INTRAVENOUS
  Filled 2021-12-04 (×5): qty 1

## 2021-12-04 MED ORDER — POTASSIUM CHLORIDE 10 MEQ/100ML IV SOLN
10.0000 meq | INTRAVENOUS | Status: AC
  Administered 2021-12-04 – 2021-12-05 (×2): 10 meq via INTRAVENOUS
  Filled 2021-12-04 (×2): qty 100

## 2021-12-04 MED ORDER — PANTOPRAZOLE SODIUM 40 MG IV SOLR
40.0000 mg | Freq: Two times a day (BID) | INTRAVENOUS | Status: DC
  Administered 2021-12-04 – 2021-12-07 (×6): 40 mg via INTRAVENOUS
  Filled 2021-12-04 (×6): qty 40

## 2021-12-04 NOTE — Evaluation (Signed)
Occupational Therapy Evaluation Patient Details Name: Elizabeth [name] MRN: [id number] DOB: 1941-05-15 Today's Date: 12/04/2021   History of Present Illness Pt is an 81 y.o. female who presented 12/03/21 for elective aortobifemoral bypass, aortic endarterectomy, and bilateral common femoral artery endarterectomy secondary to critical bil lower extremity ischemia. PMH: CAD, COPD, GERD, HTN, heart murmur, osteopenia, PVD   Clinical Impression   PT admitted with aortbifemoral bypass surgery. Pt currently with functional limitiations due to the deficits listed below (see OT problem list). Pt progressed to chair level this session with total+2 min (A). Pt will need (A) for LB dressing at this time. All incision sites dry.  Pt will benefit from skilled OT to increase their independence and safety with adls and balance to allow discharge [place].       Recommendations for follow up therapy are one component of a multi-disciplinary discharge planning process, led by the attending physician.  Recommendations may be updated based on patient status, additional functional criteria and insurance authorization.   Follow Up Recommendations  Home health OT    Assistance Recommended at Discharge Set up Supervision/Assistance  Patient can return home with the following      Functional Status Assessment  Patient has had a recent decline in their functional status and demonstrates the ability to make significant improvements in function in a reasonable and predictable amount of time.  Equipment Recommendations  BSC/3in1    Recommendations for Other Services       Precautions / Restrictions Precautions Precautions: Fall;Other (comment) Precaution Comments: NG tube, aline, foley Restrictions Weight Bearing Restrictions: No      Mobility Bed Mobility Overal bed mobility: Needs Assistance Bed Mobility: Supine to Sit     Supine to sit: +2 for physical assistance, +2 for safety/equipment, Mod assist      General bed mobility comments: helicopter to EOB on Right side sitting.    Transfers                          Balance Overall balance assessment: Needs assistance Sitting-balance support: No upper extremity supported, Feet supported Sitting balance-Leahy Scale: Fair                                     ADL either performed or assessed with clinical judgement   ADL Overall ADL's : Needs assistance/impaired Eating/Feeding: Set up Eating/Feeding Details (indicate cue type and reason): requesting a coke and nab crackers Grooming: Minimal assistance   Upper Body Bathing: Minimal assistance           Lower Body Dressing: Maximal assistance Lower Body Dressing Details (indicate cue type and reason): don socks Toilet Transfer: Minimal assistance;+2 for physical assistance;+2 for safety/equipment                   Vision Baseline Vision/History: 1 Wears glasses (objects far away or driving wears glasses)       Perception     Praxis      Pertinent Vitals/Pain Pain Assessment Pain Assessment: Faces Faces Pain Scale: Hurts little more Pain Location: abdomen and groining Pain Descriptors / Indicators: Discomfort, Operative site guarding Pain Intervention(s): Monitored during session, Premedicated before session, Repositioned     Hand Dominance Right   Extremity/Trunk Assessment Upper Extremity Assessment Upper Extremity Assessment: Defer to OT evaluation   Lower Extremity Assessment Lower Extremity Assessment: RLE deficits/detail;LLE deficits/detail RLE Deficits /  Details: Cold to touch compared to L being warm to touch; reports better sensation to touch in R leg compared to L; MMT scores of 4 to 4+ grossly LLE Deficits / Details: Warm to touch with edema noted compared to R, with R being cold to touch; reports less sensation to touch in L leg compared to R; MMT scores of 4 to 4+ grossly   Cervical / Trunk Assessment Cervical / Trunk  Assessment: Kyphotic   Communication Communication Communication: Other (comment) (slightly slurred without dentures)   Cognition Arousal/Alertness: Lethargic Behavior During Therapy: WFL for tasks assessed/performed Overall Cognitive Status: Within Functional Limits for tasks assessed                                 General Comments: joking and very sharp wit remarks such as "i have 3 great grandchildren that are my reachers" when asked about AE at home. Pt appears to be [facility] for all assessment     General Comments  On 1L O2 via [place] upon arrival, removed for session with VSS on RA throughout, re-donned [place] at end of session as pt lethargic    Exercises     Shoulder Instructions      Home Living Family/patient expects to be discharged to:: Private residence Living Arrangements: Alone Available Help at Discharge: Available PRN/intermittently;Family Type of Home: House Home Access: Level entry     Home Layout: One level     Bathroom Shower/Tub: Occupational psychologist: Handicapped height     Home Equipment: Conservation officer, nature (2 wheels);Cane - single point;Shower seat;Grab bars - toilet;Grab bars - tub/shower;Hand held shower head;Wheelchair - manual [employer], also reports she has 3 grandchildren to be her reacher)          Prior Functioning/Environment Prior Level of Function : Independent/Modified Independent;Working/employed;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)                        OT Problem List: Decreased activity tolerance;Impaired balance (sitting and/or standing);Decreased safety awareness;Decreased knowledge of use of DME or AE;Decreased knowledge of precautions;Cardiopulmonary status limiting activity      OT Treatment/Interventions: Self-care/ADL training;DME and/or AE instruction;Energy conservation;Therapeutic activities;Patient/family education;Balance training;Therapeutic exercise    OT  Goals(Current goals can be found in the care plan section) Acute Rehab OT Goals Patient Stated Goal: motivated to get back to work OT Goal Formulation: With patient Time For Goal Achievement: 12/18/21 Potential to Achieve Goals: Good  OT Frequency: Min 2X/week    Co-evaluation PT/OT/SLP Co-Evaluation/Treatment: Yes Reason for Co-Treatment: Necessary to address cognition/behavior during functional activity;For patient/therapist safety;To address functional/ADL transfers PT goals addressed during session: Mobility/safety with mobility;Balance OT goals addressed during session: ADL's and self-care;Proper use of Adaptive equipment and DME;Strengthening/ROM      AM-PAC OT "6 Clicks" Daily Activity     Outcome Measure Help from another person eating meals?: A Little Help from another person taking care of personal grooming?: A Little Help from another person toileting, which includes using toliet, bedpan, or urinal?: A Lot Help from another person bathing (including washing, rinsing, drying)?: A Lot Help from another person to put on and taking off regular upper body clothing?: A Little Help from another person to put on and taking off regular lower body clothing?: A Lot 6 Click Score: 15   End of Session Nurse Communication: Mobility status;Precautions  Activity Tolerance:  Patient tolerated treatment well Patient left: in chair;with call bell/phone within reach;with chair alarm set;with family/visitor present  OT Visit Diagnosis: Unsteadiness on feet (R26.81);Muscle weakness (generalized) (M62.81)                Time: 7998-7215 OT Time Calculation (min): 23 min Charges:  OT General Charges $OT Visit: 1 Visit OT Evaluation $OT Eval Moderate Complexity: 1 Mod   Brynn, OTR/L  Acute Rehabilitation Services Pager: [phone] Office: [phone] .   Jeri Modena 12/04/2021, 12:39 PM

## 2021-12-04 NOTE — Progress Notes (Addendum)
°  Progress Note    12/04/2021 7:31 AM 1 Day Post-Op  Subjective:  says she just doesn't feel well, feels " drugged", somewhat confused   Vitals:   12/04/21 0500 12/04/21 0600  BP: (!) 121/44 (!) 123/46  Pulse: (!) 111 (!) 107  Resp: 15 15  Temp:    SpO2: 97% 100%   Physical Exam: Cardiac:  tachy Lungs:  non labored, RA Incisions:  abdominal incision and b groin incisions c/d/I. Mild ecchymosis and small hematoma in left groin, soft Extremities:  well perfused and warm. Doppler right PT/ Doppler left AT/Pt signals. Motor and sensation intact. 2+ radial pulses bilaterally Abdomen:  soft, expected tenderness Neurologic: alert  CBC    Component Value Date/Time   WBC 11.9 (H) 12/04/2021 0235   RBC 2.69 (L) 12/04/2021 0235   HGB 8.7 (L) 12/04/2021 0235   HCT 25.5 (L) 12/04/2021 0235   PLT 182 12/04/2021 0235   MCV 94.8 12/04/2021 0235   MCH 32.3 12/04/2021 0235   MCHC 34.1 12/04/2021 0235   RDW 12.9 12/04/2021 0235   LYMPHSABS 1.1 04/04/2021 1658   MONOABS 1.0 04/04/2021 1658   EOSABS 0.0 04/04/2021 1658   BASOSABS 0.0 04/04/2021 1658    BMET    Component Value Date/Time   NA 136 12/04/2021 0235   K 4.1 12/04/2021 0235   CL 102 12/04/2021 0235   CO2 24 12/04/2021 0235   GLUCOSE 152 (H) 12/04/2021 0235   BUN 13 12/04/2021 0235   CREATININE 0.92 12/04/2021 0235   CALCIUM 7.6 (L) 12/04/2021 0235   GFRNONAA >60 12/04/2021 0235   GFRAA >60 04/11/2020 1053    INR    Component Value Date/Time   INR 1.3 (H) 12/03/2021 1150     Intake/Output Summary (Last 24 hours) at 12/04/2021 0731 Last data filed at 12/04/2021 0500 Gross per 24 hour  Intake 4685.79 ml  Output 1455 ml  Net 3230.79 ml     Assessment/Plan:  81 y.o. female is s/p  Aortobifemoral bypass Aortic endarterectomy Greater than 30 minutes lysis of adhesions Ligation of large vessel-inferior mesenteric vein Bilateral common femoral artery endarterectomy 1 Day Post-Op   General: Feels confused. Will  back off Narcotics. Added Toradol for today to see if this helps with pain control Incisions: intact and well appearing, small hematoma in left groin, soft CV: tachy, hemodynamically stable. Add Aspirin Pulm: non labored. Encourage IS GI: Keep NG. 125 cc output. NPO. Not passing flatus yet. No BM GU: good UOP. Decreased Plasma-Lyte. Renal function stable. Scr 0.92 Heme: Hemodynamically stable. Hgb down a little 8.7. No need for transfusion now ID: no evidence of infection. afebrile Endo: BG goal 120-180 Prophylaxis:  sq hep/SCDs  Start Mobilizing today PT/OT   Karoline Caldwell, [place] [facility] [phone] 12/04/2021 7:31 AM  VASCULAR STAFF ADDENDUM: I have independently interviewed and examined the patient. I agree with the above.  Some confusion this morning, but able to answer questions Physical exam, soft abdomen, soft groins Excellent signals distally NGT 125 - gastric UOP 65ml/hr - Cr normal  Progressing appropriately Will work on multimodal pain control OOB to chair Continue foley, NGT OOB with PT and OT Labs can be spaced to q12h Rectal ASA - low dose   [name], MD [facility] Phone Number: [phone] 12/04/2021 7:49 AM

## 2021-12-04 NOTE — Evaluation (Signed)
Physical Therapy Evaluation Patient Details Name: Elizabeth [name] MRN: [id number] DOB: October 29, 1941 Today's Date: 12/04/2021  History of Present Illness  Pt is an 81 y.o. female who presented 12/03/21 for elective aortobifemoral bypass, aortic endarterectomy, and bilateral common femoral artery endarterectomy secondary to critical bil lower extremity ischemia. PMH: CAD, COPD, GERD, HTN, heart murmur, osteopenia, PVD   Clinical Impression  Pt presents with condition above and deficits mentioned below, see PT Problem List. 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 Currently, pt is requiring modAx2 for supine > sit transitions and minAx2 for transfers to stand and step bed > recliner with HHA. Pt mainly limited this date by lethargy, VSS on RA. Suspect pt will progress quickly as her arousal level improves. She does displays deficits in bil lower extremity sensation (L more numb than R and L with more edema and warmer to palpation than R, notified RN), balance, and mild lower extremity weakness. At this time, recommending pt receive intermittent assistance from family initially upon d/c along with HHPT follow-up to maximize her return to baseline. However, if pt progresses quickly she may not require any PT follow-up at d/c. Will continue to follow acutely.       Recommendations for follow up therapy are one component of a multi-disciplinary discharge planning process, led by the attending physician.  Recommendations may be updated based on patient status, additional functional criteria and insurance authorization.  Follow Up Recommendations Home health PT (pending progression may not need any follow-up PT)    Assistance Recommended at Discharge Intermittent Supervision/Assistance  Patient can return home with the following  A little help with walking and/or transfers;A little help with bathing/dressing/bathroom;Assist for  transportation;Help with stairs or ramp for entrance    Equipment Recommendations None recommended by PT  Recommendations for Other Services       Functional Status Assessment Patient has had a recent decline in their functional status and demonstrates the ability to make significant improvements in function in a reasonable and predictable amount of time.     Precautions / Restrictions Precautions Precautions: Fall;Other (comment) Precaution Comments: NG tube, aline, foley Restrictions Weight Bearing Restrictions: No      Mobility  Bed Mobility Overal bed mobility: Needs Assistance Bed Mobility: Supine to Sit     Supine to sit: +2 for safety/equipment, +2 for physical assistance, HOB elevated, Mod assist     General bed mobility comments: Cues provided to bring legs off R EOB. HOB elevated, using bed pad to hellicopter pt up to sit R EOB to reduce pain in groins at surgical site and reduce pressure on R UE due to Aline.    Transfers Overall transfer level: Needs assistance Equipment used: 2 person hand held assist Transfers: Sit to/from Stand, Bed to chair/wheelchair/BSC Sit to Stand: Min assist, +2 physical assistance, +2 safety/equipment   Step pivot transfers: Min assist, +2 physical assistance, +2 safety/equipment       General transfer comment: MinAx2 with bil HHA to power up to stand from EOB and step to R to transfer bed > recliner. Pt letahrgic, thus limited to transferring between surfaces.    Ambulation/Gait Ambulation/Gait assistance: Min assist, +2 physical assistance, +2 safety/equipment Gait Distance (Feet): 3 Feet Assistive device: 2 person hand held assist Gait Pattern/deviations: Step-through pattern, Decreased stride length, Trunk flexed Gait velocity: reduced Gait velocity interpretation: <1.31 ft/sec, indicative of household ambulator   General Gait Details:  Pt with small, slow steps bed > recliner to her R with bil HHA minAx2 for support.  Limited in distance by pt lethargy.  Stairs            Wheelchair Mobility    Modified Rankin (Stroke Patients Only)       Balance Overall balance assessment: Needs assistance Sitting-balance support: No upper extremity supported, Feet supported Sitting balance-Leahy Scale: Fair Sitting balance - Comments: Static sitting EOB with min guard-minA.   Standing balance support: Bilateral upper extremity supported, During functional activity Standing balance-Leahy Scale: Poor Standing balance comment: Reliant on UE support                             Pertinent Vitals/Pain Pain Assessment Pain Assessment: Faces Faces Pain Scale: Hurts little more Pain Location: abdomen, groin incision sites when coughing Pain Descriptors / Indicators: Discomfort, Grimacing, Operative site guarding Pain Intervention(s): Monitored during session, Limited activity within patient's tolerance, Repositioned, Other (comment) (educated pt to self-splint using pillows)    Home Living Family/patient expects to be discharged to:: Private residence Living Arrangements: Alone Available Help at Discharge: Available PRN/intermittently;Family Type of Home: House Home Access: Level entry       Home Layout: One level Home Equipment: Conservation officer, nature (2 wheels);Cane - single point;Shower seat;Grab bars - toilet;Grab bars - tub/shower;Hand held shower head;Wheelchair - manual [employer], also reports she has 3 grandchildren to be her reacher)      Prior Function Prior Level of Function : Independent/Modified Independent;Working/employed;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)                     [place]: Right    Extremity/Trunk Assessment   Upper Extremity Assessment Upper Extremity Assessment: Defer to OT evaluation    Lower Extremity Assessment Lower Extremity Assessment: RLE deficits/detail;LLE deficits/detail RLE Deficits /  Details: Cold to touch compared to L being warm to touch; reports better sensation to touch in R leg compared to L; MMT scores of 4 to 4+ grossly LLE Deficits / Details: Warm to touch with edema noted compared to R, with R being cold to touch; reports less sensation to touch in L leg compared to R; MMT scores of 4 to 4+ grossly    Cervical / Trunk Assessment Cervical / Trunk Assessment: Kyphotic  Communication   Communication: Other (comment) (slightly slurred without dentures)  Cognition Arousal/Alertness: Lethargic Behavior During Therapy: WFL for tasks assessed/performed Overall Cognitive Status: Within Functional Limits for tasks assessed                                 General Comments: PT A&Ox4. Lethargic, but follows commands appropriately with extra time. Suspect would be [facility] cognitively if less lethargic. Will plan to continue to assess in future sessions.        General Comments General comments (skin integrity, edema, etc.): On 1L O2 via [place] upon arrival, removed for session with VSS on RA throughout, re-donned  at end of session as pt lethargic    Exercises     Assessment/Plan    PT Assessment Patient needs continued PT services  PT Problem List Decreased strength;Decreased activity tolerance;Decreased balance;Decreased coordination;Decreased mobility;Impaired sensation;Pain       PT Treatment Interventions DME instruction;Gait training;Functional mobility training;Therapeutic activities;Therapeutic exercise;Balance training;Neuromuscular re-education;Patient/family education    PT  Goals (Current goals can be found in the Care Plan section)  Acute Rehab PT Goals Patient Stated Goal: to get better PT Goal Formulation: With patient/family Time For Goal Achievement: 12/18/21 Potential to Achieve Goals: Good    Frequency Min 3X/week     Co-evaluation PT/OT/SLP Co-Evaluation/Treatment: Yes Reason for Co-Treatment: Necessary to address  cognition/behavior during functional activity;For patient/therapist safety;To address functional/ADL transfers PT goals addressed during session: Mobility/safety with mobility;Balance         AM-PAC PT "6 Clicks" Mobility  Outcome Measure Help needed turning from your back to your side while in a flat bed without using bedrails?: A Little Help needed moving from lying on your back to sitting on the side of a flat bed without using bedrails?: A Lot Help needed moving to and from a bed to a chair (including a wheelchair)?: A Lot Help needed standing up from a chair using your arms (e.g., wheelchair or bedside chair)?: A Lot Help needed to walk in hospital room?: Total Help needed climbing 3-5 steps with a railing? : Total 6 Click Score: 11    End of Session Equipment Utilized During Treatment: Oxygen Activity Tolerance: Patient tolerated treatment well Patient left: in chair;with call bell/phone within reach;with chair alarm set;with family/visitor present Nurse Communication: Mobility status;Other (comment) (vitals) PT Visit Diagnosis: Unsteadiness on feet (R26.81);Other abnormalities of gait and mobility (R26.89);Muscle weakness (generalized) (M62.81);Difficulty in walking, not elsewhere classified (R26.2);Pain Pain - Right/Left:  (bil) Pain - part of body: Hip    Time: 4431-5400 PT Time Calculation (min) (ACUTE ONLY): 25 min   Charges:   PT Evaluation $PT Eval Moderate Complexity: 1 Mod          Moishe Spice, PT, DPT Acute Rehabilitation Services  Pager: [phone] Office: [phone]   Orvan Falconer 12/04/2021, 11:23 AM

## 2021-12-05 DIAGNOSIS — D62 Acute posthemorrhagic anemia: Secondary | ICD-10-CM

## 2021-12-05 LAB — COMPREHENSIVE METABOLIC PANEL
ALT: 13 U/L (ref 0–44)
ALT: 16 U/L (ref 0–44)
AST: 41 U/L (ref 15–41)
AST: 53 U/L — ABNORMAL HIGH (ref 15–41)
Albumin: 2.6 g/dL — ABNORMAL LOW (ref 3.5–5.0)
Albumin: 2.6 g/dL — ABNORMAL LOW (ref 3.5–5.0)
Alkaline Phosphatase: 39 U/L (ref 38–126)
Alkaline Phosphatase: 41 U/L (ref 38–126)
Anion gap: 8 (ref 5–15)
Anion gap: 7 (ref 5–15)
BUN: 18 mg/dL (ref 8–23)
BUN: 17 mg/dL (ref 8–23)
CO2: 28 mmol/L (ref 22–32)
CO2: 27 mmol/L (ref 22–32)
Calcium: 7.4 mg/dL — ABNORMAL LOW (ref 8.9–10.3)
Calcium: 7.5 mg/dL — ABNORMAL LOW (ref 8.9–10.3)
Chloride: 102 mmol/L (ref 98–111)
Chloride: 104 mmol/L (ref 98–111)
Creatinine, Ser: 0.79 mg/dL (ref 0.44–1.00)
Creatinine, Ser: 0.75 mg/dL (ref 0.44–1.00)
GFR, Estimated: >60 mL/min (ref >60)
GFR, Estimated: >60 mL/min (ref >60)
Glucose, Bld: 128 mg/dL — ABNORMAL HIGH (ref 70–99)
Glucose, Bld: 114 mg/dL — ABNORMAL HIGH (ref 70–99)
Potassium: 4 mmol/L (ref 3.5–5.1)
Potassium: 3.8 mmol/L (ref 3.5–5.1)
Sodium: 138 mmol/L (ref 135–145)
Sodium: 138 mmol/L (ref 135–145)
Total Bilirubin: 1 mg/dL (ref 0.3–1.2)
Total Bilirubin: 0.9 mg/dL (ref 0.3–1.2)
Total Protein: 5 g/dL — ABNORMAL LOW (ref 6.5–8.1)
Total Protein: 5.1 g/dL — ABNORMAL LOW (ref 6.5–8.1)

## 2021-12-05 LAB — BASIC METABOLIC PANEL
Anion gap: 8 (ref 5–15)
BUN: 16 mg/dL (ref 8–23)
CO2: 28 mmol/L (ref 22–32)
Calcium: 7.4 mg/dL — ABNORMAL LOW (ref 8.9–10.3)
Chloride: 104 mmol/L (ref 98–111)
Creatinine, Ser: 0.83 mg/dL (ref 0.44–1.00)
GFR, Estimated: >60 mL/min (ref >60)
Glucose, Bld: 111 mg/dL — ABNORMAL HIGH (ref 70–99)
Potassium: 3.6 mmol/L (ref 3.5–5.1)
Sodium: 140 mmol/L (ref 135–145)

## 2021-12-05 LAB — CBC
HCT: 29.6 % — ABNORMAL LOW (ref 36.0–46.0)
HCT: 31.2 % — ABNORMAL LOW (ref 36.0–46.0)
HCT: 31.6 % — ABNORMAL LOW (ref 36.0–46.0)
Hemoglobin: 10 g/dL — ABNORMAL LOW (ref 12.0–15.0)
Hemoglobin: 11 g/dL — ABNORMAL LOW (ref 12.0–15.0)
Hemoglobin: 11.2 g/dL — ABNORMAL LOW (ref 12.0–15.0)
MCH: 30.8 pg (ref 26.0–34.0)
MCH: 31.9 pg (ref 26.0–34.0)
MCH: 31.8 pg (ref 26.0–34.0)
MCHC: 33.8 g/dL (ref 30.0–36.0)
MCHC: 35.3 g/dL (ref 30.0–36.0)
MCHC: 35.4 g/dL (ref 30.0–36.0)
MCV: 91.1 fL (ref 80.0–100.0)
MCV: 90.4 fL (ref 80.0–100.0)
MCV: 89.8 fL (ref 80.0–100.0)
Platelets: 124 10*3/uL — ABNORMAL LOW (ref 150–400)
Platelets: 121 10*3/uL — ABNORMAL LOW (ref 150–400)
Platelets: 130 10*3/uL — ABNORMAL LOW (ref 150–400)
RBC: 3.25 MIL/uL — ABNORMAL LOW (ref 3.87–5.11)
RBC: 3.45 MIL/uL — ABNORMAL LOW (ref 3.87–5.11)
RBC: 3.52 MIL/uL — ABNORMAL LOW (ref 3.87–5.11)
RDW: 14.1 % (ref 11.5–15.5)
RDW: 14.6 % (ref 11.5–15.5)
RDW: 14.7 % (ref 11.5–15.5)
WBC: 9.9 10*3/uL (ref 4.0–10.5)
WBC: 12.2 10*3/uL — ABNORMAL HIGH (ref 4.0–10.5)
WBC: 11.1 10*3/uL — ABNORMAL HIGH (ref 4.0–10.5)
nRBC: 0 % (ref 0.0–0.2)
nRBC: 0 % (ref 0.0–0.2)
nRBC: 0 % (ref 0.0–0.2)

## 2021-12-05 MED ORDER — ACETAMINOPHEN 325 MG PO TABS: 650.0000 mg | ORAL_TABLET | Freq: Four times a day (QID) | ORAL | Status: DC

## 2021-12-05 MED ORDER — ASPIRIN 81 MG PO CHEW
81.0000 mg | CHEWABLE_TABLET | Freq: Every day | ORAL | Status: DC
  Administered 2021-12-05: 81 mg
  Filled 2021-12-05: qty 1

## 2021-12-05 MED ORDER — GABAPENTIN 100 MG PO CAPS: 200.0000 mg | ORAL_CAPSULE | Freq: Every day | ORAL | Status: DC

## 2021-12-05 MED ORDER — FAMOTIDINE 20 MG PO TABS: 20.0000 mg | ORAL_TABLET | Freq: Every day | ORAL | Status: DC | PRN

## 2021-12-05 MED ORDER — B COMPLEX-C PO TABS
1.0000 | ORAL_TABLET | Freq: Every day | ORAL | Status: DC
  Administered 2021-12-05 – 2021-12-06 (×2): 1
  Filled 2021-12-05 (×2): qty 1

## 2021-12-05 MED ORDER — METOPROLOL SUCCINATE ER 25 MG PO TB24: 25.0000 mg | ORAL_TABLET | Freq: Every day | ORAL | Status: DC

## 2021-12-05 MED ORDER — MELOXICAM 7.5 MG PO TABS
15.0000 mg | ORAL_TABLET | Freq: Every day | ORAL | Status: DC
  Administered 2021-12-05: 15 mg
  Filled 2021-12-05 (×2): qty 2

## 2021-12-05 MED ORDER — ACETAMINOPHEN 160 MG/5ML PO SOLN
650.0000 mg | Freq: Four times a day (QID) | ORAL | Status: DC
  Administered 2021-12-05 – 2021-12-06 (×6): 650 mg
  Filled 2021-12-05 (×6): qty 20.3

## 2021-12-05 MED ORDER — POTASSIUM CHLORIDE 20 MEQ PO PACK: 20.0000 meq | PACK | Freq: Every day | ORAL | Status: DC | PRN

## 2021-12-05 MED ORDER — HYDROCHLOROTHIAZIDE 12.5 MG PO TABS
12.5000 mg | ORAL_TABLET | Freq: Every day | ORAL | Status: DC
  Administered 2021-12-05 – 2021-12-06 (×2): 12.5 mg
  Filled 2021-12-05 (×2): qty 1

## 2021-12-05 MED ORDER — IRBESARTAN 150 MG PO TABS
150.0000 mg | ORAL_TABLET | Freq: Every day | ORAL | Status: DC
  Administered 2021-12-05 – 2021-12-06 (×2): 150 mg
  Filled 2021-12-05 (×2): qty 1

## 2021-12-05 MED ORDER — ACETAMINOPHEN 325 MG RE SUPP: 325.0000 mg | Freq: Four times a day (QID) | RECTAL | Status: DC

## 2021-12-05 MED ORDER — GABAPENTIN 250 MG/5ML PO SOLN: 200.0000 mg | Freq: Every day | ORAL | Status: DC

## 2021-12-05 MED ORDER — GUAIFENESIN-DM 100-10 MG/5ML PO SYRP: 15.0000 mL | ORAL_SOLUTION | ORAL | Status: DC | PRN

## 2021-12-05 MED ORDER — METOPROLOL TARTRATE 25 MG/10 ML ORAL SUSPENSION
12.5000 mg | Freq: Two times a day (BID) | ORAL | Status: DC
  Administered 2021-12-05 – 2021-12-06 (×2): 12.5 mg
  Filled 2021-12-05 (×2): qty 5

## 2021-12-05 MED ORDER — COENZYME Q10 100 MG PO CAPS: 100.0000 mg | ORAL_CAPSULE | Freq: Every morning | ORAL | Status: DC

## 2021-12-05 MED ORDER — ASPIRIN EC 81 MG PO TBEC: 81.0000 mg | DELAYED_RELEASE_TABLET | Freq: Every day | ORAL | Status: DC

## 2021-12-05 MED ORDER — ADULT MULTIVITAMIN W/MINERALS CH: 1.0000 | ORAL_TABLET | Freq: Two times a day (BID) | ORAL | Status: DC

## 2021-12-05 MED ORDER — AMLODIPINE BESYLATE 5 MG PO TABS
5.0000 mg | ORAL_TABLET | Freq: Every day | ORAL | Status: DC
  Administered 2021-12-05: 5 mg
  Filled 2021-12-05: qty 1

## 2021-12-05 MED ORDER — UMECLIDINIUM BROMIDE 62.5 MCG/ACT IN AEPB
1.0000 | INHALATION_SPRAY | Freq: Every day | RESPIRATORY_TRACT | Status: DC
  Administered 2021-12-06 – 2021-12-10 (×5): 1 via RESPIRATORY_TRACT
  Filled 2021-12-05: qty 7

## 2021-12-05 MED ORDER — B COMPLEX VITAMINS PO CAPS: 1.0000 | ORAL_CAPSULE | Freq: Every day | ORAL | Status: DC

## 2021-12-05 MED ORDER — SIMVASTATIN 20 MG PO TABS
40.0000 mg | ORAL_TABLET | Freq: Every day | ORAL | Status: DC
  Administered 2021-12-05: 40 mg
  Filled 2021-12-05: qty 2

## 2021-12-05 MED ORDER — CLONIDINE HCL 0.2 MG PO TABS
0.2000 mg | ORAL_TABLET | Freq: Every day | ORAL | Status: DC
  Administered 2021-12-05: 0.2 mg
  Filled 2021-12-05: qty 1

## 2021-12-05 MED ORDER — KETOROLAC TROMETHAMINE 15 MG/ML IJ SOLN: 15.0000 mg | Freq: Four times a day (QID) | INTRAMUSCULAR | Status: AC | PRN

## 2021-12-05 MED ORDER — IRBESARTAN-HYDROCHLOROTHIAZIDE 150-12.5 MG PO TABS: 1.0000 | ORAL_TABLET | Freq: Every morning | ORAL | Status: DC

## 2021-12-05 MED ORDER — GABAPENTIN 250 MG/5ML PO SOLN
100.0000 mg | Freq: Every day | ORAL | Status: DC
  Administered 2021-12-05 – 2021-12-06 (×2): 100 mg
  Filled 2021-12-05 (×2): qty 2

## 2021-12-05 MED ORDER — GABAPENTIN 100 MG PO CAPS: 100.0000 mg | ORAL_CAPSULE | Freq: Every day | ORAL | Status: DC

## 2021-12-05 MED ORDER — ALUM & MAG HYDROXIDE-SIMETH 200-200-20 MG/5ML PO SUSP: 15.0000 mL | ORAL | Status: DC | PRN

## 2021-12-05 MED ORDER — GABAPENTIN 250 MG/5ML PO SOLN
200.0000 mg | Freq: Every day | ORAL | Status: DC
  Administered 2021-12-05: 200 mg
  Filled 2021-12-05 (×3): qty 4

## 2021-12-05 MED ORDER — GABAPENTIN 250 MG/5ML PO SOLN: 100.0000 mg | Freq: Every day | ORAL | Status: DC

## 2021-12-05 NOTE — Progress Notes (Signed)
12/05/2021 1630 Received pt to room 4E-01 from [facility].  Pt is A&O, no C/O voiced, states she is very tired and would like to get in bed.  Tele monitoe applied and CCMD notified.  CHG bath given.  Oriented to room, call light and bed.  Call bell in reach. [place]

## 2021-12-05 NOTE — Progress Notes (Addendum)
[facility]  Subjective  - Sitting up in chair.  States she has passed flatus.   Assessment/Planning: POD # 2  81 y.o. female is s/p  Aortobifemoral bypass Aortic endarterectomy Greater than 30 minutes lysis of adhesions Ligation of large vessel-inferior mesenteric vein Bilateral common femoral artery endarterectomy  NPO with NG tube OP total 425 last 24 hours cc, flatus passed per patient possible to start sips and chips. Acute blood loss anemia received 2 units PRBC stable @ 10 12/05/21 Urine OP good with last 24 hour total 1,125 cc, CR WNL Lungs NON labored breathing CV tachy high 90's to 150 with stable BP systolic. She is not requiring IV pain medication since starting Toradol. Possible transfer to progressive care pending DR. Ethan Kasperski exam and recommendations.  VASCULAR STAFF ADDENDUM: I have independently interviewed and examined the patient. I agree with the above.  Doing well. Passing gas. RBCs overnight Pain well-controlled, discussed with pharmacy, multimodal control. Good UOP, foley out today Continue NGT, will assess output this afternoon Aline can be d/cd Progressive 4E today Continue q12h labs, restart home meds   [name], MD [facility] Phone Number: [phone] 12/05/2021 8:04 AM    Objective (!) 114/48 97 98.3 F (36.8 C) (Oral) 18 95%  Intake/Output Summary (Last 24 hours) at 12/05/2021 0715 Last data filed at 12/05/2021 0600 Gross per 24 hour  Intake 2399.47 ml  Output 1550 ml  Net 849.47 ml    Doppler DP signals B B groins soft with ecchymosis, no hematoma noted Abdomin soft, NTTP Lungs non labored breathing  General no acute distress    Roxy Horseman 12/05/2021 7:15 AM --  Laboratory Lab Results: Recent Labs    12/04/21 2142 12/05/21 0314  WBC 10.0 9.9  HGB 6.8* 10.0*  HCT 20.9* 29.6*  PLT 145* 124*   BMET Recent Labs    12/04/21 1957  12/05/21 0314  NA 134* 138  K 3.6 4.0  CL 99 102  CO2 27 28  GLUCOSE 128* 128*  BUN 18 18  CREATININE 0.83 0.79  CALCIUM 7.2* 7.4*    COAG Lab Results  Component Value Date   INR 1.3 (H) 12/03/2021   INR 1.0 11/27/2021   INR 1.4 (H) 04/05/2021   No results found for: PTT

## 2021-12-05 NOTE — Progress Notes (Addendum)
Occupational Therapy Treatment Patient Details Name: Elizabeth [name] MRN: [id number] DOB: Apr 23, 1941 Today's Date: 12/05/2021   History of present illness Pt is an 81 y.o. female who presented 12/03/21 for elective aortobifemoral bypass, aortic endarterectomy, and bilateral common femoral artery endarterectomy secondary to critical bil lower extremity ischemia. PMH: CAD, COPD, GERD, HTN, heart murmur, osteopenia, PVD   OT comments  Pt progressing towards established OT goals. Pt performing functional mobility to/from bathroom with Min guard A and RW. Pt completing grooming and LB bathing while standing at sink with Min guard A. Performing toilet transfer with [place]. Continue to recommend dc to home with HHOT and will continue to follow acutely as admitted.    Recommendations for follow up therapy are one component of a multi-disciplinary discharge planning process, led by the attending physician.  Recommendations may be updated based on patient status, additional functional criteria and insurance authorization.    Follow Up Recommendations  Home health OT    Assistance Recommended at Discharge Set up Supervision/Assistance  Patient can return home with the following      Equipment Recommendations  BSC/3in1    Recommendations for Other Services      Precautions / Restrictions Precautions Precautions: Fall;Other (comment) Precaution Comments: NG tube Restrictions Weight Bearing Restrictions: No       Mobility Bed Mobility               General bed mobility comments: In recliner upon arrival    Transfers Overall transfer level: Needs assistance Equipment used: Rolling walker (2 wheels) Transfers: Sit to/from Stand Sit to Stand: Min guard           General transfer comment: Min Guard A for safety     Balance Overall balance assessment: Needs assistance Sitting-balance support: No upper extremity supported, Feet supported Sitting balance-Leahy Scale: Good      Standing balance support: During functional activity, No upper extremity supported Standing balance-Leahy Scale: Good Standing balance comment: Able to perform dynamic balance while bathing at sink with Min guard A                           ADL either performed or assessed with clinical judgement   ADL Overall ADL's : Needs assistance/impaired     Grooming: Min guard;Oral care;Wash/dry face Grooming Details (indicate cue type and reason): Min Guard A for safety     Lower Body Bathing: Min guard;Sit to/from stand Lower Body Bathing Details (indicate cue type and reason): Min Guard A for peri care while at sink         Toilet Transfer: Min guard;Ambulation;BSC/3in1;Rolling walker (2 wheels) (bsc over toilet) Toilet Transfer Details (indicate cue type and reason): Min Guard A for safety         Functional mobility during ADLs: Min guard;Minimal assistance;Rolling walker (2 wheels) General ADL Comments: Pt presenting with decreased balance and strength. However very motivated    Extremity/Trunk Assessment Upper Extremity Assessment Upper Extremity Assessment: Generalized weakness;LUE deficits/detail;RUE deficits/detail RUE Deficits / Details: edema and decreased grasp strength and FM skills RUE Coordination: decreased fine motor LUE Deficits / Details: edema and decreased grasp strength and FM skills LUE Coordination: decreased fine motor           Vision       Perception     Praxis      Cognition Arousal/Alertness: Awake/alert Behavior During Therapy: WFL for tasks assessed/performed Overall Cognitive Status: Within Functional Limits for tasks  assessed                                 General Comments: Lethargic and sleepy upon arrival. However, waking up with movement.        Exercises      Shoulder Instructions       General Comments VSS on RA throughout. Daughter present throughout.    Pertinent Vitals/ Pain       Pain  Assessment Pain Assessment: Faces Faces Pain Scale: Hurts a little bit Pain Location: abdomen Pain Descriptors / Indicators: Discomfort, Grimacing, Operative site guarding Pain Intervention(s): Monitored during session, Repositioned  Home Living                                          Prior Functioning/Environment              Frequency  Min 2X/week        Progress Toward Goals  OT Goals(current goals can now be found in the care plan section)  Progress towards OT goals: Progressing toward goals  Acute Rehab OT Goals OT Goal Formulation: With patient Time For Goal Achievement: 12/18/21 Potential to Achieve Goals: Good ADL Goals Pt Will Perform Grooming: with modified independence Pt Will Perform Upper Body Dressing: with modified independence Pt Will Perform Lower Body Dressing: with min assist;with adaptive equipment;sit to/from stand Pt Will Transfer to Toilet: with min assist;ambulating;bedside commode Additional ADL Goal #1: pt will complete bed mobility mod I as precursor to adls.  Plan Discharge plan remains appropriate    Co-evaluation                 AM-PAC OT "6 Clicks" Daily Activity     Outcome Measure   Help from another person eating meals?: A Little Help from another person taking care of personal grooming?: A Little Help from another person toileting, which includes using toliet, bedpan, or urinal?: A Lot Help from another person bathing (including washing, rinsing, drying)?: A Lot Help from another person to put on and taking off regular upper body clothing?: A Little Help from another person to put on and taking off regular lower body clothing?: A Lot 6 Click Score: 15    End of Session Equipment Utilized During Treatment: Rolling walker (2 wheels)  OT Visit Diagnosis: Unsteadiness on feet (R26.81);Muscle weakness (generalized) (M62.81)   Activity Tolerance Patient tolerated treatment well   Patient Left in  chair;with call bell/phone within reach;with chair alarm set;with family/visitor present   Nurse Communication Mobility status;Precautions        Time: 3845-3646 OT Time Calculation (min): 32 min  Charges: OT General Charges $OT Visit: 1 Visit OT Treatments $Self Care/Home Management : 23-37 mins  [place], OTR/L Acute Rehab Pager: [phone] Office: [place] 12/05/2021, 3:55 PM

## 2021-12-05 NOTE — Progress Notes (Signed)
Physical Therapy Treatment Patient Details Name: Elizabeth [name] MRN: [id number] DOB: 12-28-1940 Today's Date: 12/05/2021   History of Present Illness Pt is an 81 y.o. female who presented 12/03/21 for elective aortobifemoral bypass, aortic endarterectomy, and bilateral common femoral artery endarterectomy secondary to critical bil lower extremity ischemia. PMH: CAD, COPD, GERD, HTN, heart murmur, osteopenia, PVD    PT Comments    Pt is making great progress with mobility, ambulating up to ~192 ft at a min guard assist level without UE support. However, she ambulates at a slow pace with decreased stride and bil feet clearance, reporting her legs "still do not feel right" reporting some continued numbness, thereby impacting her balance. I suspect pt will continue to progress quickly, thus updated d/c recs to no PT follow-up. Will continue to follow acutely to maximize her return to baseline prior to return home.    Recommendations for follow up therapy are one component of a multi-disciplinary discharge planning process, led by the attending physician.  Recommendations may be updated based on patient status, additional functional criteria and insurance authorization.  Follow Up Recommendations  No PT follow up     Assistance Recommended at Discharge Intermittent Supervision/Assistance  Patient can return home with the following A little help with bathing/dressing/bathroom;Assist for transportation;Help with stairs or ramp for entrance;Assistance with cooking/housework   Equipment Recommendations  None recommended by PT    Recommendations for Other Services       Precautions / Restrictions Precautions Precautions: Fall;Other (comment) Precaution Comments: NG tube Restrictions Weight Bearing Restrictions: No     Mobility  Bed Mobility               General bed mobility comments: Pt up in recliner upon arrival.    Transfers Overall transfer level: Needs assistance Equipment  used: Rolling walker (2 wheels) Transfers: Sit to/from Stand Sit to Stand: Min guard           General transfer comment: Pt able to come to stand from recliner 2x with min guard for safety, slightly increased time.    Ambulation/Gait Ambulation/Gait assistance: Min guard Gait Distance (Feet): 192 Feet Assistive device: Rolling walker (2 wheels), None Gait Pattern/deviations: Step-through pattern, Decreased stride length, Shuffle Gait velocity: reduced Gait velocity interpretation: <1.8 ft/sec, indicate of risk for recurrent falls   General Gait Details: Pt taking short, shuffling steps even when cued to increase speed and stride length. Pt reports she felt like her legs still felt "not normal" today, agreeing they felt a little numb. Progressed from using RW to no AD quickly, no LOB, min guard for safety.   Stairs             Wheelchair Mobility    Modified Rankin (Stroke Patients Only)       Balance Overall balance assessment: Needs assistance Sitting-balance support: No upper extremity supported, Feet supported Sitting balance-Leahy Scale: Good     Standing balance support: During functional activity, No upper extremity supported, Bilateral upper extremity supported Standing balance-Leahy Scale: Fair Standing balance comment: Able to ambulate without UE support or LOB, but decreased speed suggestive of imbalance.                            Cognition Arousal/Alertness: Lethargic Behavior During Therapy: WFL for tasks assessed/performed Overall Cognitive Status: Within Functional Limits for tasks assessed  General Comments: Lethargic, needing stimulation to awaken upon arrival and quickly falling back asleep once in recliner end of session. Able to maintain attention and stay awake with mobility.        Exercises      General Comments General comments (skin integrity, edema, etc.): VSS on RA  throughout; pt reporting being too tired to participate in ther ex following gait bout      Pertinent Vitals/Pain Pain Assessment Pain Assessment: Faces Faces Pain Scale: Hurts a little bit Pain Location: abdomen, legs Pain Descriptors / Indicators: Discomfort, Grimacing, Operative site guarding Pain Intervention(s): Limited activity within patient's tolerance, Monitored during session, Repositioned    Home Living                          Prior Function            PT Goals (current goals can now be found in the care plan section) Acute Rehab PT Goals Patient Stated Goal: to get better PT Goal Formulation: With patient Time For Goal Achievement: 12/18/21 Potential to Achieve Goals: Good Progress towards PT goals: Progressing toward goals    Frequency    Min 3X/week      PT Plan Discharge plan needs to be updated    Co-evaluation              AM-PAC PT "6 Clicks" Mobility   Outcome Measure  Help needed turning from your back to your side while in a flat bed without using bedrails?: A Little Help needed moving from lying on your back to sitting on the side of a flat bed without using bedrails?: A Lot Help needed moving to and from a bed to a chair (including a wheelchair)?: A Little Help needed standing up from a chair using your arms (e.g., wheelchair or bedside chair)?: A Little Help needed to walk in hospital room?: A Little Help needed climbing 3-5 steps with a railing? : A Little 6 Click Score: 17    End of Session Equipment Utilized During Treatment: Gait belt Activity Tolerance: Patient tolerated treatment well Patient left: in chair;with call bell/phone within reach;with chair alarm set Nurse Communication: Mobility status;Other (comment) (vitals) PT Visit Diagnosis: Unsteadiness on feet (R26.81);Other abnormalities of gait and mobility (R26.89);Muscle weakness (generalized) (M62.81);Difficulty in walking, not elsewhere classified  (R26.2);Pain Pain - Right/Left:  (bil) Pain - part of body: Leg     Time: 1429-1450 PT Time Calculation (min) (ACUTE ONLY): 21 min  Charges:  $Gait Training: 8-22 mins                     Moishe Spice, PT, DPT Acute Rehabilitation Services  Pager: [phone] Office: [place] 12/05/2021, 3:10 PM

## 2021-12-06 LAB — BASIC METABOLIC PANEL
Anion gap: 8 (ref 5–15)
Anion gap: 6 (ref 5–15)
BUN: 14 mg/dL (ref 8–23)
BUN: 13 mg/dL (ref 8–23)
CO2: 23 mmol/L (ref 22–32)
CO2: 22 mmol/L (ref 22–32)
Calcium: 7.2 mg/dL — ABNORMAL LOW (ref 8.9–10.3)
Calcium: 7.3 mg/dL — ABNORMAL LOW (ref 8.9–10.3)
Chloride: 106 mmol/L (ref 98–111)
Chloride: 107 mmol/L (ref 98–111)
Creatinine, Ser: 0.67 mg/dL (ref 0.44–1.00)
Creatinine, Ser: 0.79 mg/dL (ref 0.44–1.00)
GFR, Estimated: >60 mL/min (ref >60)
GFR, Estimated: >60 mL/min (ref >60)
Glucose, Bld: 87 mg/dL (ref 70–99)
Glucose, Bld: 109 mg/dL — ABNORMAL HIGH (ref 70–99)
Potassium: 3.8 mmol/L (ref 3.5–5.1)
Potassium: 2.9 mmol/L — ABNORMAL LOW (ref 3.5–5.1)
Sodium: 137 mmol/L (ref 135–145)
Sodium: 135 mmol/L (ref 135–145)

## 2021-12-06 LAB — CBC
HCT: 28.6 % — ABNORMAL LOW (ref 36.0–46.0)
HCT: 29.2 % — ABNORMAL LOW (ref 36.0–46.0)
Hemoglobin: 9.9 g/dL — ABNORMAL LOW (ref 12.0–15.0)
Hemoglobin: 10.2 g/dL — ABNORMAL LOW (ref 12.0–15.0)
MCH: 31 pg (ref 26.0–34.0)
MCH: 32.1 pg (ref 26.0–34.0)
MCHC: 34.6 g/dL (ref 30.0–36.0)
MCHC: 34.9 g/dL (ref 30.0–36.0)
MCV: 89.7 fL (ref 80.0–100.0)
MCV: 91.8 fL (ref 80.0–100.0)
Platelets: 93 10*3/uL — ABNORMAL LOW (ref 150–400)
Platelets: 147 10*3/uL — ABNORMAL LOW (ref 150–400)
RBC: 3.19 MIL/uL — ABNORMAL LOW (ref 3.87–5.11)
RBC: 3.18 MIL/uL — ABNORMAL LOW (ref 3.87–5.11)
RDW: 14.3 % (ref 11.5–15.5)
RDW: 14.2 % (ref 11.5–15.5)
WBC: 8.5 10*3/uL (ref 4.0–10.5)
WBC: 8.7 10*3/uL (ref 4.0–10.5)
nRBC: 0 % (ref 0.0–0.2)
nRBC: 0 % (ref 0.0–0.2)

## 2021-12-06 MED ORDER — POTASSIUM CHLORIDE 20 MEQ PO PACK: 20.0000 meq | PACK | Freq: Every day | ORAL | Status: DC | PRN

## 2021-12-06 MED ORDER — CLONIDINE HCL 0.2 MG PO TABS
0.2000 mg | ORAL_TABLET | Freq: Every day | ORAL | Status: DC
  Administered 2021-12-06 – 2021-12-09 (×3): 0.2 mg via ORAL
  Filled 2021-12-06 (×4): qty 1

## 2021-12-06 MED ORDER — ACETAMINOPHEN 325 MG PO TABS
650.0000 mg | ORAL_TABLET | Freq: Four times a day (QID) | ORAL | Status: DC
  Administered 2021-12-06 – 2021-12-09 (×7): 650 mg via ORAL
  Filled 2021-12-06 (×9): qty 2

## 2021-12-06 MED ORDER — ASPIRIN 81 MG PO CHEW
81.0000 mg | CHEWABLE_TABLET | Freq: Every day | ORAL | Status: DC
  Administered 2021-12-06 – 2021-12-09 (×4): 81 mg via ORAL
  Filled 2021-12-06 (×4): qty 1

## 2021-12-06 MED ORDER — GUAIFENESIN-DM 100-10 MG/5ML PO SYRP: 15.0000 mL | ORAL_SOLUTION | ORAL | Status: DC | PRN

## 2021-12-06 MED ORDER — ALUM & MAG HYDROXIDE-SIMETH 200-200-20 MG/5ML PO SUSP: 15.0000 mL | ORAL | Status: DC | PRN

## 2021-12-06 MED ORDER — B COMPLEX-C PO TABS
1.0000 | ORAL_TABLET | Freq: Every day | ORAL | Status: DC
  Administered 2021-12-07 – 2021-12-10 (×4): 1 via ORAL
  Filled 2021-12-06 (×4): qty 1

## 2021-12-06 MED ORDER — ACETAMINOPHEN 160 MG/5ML PO SOLN: 650.0000 mg | Freq: Four times a day (QID) | ORAL | Status: DC | PRN

## 2021-12-06 MED ORDER — HYDROCHLOROTHIAZIDE 12.5 MG PO TABS
12.5000 mg | ORAL_TABLET | Freq: Every day | ORAL | Status: DC
  Administered 2021-12-07 – 2021-12-10 (×4): 12.5 mg via ORAL
  Filled 2021-12-06 (×4): qty 1

## 2021-12-06 MED ORDER — FAMOTIDINE 20 MG PO TABS: 20.0000 mg | ORAL_TABLET | Freq: Every day | ORAL | Status: DC | PRN

## 2021-12-06 MED ORDER — IRBESARTAN 150 MG PO TABS
150.0000 mg | ORAL_TABLET | Freq: Every day | ORAL | Status: DC
  Administered 2021-12-07 – 2021-12-10 (×4): 150 mg via ORAL
  Filled 2021-12-06 (×4): qty 1

## 2021-12-06 MED ORDER — GABAPENTIN 100 MG PO CAPS
100.0000 mg | ORAL_CAPSULE | Freq: Every day | ORAL | Status: DC
  Administered 2021-12-07 – 2021-12-10 (×4): 100 mg via ORAL
  Filled 2021-12-06 (×4): qty 1

## 2021-12-06 MED ORDER — METOPROLOL TARTRATE 25 MG/10 ML ORAL SUSPENSION: 12.5000 mg | Freq: Two times a day (BID) | ORAL | Status: DC

## 2021-12-06 MED ORDER — MELOXICAM 7.5 MG PO TABS
15.0000 mg | ORAL_TABLET | Freq: Every day | ORAL | Status: DC
  Administered 2021-12-06 – 2021-12-09 (×4): 15 mg via ORAL
  Filled 2021-12-06 (×4): qty 2

## 2021-12-06 MED ORDER — AMLODIPINE BESYLATE 5 MG PO TABS
5.0000 mg | ORAL_TABLET | Freq: Every day | ORAL | Status: DC
  Administered 2021-12-06 – 2021-12-09 (×3): 5 mg via ORAL
  Filled 2021-12-06 (×4): qty 1

## 2021-12-06 MED ORDER — SIMVASTATIN 20 MG PO TABS
40.0000 mg | ORAL_TABLET | Freq: Every day | ORAL | Status: DC
  Administered 2021-12-06 – 2021-12-07 (×2): 40 mg via ORAL
  Filled 2021-12-06 (×2): qty 2

## 2021-12-06 MED ORDER — GABAPENTIN 100 MG PO CAPS
200.0000 mg | ORAL_CAPSULE | Freq: Every day | ORAL | Status: DC
  Administered 2021-12-06 – 2021-12-09 (×4): 200 mg via ORAL
  Filled 2021-12-06 (×4): qty 2

## 2021-12-06 MED ORDER — METOPROLOL TARTRATE 12.5 MG HALF TABLET
12.5000 mg | ORAL_TABLET | Freq: Two times a day (BID) | ORAL | Status: DC
  Administered 2021-12-06 – 2021-12-10 (×8): 12.5 mg via ORAL
  Filled 2021-12-06 (×8): qty 1

## 2021-12-06 NOTE — Care Management Important Message (Signed)
Important Message  Patient Details  Name: Elizabeth [name] MRN: [id number] Date of Birth: [birth year]   Medicare Important Message Given:  Yes     Shelda Altes 12/06/2021, 8:46 AM

## 2021-12-06 NOTE — Progress Notes (Addendum)
°  Progress Note    12/06/2021 7:35 AM 3 Days Post-Op  Subjective:  passing gas; walked yesterday; soreness mainly in groin incisions   Vitals:   12/05/21 2250 12/06/21 0300  BP: (!) 144/55 (!) 113/48  Pulse: 94 71  Resp: 13 13  Temp: 97.7 F (36.5 C) 97.6 F (36.4 C)  SpO2: 97% 96%   Physical Exam: Lungs:  non labored Incisions:  abd and groin incisions c/d/i Extremities:  1+ palpable DP pulses Abdomen:  soft, ND,  some generalized tenderness Neurologic: A&O  CBC    Component Value Date/Time   WBC 8.5 12/06/2021 0521   RBC 3.19 (L) 12/06/2021 0521   HGB 9.9 (L) 12/06/2021 0521   HCT 28.6 (L) 12/06/2021 0521   PLT 93 (L) 12/06/2021 0521   MCV 89.7 12/06/2021 0521   MCH 31.0 12/06/2021 0521   MCHC 34.6 12/06/2021 0521   RDW 14.3 12/06/2021 0521   LYMPHSABS 1.1 04/04/2021 1658   MONOABS 1.0 04/04/2021 1658   EOSABS 0.0 04/04/2021 1658   BASOSABS 0.0 04/04/2021 1658    BMET    Component Value Date/Time   NA 137 12/06/2021 0521   K 3.8 12/06/2021 0521   CL 106 12/06/2021 0521   CO2 23 12/06/2021 0521   GLUCOSE 87 12/06/2021 0521   BUN 14 12/06/2021 0521   CREATININE 0.67 12/06/2021 0521   CALCIUM 7.2 (L) 12/06/2021 0521   GFRNONAA >60 12/06/2021 0521   GFRAA >60 04/11/2020 1053    INR    Component Value Date/Time   INR 1.3 (H) 12/03/2021 1150     Intake/Output Summary (Last 24 hours) at 12/06/2021 0735 Last data filed at 12/06/2021 0636 Gross per 24 hour  Intake 1263.96 ml  Output 935 ml  Net 328.96 ml     Assessment/Plan:  81 y.o. female is s/p ABF bypass with B CFA endarterectomy 3 Days Post-Op   BLE well perfused with palpable DP pulses GI: passing gas, minimal NG output; d/c NG tube; clear liquid diet this afternoon if no N/V this morning Renal: Good UOP; d/c IVF; may consider diuresis if edema worsens OOB with therapy again today  Dagoberto Ligas, PA-C [facility] [phone] 12/06/2021 7:35 AM  VASCULAR STAFF  ADDENDUM: I have independently interviewed and examined the patient. I agree with the above.  Progressing appropriately. Remove NGT today. Turn off IVF. CLD at noon if no nausea this morning.   [name], MD [facility] Phone Number: [phone] 12/06/2021 8:17 AM

## 2021-12-06 NOTE — Progress Notes (Signed)
Mobility Specialist: Progress Note   12/06/21 1524  Mobility  Activity Ambulated with assistance in hallway  Level of Assistance Standby assist, set-up cues, supervision of patient - no hands on  Assistive Device Front wheel walker  Distance Ambulated (ft) 270 ft  Activity Response Tolerated well  $Mobility charge 1 Mobility   Pre-Mobility: 73 HR, 96% SpO2 Post-Mobility: 99 HR, 97% SpO2  Pt asleep upon entering room, easily awakened and agreeable to mobility. C/o bilateral ankle and abdominal pain during ambulation, no rating given. Pt to BR, void successful, then to bed per request. Call bell and phone at her side.   [facility] Devyn Griffing Mobility Specialist Mobility Specialist [address] [place]: [phone] Mobility Specialist [address] [place] and [place]: [phone]

## 2021-12-06 NOTE — Progress Notes (Signed)
Notified pharmacy that patient no longer has NG tube and needs meds changed to PO instead of per tube.

## 2021-12-06 NOTE — Plan of Care (Signed)
  Problem: Health Behavior/Discharge Planning: Goal: Ability to manage health-related needs will improve Outcome: Progressing   Problem: Clinical Measurements: Goal: Will remain free from infection Outcome: Progressing   

## 2021-12-06 NOTE — Progress Notes (Signed)
Pt's NG tube removed per MD order.  No complication to report at this time.

## 2021-12-06 NOTE — Progress Notes (Signed)
Pharmacy note: pain management  81 yo female s/p ABF bypass with B CFA endarterectomy on 1/31. Pharmacy asked to assist with pain management.   Current pain regimen -Scheduled meds: Mobic 15mg /day (on PTA), gabapentin 200mg  qam/200mg  qhs (on PTA), APAP 650mg  q6h (stops 2/4) -PRN: morphine (2mg  last 24 hours), ketorolac (15mg  last 24hrs).   Last reported pain score was 0. I spoke with her this morning at she did describe some stomach pain and related this to her incision sites and she also reported it may be associated with reflux (she is on IV protonix). She was very tired during the conversation.   Plan -No additional pain medication at this time -Will add PRN APAP to start 2/4 -Will need to follow up if there is any increase in pain after the scheduled APAP stops 2/4  Hildred Laser, PharmD Clinical Pharmacist **Pharmacist phone directory can now be found on [email or web address] (PW [email or web address]).  Listed under [place].

## 2021-12-06 NOTE — Progress Notes (Signed)
Occupational Therapy Treatment Patient Details Name: Elizabeth [name] MRN: [id number] DOB: Jul 01, 1941 Today's Date: 12/06/2021   History of present illness Pt is an 81 y.o. female who presented 12/03/21 for elective aortobifemoral bypass, aortic endarterectomy, and bilateral common femoral artery endarterectomy secondary to critical bil lower extremity ischemia. PMH: CAD, COPD, GERD, HTN, heart murmur, osteopenia, PVD   OT comments  Patient received sitting on EOB and had recently had NG tube removed.  Patient limited by abdominal pain but willing to participate and eager to return to independent. Patient was min guard to perform transfer to 3n1 and oral care standing at sink. Patient felt fatigue and performed remaining grooming tasks seated. Patient making good gains with OT services. Acute OT to continue to follow.    Recommendations for follow up therapy are one component of a multi-disciplinary discharge planning process, led by the attending physician.  Recommendations may be updated based on patient status, additional functional criteria and insurance authorization.    Follow Up Recommendations  Home health OT    Assistance Recommended at Discharge Set up Supervision/Assistance  Patient can return home with the following  A little help with walking and/or transfers;A little help with bathing/dressing/bathroom;Assistance with cooking/housework;Assist for transportation   Equipment Recommendations  BSC/3in1    Recommendations for Other Services      Precautions / Restrictions Precautions Precautions: Fall Restrictions Weight Bearing Restrictions: No       Mobility Bed Mobility Overal bed mobility: Needs Assistance Bed Mobility: Supine to Sit     Supine to sit: Min assist     General bed mobility comments: min assist with trunk due to abdominal pain    Transfers Overall transfer level: Needs assistance Equipment used: Rolling walker (2 wheels) Transfers: Sit to/from  Stand Sit to Stand: Min guard           General transfer comment: min guard for safety     Balance Overall balance assessment: Needs assistance Sitting-balance support: No upper extremity supported, Feet supported Sitting balance-Leahy Scale: Good     Standing balance support: During functional activity, No upper extremity supported, Bilateral upper extremity supported Standing balance-Leahy Scale: Fair Standing balance comment: able to stand at sink for grooming tasks                           ADL either performed or assessed with clinical judgement   ADL Overall ADL's : Needs assistance/impaired     Grooming: Wash/dry hands;Wash/dry face;Oral care;Brushing hair;Sitting;Standing Grooming Details (indicate cue type and reason): min guard while standing at sink for oral care.  washed face and brused hair seated                 Toilet Transfer: Min guard;Ambulation;BSC/3in1;Rolling walker (2 wheels) Toilet Transfer Details (indicate cue type and reason): Min Guard A for safety Toileting- Clothing Manipulation and Hygiene: Min guard;Sit to/from stand Toileting - Clothing Manipulation Details (indicate cue type and reason): hygiene performed standing     Functional mobility during ADLs: Min guard;Minimal assistance;Rolling walker (2 wheels) General ADL Comments: increased time to perform tasks due to abdominal pain and weakness    Extremity/Trunk Assessment Upper Extremity Assessment RUE Deficits / Details: edema and decreased grasp strength and FM skills RUE Coordination: decreased fine motor LUE Deficits / Details: edema and decreased grasp strength and FM skills LUE Coordination: decreased fine motor            Vision  Perception     Praxis      Cognition Arousal/Alertness: Awake/alert Behavior During Therapy: WFL for tasks assessed/performed Overall Cognitive Status: Within Functional Limits for tasks assessed                                  General Comments: awake and alert        Exercises      Shoulder Instructions       General Comments      Pertinent Vitals/ Pain       Pain Assessment Pain Assessment: 0-10 Pain Score: 6  Faces Pain Scale: Hurts little more Pain Location: abdomen, right hand with use Pain Descriptors / Indicators: Discomfort, Grimacing, Operative site guarding Pain Intervention(s): Monitored during session, Limited activity within patient's tolerance  Home Living                                          Prior Functioning/Environment              Frequency  Min 2X/week        Progress Toward Goals  OT Goals(current goals can now be found in the care plan section)  Progress towards OT goals: Progressing toward goals  Acute Rehab OT Goals Patient Stated Goal: get better OT Goal Formulation: With patient Time For Goal Achievement: 12/18/21 Potential to Achieve Goals: Good ADL Goals Pt Will Perform Grooming: with modified independence Pt Will Perform Upper Body Dressing: with modified independence Pt Will Perform Lower Body Dressing: with min assist;with adaptive equipment;sit to/from stand Pt Will Transfer to Toilet: with min assist;ambulating;bedside commode Additional ADL Goal #1: pt will complete bed mobility mod I as precursor to adls.  Plan Discharge plan remains appropriate    Co-evaluation                 AM-PAC OT "6 Clicks" Daily Activity     Outcome Measure   Help from another person eating meals?: A Little Help from another person taking care of personal grooming?: A Little Help from another person toileting, which includes using toliet, bedpan, or urinal?: A Lot Help from another person bathing (including washing, rinsing, drying)?: A Lot Help from another person to put on and taking off regular upper body clothing?: A Little Help from another person to put on and taking off regular lower body clothing?: A  Lot 6 Click Score: 15    End of Session Equipment Utilized During Treatment: Rolling walker (2 wheels)  OT Visit Diagnosis: Unsteadiness on feet (R26.81);Muscle weakness (generalized) (M62.81)   Activity Tolerance Patient tolerated treatment well   Patient Left in chair;with call bell/phone within reach;with chair alarm set   Nurse Communication Mobility status;Other (comment) (complaints of abdomin pain)        Time: 7673-4193 OT Time Calculation (min): 35 min  Charges: OT General Charges $OT Visit: 1 Visit OT Treatments $Self Care/Home Management : 23-37 mins  Lodema Hong, [place]  Pager [phone] Office [place] 12/06/2021, 1:28 PM

## 2021-12-06 NOTE — Plan of Care (Signed)
  Problem: Health Behavior/Discharge Planning: Goal: Ability to manage health-related needs will improve Outcome: Progressing   Problem: Clinical Measurements: Goal: Will remain free from infection Outcome: Progressing Goal: Diagnostic test results will improve Outcome: Progressing   

## 2021-12-07 MED ORDER — PANTOPRAZOLE SODIUM 40 MG PO TBEC
40.0000 mg | DELAYED_RELEASE_TABLET | Freq: Two times a day (BID) | ORAL | Status: DC
  Administered 2021-12-07 – 2021-12-10 (×6): 40 mg via ORAL
  Filled 2021-12-07 (×6): qty 1

## 2021-12-07 MED ORDER — DOCUSATE SODIUM 100 MG PO CAPS: 100.0000 mg | ORAL_CAPSULE | Freq: Two times a day (BID) | ORAL | Status: DC | PRN

## 2021-12-07 MED ORDER — POLYETHYLENE GLYCOL 3350 17 G PO PACK
17.0000 g | PACK | Freq: Every day | ORAL | Status: DC
  Administered 2021-12-07 – 2021-12-09 (×3): 17 g via ORAL
  Filled 2021-12-07 (×4): qty 1

## 2021-12-07 NOTE — Progress Notes (Signed)
Pharmacy note: pain management  81 yo female s/p ABF bypass with B CFA endarterectomy on 1/31. Pharmacy asked to assist with pain management.   Current pain regimen -Scheduled meds: Mobic 15mg /day (on PTA), gabapentin 200mg  qam/200mg  qhs (on PTA), APAP 650mg  q6h (stops 2/4) -PRN: morphine (2mg  last 24 hours), ketorolac (15mg  last 24hrs).   Last reported pain score was 5. I spoke with her this afternoon and she reported some pain, but does not need escalation in medications at this time.   Plan -No additional pain medication at this time -Will add PRN APAP to start 2/4 -Will need to follow up if there is any increase in pain after the scheduled APAP stops 2/4  Thank you for allowing pharmacy to participate in this patient's care.  Reatha Harps, PharmD PGY1 Pharmacy Resident 12/07/2021 12:51 PM Check [email or web address] for unit specific pharmacy number

## 2021-12-07 NOTE — Progress Notes (Signed)
Mobility Specialist Progress Note    12/07/21 1459  Mobility  Activity Ambulated with assistance in hallway  Level of Assistance Contact guard assist, steadying assist  Assistive Device  (HHA)  Distance Ambulated (ft) 260 ft  Activity Response Tolerated fair  $Mobility charge 1 Mobility   Pre-Mobility: 65 HR, 110/49 BP Post-Mobility: 82 HR  Pt received in bed and agreeable. C/o feeling 'really really really tired'. Pt returned to bed with call bell in reach.   [facility] Mobility Specialist  M.S. 2C and 6E: [phone] M.S. 4E: [phone]

## 2021-12-07 NOTE — Progress Notes (Addendum)
°  Progress Note    12/07/2021 8:26 AM 4 Days Post-Op  Subjective:  no major complaints. Wants to eat. Is passing flatus but still no BM. Tolerating clears. No n/v   Vitals:   12/07/21 0818 12/07/21 0824  BP: (!) 113/52   Pulse: 66 73  Resp: 16 16  Temp: 97.8 F (36.6 C)   SpO2: 95% 95%   Physical Exam: Cardiac:  regular Lungs:  non labored Incisions: B groins and abdominal incision c/d/i without swelling or hematoma.  Extremities: well perfused and warm with palpable DP pulses bilaterally Abdomen:  flat, soft, non distended, non tender Neurologic: alert and oriented   CBC    Component Value Date/Time   WBC 8.7 12/06/2021 1703   RBC 3.18 (L) 12/06/2021 1703   HGB 10.2 (L) 12/06/2021 1703   HCT 29.2 (L) 12/06/2021 1703   PLT 147 (L) 12/06/2021 1703   MCV 91.8 12/06/2021 1703   MCH 32.1 12/06/2021 1703   MCHC 34.9 12/06/2021 1703   RDW 14.2 12/06/2021 1703   LYMPHSABS 1.1 04/04/2021 1658   MONOABS 1.0 04/04/2021 1658   EOSABS 0.0 04/04/2021 1658   BASOSABS 0.0 04/04/2021 1658    BMET    Component Value Date/Time   NA 135 12/06/2021 1703   K 2.9 (L) 12/06/2021 1703   CL 107 12/06/2021 1703   CO2 22 12/06/2021 1703   GLUCOSE 109 (H) 12/06/2021 1703   BUN 13 12/06/2021 1703   CREATININE 0.79 12/06/2021 1703   CALCIUM 7.3 (L) 12/06/2021 1703   GFRNONAA >60 12/06/2021 1703   GFRAA >60 04/11/2020 1053    INR    Component Value Date/Time   INR 1.3 (H) 12/03/2021 1150     Intake/Output Summary (Last 24 hours) at 12/07/2021 0826 Last data filed at 12/07/2021 0543 Gross per 24 hour  Intake 250 ml  Output 1400 ml  Net -1150 ml     Assessment/Plan:  81 y.o. female is s/p ABF bypass with B CFA endart 4 Days Post-Op   BLE well perfused with palpable DP pulses Incisions are all well appearing without swelling or hematoma She is still passing gas, no BM, tolerating clear liquid diet, no N/V. May advance tomorrow to softs Good UOP Tolerating mobility OOB with  therapy again today  DVT prophylaxis:  sq heparin    Karoline Caldwell, PA-C [facility] [phone] 12/07/2021 8:26 AM  I agree with the above.  I have seen and examined the patient.  She has palpable pedal pulses.  She is passing flatus and wants to eat.  She has requested oatmeal.  Continue to mobilize  [employer]

## 2021-12-08 LAB — BPAM RBC
Blood Product Expiration Date: 202302072359
Blood Product Expiration Date: 202302252359
Blood Product Expiration Date: 202302252359
Blood Product Expiration Date: 202302262359
Blood Product Expiration Date: 202302262359
Blood Product Expiration Date: 202302262359
ISSUE DATE / TIME: 202301311301
ISSUE DATE / TIME: 202302012317
ISSUE DATE / TIME: 202302012317
ISSUE DATE / TIME: 202302021236
Unit Type and Rh: 6200
Unit Type and Rh: 6200
Unit Type and Rh: 6200
Unit Type and Rh: 6200
Unit Type and Rh: 6200
Unit Type and Rh: 6200

## 2021-12-08 LAB — TYPE AND SCREEN
ABO/RH(D): A POS
Antibody Screen: NEGATIVE
Unit division: 0
Unit division: 0
Unit division: 0
Unit division: 0
Unit division: 0
Unit division: 0

## 2021-12-08 LAB — BASIC METABOLIC PANEL
Anion gap: 10 (ref 5–15)
BUN: 12 mg/dL (ref 8–23)
CO2: 23 mmol/L (ref 22–32)
Calcium: 8.3 mg/dL — ABNORMAL LOW (ref 8.9–10.3)
Chloride: 104 mmol/L (ref 98–111)
Creatinine, Ser: 0.94 mg/dL (ref 0.44–1.00)
GFR, Estimated: >60 mL/min (ref >60)
Glucose, Bld: 131 mg/dL — ABNORMAL HIGH (ref 70–99)
Potassium: 3.4 mmol/L — ABNORMAL LOW (ref 3.5–5.1)
Sodium: 137 mmol/L (ref 135–145)

## 2021-12-08 MED ORDER — POTASSIUM CHLORIDE CRYS ER 20 MEQ PO TBCR
40.0000 meq | EXTENDED_RELEASE_TABLET | Freq: Two times a day (BID) | ORAL | Status: AC
  Administered 2021-12-08: 40 meq via ORAL
  Filled 2021-12-08: qty 2

## 2021-12-08 MED ORDER — ATORVASTATIN CALCIUM 10 MG PO TABS
20.0000 mg | ORAL_TABLET | Freq: Every day | ORAL | Status: DC
  Administered 2021-12-08 – 2021-12-10 (×3): 20 mg via ORAL
  Filled 2021-12-08 (×3): qty 2

## 2021-12-08 NOTE — Progress Notes (Addendum)
°  Progress Note    12/08/2021 7:03 AM 5 Days Post-Op  Subjective:  no complaints   Vitals:   12/08/21 0025 12/08/21 0433  BP: 132/60 (!) 134/55  Pulse: 75 78  Resp: 18 16  Temp: 98.8 F (37.1 C) 97.9 F (36.6 C)  SpO2: 94% 96%   Physical Exam: Cardiac:  regular Lungs:  non labored Incisions:  bilateral groins and midline incision is clean, dry and intact. No swelling or hematoma Extremities:  well perfused and warm with palpable Dp pulses bilaterally Abdomen:  flat, soft, non distended. Expected tenderness Neurologic: alert and oriented  CBC    Component Value Date/Time   WBC 8.7 12/06/2021 1703   RBC 3.18 (L) 12/06/2021 1703   HGB 10.2 (L) 12/06/2021 1703   HCT 29.2 (L) 12/06/2021 1703   PLT 147 (L) 12/06/2021 1703   MCV 91.8 12/06/2021 1703   MCH 32.1 12/06/2021 1703   MCHC 34.9 12/06/2021 1703   RDW 14.2 12/06/2021 1703   LYMPHSABS 1.1 04/04/2021 1658   MONOABS 1.0 04/04/2021 1658   EOSABS 0.0 04/04/2021 1658   BASOSABS 0.0 04/04/2021 1658    BMET    Component Value Date/Time   NA 135 12/06/2021 1703   K 2.9 (L) 12/06/2021 1703   CL 107 12/06/2021 1703   CO2 22 12/06/2021 1703   GLUCOSE 109 (H) 12/06/2021 1703   BUN 13 12/06/2021 1703   CREATININE 0.79 12/06/2021 1703   CALCIUM 7.3 (L) 12/06/2021 1703   GFRNONAA >60 12/06/2021 1703   GFRAA >60 04/11/2020 1053    INR    Component Value Date/Time   INR 1.3 (H) 12/03/2021 1150     Intake/Output Summary (Last 24 hours) at 12/08/2021 0703 Last data filed at 12/07/2021 1820 Gross per 24 hour  Intake 360 ml  Output 850 ml  Net -490 ml     Assessment/Plan:  81 y.o. female is s/p ABF bypass with B CFA Endart 5 Days Post-Op   BLE well perfused with palpable Dp pulses Incisions are well appearing without swelling or hematoma Pain well controlled BM yesterday Advance to Soft diet today Mobilize/ OOB to chair Order placed for [facility] OT  DVT prophylaxis:  sq heparin    Karoline Caldwell,  PA-C [facility] [phone] 12/08/2021 7:03 AM  I agree with the above.  I have seen and evaluated the patient.  She is postoperative day #5.  She has palpable pedal pulses.  We are advancing her diet.  She has had a bowel movement.  Incisions are healing appropriately  Annamarie Major

## 2021-12-08 NOTE — Progress Notes (Signed)
Mobility Specialist Progress Note    12/08/21 1550  Mobility  Activity Ambulated independently in hallway  Level of Assistance Contact guard assist, steadying assist  Assistive Device None  Distance Ambulated (ft) 270 ft  Activity Response Tolerated fair  $Mobility charge 1 Mobility   Pt received in bed and agreeable. C/o being more tired than yesterday. Took x1 standing rest break. Left with call bell in reach.   [facility] Mobility Specialist  M.S. 2C and 6E: [phone] M.S. 4E: [phone]

## 2021-12-08 NOTE — Progress Notes (Signed)
Pharmacy note: pain management  81 yo female s/p ABF bypass with B CFA endarterectomy on 1/31. Pharmacy asked to assist with pain management.   Current pain regimen -Scheduled meds: Mobic 15mg /day (on PTA), gabapentin 200mg  qam/200mg  qhs (on PTA), APAP 650mg  q6h (stops 2/4) -PRN: morphine (2mg  last 24 hours), ketorolac (15mg  last 24hrs).   Last reported pain score was 0. I spoke with her this afternoon and she reported no pain.  Plan -Continue PRN APAP to start 2/4 -Will stop morphine at this time. Pain regimen can be escalated if the need arises.  Thank you for allowing pharmacy to participate in this patient's care.  Reatha Harps, PharmD PGY1 Pharmacy Resident 12/08/2021 12:12 PM Check [email or web address] for unit specific pharmacy number

## 2021-12-09 MED FILL — Heparin Sodium (Porcine) Inj 1000 Unit/ML: INTRAMUSCULAR | Qty: 30 | Status: AC

## 2021-12-09 MED FILL — Sodium Chloride IV Soln 0.9%: INTRAVENOUS | Qty: 1000 | Status: AC

## 2021-12-09 NOTE — Progress Notes (Signed)
Pharmacy note: pain management  81 yo female s/p ABF bypass with B CFA endarterectomy on 1/31. Pharmacy asked to assist with pain management.   Current pain regimen -Scheduled meds: Mobic 15mg /day (on PTA), gabapentin 200mg  qam/200mg  qhs (on PTA), APAP 650mg  q6h  -PRN: morphine (2mg  last 24 hours), ketorolac (15mg  last 24hrs).   Last reported pain score was 0. Pt sleeping  Plan -Stop scheduled APAP and use PRN only  Thank you for allowing pharmacy to participate in this patient's care.   Hildred Laser, PharmD Clinical Pharmacist **Pharmacist phone directory can now be found on [email or web address] ([email or web address]).  Listed under [place].

## 2021-12-09 NOTE — Progress Notes (Addendum)
°  Progress Note    12/09/2021 8:31 AM 6 Days Post-Op  Subjective:  Having bowel movements.  Denies rest pain BLE.   Vitals:   12/09/21 0411 12/09/21 0801  BP: (!) 114/48   Pulse: (!) 59   Resp: 14   Temp: 97.7 F (36.5 C)   SpO2: 94% 91%   Physical Exam: Lungs:  non labored Incisions:  abd and groin incisions c/d/i Extremities:  palpable DP pulses Abdomen:  soft, NT, ND Neurologic: A&O  CBC    Component Value Date/Time   WBC 8.7 12/06/2021 1703   RBC 3.18 (L) 12/06/2021 1703   HGB 10.2 (L) 12/06/2021 1703   HCT 29.2 (L) 12/06/2021 1703   PLT 147 (L) 12/06/2021 1703   MCV 91.8 12/06/2021 1703   MCH 32.1 12/06/2021 1703   MCHC 34.9 12/06/2021 1703   RDW 14.2 12/06/2021 1703   LYMPHSABS 1.1 04/04/2021 1658   MONOABS 1.0 04/04/2021 1658   EOSABS 0.0 04/04/2021 1658   BASOSABS 0.0 04/04/2021 1658    BMET    Component Value Date/Time   NA 137 12/08/2021 0913   K 3.4 (L) 12/08/2021 0913   CL 104 12/08/2021 0913   CO2 23 12/08/2021 0913   GLUCOSE 131 (H) 12/08/2021 0913   BUN 12 12/08/2021 0913   CREATININE 0.94 12/08/2021 0913   CALCIUM 8.3 (L) 12/08/2021 0913   GFRNONAA >60 12/08/2021 0913   GFRAA >60 04/11/2020 1053    INR    Component Value Date/Time   INR 1.3 (H) 12/03/2021 1150     Intake/Output Summary (Last 24 hours) at 12/09/2021 0831 Last data filed at 12/08/2021 1800 Gross per 24 hour  Intake 240 ml  Output --  Net 240 ml     Assessment/Plan:  81 y.o. female is s/p B CFA endarterectomies and ABF bypass 6 Days Post-Op   BLE well perfused with palpable DP pulses Abd and groin incisions healing well GI: tolerating regular diet; several BMs OOB; No HH recommendations by PT Home likely tomorrow   Dagoberto Ligas, PA-C [facility] [phone] 12/09/2021 8:31 AM  VASCULAR STAFF ADDENDUM: I have independently interviewed and examined the patient. I agree with the above.    [name], MD [facility] Phone Number: [phone] 12/09/2021 9:14 AM

## 2021-12-09 NOTE — Progress Notes (Signed)
Physical Therapy Treatment Patient Details Name: Elizabeth [name] MRN: [id number] DOB: May 28, 1941 Today's Date: 12/09/2021   History of Present Illness Pt is an 81 y.o. female who presented 12/03/21 for elective aortobifemoral bypass, aortic endarterectomy, and bilateral common femoral artery endarterectomy secondary to critical bil lower extremity ischemia. PMH: CAD, COPD, GERD, HTN, heart murmur, osteopenia, PVD    PT Comments    Patient progressing well with mobility. Reports feeling okay today, eager to d/c home tomorrow. Session focused on gait and balance training without use of DME. Scored 20/24 on DGI indicating pt is not at increased risk for falls. Tolerated walking backwards, stepping over objects and changes in direction without LOB or difficulty; only noted mild deviations in gait but no overt LOB. 2/4 DOE noted with activity which pt reports is new, likely due to deconditioning from hospital stay. Discussed slowing increasing activity at home. Pt is safe to d/c from a functional mobility standpoint. Recommend to continue working with mobility tech while in the hospital. All education completed and goals are met or adequate for d/c. Discharge from therapy.   Recommendations for follow up therapy are one component of a multi-disciplinary discharge planning process, led by the attending physician.  Recommendations may be updated based on patient status, additional functional criteria and insurance authorization.  Follow Up Recommendations  No PT follow up     Assistance Recommended at Discharge Intermittent Supervision/Assistance  Patient can return home with the following Assist for transportation;Help with stairs or ramp for entrance;Assistance with cooking/housework;A little help with bathing/dressing/bathroom   Equipment Recommendations  None recommended by PT    Recommendations for Other Services       Precautions / Restrictions Precautions Precautions:  Fall Restrictions Weight Bearing Restrictions: No     Mobility  Bed Mobility Overal bed mobility: Modified Independent Bed Mobility: Supine to Sit, Sit to Supine     Supine to sit: Modified independent (Device/Increase time) Sit to supine: Modified independent (Device/Increase time)   General bed mobility comments: No assist needed, no use of rail.    Transfers Overall transfer level: Needs assistance Equipment used: None Transfers: Sit to/from Stand Sit to Stand: Modified independent (Device/Increase time)           General transfer comment: Stood from EOB without difficulty, no dizziness.    Ambulation/Gait Ambulation/Gait assistance: Modified independent (Device/Increase time), Supervision Gait Distance (Feet): 200 Feet Assistive device: None Gait Pattern/deviations: Step-through pattern, Decreased stride length, Narrow base of support Gait velocity: reduced but able to increase Gait velocity interpretation: 1.31 - 2.62 ft/sec, indicative of limited community ambulator   General Gait Details: Slow, steady gait with decreased arm swing bilaterally. Tolerated higher level balance without only mild drifting but no overt LOB. Feels 80% per report. 2/4 DOE but VSS, HR 70s-112 bpm with activity.   Stairs             Wheelchair Mobility    Modified Rankin (Stroke Patients Only)       Balance Overall balance assessment: Needs assistance Sitting-balance support: Feet supported, No upper extremity supported Sitting balance-Leahy Scale: Good     Standing balance support: During functional activity Standing balance-Leahy Scale: Good                   Standardized Balance Assessment Standardized Balance Assessment : Dynamic Gait Index   Dynamic Gait Index Level Surface: Normal Change in Gait Speed: Mild Impairment Gait with Horizontal Head Turns: Normal Gait with Vertical Head Turns: Normal Gait and  Pivot Turn: Mild Impairment Step Over  Obstacle: Mild Impairment Step Around Obstacles: Normal Steps: Mild Impairment Total Score: 20      Cognition Arousal/Alertness: Awake/alert Behavior During Therapy: WFL for tasks assessed/performed Overall Cognitive Status: Within Functional Limits for tasks assessed                                 General Comments: awake and alert, still works. "my mind is not as quick as it used to be"        Exercises      General Comments General comments (skin integrity, edema, etc.): Report feeling tired post walk.      Pertinent Vitals/Pain Pain Assessment Pain Assessment: Faces Faces Pain Scale: Hurts little more Pain Location: bil feet- chronic Pain Descriptors / Indicators: Sore, Discomfort Pain Intervention(s): Monitored during session, Repositioned    Home Living                          Prior Function            PT Goals (current goals can now be found in the care plan section) Progress towards PT goals: Goals met/education completed, patient discharged from PT    Frequency    Min 3X/week      PT Plan Current plan remains appropriate    Co-evaluation              AM-PAC PT "6 Clicks" Mobility   Outcome Measure  Help needed turning from your back to your side while in a flat bed without using bedrails?: None Help needed moving from lying on your back to sitting on the side of a flat bed without using bedrails?: None Help needed moving to and from a bed to a chair (including a wheelchair)?: A Little Help needed standing up from a chair using your arms (e.g., wheelchair or bedside chair)?: A Little Help needed to walk in hospital room?: A Little Help needed climbing 3-5 steps with a railing? : A Little 6 Click Score: 20    End of Session   Activity Tolerance: Patient tolerated treatment well Patient left: in bed;with call bell/phone within reach Nurse Communication: Mobility status PT Visit Diagnosis: Unsteadiness on feet  (R26.81);Other abnormalities of gait and mobility (R26.89);Muscle weakness (generalized) (M62.81);Difficulty in walking, not elsewhere classified (R26.2);Pain Pain - Right/Left:  (bil) Pain - part of body: Ankle and joints of foot     Time: 0813-0829 PT Time Calculation (min) (ACUTE ONLY): 16 min  Charges:  $Neuromuscular Re-education: 8-22 mins                     Marisa Severin, PT, DPT Acute Rehabilitation Services Pager [phone] Office [place] 12/09/2021, 9:01 AM

## 2021-12-09 NOTE — Care Management Important Message (Signed)
Important Message  Patient Details  Name: Elizabeth [name] MRN: [id number] Date of Birth: Sep 14, 1941   Medicare Important Message Given:  Yes     Shelda Altes 12/09/2021, 9:55 AM

## 2021-12-10 NOTE — Progress Notes (Addendum)
°  Progress Note    12/10/2021 7:28 AM 7 Days Post-Op  Subjective:  ready to go home today   Vitals:   12/10/21 0334 12/10/21 0720  BP: (!) 93/50 (!) 120/48  Pulse: 69 81  Resp: 17 20  Temp: 98.4 F (36.9 C) 98.9 F (37.2 C)  SpO2: 94% 96%   Physical Exam: Lungs:  non labored Incisions:  incisions all healing well Extremities:  palpable DP pulses Abdomen:  NT, ND, soft Neurologic: A&O  CBC    Component Value Date/Time   WBC 8.7 12/06/2021 1703   RBC 3.18 (L) 12/06/2021 1703   HGB 10.2 (L) 12/06/2021 1703   HCT 29.2 (L) 12/06/2021 1703   PLT 147 (L) 12/06/2021 1703   MCV 91.8 12/06/2021 1703   MCH 32.1 12/06/2021 1703   MCHC 34.9 12/06/2021 1703   RDW 14.2 12/06/2021 1703   LYMPHSABS 1.1 04/04/2021 1658   MONOABS 1.0 04/04/2021 1658   EOSABS 0.0 04/04/2021 1658   BASOSABS 0.0 04/04/2021 1658    BMET    Component Value Date/Time   NA 137 12/08/2021 0913   K 3.4 (L) 12/08/2021 0913   CL 104 12/08/2021 0913   CO2 23 12/08/2021 0913   GLUCOSE 131 (H) 12/08/2021 0913   BUN 12 12/08/2021 0913   CREATININE 0.94 12/08/2021 0913   CALCIUM 8.3 (L) 12/08/2021 0913   GFRNONAA >60 12/08/2021 0913   GFRAA >60 04/11/2020 1053    INR    Component Value Date/Time   INR 1.3 (H) 12/03/2021 1150     Intake/Output Summary (Last 24 hours) at 12/10/2021 7341 Last data filed at 12/09/2021 1652 Gross per 24 hour  Intake 360 ml  Output --  Net 360 ml     Assessment/Plan:  81 y.o. female is s/p ABF bypass 7 Days Post-Op   BLE well perfused Tolerating a regular diet Fluid bolus overnight due to borderline hypotension; normotensive now [facility] for discharge home this morning if ambulating without difficulty   Dagoberto Ligas, PA-C [facility] [phone] 12/10/2021 7:28 AM   VASCULAR STAFF ADDENDUM: I have independently interviewed and examined the patient. I agree with the above.    [name], MD [facility]  [facility] Phone Number: [phone] 12/10/2021 8:29 AM

## 2021-12-10 NOTE — Discharge Instructions (Signed)
 [facility]   Discharge Instructions   Open Aortic Surgery  Please refer to the following instructions for your post-procedure care. Your surgeon or Physician Assistant will discuss any changes with you.  Activity  Avoid lifting more than eight pounds (a gallon of milk) until after your first post-operative visit. You are encouraged to walk as much as you can. You can slowly return to normal activities but must avoid strenuous activity and heavy lifting until your doctor tells you it's OK. Heavy lifting can hurt the incision and cause a hernia. Avoid activities such as vacuuming or swinging a golf club. It is normal to feel tired for several weeks after your surgery. Do not drive until your doctor gives the OK and you are no longer taking prescription pain medications. It is also normal to have difficulty with sleep habits, eating and bowl movements after surgery. These will go away with time.  Bathing/Showering  You may shower after you go home. Do not soak in a bathtub, hot tub, or swim until the incision heals.  Incision Care  Shower every day. Clean your incision with mild soap and water. Pat the area dry with a clean towel. You do not need a bandage unless otherwise instructed. Do not apply any ointments or creams to your incision. You may have skin glue on your incision. Do not peel it off. It will come off on its own in about one week. If you have staples or sutures along your incision, they will be removed at your post op appointment.  Diet  Resume your normal diet. There are no special food restriction following this procedure. A low fat/low cholesterol diet is recommended for all patients with vascular disease. After your aortic surgery, it's normal to feel full faster than usual and to not feel as hungry as you normally would. You will probably lose weight initially following your surgery. It's best to eat small, frequent meals over the course of  the day. Call the office if you find that you are unable to eat even small meals. In order to heal from your surgery, it is CRITICAL to get adequate nutrition. Your body requires vitamins, minerals, and protein. Vegetables are the best source of vitamins and minerals. causing pain, you may take over-the-counter pain reliever such as acetaminophen (Tylenol). If you were prescribed a stronger pain medication, please be aware these medication can cause nausea and constipation. Prevent nausea by taking the medication with a snack or meal. Avoid constipation by drinking plenty of fluids and eating foods with a high amount of fiber, such as fruits, vegetables and grains. Take 100mg of the over-the-counter stool softener Colace twice a day as needed to help with constipation. A laxative, such as Milk of Magnesia, may be recommended for you at this time. Do not take a laxative unless your surgeon or P.A. tells you it's OK. Do not take Tylenol if you are taking stronger pain medications (such as Percocet).  Follow Up  Our office will schedule a follow up appointment 2-3 weeks after discharge.  Please call us immediately for any of the following conditions    .     Severe or worsening pain in your legs or feet or in your abdomen back or chest. Increased pain, redness drainage (pus) from your incision site. Increased abdominal pain, bloating, nausea, vomiting, or persistent diarrhea. Fever of 101 degrees or higher. Swelling in your leg (s).  Reduce your risk of vascular disease  Stop smoking.   If you would like help, call [facility] at 1-800-QUIT-NOW (1-800-784-8669) or [place] at 336-586-4000. Manage your cholesterol Maintain a desired weight Control your diabetes Keep your blood pressure down  If you have any questions please call the office at [phone].   

## 2021-12-10 NOTE — Progress Notes (Signed)
Patient discharging home. IVs removed without complications. Tele removed and CCMD notified. Discharge instructions given and medication administration discussed. All questions answered. Elizabeth [name]

## 2021-12-10 NOTE — TOC Transition Note (Signed)
Transition of Care (TOC) - CM/SW Discharge Note Marvetta Gibbons RN, BSN Transitions of Care Unit 4E- RN Case Manager See Treatment Team for direct phone #    Patient Details  Name: LIANA CAMERER MRN: [id number] Date of Birth: 10-20-1941  Transition of Care [facility] CM/SW Contact:  Dawayne Patricia, RN Phone Number: 12/10/2021, 9:47 AM   Clinical Narrative:    Pt stable for transition home today, per PT notes- no f/u recommendations for home, order placed for [place]- per bedside RN- pt has been functioning independently in room with ADLs- no HH services needed at this time. Pt to return home with family.   Pt has transportation home, no DME needs.    Final next level of care: Home/Self Care Barriers to Discharge: No Barriers Identified   Patient Goals and CMS Choice Patient states their goals for this hospitalization and ongoing recovery are:: return home   Choice offered to / list presented to : NA  Discharge Placement               home        Discharge Plan and Services   Discharge Planning Services: CM Consult Post Acute Care Choice: Home Health          DME Arranged: N/A DME Agency: NA       HH Arranged: OT (pt independent in ADLs prior to discharge- no HH needed) HH Agency: NA        Social Determinants of Health (SDOH) Interventions     Readmission Risk Interventions Readmission Risk Prevention Plan 12/10/2021  Post Dischage Appt Complete  Medication Screening Complete  Transportation Screening Complete  Some recent data might be hidden

## 2021-12-11 ENCOUNTER — Encounter (HOSPITAL_COMMUNITY): Payer: Self-pay | Admitting: *Deleted

## 2021-12-11 ENCOUNTER — Inpatient Hospital Stay (HOSPITAL_COMMUNITY)
Admission: EM | Admit: 2021-12-11 | Discharge: 2021-12-19 | DRG: 439 | Disposition: A | Discharge status: Home Health | Payer: Medicare Other | Attending: Internal Medicine | Admitting: Internal Medicine

## 2021-12-11 ENCOUNTER — Emergency Department (HOSPITAL_COMMUNITY): Payer: Medicare Other

## 2021-12-11 ENCOUNTER — Other Ambulatory Visit: Payer: Self-pay

## 2021-12-11 DIAGNOSIS — Z9842 Cataract extraction status, left eye: Secondary | ICD-10-CM

## 2021-12-11 DIAGNOSIS — Z79899 Other long term (current) drug therapy: Secondary | ICD-10-CM

## 2021-12-11 DIAGNOSIS — E782 Mixed hyperlipidemia: Secondary | ICD-10-CM | POA: Diagnosis not present

## 2021-12-11 DIAGNOSIS — Z881 Allergy status to other antibiotic agents status: Secondary | ICD-10-CM | POA: Diagnosis not present

## 2021-12-11 DIAGNOSIS — R9431 Abnormal electrocardiogram [ECG] [EKG]: Secondary | ICD-10-CM | POA: Diagnosis not present

## 2021-12-11 DIAGNOSIS — Z87442 Personal history of urinary calculi: Secondary | ICD-10-CM

## 2021-12-11 DIAGNOSIS — I97638 Postprocedural hematoma of a circulatory system organ or structure following other circulatory system procedure: Secondary | ICD-10-CM | POA: Diagnosis present

## 2021-12-11 DIAGNOSIS — Z823 Family history of stroke: Secondary | ICD-10-CM | POA: Diagnosis not present

## 2021-12-11 DIAGNOSIS — Z20822 Contact with and (suspected) exposure to covid-19: Secondary | ICD-10-CM | POA: Diagnosis present

## 2021-12-11 DIAGNOSIS — I739 Peripheral vascular disease, unspecified: Secondary | ICD-10-CM | POA: Diagnosis present

## 2021-12-11 DIAGNOSIS — R1013 Epigastric pain: Secondary | ICD-10-CM | POA: Diagnosis not present

## 2021-12-11 DIAGNOSIS — Z9841 Cataract extraction status, right eye: Secondary | ICD-10-CM | POA: Diagnosis not present

## 2021-12-11 DIAGNOSIS — I959 Hypotension, unspecified: Secondary | ICD-10-CM | POA: Diagnosis not present

## 2021-12-11 DIAGNOSIS — K859 Acute pancreatitis without necrosis or infection, unspecified: Secondary | ICD-10-CM | POA: Diagnosis not present

## 2021-12-11 DIAGNOSIS — E46 Unspecified protein-calorie malnutrition: Secondary | ICD-10-CM | POA: Diagnosis present

## 2021-12-11 DIAGNOSIS — Z8249 Family history of ischemic heart disease and other diseases of the circulatory system: Secondary | ICD-10-CM

## 2021-12-11 DIAGNOSIS — I10 Essential (primary) hypertension: Secondary | ICD-10-CM | POA: Diagnosis present

## 2021-12-11 DIAGNOSIS — I248 Other forms of acute ischemic heart disease: Secondary | ICD-10-CM | POA: Diagnosis present

## 2021-12-11 DIAGNOSIS — Z87891 Personal history of nicotine dependence: Secondary | ICD-10-CM

## 2021-12-11 DIAGNOSIS — R778 Other specified abnormalities of plasma proteins: Secondary | ICD-10-CM | POA: Diagnosis present

## 2021-12-11 DIAGNOSIS — E871 Hypo-osmolality and hyponatremia: Secondary | ICD-10-CM | POA: Diagnosis not present

## 2021-12-11 DIAGNOSIS — K5730 Diverticulosis of large intestine without perforation or abscess without bleeding: Secondary | ICD-10-CM | POA: Diagnosis not present

## 2021-12-11 DIAGNOSIS — I2510 Atherosclerotic heart disease of native coronary artery without angina pectoris: Secondary | ICD-10-CM | POA: Diagnosis not present

## 2021-12-11 DIAGNOSIS — J449 Chronic obstructive pulmonary disease, unspecified: Secondary | ICD-10-CM | POA: Diagnosis present

## 2021-12-11 DIAGNOSIS — Q453 Other congenital malformations of pancreas and pancreatic duct: Secondary | ICD-10-CM

## 2021-12-11 DIAGNOSIS — Z6823 Body mass index (BMI) 23.0-23.9, adult: Secondary | ICD-10-CM

## 2021-12-11 DIAGNOSIS — R7989 Other specified abnormal findings of blood chemistry: Secondary | ICD-10-CM | POA: Diagnosis present

## 2021-12-11 DIAGNOSIS — K5900 Constipation, unspecified: Secondary | ICD-10-CM | POA: Diagnosis present

## 2021-12-11 DIAGNOSIS — Z7982 Long term (current) use of aspirin: Secondary | ICD-10-CM | POA: Diagnosis not present

## 2021-12-11 DIAGNOSIS — R609 Edema, unspecified: Secondary | ICD-10-CM | POA: Diagnosis present

## 2021-12-11 DIAGNOSIS — K8500 Idiopathic acute pancreatitis without necrosis or infection: Secondary | ICD-10-CM | POA: Diagnosis not present

## 2021-12-11 DIAGNOSIS — R188 Other ascites: Secondary | ICD-10-CM | POA: Diagnosis not present

## 2021-12-11 DIAGNOSIS — Z888 Allergy status to other drugs, medicaments and biological substances status: Secondary | ICD-10-CM | POA: Diagnosis not present

## 2021-12-11 DIAGNOSIS — K219 Gastro-esophageal reflux disease without esophagitis: Secondary | ICD-10-CM | POA: Diagnosis not present

## 2021-12-11 DIAGNOSIS — D649 Anemia, unspecified: Secondary | ICD-10-CM | POA: Diagnosis not present

## 2021-12-11 DIAGNOSIS — J410 Simple chronic bronchitis: Secondary | ICD-10-CM | POA: Diagnosis not present

## 2021-12-11 LAB — I-STAT CHEM 8, ED
BUN: 11 mg/dL (ref 8–23)
Calcium, Ion: 1.15 mmol/L (ref 1.15–1.40)
Chloride: 101 mmol/L (ref 98–111)
Creatinine, Ser: 0.9 mg/dL (ref 0.44–1.00)
Glucose, Bld: 139 mg/dL — ABNORMAL HIGH (ref 70–99)
HCT: 34 % — ABNORMAL LOW (ref 36.0–46.0)
Hemoglobin: 11.6 g/dL — ABNORMAL LOW (ref 12.0–15.0)
Potassium: 3.6 mmol/L (ref 3.5–5.1)
Sodium: 137 mmol/L (ref 135–145)
TCO2: 25 mmol/L (ref 22–32)

## 2021-12-11 LAB — CBC WITH DIFFERENTIAL/PLATELET
Abs Immature Granulocytes: 0.15 10*3/uL — ABNORMAL HIGH (ref 0.00–0.07)
Basophils Absolute: 0 10*3/uL (ref 0.0–0.1)
Basophils Relative: 0 %
Eosinophils Absolute: 0.2 10*3/uL (ref 0.0–0.5)
Eosinophils Relative: 2 %
HCT: 34.9 % — ABNORMAL LOW (ref 36.0–46.0)
Hemoglobin: 11.6 g/dL — ABNORMAL LOW (ref 12.0–15.0)
Immature Granulocytes: 1 %
Lymphocytes Relative: 10 %
Lymphs Abs: 1.2 10*3/uL (ref 0.7–4.0)
MCH: 31.5 pg (ref 26.0–34.0)
MCHC: 33.2 g/dL (ref 30.0–36.0)
MCV: 94.8 fL (ref 80.0–100.0)
Monocytes Absolute: 0.8 10*3/uL (ref 0.1–1.0)
Monocytes Relative: 7 %
Neutro Abs: 9.5 10*3/uL — ABNORMAL HIGH (ref 1.7–7.7)
Neutrophils Relative %: 80 %
Platelets: 300 10*3/uL (ref 150–400)
RBC: 3.68 MIL/uL — ABNORMAL LOW (ref 3.87–5.11)
RDW: 14.4 % (ref 11.5–15.5)
WBC: 12 10*3/uL — ABNORMAL HIGH (ref 4.0–10.5)
nRBC: 0 % (ref 0.0–0.2)

## 2021-12-11 LAB — COMPREHENSIVE METABOLIC PANEL
ALT: 32 U/L (ref 0–44)
AST: 45 U/L — ABNORMAL HIGH (ref 15–41)
Albumin: 3 g/dL — ABNORMAL LOW (ref 3.5–5.0)
Alkaline Phosphatase: 79 U/L (ref 38–126)
Anion gap: 12 (ref 5–15)
BUN: 12 mg/dL (ref 8–23)
CO2: 21 mmol/L — ABNORMAL LOW (ref 22–32)
Calcium: 8.9 mg/dL (ref 8.9–10.3)
Chloride: 101 mmol/L (ref 98–111)
Creatinine, Ser: 0.89 mg/dL (ref 0.44–1.00)
GFR, Estimated: >60 mL/min (ref >60)
Glucose, Bld: 142 mg/dL — ABNORMAL HIGH (ref 70–99)
Potassium: 3.6 mmol/L (ref 3.5–5.1)
Sodium: 134 mmol/L — ABNORMAL LOW (ref 135–145)
Total Bilirubin: 0.8 mg/dL (ref 0.3–1.2)
Total Protein: 6 g/dL — ABNORMAL LOW (ref 6.5–8.1)

## 2021-12-11 LAB — LIPASE, BLOOD: Lipase: 7346 U/L — ABNORMAL HIGH (ref 11–51)

## 2021-12-11 MED ORDER — HYDROMORPHONE HCL 1 MG/ML IJ SOLN
1.0000 mg | Freq: Once | INTRAMUSCULAR | Status: AC
  Administered 2021-12-11: 1 mg via INTRAVENOUS
  Filled 2021-12-11: qty 1

## 2021-12-11 MED ORDER — FENTANYL CITRATE PF 50 MCG/ML IJ SOSY
PREFILLED_SYRINGE | INTRAMUSCULAR | Status: AC
  Administered 2021-12-11: 50 ug via INTRAVENOUS
  Filled 2021-12-11: qty 1

## 2021-12-11 MED ORDER — ONDANSETRON HCL 4 MG/2ML IJ SOLN
INTRAMUSCULAR | Status: AC
  Administered 2021-12-11: 4 mg via INTRAVENOUS
  Filled 2021-12-11: qty 2

## 2021-12-11 MED ORDER — ONDANSETRON HCL 4 MG/2ML IJ SOLN: 4.0000 mg | Freq: Once | INTRAMUSCULAR | Status: AC

## 2021-12-11 MED ORDER — FENTANYL CITRATE PF 50 MCG/ML IJ SOSY
50.0000 ug | PREFILLED_SYRINGE | Freq: Once | INTRAMUSCULAR | Status: AC
  Filled 2021-12-11: qty 1

## 2021-12-11 MED ORDER — IOHEXOL 350 MG/ML SOLN
100.0000 mL | Freq: Once | INTRAVENOUS | Status: AC | PRN
  Administered 2021-12-11: 100 mL via INTRAVENOUS

## 2021-12-11 MED ORDER — LACTATED RINGERS IV BOLUS
1000.0000 mL | Freq: Once | INTRAVENOUS | Status: AC
  Administered 2021-12-11: 1000 mL via INTRAVENOUS

## 2021-12-11 NOTE — ED Provider Notes (Signed)
[facility] Emergency Department Provider Note MRN:  [id number]  Arrival date & time: 12/11/21     Chief Complaint   Abdominal Pain   History of Present Illness   Elizabeth [name] is a 81 y.o. year-old female presents to the ED with chief complaint of abdominal pain.  She states that the onset was about 2pm this afternoon.  Pain has been progressively worsening.  She states that it radiates to her back.  She recently underwent aortobifemoral bypass on 1/31 with Dr. Virl Cagey.  She states that the pain feels similar to prior pancreatitis.  She has had associated vomiting.    Review of Systems  Pertinent review of systems noted in HPI.    Physical Exam   Vitals:   12/11/21 2048  BP: 114/61  Pulse: 92  Resp: (!) 27  Temp: 97.7 F (36.5 C)  SpO2: 98%    CONSTITUTIONAL:  uncomfortable-appearing, NAD NEURO:  Alert and oriented x 3, CN 3-12 grossly intact EYES:  eyes equal and reactive ENT/NECK:  Supple, no stridor  CARDIO:  normal rate, regular regular rhythm, appears well-perfused, bilateral DP pulses are intact and marked PULM:  No respiratory distress, lungs are clear GI/GU:  non-distended, but tender to palpation, midline abdominal incision is CDI MSK/SPINE:  No gross deformities, no edema, moves all extremities  SKIN:  no rash, atraumatic   *Additional and/or pertinent findings included in MDM below  Diagnostic and Interventional Summary    EKG Interpretation  Date/Time:  Wednesday December 11 2021 20:40:57 EST Ventricular Rate:  96 PR Interval:  156 QRS Duration: 82 QT Interval:  368 QTC Calculation: 464 R Axis:   72 Text Interpretation: Normal sinus rhythm Biatrial enlargement Nonspecific ST abnormality Abnormal ECG When compared with ECG of 03-Dec-2021 12:13, PREVIOUS ECG IS PRESENT Confirmed by Gerlene Fee [phone]) on 12/11/2021 11:05:24 PM       Labs Reviewed  CBC WITH DIFFERENTIAL/PLATELET - Abnormal; Notable for the following  components:      Result Value   WBC 12.0 (*)    RBC 3.68 (*)    Hemoglobin 11.6 (*)    HCT 34.9 (*)    Neutro Abs 9.5 (*)    Abs Immature Granulocytes 0.15 (*)    All other components within normal limits  COMPREHENSIVE METABOLIC PANEL - Abnormal; Notable for the following components:   Sodium 134 (*)    CO2 21 (*)    Glucose, Bld 142 (*)    Total Protein 6.0 (*)    Albumin 3.0 (*)    AST 45 (*)    All other components within normal limits  I-STAT CHEM 8, ED - Abnormal; Notable for the following components:   Glucose, Bld 139 (*)    Hemoglobin 11.6 (*)    HCT 34.0 (*)    All other components within normal limits  LIPASE, BLOOD  URINALYSIS, ROUTINE W REFLEX MICROSCOPIC    CT Angio Chest/Abd/Pel for Dissection W and/or W/WO    (Results Pending)    Medications  fentaNYL (SUBLIMAZE) injection 50 mcg (has no administration in time range)  HYDROmorphone (DILAUDID) injection 1 mg (1 mg Intravenous Given 12/11/21 2139)  ondansetron (ZOFRAN) injection 4 mg (4 mg Intravenous Given 12/11/21 2139)     Procedures  /  Critical Care Procedures  ED Course and Medical Decision Making  I have reviewed the triage vital signs, the nursing notes, and pertinent available records from the EMR.  Complexity of Problems Addressed Acute illness or injury that  poses threat of life of bodily function  Additional Data Reviewed and Analyzed Further history obtained from: Further history from spouse/family member, Past medical history and medications listed in the EMR, Recent discharge summary, Recent Consult notes, Care Everywhere, and Prior labs/imaging results    ED Course    I personally reviewed the labs, CT, and EKG.  Patient is here with epigastric abdominal pain with associated nausea and vomiting that radiates to her back.  CT imaging was ordered due to her recent surgery of aortobifemoral bypass graft.  CT shows widely patent graft.  There is a rounded masslike structure that is noted  which is thought to be postoperative fluid collection or hematoma.  I discussed this result with Dr. Cyd Silence, hospitalist, who states that he will add on blood cultures and inflammatory markers, they can follow-up with vascular surgery in the morning if needed.  Patient symptoms are more consistent with pancreatitis, and I believe that this is what is causing her pain tonight.  Her lipase is significantly elevated 7346.  She has had associated nausea and vomiting, and the pain radiates into her back.  She has had pancreatitis before, and states this feels similar.  Patient is treated with IV pain medication, fentanyl and hydromorphone with some improvement.  I ordered lactated Ringer's.    I consulted with Dr. Cyd Silence, hospitalist, who is appreciated for admitting the patient.     Final Clinical Impressions(s) / ED Diagnoses     ICD-10-CM   1. Acute pancreatitis, unspecified complication status, unspecified pancreatitis type  K85.90       ED Discharge Orders     None        Discharge Instructions Discussed with and Provided to Patient:   Discharge Instructions   None      Montine Circle, PA-C 12/12/21 0019    Maudie Flakes, MD 12/12/21 [phone]

## 2021-12-11 NOTE — ED Notes (Signed)
Transported to CT 

## 2021-12-11 NOTE — Discharge Summary (Signed)
Aorta Discharge Summary    Elizabeth [name] September 25, 1941 81 y.o. female  [id number]  Admission Date: 12/03/2021  Discharge Date: 12/10/21  Physician: Dr. Virl Cagey  Admission Diagnosis: PVD (peripheral vascular disease) [facility]) [I73.9] Abdominal aortic stenosis [Q25.1]  Discharge Day services:   See progress note 12/10/2021   Hospital Course:  Elizabeth [name] is a 81 year old female who was brought in as an outpatient underwent aortobifemoral bypass with aortic endarterectomy and bilateral common femoral artery endarterectomies by Dr. Virl Cagey on 12/03/2021.  She was admitted to the ICU postoperatively where she stayed for about 24 to 36 hours.  Most of her hospital stay consisted of pain control, increasing mobility, and titrating diet.  She excelled with physical therapy and thus no recommendations were made for home health.  At the time of discharge she had symmetrical DP pulses.  Throughout the course of her hospital stay her abdomen and groin incisions remain clean, dry, and intact.  She refused prescription for narcotic pain medication at discharge.  She will follow-up in office in about 2 to 3 weeks for incision check.  Discharge instructions were reviewed with the patient and she voiced her understanding.  She was discharged home in stable condition.  CBC    Component Value Date/Time   WBC 8.7 12/06/2021 1703   RBC 3.18 (L) 12/06/2021 1703   HGB 10.2 (L) 12/06/2021 1703   HCT 29.2 (L) 12/06/2021 1703   PLT 147 (L) 12/06/2021 1703   MCV 91.8 12/06/2021 1703   MCH 32.1 12/06/2021 1703   MCHC 34.9 12/06/2021 1703   RDW 14.2 12/06/2021 1703   LYMPHSABS 1.1 04/04/2021 1658   MONOABS 1.0 04/04/2021 1658   EOSABS 0.0 04/04/2021 1658   BASOSABS 0.0 04/04/2021 1658    BMET    Component Value Date/Time   NA 137 12/08/2021 0913   K 3.4 (L) 12/08/2021 0913   CL 104 12/08/2021 0913   CO2 23 12/08/2021 0913   GLUCOSE 131 (H) 12/08/2021 0913   BUN 12 12/08/2021 0913   CREATININE 0.94  12/08/2021 0913   CALCIUM 8.3 (L) 12/08/2021 0913   GFRNONAA >60 12/08/2021 0913   GFRAA >60 04/11/2020 1053       Discharge Diagnosis:  PVD (peripheral vascular disease) ([place]) [I73.9] Abdominal aortic stenosis [Q25.1]  Secondary Diagnosis: Patient Active Problem List   Diagnosis Date Noted   PVD (peripheral vascular disease) ([place]) 12/03/2021   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   Community acquired pneumonia 04/04/2021   Sepsis ([place]) 04/04/2021   Hyponatremia 04/04/2021   Gastro-esophageal reflux disease without esophagitis 04/04/2021   Mixed hyperlipidemia 04/04/2021   Acute pancreatitis 11/17/2020   COPD (chronic obstructive pulmonary disease) ([place])    Hypertension    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   Past Medical History:  Diagnosis Date   Abdominal cramping    possibly irritable bowel syndrome versus adhesions secondary to multiple surgeries and abdominal inflammation and 2009 secondary to diverticulitis and diverticular phlegmon   Allergic rhinitis    Arthritis    Carotid artery disease (HCC)    41-74% RICA, < 08% LICA 14/4/81 [name] ([facility])   Cataract    Chest pain syndrome 2004   with adenosine Cardiolite that was normal   Chronic hoarseness    COPD (chronic obstructive pulmonary disease) (HCC)    Dyspnea    GERD (gastroesophageal reflux disease)    Heart murmur    Mild AS, AI. AV pk grad 18.79 mmHg, mn grad 11.20 mmHg, AVA (  VTI) 1.05 cm2 10/10/20, 3 year f/u rec ([facility])   History of kidney stones    History of renal calculi    Hypercholesterolemia    Hypertension    Incontinence    Cough incontinence   Mild depression    Neuromuscular disorder (HCC)    neuropathy bilateral lower extremities   Osteopenia    Peripheral vascular disease ([place])    with femoral and carotid bruits   PONV (postoperative nausea and vomiting)    Tobacco dependence    Vitamin D deficiency      Allergies as of 12/10/2021        Reactions   Other Other (See Comments)   Chlorexolone - unknown reaction   Pollen Extract    Allergic to tree pollens   Tegretol [carbamazepine] Swelling   Lisinopril Rash   Tetracyclines & Related Rash        Medication List     TAKE these medications    amLODipine 5 MG tablet Commonly known as: NORVASC Take 5 mg by mouth at bedtime.   aspirin EC 81 MG tablet Take 81 mg by mouth at bedtime. Swallow whole.   b complex vitamins capsule Take 1 capsule by mouth daily.   cloNIDine 0.1 MG tablet Commonly known as: CATAPRES Take 0.2 mg by mouth at bedtime.   Coenzyme Q10 100 MG capsule Take 100 mg by mouth every morning.   famotidine 20 MG tablet Commonly known as: PEPCID Take 20-40 mg by mouth daily as needed for heartburn.   gabapentin 100 MG capsule Commonly known as: NEURONTIN Take 2 capsules (200 mg total) by mouth at bedtime. PATIENT NEEDS OFFICE VISIT FOR ADDITIONAL REFILLS What changed:  how much to take when to take this additional instructions   ICY HOT EX Apply 1 application topically daily as needed (pain).   Incruse Ellipta 62.5 MCG/ACT Aepb Generic drug: umeclidinium bromide Inhale 1 puff into the lungs daily.   irbesartan-hydrochlorothiazide 150-12.5 MG tablet Commonly known as: AVALIDE Take 1 tablet by mouth every morning.   meloxicam 15 MG tablet Commonly known as: MOBIC Take 15 mg by mouth at bedtime.   metoprolol succinate 25 MG 24 hr tablet Commonly known as: TOPROL-XL Take 25 mg by mouth at bedtime.   multivitamin with minerals Tabs tablet Take 1 tablet by mouth 2 (two) times daily.   simvastatin 40 MG tablet Commonly known as: ZOCOR Take 40 mg by mouth at bedtime.   TURMERIC CURCUMIN PO Take 1 capsule by mouth daily. With Ginger   VITAMIN D PO Take 1 capsule by mouth daily.   VITAMIN E PO Take 1 capsule by mouth daily.        Instructions:  [facility] Discharge Instructions Open  Aortic Surgery  Please refer to the following instructions for your post-procedure care. Your surgeon or Physician Assistant will discuss any changes with you.  Activity  Avoid lifting more than eight pounds (a gallon of milk) until after your first post-operative visit. You are encouraged to walk as much as you can. You can slowly return to normal activities but must avoid strenuous activity and heavy lifting until your doctor tells you it's OK. Heavy lifting can hurt the incision and cause a hernia. Avoid activities such as vacuuming or swinging a golf club. It is normal to feel tired for several weeks after your surgery. Do not drive until your doctor gives the OK and you are no longer taking prescription pain medications. It is  also normal to have difficulty with sleep habits, eating and bowl movements after surgery. These will go away with time.  Bathing/Showering  You may shower after you go home. Do not soak in a bathtub, hot tub, or swim until the incision heals.  Incision Care  Shower every day. Clean your incision with mild soap and water. Pat the area dry with a clean towel. You do not need a bandage unless otherwise instructed. Do not apply any ointments or creams to your incision. You may have skin glue on your incision. Do not peel it off. It will come off on its own in about one week. If you have staples or sutures along your incision, they will be removed at your post op appointment.  If you have groin incisions, wash the groin wounds with soap and water daily and pat dry. (No tub bath-only shower)  Then put a dry gauze or washcloth in the groin to keep this area dry to help prevent wound infection.  Do this daily and as needed.  Do not use Vaseline or neosporin on your incisions.  Only use soap and water on your incisions and then protect and keep dry.  Diet  Resume your normal diet. There are no special food restriction following this procedure. A low fat/low cholesterol diet is  recommended for all patients with vascular disease. After your aortic surgery, it's normal to feel full faster than usual and to not feel as hungry as you normally would. You will probably lose weight initially following your surgery. It's best to eat small, frequent meals over the course of the day. Call the office if you find that you are unable to eat even small meals. In order to heal from your surgery, it is CRITICAL to get adequate nutrition. Your body requires vitamins, minerals, and protein. Vegetables are the best source of vitamins and minerals. causing pain, you may take over-the-counter pain reliever such as acetaminophen (Tylenol). If you were prescribed a stronger pain medication, please be aware these medication can cause nausea and constipation. Prevent nausea by taking the medication with a snack or meal. Avoid constipation by drinking plenty of fluids and eating foods with a high amount of fiber, such as fruits, vegetables and grains. Take 100mg  of the over-the-counter stool softener Colace twice a day as needed to help with constipation. A laxative, such as Milk of Magnesia, may be recommended for you at this time. Do not take a laxative unless your surgeon or Physician Assistant. tells you it's OK. Do not take Tylenol if you are taking stronger pain medications (such as Percocet).  Follow Up  Our office will schedule a follow up appointment 2-3 weeks after discharge.  Please call us immediately for any of the following conditions    .     Severe or worsening pain in your legs or feet or in your abdomen back or chest. Increased pain, redness drainage (pus) from your incision site. Increased abdominal pain, bloating, nausea, vomiting, or persistent diarrhea. Fever of 101 degrees or higher. Swelling in your leg (s).  Reduce your risk of vascular disease  Stop smoking. If you would like help, call [facility] at 1-800-QUIT-NOW 8630216612) or [place] at (213) 458-3316. Manage  your cholesterol Maintain a desired weight Control your diabetes Keep your blood pressure down  If you have any questions please call the office at [phone].   Disposition: home  Patient's condition: is Good  Follow up: 1. Dr. Virl Cagey in 2 weeks   Rodman Key  Jordy Verba, PA-C [facility] [phone] 12/11/2021  11:11 AM   - For VQI Registry use -  Post-op:  Time to Extubation: [x]  In OR, [ ]  < 12 hrs, [ ]  12-24 hrs, [ ]  >=24 hrs Vasopressors Req. Post-op: No ICU Stay: 1 day in ICU Transfusion: No    MI: No, [ ]  Troponin only, [ ]  EKG or Clinical New Arrhythmia: No  Complications: CHF: No Resp failure: No, [ ]  Pneumonia, [ ]  Ventilator Chg in renal function: No, [ ]  Inc. Cr > 0.5, [ ]  Temp. Dialysis, [ ]  Permanent dialysis Leg ischemia: No, no Surgery needed, [ ]  Yes, Surgery needed, [ ]  Amputation Bowel ischemia: No, [ ]  Medical Rx, [ ]  Surgical Rx Wound complication: No, [ ]  Superficial separation/infection, [ ]  Return to OR Return to OR: No  Return to OR for bleeding: No Stroke: No, [ ]  Minor, [ ]  Major  Discharge medications: Statin use:  Yes  ASA use:  Yes   Plavix use:  No  Beta blocker use:  Yes  ACEI use:  No ARB use:  Yes CCB use:  Yes Coumadin use:  No

## 2021-12-11 NOTE — ED Triage Notes (Signed)
Pt reported that she started with left sided abdominal pain  suddenly that moves across her abdomen. Vomiting episodes.  Pt had aoritc surgery approx 1 week ago. Incision to the abdomen, glue intact, no redness, drainage. Pt is guarding, anxious in triage.

## 2021-12-11 NOTE — ED Provider Triage Note (Signed)
Emergency Medicine Provider Triage Evaluation Note  Elizabeth [name] , a 81 y.o. female  was evaluated in triage.  Pt complains of abdominal pain since today.  Patient recently underwent aortobifemoral bypass with aortic endarterectomy and bilateral common femoral artery endarterectomies on 1/31. Patient now has diffuse abdominal pain.   Review of Systems  Positive: Abdominal pain, nausea, vomiting Negative: Chest pain, shortness of breath  Physical Exam  BP 114/61 (BP Location: Right Arm)    Pulse 92    Temp 97.7 F (36.5 C) (Oral)    Resp (!) 27    SpO2 98%  Gen:   Awake, no distress   Resp:  Normal effort  MSK:   Moves extremities without difficulty  Other:  Patient wailing and pain  Medical Decision Making  Medically screening exam initiated at 9:27 PM.  Appropriate orders placed.  Cooper Stamp Mori was informed that the remainder of the evaluation will be completed by another provider, this initial triage assessment does not replace that evaluation, and the importance of remaining in the ED until their evaluation is complete.     Azucena Cecil, PA-C 12/11/21 2128

## 2021-12-12 ENCOUNTER — Encounter (HOSPITAL_COMMUNITY): Payer: Self-pay | Admitting: Internal Medicine

## 2021-12-12 DIAGNOSIS — I739 Peripheral vascular disease, unspecified: Secondary | ICD-10-CM

## 2021-12-12 DIAGNOSIS — I10 Essential (primary) hypertension: Secondary | ICD-10-CM

## 2021-12-12 DIAGNOSIS — R188 Other ascites: Secondary | ICD-10-CM

## 2021-12-12 DIAGNOSIS — J410 Simple chronic bronchitis: Secondary | ICD-10-CM

## 2021-12-12 DIAGNOSIS — R778 Other specified abnormalities of plasma proteins: Secondary | ICD-10-CM

## 2021-12-12 DIAGNOSIS — K8500 Idiopathic acute pancreatitis without necrosis or infection: Secondary | ICD-10-CM

## 2021-12-12 DIAGNOSIS — E782 Mixed hyperlipidemia: Secondary | ICD-10-CM

## 2021-12-12 DIAGNOSIS — K219 Gastro-esophageal reflux disease without esophagitis: Secondary | ICD-10-CM

## 2021-12-12 LAB — URINALYSIS, ROUTINE W REFLEX MICROSCOPIC
Bilirubin Urine: NEGATIVE
Glucose, UA: NEGATIVE mg/dL
Hgb urine dipstick: NEGATIVE
Ketones, ur: NEGATIVE mg/dL
Leukocytes,Ua: NEGATIVE
Nitrite: POSITIVE — AB
Protein, ur: NEGATIVE mg/dL
Specific Gravity, Urine: 1.01 (ref 1.005–1.030)
pH: 6 (ref 5.0–8.0)

## 2021-12-12 LAB — CBC WITH DIFFERENTIAL/PLATELET
Abs Immature Granulocytes: 0.11 10*3/uL — ABNORMAL HIGH (ref 0.00–0.07)
Basophils Absolute: 0.1 10*3/uL (ref 0.0–0.1)
Basophils Relative: 0 %
Eosinophils Absolute: 0.4 10*3/uL (ref 0.0–0.5)
Eosinophils Relative: 3 %
HCT: 32.7 % — ABNORMAL LOW (ref 36.0–46.0)
Hemoglobin: 10.8 g/dL — ABNORMAL LOW (ref 12.0–15.0)
Immature Granulocytes: 1 %
Lymphocytes Relative: 10 %
Lymphs Abs: 1.2 10*3/uL (ref 0.7–4.0)
MCH: 31.9 pg (ref 26.0–34.0)
MCHC: 33 g/dL (ref 30.0–36.0)
MCV: 96.5 fL (ref 80.0–100.0)
Monocytes Absolute: 1.1 10*3/uL — ABNORMAL HIGH (ref 0.1–1.0)
Monocytes Relative: 9 %
Neutro Abs: 9.8 10*3/uL — ABNORMAL HIGH (ref 1.7–7.7)
Neutrophils Relative %: 77 %
Platelets: 266 10*3/uL (ref 150–400)
RBC: 3.39 MIL/uL — ABNORMAL LOW (ref 3.87–5.11)
RDW: 14.5 % (ref 11.5–15.5)
WBC: 12.6 10*3/uL — ABNORMAL HIGH (ref 4.0–10.5)
nRBC: 0 % (ref 0.0–0.2)

## 2021-12-12 LAB — TROPONIN I (HIGH SENSITIVITY)
Troponin I (High Sensitivity): 41 ng/L — ABNORMAL HIGH (ref <18)
Troponin I (High Sensitivity): 36 ng/L — ABNORMAL HIGH (ref <18)

## 2021-12-12 LAB — RESP PANEL BY RT-PCR (FLU A&B, COVID) ARPGX2
Influenza A by PCR: NEGATIVE
Influenza B by PCR: NEGATIVE
SARS Coronavirus 2 by RT PCR: NEGATIVE

## 2021-12-12 LAB — URINALYSIS, MICROSCOPIC (REFLEX)

## 2021-12-12 LAB — COMPREHENSIVE METABOLIC PANEL
ALT: 24 U/L (ref 0–44)
AST: 28 U/L (ref 15–41)
Albumin: 2.6 g/dL — ABNORMAL LOW (ref 3.5–5.0)
Alkaline Phosphatase: 69 U/L (ref 38–126)
Anion gap: 8 (ref 5–15)
BUN: 9 mg/dL (ref 8–23)
CO2: 27 mmol/L (ref 22–32)
Calcium: 8.4 mg/dL — ABNORMAL LOW (ref 8.9–10.3)
Chloride: 100 mmol/L (ref 98–111)
Creatinine, Ser: 0.89 mg/dL (ref 0.44–1.00)
GFR, Estimated: >60 mL/min (ref >60)
Glucose, Bld: 115 mg/dL — ABNORMAL HIGH (ref 70–99)
Potassium: 4.2 mmol/L (ref 3.5–5.1)
Sodium: 135 mmol/L (ref 135–145)
Total Bilirubin: 0.8 mg/dL (ref 0.3–1.2)
Total Protein: 5.3 g/dL — ABNORMAL LOW (ref 6.5–8.1)

## 2021-12-12 LAB — MAGNESIUM: Magnesium: 1.4 mg/dL — ABNORMAL LOW (ref 1.7–2.4)

## 2021-12-12 LAB — LIPASE, BLOOD: Lipase: 422 U/L — ABNORMAL HIGH (ref 11–51)

## 2021-12-12 LAB — TRIGLYCERIDES: Triglycerides: 118 mg/dL (ref <150)

## 2021-12-12 LAB — C-REACTIVE PROTEIN: CRP: 18.3 mg/dL — ABNORMAL HIGH (ref <1.0)

## 2021-12-12 LAB — PROCALCITONIN: Procalcitonin: <0.1 ng/mL

## 2021-12-12 MED ORDER — GABAPENTIN 100 MG PO CAPS
100.0000 mg | ORAL_CAPSULE | Freq: Every day | ORAL | Status: DC
  Administered 2021-12-12 – 2021-12-18 (×7): 100 mg via ORAL
  Filled 2021-12-12 (×7): qty 1

## 2021-12-12 MED ORDER — IPRATROPIUM-ALBUTEROL 0.5-2.5 (3) MG/3ML IN SOLN: 3.0000 mL | RESPIRATORY_TRACT | Status: DC | PRN

## 2021-12-12 MED ORDER — CLONIDINE HCL 0.2 MG PO TABS
0.2000 mg | ORAL_TABLET | Freq: Every day | ORAL | Status: DC
  Administered 2021-12-12 – 2021-12-18 (×7): 0.2 mg via ORAL
  Filled 2021-12-12 (×7): qty 1

## 2021-12-12 MED ORDER — HYDRALAZINE HCL 20 MG/ML IJ SOLN: 10.0000 mg | Freq: Four times a day (QID) | INTRAMUSCULAR | Status: DC | PRN

## 2021-12-12 MED ORDER — ACETAMINOPHEN 325 MG PO TABS
650.0000 mg | ORAL_TABLET | Freq: Four times a day (QID) | ORAL | Status: DC | PRN
  Administered 2021-12-13 – 2021-12-17 (×5): 650 mg via ORAL
  Filled 2021-12-12 (×6): qty 2

## 2021-12-12 MED ORDER — METOPROLOL SUCCINATE ER 25 MG PO TB24
25.0000 mg | ORAL_TABLET | Freq: Every day | ORAL | Status: DC
  Administered 2021-12-12 – 2021-12-18 (×7): 25 mg via ORAL
  Filled 2021-12-12 (×7): qty 1

## 2021-12-12 MED ORDER — FENTANYL CITRATE PF 50 MCG/ML IJ SOSY: 25.0000 ug | PREFILLED_SYRINGE | INTRAMUSCULAR | Status: DC | PRN

## 2021-12-12 MED ORDER — SIMVASTATIN 20 MG PO TABS
40.0000 mg | ORAL_TABLET | Freq: Every day | ORAL | Status: DC
  Administered 2021-12-12: 40 mg via ORAL
  Filled 2021-12-12: qty 2

## 2021-12-12 MED ORDER — ONDANSETRON HCL 4 MG PO TABS
4.0000 mg | ORAL_TABLET | Freq: Four times a day (QID) | ORAL | Status: DC | PRN
  Administered 2021-12-13 – 2021-12-18 (×2): 4 mg via ORAL
  Filled 2021-12-12 (×2): qty 1

## 2021-12-12 MED ORDER — ONDANSETRON HCL 4 MG/2ML IJ SOLN
4.0000 mg | Freq: Four times a day (QID) | INTRAMUSCULAR | Status: DC | PRN
  Administered 2021-12-12 – 2021-12-15 (×3): 4 mg via INTRAVENOUS
  Filled 2021-12-12 (×3): qty 2

## 2021-12-12 MED ORDER — ENOXAPARIN SODIUM 30 MG/0.3ML IJ SOSY
30.0000 mg | PREFILLED_SYRINGE | INTRAMUSCULAR | Status: DC
  Administered 2021-12-12 – 2021-12-17 (×6): 30 mg via SUBCUTANEOUS
  Filled 2021-12-12 (×6): qty 0.3

## 2021-12-12 MED ORDER — ASPIRIN EC 81 MG PO TBEC
81.0000 mg | DELAYED_RELEASE_TABLET | Freq: Every day | ORAL | Status: DC
  Administered 2021-12-12 – 2021-12-18 (×7): 81 mg via ORAL
  Filled 2021-12-12 (×7): qty 1

## 2021-12-12 MED ORDER — FENTANYL CITRATE PF 50 MCG/ML IJ SOSY
50.0000 ug | PREFILLED_SYRINGE | INTRAMUSCULAR | Status: DC | PRN
  Administered 2021-12-12 (×4): 50 ug via INTRAVENOUS
  Filled 2021-12-12 (×4): qty 1

## 2021-12-12 MED ORDER — AMLODIPINE BESYLATE 5 MG PO TABS
5.0000 mg | ORAL_TABLET | Freq: Every day | ORAL | Status: DC
  Administered 2021-12-12: 5 mg via ORAL
  Filled 2021-12-12: qty 1

## 2021-12-12 MED ORDER — LACTATED RINGERS IV SOLN: INTRAVENOUS | Status: DC

## 2021-12-12 MED ORDER — IRBESARTAN 75 MG PO TABS
150.0000 mg | ORAL_TABLET | Freq: Every day | ORAL | Status: DC
  Administered 2021-12-13: 150 mg via ORAL
  Filled 2021-12-12: qty 1
  Filled 2021-12-12: qty 2
  Filled 2021-12-12: qty 1

## 2021-12-12 MED ORDER — UMECLIDINIUM BROMIDE 62.5 MCG/ACT IN AEPB
1.0000 | INHALATION_SPRAY | Freq: Every day | RESPIRATORY_TRACT | Status: DC
  Administered 2021-12-13 – 2021-12-19 (×7): 1 via RESPIRATORY_TRACT
  Filled 2021-12-12 (×2): qty 7

## 2021-12-12 MED ORDER — PANTOPRAZOLE SODIUM 40 MG IV SOLR
40.0000 mg | INTRAVENOUS | Status: DC
  Administered 2021-12-13 – 2021-12-16 (×4): 40 mg via INTRAVENOUS
  Filled 2021-12-12 (×4): qty 10

## 2021-12-12 MED ORDER — ACETAMINOPHEN 650 MG RE SUPP: 650.0000 mg | Freq: Four times a day (QID) | RECTAL | Status: DC | PRN

## 2021-12-12 MED ORDER — POLYETHYLENE GLYCOL 3350 17 G PO PACK
17.0000 g | PACK | Freq: Every day | ORAL | Status: DC | PRN
  Administered 2021-12-14: 17 g via ORAL
  Filled 2021-12-12: qty 1

## 2021-12-12 MED ORDER — HYDROMORPHONE HCL 1 MG/ML IJ SOLN
1.0000 mg | INTRAMUSCULAR | Status: DC | PRN
  Administered 2021-12-12 – 2021-12-16 (×7): 1 mg via INTRAVENOUS
  Filled 2021-12-12 (×7): qty 1

## 2021-12-12 MED ORDER — GABAPENTIN 100 MG PO CAPS
200.0000 mg | ORAL_CAPSULE | Freq: Every day | ORAL | Status: DC
  Administered 2021-12-12 – 2021-12-18 (×7): 200 mg via ORAL
  Filled 2021-12-12 (×7): qty 2

## 2021-12-12 NOTE — Assessment & Plan Note (Addendum)
·   Patient with known history of pancreatic divisum and episodes of acute pancreatitis secondary to this in the past presenting with epigastric and left upper quadrant pain  Markedly elevated lipase suggestive of pancreatitis  Presentation complicated by recent hospitalization for vascular surgery 1/31 until 2/7 initially obscuring diagnosis  Abdominal examination is certainly complicated by the fact the patient has multiple abdominal surgical incisions that are additionally tender.  N.p.o. except for ice chips and meds  Intravenous fluid resuscitation with isotonic fluids  As needed opiate-based analgesics for associated pain  ER provider obtain triglyceride level which is normal.  If patient fails to clinically improve in the next several days will consider GI consultation.

## 2021-12-12 NOTE — ED Notes (Signed)
Admitting at bedside 

## 2021-12-12 NOTE — Assessment & Plan Note (Signed)
·   Incidental identification of a 4.7 x 3.0 cm left retroperitoneal fluid collection.  No evidence of contrast in this fluid collection making this unlikely to be an active bleed.    An abscess was also considered but felt to be unlikely in the absence of fever and only a mild leukocytosis  This is felt to most likely be a postoperative fluid collection or hematoma and should be self-limiting.  Obtaining procalcitonin, CRP, blood cultures.  If patient exhibits evidence of worsening infection/does not clinically improve with conservative measures for pancreatitis as noted above we will reconsider that this could be an infectious process and discuss further with vascular surgery and/or interventional radiology for potential drainage as well as initiation of antibiotics

## 2021-12-12 NOTE — Assessment & Plan Note (Signed)
·   Complicated history of peripheral vascular disease status post recent aortobifemoral bypass with aortic endarterectomy and bilateral common femoral endarterectomies by Dr. Unk Lightning on 1/31  We will involve vascular surgery if fluid collection in the retroperitoneal space is felt to be problematic/infectious at this time and consultation does not seem to be necessary  Continue outpatient follow-up with Dr. Unk Lightning

## 2021-12-12 NOTE — Progress Notes (Signed)
°  Daily Progress Note  S/p: ABF  Patient presented to the hospital overnight with new onset abdominal pain.  This appears to be pancreatitis as demonstrated by CT scan. On exam, continue to have abdominal pain, slightly better than earlier   CT scan reviewed demonstrating small area of hematoma, this is not surprising post aortobifemoral bypass.  The CT demonstrated no vascular concerns.  Ahmari's feet continue to improve, she has good perfusion distally. Currently being treated for pancreatitis.  I will continue to follow peripherally.  I called and updated her son regarding the above.   [name] MD MS [facility] [phone] 12/12/2021  10:10 AM

## 2021-12-12 NOTE — Progress Notes (Signed)
PROGRESS NOTE    Elizabeth [name]  ZSM:[id number] DOB: December 02, 1940 DOA: 12/11/2021 PCP: [name], MD    Chief Complaint  Patient presents with   Abdominal Pain    Brief Narrative:   This is an oh charge note as patient was admitted earlier today by Dr. Cyd Silence, patient was seen and examined, chart, imaging and labs were reviewed.   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 emergency department with complaints of left-sided abdominal pain.   Of note, patient was recently hospitalized at [facility] from 1/31 until 2/7 undergoing aorto bifemoral bypass with aortic endarterectomy and bilateral common femoral endarterectomies by Dr. Unk Lightning on 1/31.  Patient slowly improved postoperatively without complication and was eventually discharged on 2/7 home.   Patient now complains of new onset abdominal pain.  Patient describes this pain as severe in intensity, lasting approximately 1 day, radiating from the left sided and epigastric regions to the back.  Pain is worse with movement and improved with rest.  Pain is associated with several episodes of nonbilious nonbloody vomiting.  Patient denies fevers, sick contacts, recent travel, contact with confirmed COVID-19 infection or recent ingestion of undercooked food.  The patient's progressively worsening symptoms she eventually presented to [facility] Emergency Department for evaluation.   Upon evaluation in the emergency department due to recent vascular interventions CT angiogram of the chest, abdomen and pelvis was performed which revealed no complication of the recent procedure.  He did identify a incidental left retroperitoneal fluid collection measuring 4.7 x 3.0 cm.  Patient was also found to have a markedly elevated lipase of 7346 concerning for  acute pancreatitis.  Patient was given 1 L of lactated Ringer solution as well as several doses of opiate-based analgesics and the hospitalist group was then called to assess the patient for admission to the hospital.     Assessment & Plan:   Principal Problem:   Acute pancreatitis Active Problems:   COPD (chronic obstructive pulmonary disease) (HCC)   Essential hypertension   Gastro-esophageal reflux disease without esophagitis   Mixed hyperlipidemia   PVD (peripheral vascular disease) (HCC)   Retroperitoneal fluid collection   Elevated troponin level not due myocardial infarction   Acute pancreatitis- (present on admission) - Patient with known history of pancreatic divisum and episodes of acute pancreatitis secondary to this in the past presenting with epigastric and left upper quadrant pain - Markedly elevated lipase suggestive of pancreatitis - Presentation complicated by recent hospitalization for vascular surgery 1/31 until 2/7 initially obscuring diagnosis - Abdominal examination is certainly complicated by the fact the patient has multiple abdominal surgical incisions that are additionally tender. -Patient will be kept n.p.o. except for ice chips with meds, will keep on IV fluid, will keep on as needed fentanyl for pain control. -Triglyceride within normal limit -Does appear patient have these episodes of pancreatitis after stressful events like hospital stays and major surgery as it was provoked last year by back surgery.   Retroperitoneal fluid collection- (present on admission) Incidental identification of a 4.7 x 3.0 cm left retroperitoneal fluid collection.  No evidence of contrast in this fluid collection making this unlikely to be an active bleed.   An abscess was also considered but felt to be unlikely in the absence of fever and only a mild leukocytosis This is felt to most likely be a postoperative fluid collection  or hematoma and should be self-limiting. Obtaining  procalcitonin, CRP, blood cultures.  If patient exhibits evidence of worsening infection/does not clinically improve with conservative measures for pancreatitis as noted above we will reconsider that this could be an infectious process and discuss further with vascular surgery and/or interventional radiology for potential drainage as well as initiation of antibiotics   PVD (peripheral vascular disease) ([place])- (present on admission) Complicated history of peripheral vascular disease status post recent aortobifemoral bypass with aortic endarterectomy and bilateral common femoral endarterectomies by Dr. Unk Lightning on 1/31 We will involve vascular surgery if fluid collection in the retroperitoneal space is felt to be problematic/infectious at this time and consultation does not seem to be necessary Continue outpatient follow-up with Dr. Unk Lightning   COPD (chronic obstructive pulmonary disease) ([place])- (present on admission) No evidence of COPD exacerbation this time Continue home regimen of maintenance inhalers As needed bronchodilator therapy for episodic shortness of breath and wheezing.     Essential hypertension- (present on admission) Resume patients home regimen of oral antihypertensives Titrate antihypertensive regimen as necessary to achieve adequate BP control PRN intravenous antihypertensives for excessively elevated blood pressure       Gastro-esophageal reflux disease without esophagitis- (present on admission) Transition patient from famotidine to intravenous Protonix for now.     Elevated troponin level not due myocardial infarction- (present on admission) Slightly elevated serial troponins with flat trajectory of elevation Patient is chest pain-free Likely secondary to underlying illness, plaque rupture is unlikely Monitoring patient on telemetry     Mixed hyperlipidemia- (present on admission) Continuing home regimen of lipid lowering therapy.       DVT prophylaxis:  Lovenox Code Status: Full Family Communication: None at baseline Disposition:   Status is: Inpatient Remains inpatient appropriate because: NPO, IV fluids and pain med           Consultants:  None   Subjective:  She still complaining of significant abdominal pain, no fever, no chills.  Objective: Vitals:   12/12/21 0715 12/12/21 0725 12/12/21 0800 12/12/21 0900  BP: (!) 114/51 (!) 121/48 (!) 118/51 (!) 125/55  Pulse: 97 93 (!) 101 99  Resp: 14  14 14   Temp:      TempSrc:      SpO2: 92% 95% 98% 100%   No intake or output data in the 24 hours ending 12/12/21 0955 There were no vitals filed for this visit.  Examination:  General exam: Appears calm and comfortable  Respiratory system: Clear to auscultation. Respiratory effort normal. Cardiovascular system: S1 & S2 heard, RRR. No JVD, murmurs, rubs, gallops or clicks. No pedal edema. Gastrointestinal system: Abdomen is nondistended, she has mild diffuse tenderness, she had recent surgical scar in bilateral groin areas and mid abdomen.   Central nervous system: Alert and oriented. No focal neurological deficits. Extremities: Symmetric 5 x 5 power. Skin: No rashes, lesions or ulcers Psychiatry: Judgement and insight appear normal. Mood & affect appropriate.     Data Reviewed: I have personally reviewed following labs and imaging studies  CBC: Recent Labs  Lab 12/05/21 1655 12/06/21 0521 12/06/21 1703 12/11/21 2144 12/11/21 2200  WBC 11.1* 8.5 8.7 12.0*  --   NEUTROABS  --   --   --  9.5*  --   HGB 11.2* 9.9* 10.2* 11.6* 11.6*  HCT 31.6* 28.6* 29.2* 34.9* 34.0*  MCV 89.8 89.7 91.8 94.8  --   PLT 130* 93* 147* 300  --     Basic Metabolic Panel: Recent Labs  Lab 12/05/21 1655 12/06/21 0521 12/06/21 1703 12/08/21 0913 12/11/21 2144 12/11/21 2200  NA 140 137 135 137 134* 137  K 3.6 3.8 2.9* 3.4* 3.6 3.6  CL 104 106 107 104 101 101  CO2 28 23 22 23  21*  --   GLUCOSE 111* 87 109* 131* 142* 139*  BUN  16 14 13 12 12 11   CREATININE 0.83 0.67 0.79 0.94 0.89 0.90  CALCIUM 7.4* 7.2* 7.3* 8.3* 8.9  --     GFR: Estimated Creatinine Clearance: 35.2 mL/min (by C-G formula based on SCr of 0.9 mg/dL).  Liver Function Tests: Recent Labs  Lab 12/11/21 2144  AST 45*  ALT 32  ALKPHOS 79  BILITOT 0.8  PROT 6.0*  ALBUMIN 3.0*    CBG: No results for input(s): GLUCAP in the last 168 hours.   No results found for this or any previous visit (from the past 240 hour(s)).       Radiology Studies: CT Angio Chest/Abd/Pel for Dissection W and/or W/WO  Result Date: 12/11/2021 CLINICAL DATA:  AAA, postop EXAM: CT ANGIOGRAPHY CHEST, ABDOMEN AND PELVIS TECHNIQUE: Non-contrast CT of the chest was initially obtained. Multidetector CT imaging through the chest, abdomen and pelvis was performed using the standard protocol during bolus administration of intravenous contrast. Multiplanar reconstructed images and MIPs were obtained and reviewed to evaluate the vascular anatomy. RADIATION DOSE REDUCTION: This exam was performed according to the departmental dose-optimization program which includes automated exposure control, adjustment of the mA and/or kV according to patient size and/or use of iterative reconstruction technique. CONTRAST:  12mL OMNIPAQUE IOHEXOL 350 MG/ML SOLN COMPARISON:  09/27/2021 FINDINGS: CTA CHEST FINDINGS Cardiovascular: Diffuse aortic atherosclerosis. No aneurysm or dissection. Scattered coronary artery calcifications. Heart is normal size. Mediastinum/Nodes: No mediastinal, hilar, or axillary adenopathy. Trachea and esophagus are unremarkable. Thyroid unremarkable. Lungs/Pleura: Centrilobular emphysema. No confluent opacities or effusions. Musculoskeletal: Chest wall soft tissues are unremarkable. No acute bony abnormality. Review of the MIP images confirms the above findings. CTA ABDOMEN AND PELVIS FINDINGS VASCULAR Aorta: Aortic atherosclerosis. No aneurysm or dissection. Interval  aortobifemoral bypass. 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 Both renal arteries patent. IMA: Not visualized. Inflow: Bilateral aortobifemoral bypass grafts widely patent. Veins: No obvious venous abnormality within the limitations of this arterial phase study. Review of the MIP images confirms the above findings. NON-VASCULAR Hepatobiliary: No focal hepatic abnormality. Gallbladder unremarkable. Pancreas: Pancreatic duct mildly prominent at 4 mm. No visible pancreatic mass. Spleen: No focal abnormality.  Normal size. Adrenals/Urinary Tract: Right renal cortical thinning. Small bilateral renal cysts. No hydronephrosis. Adrenal glands and urinary bladder unremarkable. Stomach/Bowel: Postoperative changes in the sigmoid colon. Moderate stool throughout the colon. Scattered colonic diverticulosis. No active diverticulitis. Lymphatic: No adenopathy Reproductive: Uterus and adnexa unremarkable.  No mass. Other: No free fluid or free air. Within the left retroperitoneum/left anterior pararenal space, there is a predominantly low density structure measuring 4.7 x 3.0 cm, new since prior study. Some mixed density centrally. This may be a postoperative fluid collection, possibly postoperative hematoma. No active extravasation to suggest active bleed. Musculoskeletal: No acute bony abnormality. Review of the MIP images confirms the above findings. IMPRESSION: Since prior study, interval area bi femoral bypass. Bypass widely patent. No evidence of aortic aneurysm or dissection. Aortic atherosclerosis. Scattered coronary artery calcifications. No acute cardiopulmonary disease. Within the left retroperitoneum/anterior pararenal space, there is a mixed density, predominantly low-density rounded structure measuring 4.7 x 3.0 cm.  This is just superior to the proximal aortobifem anastomosis. Question postoperative fluid collection/hematoma. No contrast  extravasation to suggest active bleed. Moderate stool burden in the colon. Electronically Signed   By: Rolm Baptise M.D.   On: 12/11/2021 23:17        Scheduled Meds:  amLODipine  5 mg Oral QHS   aspirin EC  81 mg Oral QHS   cloNIDine  0.2 mg Oral QHS   enoxaparin (LOVENOX) injection  30 mg Subcutaneous Q24H   gabapentin  100 mg Oral Daily   gabapentin  200 mg Oral QHS   irbesartan  150 mg Oral Daily   metoprolol succinate  25 mg Oral QHS   pantoprazole (PROTONIX) IV  40 mg Intravenous Q24H   simvastatin  40 mg Oral QHS   umeclidinium bromide  1 puff Inhalation Daily   Continuous Infusions:  lactated ringers 100 mL/hr at 12/12/21 0327     LOS: 1 day      Phillips Climes, MD [facility]   To contact the attending provider between 7A-7P or the covering provider during after hours 7P-7A, please log into the web site [email or web address] and access using universal [place] password for that web site. If you do not have the password, please call the hospital operator.  12/12/2021, 9:55 AM

## 2021-12-12 NOTE — H&P (Signed)
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  Of note, patient was recently hospitalized at [facility] from 1/31 until 2/7 undergoing aorto bifemoral bypass with aortic endarterectomy and bilateral common femoral endarterectomies by Dr. Unk Lightning on 1/31.  Patient slowly improved postoperatively without complication and was eventually discharged on 2/7 home.  Patient now complains of new onset abdominal pain.  Patient describes this pain as severe in intensity, lasting approximately 1 day, radiating from the left sided and epigastric regions to the back.  Pain is worse with movement and improved with rest.  Pain is associated with several episodes of nonbilious nonbloody vomiting.  Patient denies fevers, sick contacts, recent travel, contact with confirmed COVID-19 infection or recent ingestion of undercooked food.  The patient's progressively worsening symptoms she eventually presented to [facility] Emergency Department for evaluation.  Upon evaluation in the emergency department due to recent vascular interventions CT angiogram of the chest, abdomen and pelvis was performed which revealed no complication of the recent procedure.  He did identify a incidental left retroperitoneal fluid collection measuring 4.7 x 3.0 cm.  Patient was also found to have a markedly elevated lipase of 7346 concerning for acute pancreatitis.  Patient was given 1 L of lactated Ringer solution as well as several doses of opiate-based  analgesics and the hospitalist group was then called to assess the patient for admission to the hospital.  Review of Systems:   Review of Systems  Gastrointestinal:  Positive for abdominal pain, nausea and vomiting.  Neurological:  Positive for weakness.  All other systems reviewed and are negative.  Past Medical History:  Diagnosis Date   Abdominal cramping    possibly irritable bowel syndrome versus adhesions secondary to multiple surgeries and abdominal inflammation and 2009 secondary to diverticulitis and diverticular phlegmon   Allergic rhinitis    Arthritis    Carotid artery disease (HCC)    19-62% RICA, < 22% LICA 97/9/89 [name] ([facility])   Cataract    Chest pain syndrome 2004   with adenosine Cardiolite that was normal   Chronic hoarseness    COPD (chronic obstructive pulmonary disease) (HCC)    Dyspnea    GERD (gastroesophageal reflux disease)    Heart murmur    Mild AS, AI. AV pk grad 18.79 mmHg, mn grad 11.20 mmHg, AVA (VTI) 1.05 cm2 10/10/20, 3 year f/u rec ([facility])   History of kidney stones    History of renal calculi    Hypercholesterolemia    Hypertension    Incontinence    Cough incontinence   Mild depression    Neuromuscular disorder (HCC)    neuropathy bilateral lower extremities   Osteopenia    Peripheral vascular disease ([place])    with femoral and carotid bruits   PONV (postoperative nausea and vomiting)    Tobacco dependence    Vitamin D deficiency     Past Surgical History:  Procedure Laterality Date   ABDOMINAL AORTOGRAM W/LOWER EXTREMITY N/A 09/18/2021   Procedure: ABDOMINAL AORTOGRAM W/LOWER EXTREMITY;  Surgeon: [name], MD;  Location: [place] [facility];  Service: Cardiovascular;  Laterality: N/A;   AORTA - BILATERAL FEMORAL ARTERY BYPASS GRAFT Bilateral 12/03/2021   Procedure: AORTOBIFEMORAL BYPASS GRAFT USING A 14 X 19mm HEMASHIELD GOLD GRAFT;  Surgeon: [name], MD;  Location: [place];  Service: Vascular;  Laterality: Bilateral;    APPENDECTOMY     BREAST BIOPSY     CATARACT EXTRACTION, BILATERAL     COLON SURGERY     2009   COSMETIC SURGERY     on neck and eyes   DILATION AND CURETTAGE OF UTERUS     EYE SURGERY     2014   FEMUR SURGERY  12/2007   left femur surgery--for femoral neck fracture status post fall    FRACTURE SURGERY     broke left leg and left arm- 2012   LIPOSUCTION     LUMBAR LAMINECTOMY/DECOMPRESSION MICRODISCECTOMY N/A 11/12/2020   Procedure: LAMINECTOMY AND FORAMINOTOMY Lumbar three four, Lumbar four five, Lumbar Five Sacral one;  Surgeon: Newman Pies, MD;  Location: [place];  Service: Neurosurgery;  Laterality: N/A;   OTHER SURGICAL HISTORY     sigmoid colectomy   SHOULDER SURGERY     arthroscopic shoulder surgery    TONSILLECTOMY     TUBAL LIGATION       reports that she has been smoking cigarettes. She has a 60.00 pack-year smoking history. She has never used smokeless tobacco. She reports current alcohol use. She reports that she does not use drugs.  Allergies  Allergen Reactions   Other Other (See Comments)    Chlorexolone - unknown reaction   Pollen Extract     Allergic to tree pollens   Tegretol [Carbamazepine] Swelling   Lisinopril Rash   Tetracyclines & Related Rash    Family History  Problem Relation Age of Onset   Heart disease Mother    Stroke Mother      Prior to Admission medications   Medication Sig Start Date End Date Taking? Authorizing Provider  amLODipine (NORVASC) 5 MG tablet Take 5 mg by mouth at bedtime. 02/14/20  Yes [provider]  aspirin EC 81 MG tablet Take 81 mg by mouth at bedtime. Swallow whole.   Yes [provider]  b complex vitamins capsule Take 1 capsule by mouth daily.   Yes [provider]  cloNIDine (CATAPRES) 0.1 MG tablet Take 0.2 mg by mouth at bedtime.   Yes [provider]  Coenzyme Q10 100 MG capsule Take 100 mg by mouth every morning.   Yes [provider]  famotidine (PEPCID) 20 MG  tablet Take 20-40 mg by mouth daily as needed for heartburn.   Yes [provider]  furosemide (LASIX) 20 MG tablet Take 20 mg by mouth daily as needed for fluid. 11/21/21  Yes [provider]  gabapentin (NEURONTIN) 100 MG capsule Take 2 capsules (200 mg total) by mouth at bedtime. PATIENT NEEDS OFFICE VISIT FOR ADDITIONAL REFILLS Patient taking differently: Take 100-200 mg by mouth See admin instructions. Take one capsule (100 mg) by mouth every morning and two capsules (200 mg) at night 07/30/15  Yes Daub, Loura Back, MD  INCRUSE ELLIPTA 62.5 MCG/INH AEPB Inhale 1 puff into the lungs daily. 11/21/20  Yes Rai, Ripudeep K, MD  irbesartan-hydrochlorothiazide (AVALIDE) 150-12.5 MG per tablet Take 1 tablet by mouth every morning.   Yes [provider]  meloxicam (MOBIC) 15 MG tablet Take 15 mg by mouth at bedtime. 04/01/21  Yes [provider]  Menthol, Topical Analgesic, (ICY HOT EX) Apply 1 application topically daily as needed (pain).   Yes [provider]  metoprolol succinate (TOPROL-XL) 25 MG 24 hr tablet Take 25 mg by mouth at bedtime.   Yes [provider]  Multiple Vitamin (MULTIVITAMIN WITH MINERALS) TABS tablet Take 1 tablet by mouth 2 (two) times daily.   Yes [provider]  simvastatin (ZOCOR) 40 MG tablet Take 40 mg by mouth at bedtime.   Yes [provider]  TURMERIC CURCUMIN PO Take 1 capsule by mouth daily. With Ginger   Yes [provider]  VITAMIN D PO Take 1 capsule by mouth daily.   Yes [provider]  VITAMIN E PO Take 1 capsule by mouth daily.   Yes [provider]  cefUROXime (CEFTIN) 500 MG tablet SMARTSIG:1 Tablet(s) By Mouth Every 12 Hours Patient not taking: Reported on 12/11/2021 11/25/21   [provider]    Physical Exam: Vitals:   12/12/21 0530 12/12/21 0545 12/12/21 0600 12/12/21 0615  BP: (!) 119/54 (!) 119/51 (!) 120/53 (!) 113/49  Pulse: 93 90 94 94  Resp: 11  10 13 14   Temp:      TempSrc:      SpO2: 94% 94% 95% 94%    Constitutional: Awake alert and oriented x3, no associated distress.   Skin: Multiple healing surgical incisions noted with appropriate tenderness.  Skin turgor.  No other rashes or lesions seen. Eyes: Pupils are equally reactive to light.  No evidence of scleral icterus or conjunctival pallor.  ENMT: Moist mucous membranes noted.  Posterior pharynx clear of any exudate or lesions.   Neck: normal, supple, no masses, no thyromegaly.  No evidence of jugular venous distension.   Respiratory: clear to auscultation bilaterally, no wheezing, no crackles. Normal respiratory effort. No accessory muscle use.  Cardiovascular: Regular rate and rhythm, no murmurs / rubs / gallops. No extremity edema. 2+ pedal pulses. No carotid bruits.  Chest:   Nontender without crepitus or deformity.   Back:   Nontender without crepitus or deformity. Abdomen: Generalized abdominal tenderness, worst in the epigastric and left upper quadrants.  Abdomen is soft.  no evidence of intra-abdominal masses.  Positive bowel sounds noted in all quadrants.   Musculoskeletal: No joint deformity upper and lower extremities. Good ROM, no contractures. Normal muscle tone.  Neurologic: CN 2-12 grossly intact. Sensation intact.  Patient moving all 4 extremities spontaneously.  Patient is following all commands.  Patient is responsive to verbal stimuli.   Psychiatric: Patient exhibits normal mood with appropriate affect.  Patient seems to possess insight as to their current situation.     Labs on Admission: I have personally reviewed following labs and imaging studies -   CBC: Recent Labs  Lab 12/05/21 0832 12/05/21 1655 12/06/21 0521 12/06/21 1703 12/11/21 2144 12/11/21 2200  WBC 12.2* 11.1* 8.5 8.7 12.0*  --   NEUTROABS  --   --   --   --  9.5*  --   HGB 11.0* 11.2* 9.9* 10.2* 11.6* 11.6*  HCT 31.2* 31.6* 28.6* 29.2* 34.9* 34.0*  MCV 90.4 89.8 89.7 91.8 94.8  --    PLT 121* 130* 93* 147* 300  --    Basic Metabolic Panel: Recent Labs  Lab 12/05/21 1655 12/06/21 0521 12/06/21 1703 12/08/21 0913 12/11/21 2144 12/11/21 2200  NA 140 137 135 137 134* 137  K 3.6 3.8 2.9* 3.4* 3.6 3.6  CL 104 106 107 104 101 101  CO2 28 23  22 23 21*  --   GLUCOSE 111* 87 109* 131* 142* 139*  BUN 16 14 13 12 12 11   CREATININE 0.83 0.67 0.79 0.94 0.89 0.90  CALCIUM 7.4* 7.2* 7.3* 8.3* 8.9  --    GFR: Estimated Creatinine Clearance: 35.2 mL/min (by C-G formula based on SCr of 0.9 mg/dL). Liver Function Tests: Recent Labs  Lab 12/05/21 0832 12/11/21 2144  AST 53* 45*  ALT 16 32  ALKPHOS 41 79  BILITOT 0.9 0.8  PROT 5.1* 6.0*  ALBUMIN 2.6* 3.0*   Recent Labs  Lab 12/11/21 2144  LIPASE 7,346*   No results for input(s): AMMONIA in the last 168 hours. Coagulation Profile: No results for input(s): INR, PROTIME in the last 168 hours. Cardiac Enzymes: No results for input(s): CKTOTAL, CKMB, CKMBINDEX, TROPONINI in the last 168 hours. BNP (last 3 results) No results for input(s): PROBNP in the last 8760 hours. HbA1C: No results for input(s): HGBA1C in the last 72 hours. CBG: No results for input(s): GLUCAP in the last 168 hours. Lipid Profile: Recent Labs    12/11/21 2310  TRIG 118   Thyroid Function Tests: No results for input(s): TSH, T4TOTAL, FREET4, T3FREE, THYROIDAB in the last 72 hours. Anemia Panel: No results for input(s): VITAMINB12, FOLATE, FERRITIN, TIBC, IRON, RETICCTPCT in the last 72 hours. Urine analysis:    Component Value Date/Time   COLORURINE AMBER (A) 11/27/2021 1030   APPEARANCEUR HAZY (A) 11/27/2021 1030   LABSPEC 1.020 11/27/2021 1030   PHURINE 5.0 11/27/2021 1030   GLUCOSEU NEGATIVE 11/27/2021 1030   HGBUR NEGATIVE 11/27/2021 1030   BILIRUBINUR NEGATIVE 11/27/2021 1030   KETONESUR 5 (A) 11/27/2021 1030   PROTEINUR NEGATIVE 11/27/2021 1030   NITRITE NEGATIVE 11/27/2021 1030   LEUKOCYTESUR TRACE (A) 11/27/2021 1030     Radiological Exams on Admission - Personally Reviewed: CT Angio Chest/Abd/Pel for Dissection W and/or W/WO  Result Date: 12/11/2021 CLINICAL DATA:  AAA, postop EXAM: CT ANGIOGRAPHY CHEST, ABDOMEN AND PELVIS TECHNIQUE: Non-contrast CT of the chest was initially obtained. Multidetector CT imaging through the chest, abdomen and pelvis was performed using the standard protocol during bolus administration of intravenous contrast. Multiplanar reconstructed images and MIPs were obtained and reviewed to evaluate the vascular anatomy. RADIATION DOSE REDUCTION: This exam was performed according to the departmental dose-optimization program which includes automated exposure control, adjustment of the mA and/or kV according to patient size and/or use of iterative reconstruction technique. CONTRAST:  1109mL OMNIPAQUE IOHEXOL 350 MG/ML SOLN COMPARISON:  09/27/2021 FINDINGS: CTA CHEST FINDINGS Cardiovascular: Diffuse aortic atherosclerosis. No aneurysm or dissection. Scattered coronary artery calcifications. Heart is normal size. Mediastinum/Nodes: No mediastinal, hilar, or axillary adenopathy. Trachea and esophagus are unremarkable. Thyroid unremarkable. Lungs/Pleura: Centrilobular emphysema. No confluent opacities or effusions. Musculoskeletal: Chest wall soft tissues are unremarkable. No acute bony abnormality. Review of the MIP images confirms the above findings. CTA ABDOMEN AND PELVIS FINDINGS VASCULAR Aorta: Aortic atherosclerosis. No aneurysm or dissection. Interval aortobifemoral bypass. 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 Both renal arteries patent. IMA: Not visualized. Inflow: Bilateral aortobifemoral bypass grafts widely patent. Veins: No obvious venous abnormality within the limitations of this arterial phase study. Review of the MIP images confirms the above findings. NON-VASCULAR Hepatobiliary: No focal hepatic abnormality. Gallbladder  unremarkable. Pancreas: Pancreatic duct mildly prominent at 4 mm. No visible pancreatic mass. Spleen: No focal abnormality.  Normal size. Adrenals/Urinary Tract: Right renal cortical thinning. Small bilateral renal cysts. No  hydronephrosis. Adrenal glands and urinary bladder unremarkable. Stomach/Bowel: Postoperative changes in the sigmoid colon. Moderate stool throughout the colon. Scattered colonic diverticulosis. No active diverticulitis. Lymphatic: No adenopathy Reproductive: Uterus and adnexa unremarkable.  No mass. Other: No free fluid or free air. Within the left retroperitoneum/left anterior pararenal space, there is a predominantly low density structure measuring 4.7 x 3.0 cm, new since prior study. Some mixed density centrally. This may be a postoperative fluid collection, possibly postoperative hematoma. No active extravasation to suggest active bleed. Musculoskeletal: No acute bony abnormality. Review of the MIP images confirms the above findings. IMPRESSION: Since prior study, interval area bi femoral bypass. Bypass widely patent. No evidence of aortic aneurysm or dissection. Aortic atherosclerosis. Scattered coronary artery calcifications. No acute cardiopulmonary disease. Within the left retroperitoneum/anterior pararenal space, there is a mixed density, predominantly low-density rounded structure measuring 4.7 x 3.0 cm. This is just superior to the proximal aortobifem anastomosis. Question postoperative fluid collection/hematoma. No contrast extravasation to suggest active bleed. Moderate stool burden in the colon. Electronically Signed   By: Rolm Baptise M.D.   On: 12/11/2021 23:17    EKG: Personally reviewed.  Rhythm is normal sinus rhythm with heart rate of 96 bpm.  No dynamic ST segment changes appreciated.  Assessment/Plan  Assessment and Plan: * Acute pancreatitis- (present on admission) Patient with known history of pancreatic divisum and episodes of acute pancreatitis secondary to  this in the past presenting with epigastric and left upper quadrant pain Markedly elevated lipase suggestive of pancreatitis Presentation complicated by recent hospitalization for vascular surgery 1/31 until 2/7 initially obscuring diagnosis Abdominal examination is certainly complicated by the fact the patient has multiple abdominal surgical incisions that are additionally tender. N.p.o. except for ice chips and meds Intravenous fluid resuscitation with isotonic fluids As needed opiate-based analgesics for associated pain ER provider obtain triglyceride level which is normal. If patient fails to clinically improve in the next several days will consider GI consultation.  Retroperitoneal fluid collection- (present on admission) Incidental identification of a 4.7 x 3.0 cm left retroperitoneal fluid collection.  No evidence of contrast in this fluid collection making this unlikely to be an active bleed.   An abscess was also considered but felt to be unlikely in the absence of fever and only a mild leukocytosis This is felt to most likely be a postoperative fluid collection or hematoma and should be self-limiting. Obtaining procalcitonin, CRP, blood cultures.  If patient exhibits evidence of worsening infection/does not clinically improve with conservative measures for pancreatitis as noted above we will reconsider that this could be an infectious process and discuss further with vascular surgery and/or interventional radiology for potential drainage as well as initiation of antibiotics  PVD (peripheral vascular disease) ([place])- (present on admission) Complicated history of peripheral vascular disease status post recent aortobifemoral bypass with aortic endarterectomy and bilateral common femoral endarterectomies by Dr. Unk Lightning on 1/31 We will involve vascular surgery if fluid collection in the retroperitoneal space is felt to be problematic/infectious at this time and consultation does not seem to be  necessary Continue outpatient follow-up with Dr. Unk Lightning  COPD (chronic obstructive pulmonary disease) ([place])- (present on admission) No evidence of COPD exacerbation this time Continue home regimen of maintenance inhalers As needed bronchodilator therapy for episodic shortness of breath and wheezing.   Essential hypertension- (present on admission) Resume patients home regimen of oral antihypertensives Titrate antihypertensive regimen as necessary to achieve adequate BP control PRN intravenous antihypertensives for excessively elevated blood pressure  Gastro-esophageal reflux disease without esophagitis- (present on admission) Transition patient from famotidine to intravenous Protonix for now.   Elevated troponin level not due myocardial infarction- (present on admission) Slightly elevated serial troponins with flat trajectory of elevation Patient is chest pain-free Likely secondary to underlying illness, plaque rupture is unlikely Monitoring patient on telemetry   Mixed hyperlipidemia- (present on admission) Continuing home regimen of lipid lowering therapy.     Code Status:  Full code  code status decision has been confirmed with: patient, Vynca Family Communication: deferred   Status is: Inpatient Remains inpatient appropriate because: Acute pancreatitis requiring high-dose intravenous opiate based analgesics, continuous intravenous volume resuscitation, close clinical monitoring with serial abdominal examinations and potential for expert consultation.            Vernelle Emerald MD [facility] Pager [phone]  If 7PM-7AM, please contact night-coverage [email or web address] Use universal [place] password for that web site. If you do not have the password, please call the hospital operator.  12/12/2021, 6:53 AM

## 2021-12-12 NOTE — Assessment & Plan Note (Signed)
   No evidence of COPD exacerbation this time  Continue home regimen of maintenance inhalers  As needed bronchodilator therapy for episodic shortness of breath and wheezing.  

## 2021-12-12 NOTE — Assessment & Plan Note (Signed)
·   Slightly elevated serial troponins with flat trajectory of elevation °· Patient is chest pain-free °· Likely secondary to underlying illness, plaque rupture is unlikely °· Monitoring patient on telemetry ° ° ° ° ° ° °

## 2021-12-12 NOTE — Assessment & Plan Note (Signed)
•   Transition patient from famotidine to intravenous Protonix for now.

## 2021-12-12 NOTE — Assessment & Plan Note (Signed)
.   Continuing home regimen of lipid lowering therapy.  

## 2021-12-12 NOTE — Assessment & Plan Note (Signed)
.   Resume patients home regimen of oral antihypertensives . Titrate antihypertensive regimen as necessary to achieve adequate BP control . PRN intravenous antihypertensives for excessively elevated blood pressure   

## 2021-12-13 ENCOUNTER — Other Ambulatory Visit (HOSPITAL_COMMUNITY): Payer: Self-pay

## 2021-12-13 LAB — CBC
HCT: 31 % — ABNORMAL LOW (ref 36.0–46.0)
Hemoglobin: 10 g/dL — ABNORMAL LOW (ref 12.0–15.0)
MCH: 31.2 pg (ref 26.0–34.0)
MCHC: 32.3 g/dL (ref 30.0–36.0)
MCV: 96.6 fL (ref 80.0–100.0)
Platelets: 260 10*3/uL (ref 150–400)
RBC: 3.21 MIL/uL — ABNORMAL LOW (ref 3.87–5.11)
RDW: 14.3 % (ref 11.5–15.5)
WBC: 11.6 10*3/uL — ABNORMAL HIGH (ref 4.0–10.5)
nRBC: 0 % (ref 0.0–0.2)

## 2021-12-13 LAB — COMPREHENSIVE METABOLIC PANEL
ALT: 15 U/L (ref 0–44)
AST: 33 U/L (ref 15–41)
Albumin: 2.1 g/dL — ABNORMAL LOW (ref 3.5–5.0)
Alkaline Phosphatase: 70 U/L (ref 38–126)
Anion gap: 11 (ref 5–15)
BUN: 13 mg/dL (ref 8–23)
CO2: 20 mmol/L — ABNORMAL LOW (ref 22–32)
Calcium: 7.8 mg/dL — ABNORMAL LOW (ref 8.9–10.3)
Chloride: 100 mmol/L (ref 98–111)
Creatinine, Ser: 0.97 mg/dL (ref 0.44–1.00)
GFR, Estimated: 59 mL/min — ABNORMAL LOW (ref >60)
Glucose, Bld: 80 mg/dL (ref 70–99)
Potassium: 4.5 mmol/L (ref 3.5–5.1)
Sodium: 131 mmol/L — ABNORMAL LOW (ref 135–145)
Total Bilirubin: 1.6 mg/dL — ABNORMAL HIGH (ref 0.3–1.2)
Total Protein: 4.8 g/dL — ABNORMAL LOW (ref 6.5–8.1)

## 2021-12-13 LAB — LIPASE, BLOOD: Lipase: 122 U/L — ABNORMAL HIGH (ref 11–51)

## 2021-12-13 MED ORDER — ROSUVASTATIN CALCIUM 5 MG PO TABS
10.0000 mg | ORAL_TABLET | Freq: Every day | ORAL | Status: DC
  Administered 2021-12-13 – 2021-12-18 (×6): 10 mg via ORAL
  Filled 2021-12-13 (×6): qty 2

## 2021-12-13 MED ORDER — MAGNESIUM SULFATE 2 GM/50ML IV SOLN
2.0000 g | Freq: Once | INTRAVENOUS | Status: AC
  Administered 2021-12-13: 2 g via INTRAVENOUS
  Filled 2021-12-13: qty 50

## 2021-12-13 NOTE — Progress Notes (Signed)
Pt with less abdominal pain today.  Incisions look excellent.  Ankles with edema, this will resolve.  Hungry.   Vascular available if questions or concerns arise.    [name]

## 2021-12-13 NOTE — Plan of Care (Signed)
  Problem: Education: Goal: Knowledge of General Education information will improve Description: Including pain rating scale, medication(s)/side effects and non-pharmacologic comfort measures Outcome: Progressing   Problem: Clinical Measurements: Goal: Ability to maintain clinical measurements within normal limits will improve Outcome: Progressing   

## 2021-12-13 NOTE — TOC Benefit Eligibility Note (Signed)
Patient Teacher, English as a foreign language completed.    The patient is currently admitted and upon discharge could be taking rosuvastatin 10 mg tablets.  The current 30 day co-pay is, $8.00.   The patient is insured through [place], [place] Patient Advocate Specialist [place] Patient Advocate Team Direct Number: [phone]  Fax: [phone]

## 2021-12-13 NOTE — Evaluation (Signed)
Occupational Therapy Evaluation/Discharge Patient Details Name: Elizabeth [name] MRN: [id number] DOB: Nov 25, 1940 Today's Date: 12/13/2021   History of Present Illness Pt is an 81 y/o female who presented with sudden onset of severe abdominal pain in the setting of acute pancreatitis. Pt with recent hospitalization 1/31-2/7 for aorto bifemoral bypass with aortic endarterectomy and bilateral common femoral endarterectomies. PMH: COPD, HTN, GERD, PVD   Clinical Impression   Pt presents with diagnoses above and reports continued Independence in ADLs, IADLs and mobility at home after recent hospitalization. Pt still works within her [employer]. Pt presents now with reported resolving pain and able to demonstrate ADLs/in room mobility without physical assist and without safety concerns. Pt denies any concerns with managing daily tasks and reports PRN family assist available at DC. Encouraged continued mobility during admission, OOB activities and pacing of activity tolerance. No further skilled OT services needed at acute level or on DC. OT to sign off.       Recommendations for follow up therapy are one component of a multi-disciplinary discharge planning process, led by the attending physician.  Recommendations may be updated based on patient status, additional functional criteria and insurance authorization.   Follow Up Recommendations  No OT follow up    Assistance Recommended at Discharge PRN  Patient can return home with the following Assistance with cooking/housework;Assist for transportation    Functional Status Assessment  Patient has not had a recent decline in their functional status  Equipment Recommendations  None recommended by OT    Recommendations for Other Services       Precautions / Restrictions Precautions Precautions: Other (comment) Precaution Comments: relatively low fall risk Restrictions Weight Bearing Restrictions: No      Mobility Bed Mobility Overal  bed mobility: Modified Independent Bed Mobility: Supine to Sit, Sit to Supine     Supine to sit: Modified independent (Device/Increase time) Sit to supine: Modified independent (Device/Increase time)        Transfers Overall transfer level: Independent Equipment used: None Transfers: Sit to/from Stand Sit to Stand: Independent           General transfer comment: no issues standign at bedside or from low toilet      Balance Overall balance assessment: Needs assistance Sitting-balance support: Feet supported, No upper extremity supported Sitting balance-Leahy Scale: Good     Standing balance support: During functional activity Standing balance-Leahy Scale: Good                             ADL either performed or assessed with clinical judgement   ADL Overall ADL's : Modified independent     Grooming: Independent;Standing;Wash/dry hands                   Toilet Transfer: Supervision/safety;Ambulation Toilet Transfer Details (indicate cue type and reason): cues for IV line safety/mgmt though no overt safety concerns, no use of AD Toileting- Clothing Manipulation and Hygiene: Independent;Sit to/from stand         General ADL Comments: Reported pain much improved, denies any deficits for ADLs/IADLs     Vision Baseline Vision/History: 1 Wears glasses Ability to See in Adequate Light: 0 Adequate Patient Visual Report: No change from baseline Vision Assessment?: No apparent visual deficits     Perception     Praxis      Pertinent Vitals/Pain Pain Assessment Pain Assessment: No/denies pain     Hand Dominance Right   Extremity/Trunk Assessment Upper  Extremity Assessment Upper Extremity Assessment: Overall WFL for tasks assessed   Lower Extremity Assessment Lower Extremity Assessment: Defer to PT evaluation   Cervical / Trunk Assessment Cervical / Trunk Assessment: Normal   Communication Communication Communication: No  difficulties   Cognition Arousal/Alertness: Awake/alert Behavior During Therapy: WFL for tasks assessed/performed Overall Cognitive Status: Within Functional Limits for tasks assessed                                       General Comments  [place] out of nose on entry, difficult to obtain reading but dynamap eventually showed 99% on RA. Pt reports similar pancreatitis episode after a previous sx    Exercises     Shoulder Instructions      Home Living Family/patient expects to be discharged to:: Private residence Living Arrangements: Alone Available Help at Discharge: Available PRN/intermittently;Family Type of Home: House Home Access: Level entry     Home Layout: One level     Bathroom Shower/Tub: Walk-in shower (walk in tub)   Bathroom Toilet: Handicapped height Bathroom Accessibility: Yes How Accessible: Accessible via wheelchair [place]: Conservation officer, nature (2 wheels);Cane - single point;Shower seat;Grab bars - toilet;Grab bars - tub/shower;Hand held shower head;Wheelchair - Water quality scientist (4 wheels)   Additional Comments: reports updating home to being handicapped accessible      Prior Functioning/Environment Prior Level of Function : Independent/Modified Independent;Working/employed;Driving             Mobility Comments: no use of AD even after previous admission/DC home ADLs Comments: Works 5 days/week (sitting at computer, 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 Independent with all ADLs/IADLs        OT Problem List:        OT Treatment/Interventions:      OT Goals(Current goals can be found in the care plan section) Acute Rehab OT Goals Patient Stated Goal: resolve issue, prevent pancreatitis from occurring again OT Goal Formulation: All assessment and education complete, DC therapy  OT Frequency:      Co-evaluation              AM-PAC OT "6 Clicks" Daily Activity     Outcome Measure Help from another  person eating meals?: None Help from another person taking care of personal grooming?: None Help from another person toileting, which includes using toliet, bedpan, or urinal?: None Help from another person bathing (including washing, rinsing, drying)?: None Help from another person to put on and taking off regular upper body clothing?: None Help from another person to put on and taking off regular lower body clothing?: None 6 Click Score: 24   End of Session Nurse Communication: Mobility status  Activity Tolerance: Patient tolerated treatment well Patient left: in bed;with call bell/phone within reach  OT Visit Diagnosis: Pain Pain - part of body:  (abdomen)                Time: 7654-6503 OT Time Calculation (min): 21 min Charges:  OT General Charges $OT Visit: 1 Visit OT Evaluation $OT Eval Low Complexity: 1 Low  Malachy Chamber, OTR/L Acute Rehab Services Office: [phone]   Layla Maw 12/13/2021, 10:21 AM

## 2021-12-13 NOTE — Evaluation (Addendum)
Physical Therapy Evaluation & Discharge Patient Details Name: Elizabeth [name] MRN: [id number] DOB: 02/01/1941 Today's Date: 12/13/2021  History of Present Illness  81 y/o female who presented to ED on 12/11/21 with sudden onset of severe abdominal pain in the setting of acute pancreatitis. Pt with recent hospitalization 1/31-2/7 for aorto bifemoral bypass with aortic endarterectomy and bilateral common femoral endarterectomies. PMH: COPD, HTN, GERD, PVD  Clinical Impression  Patient admitted with above findings following recent admission. Patient functioning at modI level for mobility with no AD. Patient feels generally weak but functioning well. Patient does demonstrate decreased activity tolerance with 2/4 DOE following ambulation. Encouraged further mobility with nursing staff to improve activity tolerance. No further skilled PT needs identified acutely. No PT follow up recommended at this time. PT will sign off.        Recommendations for follow up therapy are one component of a multi-disciplinary discharge planning process, led by the attending physician.  Recommendations may be updated based on patient status, additional functional criteria and insurance authorization.  Follow Up Recommendations No PT follow up    Assistance Recommended at Discharge PRN  Patient can return home with the following  Assist for transportation    Equipment Recommendations None recommended by PT  Recommendations for Other Services       Functional Status Assessment Patient has had a recent decline in their functional status and demonstrates the ability to make significant improvements in function in a reasonable and predictable amount of time.     Precautions / Restrictions Precautions Precautions: None Precaution Comments: relatively low fall risk Restrictions Weight Bearing Restrictions: No      Mobility  Bed Mobility Overal bed mobility: Modified Independent                   Transfers Overall transfer level: Modified independent Equipment used: None                    Ambulation/Gait Ambulation/Gait assistance: Modified independent (Device/Increase time), Supervision Gait Distance (Feet): 250 Feet Assistive device: None Gait Pattern/deviations: WFL(Within Functional Limits) Gait velocity: decreased     General Gait Details: slow but steady gait. 2/4 DOE. No LOB noted throughout  Stairs            Wheelchair Mobility    Modified Rankin (Stroke Patients Only)       Balance Overall balance assessment: Mild deficits observed, not formally tested                                           Pertinent Vitals/Pain Pain Assessment Pain Assessment: No/denies pain    Home Living Family/patient expects to be discharged to:: Private residence Living Arrangements: Alone Available Help at Discharge: Available PRN/intermittently;Family Type of Home: House Home Access: Level entry       Home Layout: One level Home Equipment: Conservation officer, nature (2 wheels);Cane - single point;Shower seat;Grab bars - toilet;Grab bars - tub/shower;Hand held shower head;Wheelchair - Water quality scientist (4 wheels) Additional Comments: reports updating home to being handicapped accessible    Prior Function Prior Level of Function : Independent/Modified Independent;Working/employed;Driving             Mobility Comments: no use of AD even after previous admission/DC home ADLs Comments: Works 5 days/week (sitting at computer, 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 Independent with all ADLs/IADLs     Hand Dominance  Dominant Hand: Right    Extremity/Trunk Assessment   Upper Extremity Assessment Upper Extremity Assessment: Defer to OT evaluation    Lower Extremity Assessment Lower Extremity Assessment: Generalized weakness    Cervical / Trunk Assessment Cervical / Trunk Assessment: Normal  Communication    Communication: No difficulties  Cognition Arousal/Alertness: Awake/alert Behavior During Therapy: WFL for tasks assessed/performed Overall Cognitive Status: Within Functional Limits for tasks assessed                                          General Comments General comments (skin integrity, edema, etc.): [place] out of nose on entry, difficult to obtain reading but dynamap eventually showed 99% on RA. Pt reports similar pancreatitis episode after a previous sx    Exercises     Assessment/Plan    PT Assessment Patient does not need any further PT services  PT Problem List         PT Treatment Interventions      PT Goals (Current goals can be found in the Care Plan section)  Acute Rehab PT Goals Patient Stated Goal: to get better PT Goal Formulation: All assessment and education complete, DC therapy    Frequency       Co-evaluation               AM-PAC PT "6 Clicks" Mobility  Outcome Measure Help needed turning from your back to your side while in a flat bed without using bedrails?: None Help needed moving from lying on your back to sitting on the side of a flat bed without using bedrails?: None Help needed moving to and from a bed to a chair (including a wheelchair)?: None Help needed standing up from a chair using your arms (e.g., wheelchair or bedside chair)?: None Help needed to walk in hospital room?: None Help needed climbing 3-5 steps with a railing? : None 6 Click Score: 24    End of Session   Activity Tolerance: Patient tolerated treatment well Patient left: in bed;with call bell/phone within reach Nurse Communication: Mobility status PT Visit Diagnosis: Muscle weakness (generalized) (M62.81)    Time: 0626-9485 PT Time Calculation (min) (ACUTE ONLY): 14 min   Charges:   PT Evaluation $PT Eval Low Complexity: 1 Low          Tycen Dockter A. Gilford Rile PT, DPT Acute Rehabilitation Services Pager [phone] Office  [phone]   Linna Hoff 12/13/2021, 11:04 AM

## 2021-12-13 NOTE — TOC Progression Note (Signed)
Transition of Care [facility]) - Initial/Assessment Note    Patient Details  Name: Elizabeth [name] MRN: [id number] Date of Birth: [birth year]  Transition of Care [facility]) CM/SW Contact:    Milinda Antis, [place] Phone Number: 12/13/2021, 2:55 PM  Clinical Narrative:                   Patient presented with complaints of left-sided abdominal pain.  Patient was recently hospitalized at [facility] from 1/31 until 2/7 undergoing aorto bifemoral bypass with aortic endarterectomy and bilateral common femoral endarterectomies.  The patient is from home alone, but has a son who lives across the driveway and a grand daughter who lives behind her. The patient does not report any concerns with affording medication or transportation to appointments.    Transition of Care Department [facility]) has reviewed patient and no TOC needs have been identified at this time. We will continue to monitor patient advancement through interdisciplinary progression rounds. If new patient transition needs arise, please place a TOC consult.         Patient Goals and CMS Choice        Expected Discharge Plan and Services                                                Prior Living Arrangements/Services                       Activities of Daily Living      Permission Sought/Granted                  Emotional Assessment              Admission diagnosis:  Acute pancreatitis [K85.90] Acute pancreatitis, unspecified complication status, unspecified pancreatitis type [K85.90] Patient Active Problem List   Diagnosis Date Noted   Retroperitoneal fluid collection 12/12/2021   Elevated troponin level not due myocardial infarction 12/12/2021   PVD (peripheral vascular disease) ([place]) 12/03/2021   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 [place] [address] Phone: [phone] Fax: [phone]     Social Determinants of Health (SDOH) Interventions    Readmission Risk Interventions Readmission Risk Prevention Plan 12/10/2021  Post Dischage Appt Complete  Medication Screening Complete  Transportation Screening Complete  Some recent data might be hidden

## 2021-12-13 NOTE — Progress Notes (Addendum)
PROGRESS NOTE    Elizabeth [name]  ZYY:[id number] DOB: 11/13/1940 DOA: 12/11/2021 PCP: [name], MD    Chief Complaint  Patient presents with   Abdominal Pain    Brief Narrative:     81 year old female with past medical history of COPD, hypertension, gastroesophageal reflux disease, irritable bowel syndrome, pancreatic divisium with previous episodes of acute pancreatitis presented to [place] emergency department with complaints of left-sided abdominal pain.   Of note, patient was recently hospitalized at [facility] from 1/31 until 2/7 undergoing aorto bifemoral bypass with aortic endarterectomy and bilateral common femoral endarterectomies by Dr. Unk Lightning on 1/31.  Patient slowly improved postoperatively without complication and was eventually discharged on 2/7 home.   Patient now complains of new onset abdominal pain.  Patient describes this pain as severe in intensity, lasting approximately 1 day, radiating from the left sided and epigastric regions to the back.  Pain is worse with movement and improved with rest.  Pain is associated with several episodes of nonbilious nonbloody vomiting.  Patient denies fevers, sick contacts, recent travel, contact with confirmed COVID-19 infection or recent ingestion of undercooked food.  The patient's progressively worsening symptoms she eventually presented to [facility] Emergency Department for evaluation.   Upon evaluation in the emergency department due to recent vascular interventions CT angiogram of the chest, abdomen and pelvis was performed which revealed no complication of the recent procedure.  He did identify a incidental left retroperitoneal fluid collection measuring 4.7 x 3.0 cm.  Patient was also found to have a markedly elevated lipase of 7346 concerning for acute pancreatitis.  Patient was given 1 L of lactated Ringer solution as well as several doses of opiate-based analgesics and the hospitalist group was  then called to assess the patient for admission to the hospital.     Assessment & Plan:   Principal Problem:   Acute pancreatitis Active Problems:   COPD (chronic obstructive pulmonary disease) ([place])   Essential hypertension   Gastro-esophageal reflux disease without esophagitis   Mixed hyperlipidemia   PVD (peripheral vascular disease) ([facility])   Retroperitoneal fluid collection   Elevated troponin level not due myocardial infarction   Acute pancreatitis- (present on admission) - Patient with known history of pancreatic divisum and episodes of acute pancreatitis secondary to this in the past presenting with epigastric and left upper quadrant pain - Markedly elevated lipase suggestive of pancreatitis - Presentation complicated by recent hospitalization for vascular surgery 1/31 until 2/7 initially obscuring diagnosis - Abdominal examination is certainly complicated by the fact the patient has multiple abdominal surgical incisions that are additionally tender. -Appears to be improving, abdomen and less painful today, she reports she is hungry, I will start her on clear liquid diet, I have encouraged her to hold on drinking if she had recurrent abdominal pain or nausea, continue with as needed pain regimen, continue with IV fluids. -Triglyceride within normal limit -Does appear patient have these episodes of pancreatitis after stressful events like hospital stays and major surgery as it was provoked last year by back surgery.   Retroperitoneal fluid collection- (present on admission) - Incidental identification of a 4.7 x 3.0 cm left retroperitoneal fluid collection.  No evidence of contrast in this fluid collection making this unlikely to be an active bleed.   -Vascular surgery input appreciated, most likely due to small area of hematoma which is not surprising.  Aortobifemoral bypass.    PVD (peripheral vascular disease) ([place])- (present on admission) -Complicated history of  peripheral  vascular disease status post recent aortobifemoral bypass with aortic endarterectomy and bilateral common femoral endarterectomies by Dr. Unk Lightning on 1/31   COPD (chronic obstructive pulmonary disease) ([place])- (present on admission) - No evidence of COPD exacerbation this time - Continue home regimen of maintenance inhalers - As needed bronchodilator therapy for episodic shortness of breath and wheezing.     Essential hypertension- (present on admission) -She was resumed on her home medication, blood pressure on the lower side, so I have discontinued irbesartan and Norvasc, meanwhile continue with clonidine and metoprolol - PRN intravenous antihypertensives for excessively elevated blood pressure   Gastro-esophageal reflux disease without esophagitis- (present on admission) - Transition patient from famotidine to intravenous Protonix for now.     Elevated troponin level not due myocardial infarction- (present on admission) -To demand ischemia     Mixed hyperlipidemia- (present on admission) -Continuing home regimen of lipid lowering therapy.   Hypomagnesemia -Level was low at 1.4 yesterday, repleted.  DVT prophylaxis: Lovenox Code Status: Full Family Communication: None at baseline Disposition:   Status is: Inpatient Remains inpatient appropriate because: NPO, IV fluids and pain med           Consultants:  None   Subjective:  Patient reports abdominal pain is improving, reports she is hungry today.  Objective: Vitals:   12/12/21 1956 12/12/21 2307 12/13/21 0414 12/13/21 0937  BP: (!) 114/43 128/66 (!) 104/48 (!) 110/44  Pulse: (!) 104 99 (!) 102 100  Resp: 18 17 18 17   Temp: 99.4 F (37.4 C) 98.8 F (37.1 C) 98.9 F (37.2 C) 98.6 F (37 C)  TempSrc: Oral  Oral Oral  SpO2: 100% 97% 95% 92%  Weight:   53.7 kg     Intake/Output Summary (Last 24 hours) at 12/13/2021 1003 Last data filed at 12/13/2021 0800 Gross per 24 hour  Intake 0 ml  Output 400 ml   Net -400 ml   Filed Weights   12/13/21 0414  Weight: 53.7 kg    Examination:  Awake Alert, Oriented X 3, No new F.N deficits, Normal affect Symmetrical Chest wall movement, Good air movement bilaterally, CTAB RRR,No Gallops,Rubs or new Murmurs, No Parasternal Heave +ve B.Sounds, Abd Soft, she remains with diffuse tenderness, but it did much improved, surgical incision in mid abdomen and bilateral groin area appears to be healing nicely. No Cyanosis, Clubbing or edema, No new Rash or bruise      Data Reviewed: I have personally reviewed following labs and imaging studies  CBC: Recent Labs  Lab 12/06/21 1703 12/11/21 2144 12/11/21 2200 12/12/21 1649 12/13/21 0314  WBC 8.7 12.0*  --  12.6* 11.6*  NEUTROABS  --  9.5*  --  9.8*  --   HGB 10.2* 11.6* 11.6* 10.8* 10.0*  HCT 29.2* 34.9* 34.0* 32.7* 31.0*  MCV 91.8 94.8  --  96.5 96.6  PLT 147* 300  --  266 762    Basic Metabolic Panel: Recent Labs  Lab 12/06/21 1703 12/08/21 0913 12/11/21 2144 12/11/21 2200 12/12/21 1649 12/13/21 0314  NA 135 137 134* 137 135 131*  K 2.9* 3.4* 3.6 3.6 4.2 4.5  CL 107 104 101 101 100 100  CO2 22 23 21*  --  27 20*  GLUCOSE 109* 131* 142* 139* 115* 80  BUN 13 12 12 11 9 13   CREATININE 0.79 0.94 0.89 0.90 0.89 0.97  CALCIUM 7.3* 8.3* 8.9  --  8.4* 7.8*  MG  --   --   --   --  1.4*  --     GFR: Estimated Creatinine Clearance: 32.7 mL/min (by C-G formula based on SCr of 0.97 mg/dL).  Liver Function Tests: Recent Labs  Lab 12/11/21 2144 12/12/21 1649 12/13/21 0314  AST 45* 28 33  ALT 32 24 15  ALKPHOS 79 69 70  BILITOT 0.8 0.8 1.6*  PROT 6.0* 5.3* 4.8*  ALBUMIN 3.0* 2.6* 2.1*    CBG: No results for input(s): GLUCAP in the last 168 hours.   Recent Results (from the past 240 hour(s))  Culture, blood (routine x 2)     Status: None (Preliminary result)   Collection Time: 12/12/21 12:45 AM   Specimen: BLOOD  Result Value Ref Range Status   Specimen Description BLOOD  RIGHT ARM  Final   Special Requests   Final    BOTTLES DRAWN AEROBIC AND ANAEROBIC Blood Culture results may not be optimal due to an inadequate volume of blood received in culture bottles   Culture   Final    NO GROWTH < 12 HOURS Performed at [facility] [address], [place], [place] [address]    Report Status PENDING  Incomplete  Culture, blood (routine x 2)     Status: None (Preliminary result)   Collection Time: 12/12/21 12:50 AM   Specimen: BLOOD  Result Value Ref Range Status   Specimen Description BLOOD LEFT ARM  Final   Special Requests   Final    BOTTLES DRAWN AEROBIC AND ANAEROBIC Blood Culture results may not be optimal due to an inadequate volume of blood received in culture bottles   Culture   Final    NO GROWTH < 12 HOURS Performed at [facility], [place] [address] [place], [place] [address]    Report Status PENDING  Incomplete  Resp Panel by RT-PCR (Flu A&B, Covid) Nasopharyngeal Swab     Status: None   Collection Time: 12/12/21 12:53 PM   Specimen: Nasopharyngeal Swab; Nasopharyngeal(NP) swabs in vial transport medium  Result Value Ref Range Status   SARS Coronavirus 2 by RT PCR NEGATIVE NEGATIVE Final    Comment: (NOTE) SARS-CoV-2 target nucleic acids are NOT DETECTED.  The SARS-CoV-2 RNA is generally detectable in upper respiratory specimens during the acute phase of infection. The lowest concentration of SARS-CoV-2 viral copies this assay can detect is 138 copies/mL. A negative result does not preclude SARS-Cov-2 infection and should not be used as the sole basis for treatment or other patient management decisions. A negative result may occur with  improper specimen collection/handling, submission of specimen other than nasopharyngeal swab, presence of viral mutation(s) within the areas targeted by this assay, and inadequate number of viral copies(<138 copies/mL). A negative result must be combined with clinical observations, patient history,  and epidemiological information. The expected result is Negative.  Fact Sheet for Patients:  EntrepreneurPulse.com.au  Fact Sheet for Healthcare Providers:  IncredibleEmployment.be  This test is no t yet approved or cleared by the [place] FDA and  has been authorized for detection and/or diagnosis of SARS-CoV-2 by FDA under an Emergency Use Authorization (EUA). This EUA will remain  in effect (meaning this test can be used) for the duration of the COVID-19 declaration under Section 564(b)(1) of the Act, 21 U.S.C.section 360bbb-3(b)(1), unless the authorization is terminated  or revoked sooner.       Influenza A by PCR NEGATIVE NEGATIVE Final   Influenza B by PCR NEGATIVE NEGATIVE Final    Comment: (NOTE) The Xpert Xpress SARS-CoV-2/FLU/RSV plus assay is  intended as an aid in the diagnosis of influenza from Nasopharyngeal swab specimens and should not be used as a sole basis for treatment. Nasal washings and aspirates are unacceptable for Xpert Xpress SARS-CoV-2/FLU/RSV testing.  Fact Sheet for Patients: EntrepreneurPulse.com.au  Fact Sheet for Healthcare Providers: IncredibleEmployment.be  This test is not yet approved or cleared by the [place] FDA and has been authorized for detection and/or diagnosis of SARS-CoV-2 by FDA under an Emergency Use Authorization (EUA). This EUA will remain in effect (meaning this test can be used) for the duration of the COVID-19 declaration under Section 564(b)(1) of the Act, 21 U.S.C. section 360bbb-3(b)(1), unless the authorization is terminated or revoked.  Performed at [facility], [place] [address] [place], [place] [address]          Radiology Studies: CT Angio Chest/Abd/Pel for Dissection W and/or W/WO  Result Date: 12/11/2021 CLINICAL DATA:  AAA, postop EXAM: CT ANGIOGRAPHY CHEST, ABDOMEN AND PELVIS TECHNIQUE: Non-contrast CT of the chest  was initially obtained. Multidetector CT imaging through the chest, abdomen and pelvis was performed using the standard protocol during bolus administration of intravenous contrast. Multiplanar reconstructed images and MIPs were obtained and reviewed to evaluate the vascular anatomy. RADIATION DOSE REDUCTION: This exam was performed according to the departmental dose-optimization program which includes automated exposure control, adjustment of the mA and/or kV according to patient size and/or use of iterative reconstruction technique. CONTRAST:  154mL OMNIPAQUE IOHEXOL 350 MG/ML SOLN COMPARISON:  09/27/2021 FINDINGS: CTA CHEST FINDINGS Cardiovascular: Diffuse aortic atherosclerosis. No aneurysm or dissection. Scattered coronary artery calcifications. Heart is normal size. Mediastinum/Nodes: No mediastinal, hilar, or axillary adenopathy. Trachea and esophagus are unremarkable. Thyroid unremarkable. Lungs/Pleura: Centrilobular emphysema. No confluent opacities or effusions. Musculoskeletal: Chest wall soft tissues are unremarkable. No acute bony abnormality. Review of the MIP images confirms the above findings. CTA ABDOMEN AND PELVIS FINDINGS VASCULAR Aorta: Aortic atherosclerosis. No aneurysm or dissection. Interval aortobifemoral bypass. 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 Both renal arteries patent. IMA: Not visualized. Inflow: Bilateral aortobifemoral bypass grafts widely patent. Veins: No obvious venous abnormality within the limitations of this arterial phase study. Review of the MIP images confirms the above findings. NON-VASCULAR Hepatobiliary: No focal hepatic abnormality. Gallbladder unremarkable. Pancreas: Pancreatic duct mildly prominent at 4 mm. No visible pancreatic mass. Spleen: No focal abnormality.  Normal size. Adrenals/Urinary Tract: Right renal cortical thinning. Small bilateral renal cysts. No hydronephrosis. Adrenal glands  and urinary bladder unremarkable. Stomach/Bowel: Postoperative changes in the sigmoid colon. Moderate stool throughout the colon. Scattered colonic diverticulosis. No active diverticulitis. Lymphatic: No adenopathy Reproductive: Uterus and adnexa unremarkable.  No mass. Other: No free fluid or free air. Within the left retroperitoneum/left anterior pararenal space, there is a predominantly low density structure measuring 4.7 x 3.0 cm, new since prior study. Some mixed density centrally. This may be a postoperative fluid collection, possibly postoperative hematoma. No active extravasation to suggest active bleed. Musculoskeletal: No acute bony abnormality. Review of the MIP images confirms the above findings. IMPRESSION: Since prior study, interval area bi femoral bypass. Bypass widely patent. No evidence of aortic aneurysm or dissection. Aortic atherosclerosis. Scattered coronary artery calcifications. No acute cardiopulmonary disease. Within the left retroperitoneum/anterior pararenal space, there is a mixed density, predominantly low-density rounded structure measuring 4.7 x 3.0 cm. This is just superior to the proximal aortobifem anastomosis. Question postoperative fluid collection/hematoma. No contrast extravasation to suggest active bleed. Moderate stool burden in  the colon. Electronically Signed   By: Rolm Baptise M.D.   On: 12/11/2021 23:17        Scheduled Meds:  amLODipine  5 mg Oral QHS   aspirin EC  81 mg Oral QHS   cloNIDine  0.2 mg Oral QHS   enoxaparin (LOVENOX) injection  30 mg Subcutaneous Q24H   gabapentin  100 mg Oral Daily   gabapentin  200 mg Oral QHS   irbesartan  150 mg Oral Daily   metoprolol succinate  25 mg Oral QHS   pantoprazole (PROTONIX) IV  40 mg Intravenous Q24H   simvastatin  40 mg Oral QHS   umeclidinium bromide  1 puff Inhalation Daily   Continuous Infusions:  lactated ringers 100 mL/hr at 12/13/21 0103   magnesium sulfate bolus IVPB 2 g (12/13/21 1003)      LOS: 2 days      Phillips Climes, MD [facility]   To contact the attending provider between 7A-7P or the covering provider during after hours 7P-7A, please log into the web site [email or web address] and access using universal [place] password for that web site. If you do not have the password, please call the hospital operator.  12/13/2021, 10:03 AM

## 2021-12-14 LAB — COMPREHENSIVE METABOLIC PANEL
ALT: 12 U/L (ref 0–44)
AST: 18 U/L (ref 15–41)
Albumin: 1.9 g/dL — ABNORMAL LOW (ref 3.5–5.0)
Alkaline Phosphatase: 59 U/L (ref 38–126)
Anion gap: 10 (ref 5–15)
BUN: 11 mg/dL (ref 8–23)
CO2: 23 mmol/L (ref 22–32)
Calcium: 7.8 mg/dL — ABNORMAL LOW (ref 8.9–10.3)
Chloride: 101 mmol/L (ref 98–111)
Creatinine, Ser: 0.87 mg/dL (ref 0.44–1.00)
GFR, Estimated: >60 mL/min (ref >60)
Glucose, Bld: 96 mg/dL (ref 70–99)
Potassium: 3.9 mmol/L (ref 3.5–5.1)
Sodium: 134 mmol/L — ABNORMAL LOW (ref 135–145)
Total Bilirubin: 0.8 mg/dL (ref 0.3–1.2)
Total Protein: 4.5 g/dL — ABNORMAL LOW (ref 6.5–8.1)

## 2021-12-14 LAB — CBC
HCT: 27.2 % — ABNORMAL LOW (ref 36.0–46.0)
Hemoglobin: 8.7 g/dL — ABNORMAL LOW (ref 12.0–15.0)
MCH: 31.2 pg (ref 26.0–34.0)
MCHC: 32 g/dL (ref 30.0–36.0)
MCV: 97.5 fL (ref 80.0–100.0)
Platelets: 253 10*3/uL (ref 150–400)
RBC: 2.79 MIL/uL — ABNORMAL LOW (ref 3.87–5.11)
RDW: 14 % (ref 11.5–15.5)
WBC: 8.1 10*3/uL (ref 4.0–10.5)
nRBC: 0 % (ref 0.0–0.2)

## 2021-12-14 LAB — MAGNESIUM: Magnesium: 1.9 mg/dL (ref 1.7–2.4)

## 2021-12-14 LAB — PHOSPHORUS: Phosphorus: 2.8 mg/dL (ref 2.5–4.6)

## 2021-12-14 MED ORDER — SENNOSIDES-DOCUSATE SODIUM 8.6-50 MG PO TABS
2.0000 | ORAL_TABLET | Freq: Two times a day (BID) | ORAL | Status: DC
  Administered 2021-12-14 – 2021-12-18 (×5): 2 via ORAL
  Filled 2021-12-14 (×8): qty 2

## 2021-12-14 MED ORDER — ENSURE ENLIVE PO LIQD
237.0000 mL | Freq: Two times a day (BID) | ORAL | Status: DC
  Administered 2021-12-14: 237 mL via ORAL

## 2021-12-14 NOTE — Progress Notes (Signed)
PROGRESS NOTE    Elizabeth [name]  LYY:[id number] DOB: February 24, 1941 DOA: 12/11/2021 PCP: [name], MD    Chief Complaint  Patient presents with   Abdominal Pain    Brief Narrative:    81 year old female with past medical history of COPD, hypertension, gastroesophageal reflux disease, irritable bowel syndrome, pancreatic divisium with previous episodes of acute pancreatitis presented to [facility] emergency department with complaints of left-sided abdominal pain.   Of note, patient was recently hospitalized at [facility] from 1/31 until 2/7 undergoing aorto bifemoral bypass with aortic endarterectomy and bilateral common femoral endarterectomies by Dr. Unk Lightning on 1/31.  Patient slowly improved postoperatively without complication and was eventually discharged on 2/7 home. -Patient work-up was significant for acute pancreatitis, she was admitted for further management.   Assessment & Plan:   Principal Problem:   Acute pancreatitis Active Problems:   COPD (chronic obstructive pulmonary disease) (HCC)   Essential hypertension   Gastro-esophageal reflux disease without esophagitis   Mixed hyperlipidemia   PVD (peripheral vascular disease) (HCC)   Retroperitoneal fluid collection   Elevated troponin level not due myocardial infarction   Acute pancreatitis- (present on admission) - Patient with known history of pancreatic divisum and episodes of acute pancreatitis secondary to this in the past presenting with epigastric and left upper quadrant pain - Markedly elevated lipase suggestive of pancreatitis - Presentation complicated by recent hospitalization for vascular surgery 1/31 until 2/7 initially obscuring diagnosis - Abdominal examination is certainly complicated by the fact the patient has multiple abdominal surgical incisions that are additionally tender. -Triglyceride within normal limit -Does appear patient have these episodes of pancreatitis after stressful  events like hospital stays and major surgery as it was provoked last year by back surgery. -Patient started on clear liquid diet yesterday, she remains with some nausea, poor appetite, though I will hold on advancing her diet, I will just add Ensure, continue with IV fluid, as needed regimen, no bowel movement yet so we will start on stool softener.   Retroperitoneal fluid collection- (present on admission) - Incidental identification of a 4.7 x 3.0 cm left retroperitoneal fluid collection.  No evidence of contrast in this fluid collection making this unlikely to be an active bleed.   -Vascular surgery input appreciated, most likely due to small area of hematoma which is not surprising.  Aortobifemoral bypass.    PVD (peripheral vascular disease) ([place])- (present on admission) -Complicated history of peripheral vascular disease status post recent aortobifemoral bypass with aortic endarterectomy and bilateral common femoral endarterectomies by Dr. Unk Lightning on 1/31   COPD (chronic obstructive pulmonary disease) ([place])- (present on admission) - No evidence of COPD exacerbation this time - Continue home regimen of maintenance inhalers - As needed bronchodilator therapy for episodic shortness of breath and wheezing.     Essential hypertension- (present on admission) -She was resumed on her home medication, blood pressure on the lower side, so I have discontinued irbesartan and Norvasc, meanwhile continue with clonidine and metoprolol - PRN intravenous antihypertensives for excessively elevated blood pressure   Gastro-esophageal reflux disease without esophagitis- (present on admission) - Transition patient from famotidine to intravenous Protonix for now.     Elevated troponin level not due myocardial infarction- (present on admission) -To demand ischemia     Mixed hyperlipidemia- (present on admission) -Continuing home regimen of lipid lowering therapy.   Hypomagnesemia -Level was low at  1.4, was repleted, continue to monitor  Hyponatremia - continue with IV fluids  DVT prophylaxis: Lovenox  Code Status: Full Family Communication: None at baseline Disposition:   Status is: Inpatient Remains inpatient appropriate because:  IV fluids and pain med           Consultants:  None   Subjective:  Patient still reports poor appetite, some nausea, but no vomiting, reports abdominal pain at baseline and controlled on current regimen, has not been worsening with clear liquid diet.  .  Patient was encouraged to use incentive spirometer, out of bed to chair, she was started on stool softener, will check bladder scan.  Objective: Vitals:   12/13/21 2316 12/14/21 0513 12/14/21 0752 12/14/21 0851  BP:  95/61  (!) 117/44  Pulse:  75  78  Resp:  18  16  Temp:  97.7 F (36.5 C)  98.2 F (36.8 C)  TempSrc:  Oral  Oral  SpO2:  91% 91% 96%  Weight: 53.7 kg     Height: 5' (1.524 m)       Intake/Output Summary (Last 24 hours) at 12/14/2021 1045 Last data filed at 12/14/2021 0500 Gross per 24 hour  Intake 3930 ml  Output 3 ml  Net 3927 ml   Filed Weights   12/13/21 0414 12/13/21 2316  Weight: 53.7 kg 53.7 kg    Examination:  Awake Alert, Oriented X 3, No new F.N deficits, Normal affect Symmetrical Chest wall movement, Good air movement bilaterally, CTAB RRR,No Gallops,Rubs or new Murmurs, No Parasternal Heave +ve B.Sounds, she has diffuse tenderness , which is mild, better than yesterday, surgical wound in mid abdomen and bilateral groin area healing nicely with no oozing or discharge.   No Cyanosis, Clubbing or edema, No new Rash or bruise       Data Reviewed: I have personally reviewed following labs and imaging studies  CBC: Recent Labs  Lab 12/11/21 2144 12/11/21 2200 12/12/21 1649 12/13/21 0314 12/14/21 0120  WBC 12.0*  --  12.6* 11.6* 8.1  NEUTROABS 9.5*  --  9.8*  --   --   HGB 11.6* 11.6* 10.8* 10.0* 8.7*  HCT 34.9* 34.0* 32.7* 31.0* 27.2*   MCV 94.8  --  96.5 96.6 97.5  PLT 300  --  266 260 440    Basic Metabolic Panel: Recent Labs  Lab 12/08/21 0913 12/11/21 2144 12/11/21 2200 12/12/21 1649 12/13/21 0314 12/14/21 0120  NA 137 134* 137 135 131* 134*  K 3.4* 3.6 3.6 4.2 4.5 3.9  CL 104 101 101 100 100 101  CO2 23 21*  --  27 20* 23  GLUCOSE 131* 142* 139* 115* 80 96  BUN 12 12 11 9 13 11   CREATININE 0.94 0.89 0.90 0.89 0.97 0.87  CALCIUM 8.3* 8.9  --  8.4* 7.8* 7.8*  MG  --   --   --  1.4*  --  1.9  PHOS  --   --   --   --   --  2.8    GFR: Estimated Creatinine Clearance: 36.4 mL/min (by C-G formula based on SCr of 0.87 mg/dL).  Liver Function Tests: Recent Labs  Lab 12/11/21 2144 12/12/21 1649 12/13/21 0314 12/14/21 0120  AST 45* 28 33 18  ALT 32 24 15 12   ALKPHOS 79 69 70 59  BILITOT 0.8 0.8 1.6* 0.8  PROT 6.0* 5.3* 4.8* 4.5*  ALBUMIN 3.0* 2.6* 2.1* 1.9*    CBG: No results for input(s): GLUCAP in the last 168 hours.   Recent Results (from the past 240 hour(s))  Culture, blood (routine x 2)  Status: None (Preliminary result)   Collection Time: 12/12/21 12:45 AM   Specimen: BLOOD  Result Value Ref Range Status   Specimen Description BLOOD RIGHT ARM  Final   Special Requests   Final    BOTTLES DRAWN AEROBIC AND ANAEROBIC Blood Culture results may not be optimal due to an inadequate volume of blood received in culture bottles   Culture   Final    NO GROWTH 2 DAYS Performed at [facility], [place] [address], [place], [place] [address]    Report Status PENDING  Incomplete  Culture, blood (routine x 2)     Status: None (Preliminary result)   Collection Time: 12/12/21 12:50 AM   Specimen: BLOOD  Result Value Ref Range Status   Specimen Description BLOOD LEFT ARM  Final   Special Requests   Final    BOTTLES DRAWN AEROBIC AND ANAEROBIC Blood Culture results may not be optimal due to an inadequate volume of blood received in culture bottles   Culture   Final    NO GROWTH 2  DAYS Performed at [facility], [place] [address] [place], [place] [address]    Report Status PENDING  Incomplete  Resp Panel by RT-PCR (Flu A&B, Covid) Nasopharyngeal Swab     Status: None   Collection Time: 12/12/21 12:53 PM   Specimen: Nasopharyngeal Swab; Nasopharyngeal(NP) swabs in vial transport medium  Result Value Ref Range Status   SARS Coronavirus 2 by RT PCR NEGATIVE NEGATIVE Final    Comment: (NOTE) SARS-CoV-2 target nucleic acids are NOT DETECTED.  The SARS-CoV-2 RNA is generally detectable in upper respiratory specimens during the acute phase of infection. The lowest concentration of SARS-CoV-2 viral copies this assay can detect is 138 copies/mL. A negative result does not preclude SARS-Cov-2 infection and should not be used as the sole basis for treatment or other patient management decisions. A negative result may occur with  improper specimen collection/handling, submission of specimen other than nasopharyngeal swab, presence of viral mutation(s) within the areas targeted by this assay, and inadequate number of viral copies(<138 copies/mL). A negative result must be combined with clinical observations, patient history, and epidemiological information. The expected result is Negative.  Fact Sheet for Patients:  EntrepreneurPulse.com.au  Fact Sheet for Healthcare Providers:  IncredibleEmployment.be  This test is no t yet approved or cleared by the [place] FDA and  has been authorized for detection and/or diagnosis of SARS-CoV-2 by FDA under an Emergency Use Authorization (EUA). This EUA will remain  in effect (meaning this test can be used) for the duration of the COVID-19 declaration under Section 564(b)(1) of the Act, 21 U.S.C.section 360bbb-3(b)(1), unless the authorization is terminated  or revoked sooner.       Influenza A by PCR NEGATIVE NEGATIVE Final   Influenza B by PCR NEGATIVE NEGATIVE Final    Comment:  (NOTE) The Xpert Xpress SARS-CoV-2/FLU/RSV plus assay is intended as an aid in the diagnosis of influenza from Nasopharyngeal swab specimens and should not be used as a sole basis for treatment. Nasal washings and aspirates are unacceptable for Xpert Xpress SARS-CoV-2/FLU/RSV testing.  Fact Sheet for Patients: EntrepreneurPulse.com.au  Fact Sheet for Healthcare Providers: IncredibleEmployment.be  This test is not yet approved or cleared by the [place] FDA and has been authorized for detection and/or diagnosis of SARS-CoV-2 by FDA under an Emergency Use Authorization (EUA). This EUA will remain in effect (meaning this test can be used) for the duration of the COVID-19 declaration under Section  564(b)(1) of the Act, 21 U.S.C. section 360bbb-3(b)(1), unless the authorization is terminated or revoked.  Performed at [facility], [place] [address] [place], [place] [address]          Radiology Studies: No results found.      Scheduled Meds:  aspirin EC  81 mg Oral QHS   cloNIDine  0.2 mg Oral QHS   enoxaparin (LOVENOX) injection  30 mg Subcutaneous Q24H   feeding supplement  237 mL Oral BID BM   gabapentin  100 mg Oral Daily   gabapentin  200 mg Oral QHS   metoprolol succinate  25 mg Oral QHS   pantoprazole (PROTONIX) IV  40 mg Intravenous Q24H   rosuvastatin  10 mg Oral Daily   senna-docusate  2 tablet Oral BID   umeclidinium bromide  1 puff Inhalation Daily   Continuous Infusions:  lactated ringers 100 mL/hr at 12/14/21 1017     LOS: 3 days      Phillips Climes, MD [facility]   To contact the attending provider between 7A-7P or the covering provider during after hours 7P-7A, please log into the web site [email or web address] and access using universal [place] password for that web site. If you do not have the password, please call the hospital operator.  12/14/2021, 10:45 AM

## 2021-12-15 LAB — COMPREHENSIVE METABOLIC PANEL
ALT: 15 U/L (ref 0–44)
AST: 23 U/L (ref 15–41)
Albumin: 1.8 g/dL — ABNORMAL LOW (ref 3.5–5.0)
Alkaline Phosphatase: 55 U/L (ref 38–126)
Anion gap: 7 (ref 5–15)
BUN: 8 mg/dL (ref 8–23)
CO2: 28 mmol/L (ref 22–32)
Calcium: 7.8 mg/dL — ABNORMAL LOW (ref 8.9–10.3)
Chloride: 102 mmol/L (ref 98–111)
Creatinine, Ser: 0.8 mg/dL (ref 0.44–1.00)
GFR, Estimated: >60 mL/min (ref >60)
Glucose, Bld: 102 mg/dL — ABNORMAL HIGH (ref 70–99)
Potassium: 3.6 mmol/L (ref 3.5–5.1)
Sodium: 137 mmol/L (ref 135–145)
Total Bilirubin: 0.4 mg/dL (ref 0.3–1.2)
Total Protein: 4.4 g/dL — ABNORMAL LOW (ref 6.5–8.1)

## 2021-12-15 LAB — MAGNESIUM: Magnesium: 1.6 mg/dL — ABNORMAL LOW (ref 1.7–2.4)

## 2021-12-15 LAB — CBC
HCT: 26.2 % — ABNORMAL LOW (ref 36.0–46.0)
Hemoglobin: 8.5 g/dL — ABNORMAL LOW (ref 12.0–15.0)
MCH: 31 pg (ref 26.0–34.0)
MCHC: 32.4 g/dL (ref 30.0–36.0)
MCV: 95.6 fL (ref 80.0–100.0)
Platelets: 263 10*3/uL (ref 150–400)
RBC: 2.74 MIL/uL — ABNORMAL LOW (ref 3.87–5.11)
RDW: 14 % (ref 11.5–15.5)
WBC: 7.7 10*3/uL (ref 4.0–10.5)
nRBC: 0 % (ref 0.0–0.2)

## 2021-12-15 LAB — PHOSPHORUS: Phosphorus: 2.6 mg/dL (ref 2.5–4.6)

## 2021-12-15 MED ORDER — BISACODYL 10 MG RE SUPP
10.0000 mg | Freq: Once | RECTAL | Status: DC
  Filled 2021-12-15: qty 1

## 2021-12-15 MED ORDER — POLYETHYLENE GLYCOL 3350 17 G PO PACK
34.0000 g | PACK | Freq: Two times a day (BID) | ORAL | Status: AC
  Administered 2021-12-15 (×2): 34 g via ORAL
  Filled 2021-12-15 (×2): qty 2

## 2021-12-15 NOTE — Consult Note (Signed)
[facility] Gastroenterology Consult  Referring Provider: Dr. Melissa Noon hospitalist Primary Care Physician:  [name], MD Primary Gastroenterologist: Sadie Haber primary  Reason for Consultation: Recurrent pancreatitis  HPI: Elizabeth [name] is a 81 y.o. female was admitted on 12/11/2021 with complaints of abdominal pain after being discharged on 12/10/2021 for aortobifemoral bypass with aortic endarterectomy and bilateral common femoral endarterectomies on 12/03/2021.  Patient states that within a day of being discharged, she developed severe generalized abdominal pain which radiated to the back and was associated with severe nausea.  On admission she was found to have a lipase of 422 and CAT scan showed pancreatic duct mildly dilated at 4 mm, postoperative changes in sigmoid, scattered colonic diverticulosis, 4.7 x 3 cm retroperitoneal hematoma, likely postoperative, no active bleeding, moderate stool burden.  CT from 11/22: Mild diffuse pancreatic ductal dilatation, 4 mm  CT from 11/17/2020: Pancreatitis with pancreatic and biliary ductal dilatation, MRCP from 11/19/2020: Acute pancreatitis, pancreatic divisum, ascites, pleural effusion, anasarca  Prior GI history: Colonoscopy, 2008, Dr. Timmothy Euler, sigmoid diverticulosis  Patient denies acid reflux or heartburn, denies difficulty swallowing or pain on swallowing. Recently she has noted more constipation, denies noticing blood in stool or black stools. Patient reports nausea and lack of appetite.  Patient states that she drinks alcohol once every 4 to 6 weeks, last alcohol use was first week of January.  She is a social drinker. Normal triglycerides in 1/22 and 12/11/2021.  Past Medical History:  Diagnosis Date   Abdominal cramping    possibly irritable bowel syndrome versus adhesions secondary to multiple surgeries and abdominal inflammation and 2009 secondary to diverticulitis and diverticular phlegmon   Allergic rhinitis    Arthritis     Carotid artery disease (HCC)    27-51% RICA, < 70% LICA 11/10/47 [name] ([facility])   Cataract    Chest pain syndrome 2004   with adenosine Cardiolite that was normal   Chronic hoarseness    COPD (chronic obstructive pulmonary disease) (HCC)    Dyspnea    GERD (gastroesophageal reflux disease)    Heart murmur    Mild AS, AI. AV pk grad 18.79 mmHg, mn grad 11.20 mmHg, AVA (VTI) 1.05 cm2 10/10/20, 3 year f/u rec ([facility])   History of kidney stones    History of renal calculi    Hypercholesterolemia    Hypertension    Incontinence    Cough incontinence   Mild depression    Neuromuscular disorder (HCC)    neuropathy bilateral lower extremities   Osteopenia    Peripheral vascular disease ([place])    with femoral and carotid bruits   PONV (postoperative nausea and vomiting)    Tobacco dependence    Vitamin D deficiency     Past Surgical History:  Procedure Laterality Date   ABDOMINAL AORTOGRAM W/LOWER EXTREMITY N/A 09/18/2021   Procedure: ABDOMINAL AORTOGRAM W/LOWER EXTREMITY;  Surgeon: [name], MD;  Location: [place] [facility];  Service: Cardiovascular;  Laterality: N/A;   AORTA - BILATERAL FEMORAL ARTERY BYPASS GRAFT Bilateral 12/03/2021   Procedure: AORTOBIFEMORAL BYPASS GRAFT USING A 14 X 52mm HEMASHIELD GOLD GRAFT;  Surgeon: [name], MD;  Location: [place];  Service: Vascular;  Laterality: Bilateral;   APPENDECTOMY     BREAST BIOPSY     CATARACT EXTRACTION, BILATERAL     COLON SURGERY     2009   COSMETIC SURGERY     on neck and eyes   DILATION AND CURETTAGE OF UTERUS     EYE  SURGERY     2014   FEMUR SURGERY  12/2007   left femur surgery--for femoral neck fracture status post fall    FRACTURE SURGERY     broke left leg and left arm- 2012   LIPOSUCTION     LUMBAR LAMINECTOMY/DECOMPRESSION MICRODISCECTOMY N/A 11/12/2020   Procedure: LAMINECTOMY AND FORAMINOTOMY Lumbar three four, Lumbar four five, Lumbar Five Sacral one;  Surgeon: Newman Pies, MD;  Location: [place];  Service: Neurosurgery;  Laterality: N/A;   OTHER SURGICAL HISTORY     sigmoid colectomy   SHOULDER SURGERY     arthroscopic shoulder surgery    TONSILLECTOMY     TUBAL LIGATION      Prior to Admission medications   Medication Sig Start Date End Date Taking? Authorizing Provider  amLODipine (NORVASC) 5 MG tablet Take 5 mg by mouth at bedtime. 02/14/20  Yes [provider]  aspirin EC 81 MG tablet Take 81 mg by mouth at bedtime. Swallow whole.   Yes [provider]  b complex vitamins capsule Take 1 capsule by mouth daily.   Yes [provider]  cloNIDine (CATAPRES) 0.1 MG tablet Take 0.2 mg by mouth at bedtime.   Yes [provider]  Coenzyme Q10 100 MG capsule Take 100 mg by mouth every morning.   Yes [provider]  famotidine (PEPCID) 20 MG tablet Take 20-40 mg by mouth daily as needed for heartburn.   Yes [provider]  furosemide (LASIX) 20 MG tablet Take 20 mg by mouth daily as needed for fluid. 11/21/21  Yes [provider]  gabapentin (NEURONTIN) 100 MG capsule Take 2 capsules (200 mg total) by mouth at bedtime. PATIENT NEEDS OFFICE VISIT FOR ADDITIONAL REFILLS Patient taking differently: Take 100-200 mg by mouth See admin instructions. Take one capsule (100 mg) by mouth every morning and two capsules (200 mg) at night 07/30/15  Yes Daub, Loura Back, MD  INCRUSE ELLIPTA 62.5 MCG/INH AEPB Inhale 1 puff into the lungs daily. 11/21/20  Yes Rai, Ripudeep K, MD  irbesartan-hydrochlorothiazide (AVALIDE) 150-12.5 MG per tablet Take 1 tablet by mouth every morning.   Yes [provider]  meloxicam (MOBIC) 15 MG tablet Take 15 mg by mouth at bedtime. 04/01/21  Yes [provider]  Menthol, Topical Analgesic, (ICY HOT EX) Apply 1 application topically daily as needed (pain).   Yes [provider]  metoprolol succinate (TOPROL-XL) 25 MG 24 hr tablet Take 25 mg by mouth at bedtime.   Yes [provider]  Multiple Vitamin (MULTIVITAMIN WITH MINERALS) TABS tablet Take 1 tablet by mouth 2 (two) times daily.   Yes [provider]  simvastatin (ZOCOR) 40 MG tablet Take 40 mg by mouth at bedtime.   Yes [provider]  TURMERIC CURCUMIN PO Take 1 capsule by mouth daily. With Ginger   Yes [provider]  VITAMIN D PO Take 1 capsule by mouth daily.   Yes [provider]  VITAMIN E PO Take 1 capsule by mouth daily.   Yes [provider]  cefUROXime (CEFTIN) 500 MG tablet SMARTSIG:1 Tablet(s) By Mouth Every 12 Hours Patient not taking: Reported on 12/11/2021 11/25/21   [provider]    Current Facility-Administered Medications  Medication Dose Route Frequency Provider Last Rate Last Admin   acetaminophen (TYLENOL) tablet 650 mg  650 mg Oral Q6H PRN Vernelle Emerald, MD   650 mg at 12/14/21 1013   Or   acetaminophen (TYLENOL) suppository 650  mg  650 mg Rectal Q6H PRN Shalhoub, Sherryll Burger, MD       aspirin EC tablet 81 mg  81 mg Oral QHS Vernelle Emerald, MD   81 mg at 12/14/21 2102   bisacodyl (DULCOLAX) suppository 10 mg  10 mg Rectal Once Elgergawy, Silver Huguenin, MD       cloNIDine (CATAPRES) tablet 0.2 mg  0.2 mg Oral QHS Shalhoub, Sherryll Burger, MD   0.2 mg at 12/14/21 2103   enoxaparin (LOVENOX) injection 30 mg  30 mg Subcutaneous Q24H Shalhoub, Sherryll Burger, MD   30 mg at 12/14/21 1014   feeding supplement (ENSURE ENLIVE / ENSURE PLUS) liquid 237 mL  237 mL Oral BID BM Elgergawy, Silver Huguenin, MD   237 mL at 12/14/21 1457   gabapentin (NEURONTIN) capsule 100 mg  100 mg Oral Daily Vernelle Emerald, MD   100 mg at 12/14/21 1012   gabapentin (NEURONTIN) capsule 200 mg  200 mg Oral QHS Vernelle Emerald, MD   200 mg at 12/14/21 2103   hydrALAZINE (APRESOLINE) injection 10 mg  10 mg Intravenous Q6H PRN Shalhoub, Sherryll Burger, MD       HYDROmorphone (DILAUDID) injection 1 mg  1 mg Intravenous Q4H PRN Elgergawy, Silver Huguenin, MD   1 mg at 12/15/21 1132    ipratropium-albuterol (DUONEB) 0.5-2.5 (3) MG/3ML nebulizer solution 3 mL  3 mL Nebulization Q4H PRN Shalhoub, Sherryll Burger, MD       lactated ringers infusion   Intravenous Continuous Shalhoub, Sherryll Burger, MD 100 mL/hr at 12/15/21 0344 Infusion Verify at 12/15/21 0344   metoprolol succinate (TOPROL-XL) 24 hr tablet 25 mg  25 mg Oral QHS Vernelle Emerald, MD   25 mg at 12/14/21 2103   ondansetron (ZOFRAN) tablet 4 mg  4 mg Oral Q6H PRN Vernelle Emerald, MD   4 mg at 12/13/21 1722   Or   ondansetron (ZOFRAN) injection 4 mg  4 mg Intravenous Q6H PRN Vernelle Emerald, MD   4 mg at 12/15/21 1308   pantoprazole (PROTONIX) injection 40 mg  40 mg Intravenous Q24H Vernelle Emerald, MD   40 mg at 12/15/21 0023   polyethylene glycol (MIRALAX / GLYCOLAX) packet 17 g  17 g Oral Daily PRN Vernelle Emerald, MD   17 g at 12/14/21 2103   polyethylene glycol (MIRALAX / GLYCOLAX) packet 34 g  34 g Oral BID Elgergawy, Silver Huguenin, MD       rosuvastatin (CRESTOR) tablet 10 mg  10 mg Oral Daily Elgergawy, Silver Huguenin, MD   10 mg at 12/14/21 1013   senna-docusate (Senokot-S) tablet 2 tablet  2 tablet Oral BID Elgergawy, Silver Huguenin, MD   2 tablet at 12/14/21 2102   umeclidinium bromide (INCRUSE ELLIPTA) 62.5 MCG/ACT 1 puff  1 puff Inhalation Daily Vernelle Emerald, MD   1 puff at 12/15/21 0754    Allergies as of 12/11/2021 - Review Complete 12/11/2021  Allergen Reaction Noted   Other Other (See Comments) 11/21/2013   Pollen extract  04/27/2014   Tegretol [carbamazepine] Swelling 11/21/2013   Lisinopril Rash 11/21/2013   Tetracyclines & related Rash 11/21/2013    Family History  Problem Relation Age of Onset   Heart disease Mother    Stroke Mother     Social History   Socioeconomic History   Marital status: Widowed    Spouse name: Not on file   Number of children: Not on file   Years of education: Not on  file   Highest education level: Not on file  Occupational History   Not on file  Tobacco Use    Smoking status: Every Day    Packs/day: 1.00    Years: 60.00    Pack years: 60.00    Types: Cigarettes   Smokeless tobacco: Never  Vaping Use   Vaping Use: Never used  Substance and Sexual Activity   Alcohol use: Yes    Alcohol/week: 0.0 standard drinks    Comment: Occ glass of wine   Drug use: No   Sexual activity: Not on file  Other Topics Concern   Not on file  Social History Narrative   Not on file   Social Determinants of Health   Financial Resource Strain: Not on file  Food Insecurity: Not on file  Transportation Needs: Not on file  Physical Activity: Not on file  Stress: Not on file  Social Connections: Not on file  Intimate Partner Violence: Not on file    Review of Systems:  GI: Described in detail in HPI.    Gen: Denies any fever, chills, rigors, night sweats, anorexia, fatigue, weakness, malaise, involuntary weight loss, and sleep disorder CV: Denies chest pain, angina, palpitations, syncope, orthopnea, PND, peripheral edema, and claudication. Resp: Denies dyspnea, cough, sputum, wheezing, coughing up blood. GU : Denies urinary burning, blood in urine, urinary frequency, urinary hesitancy, nocturnal urination, and urinary incontinence. MS: Denies joint pain or swelling.  Denies muscle weakness, cramps, atrophy.  Derm: Denies rash, itching, oral ulcerations, hives, unhealing ulcers.  Psych: Denies depression, anxiety, memory loss, suicidal ideation, hallucinations,  and confusion. Heme: Denies bruising, bleeding, and enlarged lymph nodes. Neuro:  Denies any headaches, dizziness, paresthesias. Endo:  Denies any problems with DM, thyroid, adrenal function.  Physical Exam: Vital signs in last 24 hours: Temp:  [98.3 F (36.8 C)-98.7 F (37.1 C)] 98.3 F (36.8 C) (02/12 0918) Pulse Rate:  [74-92] 84 (02/12 0918) Resp:  [16-18] 18 (02/12 0918) BP: (99-134)/(40-55) 120/49 (02/12 0918) SpO2:  [90 %-96 %] 94 % (02/12 0918) Last BM Date: 12/11/21 (per pt, miralax  given this shift)  General:   Elderly, pleasant, cooperative Head:  Normocephalic and atraumatic. Eyes:  Sclera clear, no icterus.   Mild pallor Ears:  Normal auditory acuity. Nose:  No deformity, discharge,  or lesions. Mouth:  No deformity or lesions.  Oropharynx pink & moist. Neck:  Supple; no masses or thyromegaly. Lungs:  Clear throughout to auscultation.   No wheezes, crackles, or rhonchi. No acute distress. Heart:  Regular rate and rhythm; no murmurs, clicks, rubs,  or gallops. Extremities:  Without clubbing or edema. Neurologic:  Alert and  oriented x4;  grossly normal neurologically. Skin:  Intact without significant lesions or rashes. Psych:  Alert and cooperative. Normal mood and affect. Abdomen: Midline surgical incision noted, appears healing, otherwise nondistended, mild generalized tenderness with normoactive bowel sounds     Lab Results: Recent Labs    12/13/21 0314 12/14/21 0120 12/15/21 0303  WBC 11.6* 8.1 7.7  HGB 10.0* 8.7* 8.5*  HCT 31.0* 27.2* 26.2*  PLT 260 253 263   BMET Recent Labs    12/13/21 0314 12/14/21 0120 12/15/21 0303  NA 131* 134* 137  K 4.5 3.9 3.6  CL 100 101 102  CO2 20* 23 28  GLUCOSE 80 96 102*  BUN 13 11 8   CREATININE 0.97 0.87 0.80  CALCIUM 7.8* 7.8* 7.8*   LFT Recent Labs    12/15/21 0303  PROT 4.4*  ALBUMIN  1.8*  AST 23  ALT 15  ALKPHOS 55  BILITOT 0.4   PT/INR No results for input(s): LABPROT, INR in the last 72 hours.  Studies/Results: No results found.  Impression: Recurrent pancreatitis History of pancreatic divisum(no significant alcohol use, no gallstones, normal triglycerides, normal ANA and IgG subclass 4) Normal renal function, normal LFTs  Malnutrition, albumin 1.8, total protein 4.4  Normocytic anemia, hemoglobin 8.5, MCV 95.6, platelet 263  Plan: Patient reports that her nausea and pain is adequately controlled with Zofran 4 mg every 6 hours as needed and Dilaudid 1 mg IV every 4 hours as  needed.  In fact she is requesting for diet to be advanced, will try full liquid diet.  If patient is not able to maintain her nutrition, she may need a postpyloric feeding tube placement. Patient does not want postpyloric feeding tube placement unless absolutely necessary.  Currently she is on lactated Ringer's at 100 cc an hour.  She is on MiraLAX twice a day(17 g, 2 packets, 2 doses for today) thereafter 17 g as needed along with senna 2 tablets twice a day for constipation.  We will continue to follow.   LOS: 4 days   Ronnette Juniper, MD  12/15/2021, 2:40 PM

## 2021-12-15 NOTE — Progress Notes (Signed)
PROGRESS NOTE    Elizabeth [name]  UTM:[id number] DOB: 04/09/1941 DOA: 12/11/2021 PCP: [name], MD    Chief Complaint  Patient presents with   Abdominal Pain    Brief Narrative:    81 year old female with past medical history of COPD, hypertension, gastroesophageal reflux disease, irritable bowel syndrome, pancreatic divisium with previous episodes of acute pancreatitis presented to [facility] emergency department with complaints of left-sided abdominal pain.   Of note, patient was recently hospitalized at [facility] from 1/31 until 2/7 undergoing aorto bifemoral bypass with aortic endarterectomy and bilateral common femoral endarterectomies by Dr. Unk Lightning on 1/31.  Patient slowly improved postoperatively without complication and was eventually discharged on 2/7 home. -Patient work-up was significant for acute pancreatitis, she was admitted for further management.   Assessment & Plan:   Principal Problem:   Acute pancreatitis Active Problems:   COPD (chronic obstructive pulmonary disease) (HCC)   Essential hypertension   Gastro-esophageal reflux disease without esophagitis   Mixed hyperlipidemia   PVD (peripheral vascular disease) (HCC)   Retroperitoneal fluid collection   Elevated troponin level not due myocardial infarction   Acute pancreatitis- (present on admission) - Patient with known history of pancreatic divisum and episodes of acute pancreatitis secondary to this in the past presenting with epigastric and left upper quadrant pain - Markedly elevated lipase suggestive of pancreatitis - Presentation complicated by recent hospitalization for vascular surgery 1/31 until 2/7 initially obscuring diagnosis - Abdominal examination is certainly complicated by the fact the patient has multiple abdominal surgical incisions that are additionally tender. -Triglyceride within normal limit -Does appear patient have these episodes of pancreatitis after stressful  events like hospital stays and major surgery as it was provoked last year by back surgery. -Patient has been on clear liquid diet for last 2 days, but overall she remains with very poor appetite, mainly due to nausea, abdominal pain with no significant change over the last 2 days, she could tolerate 2 ensures yesterday, I will continue her with IV fluids, continue with current pain regimen, we will start her on some laxatives given still persistent constipation, given she is still quite symptomatic with very poor oral intake, I have consulted gastroenterology for further recommendations.  .  Retroperitoneal fluid collection- (present on admission) - Incidental identification of a 4.7 x 3.0 cm left retroperitoneal fluid collection.  No evidence of contrast in this fluid collection making this unlikely to be an active bleed.   -Vascular surgery input appreciated, most likely due to small area of hematoma which is not surprising.  Aortobifemoral bypass.    PVD (peripheral vascular disease) ([place])- (present on admission) -Complicated history of peripheral vascular disease status post recent aortobifemoral bypass with aortic endarterectomy and bilateral common femoral endarterectomies by Dr. Unk Lightning on 1/31   COPD (chronic obstructive pulmonary disease) ([place])- (present on admission) - No evidence of COPD exacerbation this time - Continue home regimen of maintenance inhalers - As needed bronchodilator therapy for episodic shortness of breath and wheezing.     Essential hypertension- (present on admission) -She was resumed on her home medication, blood pressure on the lower side, so I have discontinued irbesartan and Norvasc, meanwhile continue with clonidine and metoprolol - PRN intravenous antihypertensives for excessively elevated blood pressure   Gastro-esophageal reflux disease without esophagitis- (present on admission) - Transition patient from famotidine to intravenous Protonix for now.      Elevated troponin level not due myocardial infarction- (present on admission) -To demand ischemia  Mixed hyperlipidemia- (present on admission) -Continuing home regimen of lipid lowering therapy.   Hypomagnesemia -Level was low at 1.4, was repleted, continue to monitor  Hyponatremia - continue with IV fluids  DVT prophylaxis: Lovenox Code Status: Full Family Communication: None at baseline Disposition:   Status is: Inpatient Remains inpatient appropriate because:  IV fluids and pain med           Consultants:  Gastroenterology  Subjective:  Patient still complains of abdominal pain, she is having poor appetite, and nausea.  Objective: Vitals:   12/14/21 1630 12/14/21 2042 12/15/21 0519 12/15/21 0918  BP: (!) 134/55 (!) 106/52 (!) 99/40 (!) 120/49  Pulse: 92 89 74 84  Resp: 16 17 18 18   Temp: 98.4 F (36.9 C) 98.4 F (36.9 C) 98.7 F (37.1 C) 98.3 F (36.8 C)  TempSrc: Oral Oral    SpO2: 91% 90% 96% 94%  Weight:      Height:        Intake/Output Summary (Last 24 hours) at 12/15/2021 1056 Last data filed at 12/15/2021 4097 Gross per 24 hour  Intake 2702.33 ml  Output --  Net 2702.33 ml   Filed Weights   12/13/21 0414 12/13/21 2316  Weight: 53.7 kg 53.7 kg    Examination:  Awake Alert, Oriented X 3, extremely frail. Symmetrical Chest wall movement, Good air movement bilaterally, CTAB RRR,No Gallops,Rubs or new Murmurs, No Parasternal Heave +ve B.Sounds, midline surgical incision and bilateral groin surgical incision appears to be healing nicely, she is having diffuse abdominal tenderness to palpation. No Cyanosis, Clubbing or edema, No new Rash or bruise       Data Reviewed: I have personally reviewed following labs and imaging studies  CBC: Recent Labs  Lab 12/11/21 2144 12/11/21 2200 12/12/21 1649 12/13/21 0314 12/14/21 0120 12/15/21 0303  WBC 12.0*  --  12.6* 11.6* 8.1 7.7  NEUTROABS 9.5*  --  9.8*  --   --   --   HGB 11.6*  11.6* 10.8* 10.0* 8.7* 8.5*  HCT 34.9* 34.0* 32.7* 31.0* 27.2* 26.2*  MCV 94.8  --  96.5 96.6 97.5 95.6  PLT 300  --  266 260 253 353    Basic Metabolic Panel: Recent Labs  Lab 12/11/21 2144 12/11/21 2200 12/12/21 1649 12/13/21 0314 12/14/21 0120 12/15/21 0303  NA 134* 137 135 131* 134* 137  K 3.6 3.6 4.2 4.5 3.9 3.6  CL 101 101 100 100 101 102  CO2 21*  --  27 20* 23 28  GLUCOSE 142* 139* 115* 80 96 102*  BUN 12 11 9 13 11 8   CREATININE 0.89 0.90 0.89 0.97 0.87 0.80  CALCIUM 8.9  --  8.4* 7.8* 7.8* 7.8*  MG  --   --  1.4*  --  1.9 1.6*  PHOS  --   --   --   --  2.8 2.6    GFR: Estimated Creatinine Clearance: 39.6 mL/min (by C-G formula based on SCr of 0.8 mg/dL).  Liver Function Tests: Recent Labs  Lab 12/11/21 2144 12/12/21 1649 12/13/21 0314 12/14/21 0120 12/15/21 0303  AST 45* 28 33 18 23  ALT 32 24 15 12 15   ALKPHOS 79 69 70 59 55  BILITOT 0.8 0.8 1.6* 0.8 0.4  PROT 6.0* 5.3* 4.8* 4.5* 4.4*  ALBUMIN 3.0* 2.6* 2.1* 1.9* 1.8*    CBG: No results for input(s): GLUCAP in the last 168 hours.   Recent Results (from the past 240 hour(s))  Culture, blood (routine x  2)     Status: None (Preliminary result)   Collection Time: 12/12/21 12:45 AM   Specimen: BLOOD  Result Value Ref Range Status   Specimen Description BLOOD RIGHT ARM  Final   Special Requests   Final    BOTTLES DRAWN AEROBIC AND ANAEROBIC Blood Culture results may not be optimal due to an inadequate volume of blood received in culture bottles   Culture   Final    NO GROWTH 3 DAYS Performed at [facility], [place] [address] [place], [place] [address]    Report Status PENDING  Incomplete  Culture, blood (routine x 2)     Status: None (Preliminary result)   Collection Time: 12/12/21 12:50 AM   Specimen: BLOOD  Result Value Ref Range Status   Specimen Description BLOOD LEFT ARM  Final   Special Requests   Final    BOTTLES DRAWN AEROBIC AND ANAEROBIC Blood Culture results may not be optimal  due to an inadequate volume of blood received in culture bottles   Culture   Final    NO GROWTH 3 DAYS Performed at [facility], [place] [address], [place], [place] [address]    Report Status PENDING  Incomplete  Resp Panel by RT-PCR (Flu A&B, Covid) Nasopharyngeal Swab     Status: None   Collection Time: 12/12/21 12:53 PM   Specimen: Nasopharyngeal Swab; Nasopharyngeal(NP) swabs in vial transport medium  Result Value Ref Range Status   SARS Coronavirus 2 by RT PCR NEGATIVE NEGATIVE Final    Comment: (NOTE) SARS-CoV-2 target nucleic acids are NOT DETECTED.  The SARS-CoV-2 RNA is generally detectable in upper respiratory specimens during the acute phase of infection. The lowest concentration of SARS-CoV-2 viral copies this assay can detect is 138 copies/mL. A negative result does not preclude SARS-Cov-2 infection and should not be used as the sole basis for treatment or other patient management decisions. A negative result may occur with  improper specimen collection/handling, submission of specimen other than nasopharyngeal swab, presence of viral mutation(s) within the areas targeted by this assay, and inadequate number of viral copies(<138 copies/mL). A negative result must be combined with clinical observations, patient history, and epidemiological information. The expected result is Negative.  Fact Sheet for Patients:  EntrepreneurPulse.com.au  Fact Sheet for Healthcare Providers:  IncredibleEmployment.be  This test is no t yet approved or cleared by the [place] FDA and  has been authorized for detection and/or diagnosis of SARS-CoV-2 by FDA under an Emergency Use Authorization (EUA). This EUA will remain  in effect (meaning this test can be used) for the duration of the COVID-19 declaration under Section 564(b)(1) of the Act, 21 U.S.C.section 360bbb-3(b)(1), unless the authorization is terminated  or revoked sooner.        Influenza A by PCR NEGATIVE NEGATIVE Final   Influenza B by PCR NEGATIVE NEGATIVE Final    Comment: (NOTE) The Xpert Xpress SARS-CoV-2/FLU/RSV plus assay is intended as an aid in the diagnosis of influenza from Nasopharyngeal swab specimens and should not be used as a sole basis for treatment. Nasal washings and aspirates are unacceptable for Xpert Xpress SARS-CoV-2/FLU/RSV testing.  Fact Sheet for Patients: EntrepreneurPulse.com.au  Fact Sheet for Healthcare Providers: IncredibleEmployment.be  This test is not yet approved or cleared by the [place] FDA and has been authorized for detection and/or diagnosis of SARS-CoV-2 by FDA under an Emergency Use Authorization (EUA). This EUA will remain in effect (meaning this test can be used) for the duration of  the COVID-19 declaration under Section 564(b)(1) of the Act, 21 U.S.C. section 360bbb-3(b)(1), unless the authorization is terminated or revoked.  Performed at [facility], [place] [address], [place], [place] [address]          Radiology Studies: No results found.      Scheduled Meds:  aspirin EC  81 mg Oral QHS   cloNIDine  0.2 mg Oral QHS   enoxaparin (LOVENOX) injection  30 mg Subcutaneous Q24H   feeding supplement  237 mL Oral BID BM   gabapentin  100 mg Oral Daily   gabapentin  200 mg Oral QHS   metoprolol succinate  25 mg Oral QHS   pantoprazole (PROTONIX) IV  40 mg Intravenous Q24H   rosuvastatin  10 mg Oral Daily   senna-docusate  2 tablet Oral BID   umeclidinium bromide  1 puff Inhalation Daily   Continuous Infusions:  lactated ringers 100 mL/hr at 12/15/21 0344     LOS: 4 days      Phillips Climes, MD [facility]   To contact the attending provider between 7A-7P or the covering provider during after hours 7P-7A, please log into the web site [email or web address] and access using universal [place] password for that web site. If you do not have the  password, please call the hospital operator.  12/15/2021, 10:56 AM

## 2021-12-16 LAB — CBC
HCT: 26.3 % — ABNORMAL LOW (ref 36.0–46.0)
Hemoglobin: 8.7 g/dL — ABNORMAL LOW (ref 12.0–15.0)
MCH: 32 pg (ref 26.0–34.0)
MCHC: 33.1 g/dL (ref 30.0–36.0)
MCV: 96.7 fL (ref 80.0–100.0)
Platelets: 276 10*3/uL (ref 150–400)
RBC: 2.72 MIL/uL — ABNORMAL LOW (ref 3.87–5.11)
RDW: 13.7 % (ref 11.5–15.5)
WBC: 6.2 10*3/uL (ref 4.0–10.5)
nRBC: 0 % (ref 0.0–0.2)

## 2021-12-16 LAB — COMPREHENSIVE METABOLIC PANEL
ALT: 15 U/L (ref 0–44)
AST: 22 U/L (ref 15–41)
Albumin: 1.8 g/dL — ABNORMAL LOW (ref 3.5–5.0)
Alkaline Phosphatase: 53 U/L (ref 38–126)
Anion gap: 6 (ref 5–15)
BUN: 6 mg/dL — ABNORMAL LOW (ref 8–23)
CO2: 27 mmol/L (ref 22–32)
Calcium: 7.9 mg/dL — ABNORMAL LOW (ref 8.9–10.3)
Chloride: 104 mmol/L (ref 98–111)
Creatinine, Ser: 0.81 mg/dL (ref 0.44–1.00)
GFR, Estimated: >60 mL/min (ref >60)
Glucose, Bld: 112 mg/dL — ABNORMAL HIGH (ref 70–99)
Potassium: 3.9 mmol/L (ref 3.5–5.1)
Sodium: 137 mmol/L (ref 135–145)
Total Bilirubin: 0.3 mg/dL (ref 0.3–1.2)
Total Protein: 4.4 g/dL — ABNORMAL LOW (ref 6.5–8.1)

## 2021-12-16 LAB — LIPASE, BLOOD: Lipase: 31 U/L (ref 11–51)

## 2021-12-16 MED ORDER — PANTOPRAZOLE SODIUM 40 MG PO TBEC
40.0000 mg | DELAYED_RELEASE_TABLET | Freq: Every day | ORAL | Status: DC
  Administered 2021-12-17 – 2021-12-18 (×2): 40 mg via ORAL
  Filled 2021-12-16 (×2): qty 1

## 2021-12-16 MED ORDER — ENSURE ENLIVE PO LIQD
237.0000 mL | Freq: Three times a day (TID) | ORAL | Status: DC
  Administered 2021-12-16 – 2021-12-18 (×7): 237 mL via ORAL

## 2021-12-16 MED ORDER — POLYETHYLENE GLYCOL 3350 17 G PO PACK
17.0000 g | PACK | Freq: Once | ORAL | Status: AC
  Administered 2021-12-16: 17 g via ORAL
  Filled 2021-12-16: qty 1

## 2021-12-16 MED ORDER — PANCRELIPASE (LIP-PROT-AMYL) 36000-114000 UNITS PO CPEP
36000.0000 [IU] | ORAL_CAPSULE | Freq: Three times a day (TID) | ORAL | Status: DC
  Administered 2021-12-16 – 2021-12-19 (×9): 36000 [IU] via ORAL
  Filled 2021-12-16 (×10): qty 1

## 2021-12-16 NOTE — Progress Notes (Signed)
[facility] Gastroenterology Progress Note  Elizabeth [name] 81 y.o. [birth year]  CC: Recurrent pancreatitis   Subjective: Patient seen and examined at bedside.  Feeling somewhat better today.  Continues to have abdominal pain but has improved compared to yesterday.  Now on full liquid diet.  Last bowel movement yesterday.  ROS : Febrile, negative for vomiting.   Objective: Vital signs in last 24 hours: Vitals:   12/16/21 0434 12/16/21 0913  BP: (!) 100/50 (!) 127/49  Pulse: 67 79  Resp: 16 18  Temp: 98.7 F (37.1 C) 98.3 F (36.8 C)  SpO2: 95% 91%    Physical Exam:  General:  Alert, cooperative, no distress, appears stated age  Head:  Normocephalic, without obvious abnormality, atraumatic  Eyes:  , EOM's intact,   Lungs:   No visible respiratory distress  Heart:  Regular rate and rhythm, S1, S2 normal  Abdomen:   Soft, epigastric tenderness to palpation, bowel sounds present, no peritoneal signs  Extremities: Extremities normal, atraumatic, no  edema  Pulses: 2+ and symmetric    Lab Results: Recent Labs    12/14/21 0120 12/15/21 0303 12/16/21 0255  NA 134* 137 137  K 3.9 3.6 3.9  CL 101 102 104  CO2 23 28 27   GLUCOSE 96 102* 112*  BUN 11 8 6*  CREATININE 0.87 0.80 0.81  CALCIUM 7.8* 7.8* 7.9*  MG 1.9 1.6*  --   PHOS 2.8 2.6  --    Recent Labs    12/15/21 0303 12/16/21 0255  AST 23 22  ALT 15 15  ALKPHOS 55 53  BILITOT 0.4 0.3  PROT 4.4* 4.4*  ALBUMIN 1.8* 1.8*   Recent Labs    12/15/21 0303 12/16/21 0255  WBC 7.7 6.2  HGB 8.5* 8.7*  HCT 26.2* 26.3*  MCV 95.6 96.7  PLT 263 276   No results for input(s): LABPROT, INR in the last 72 hours.    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  No significant alcohol use.  Normal triglycerides.  No gallstones.  Normal IgG4. -History of peripheral arterial disease s/p aortofemoral bypass and bilateral common femoral endarterectomies on December 03, 2021.   Recommendations ------------------------ -Continue supportive care for now -Fully advance diet as tolerated to soft.  Low-fat diet. -Add Creon because of history of recurrent pancreatitis -Hopefully discharge soon.  GI will follow   Otis Brace MD, [place] 12/16/2021, 9:24 AM  Contact #  [phone]

## 2021-12-16 NOTE — Care Management Important Message (Signed)
Important Message  Patient Details  Name: Elizabeth [name] MRN: [id number] Date of Birth: 1941-03-24   Medicare Important Message Given:  Yes     Michi Herrmann [address] 12/16/2021, 3:44 PM

## 2021-12-16 NOTE — Progress Notes (Signed)
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   Of note, patient was recently hospitalized at [facility] from 1/31 until 2/7 undergoing aorto bifemoral bypass with aortic endarterectomy and bilateral common femoral endarterectomies by Dr. Unk Lightning on 1/31.  Patient slowly improved postoperatively without complication and was eventually discharged on 2/7 home. -Patient work-up was significant for acute pancreatitis, she was admitted for further management.   Assessment & Plan:   Principal Problem:   Acute pancreatitis Active Problems:   COPD (chronic obstructive pulmonary disease) (HCC)   Essential hypertension   Gastro-esophageal reflux disease without esophagitis   Mixed hyperlipidemia   PVD (peripheral vascular disease) (HCC)   Retroperitoneal fluid collection   Elevated troponin level not due myocardial infarction   Acute pancreatitis- (present on admission) - Patient with known history of pancreatic divisum and episodes of acute pancreatitis secondary to this in the past presenting with epigastric and left upper quadrant pain - Markedly elevated lipase suggestive of pancreatitis - Presentation complicated by recent hospitalization for vascular surgery 1/31 until 2/7 initially obscuring diagnosis - 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  No significant alcohol use.  Normal triglycerides.  No gallstones.  Normal IgG4. -GI input greatly appreciated, advance diet as tolerated, continue with IV fluids and IV as needed pain  control.  Retroperitoneal fluid collection- (present on admission) - Incidental identification of a 4.7 x 3.0 cm left retroperitoneal fluid collection.  No evidence of contrast in this fluid collection making this unlikely to be an active bleed.   -Vascular surgery input appreciated, most likely due to small area of hematoma which is not surprising.  Aortobifemoral bypass.    PVD (peripheral vascular disease) ([place])- (present on admission) -Complicated history of peripheral vascular disease status post recent aortobifemoral bypass with aortic endarterectomy and bilateral common femoral endarterectomies by Dr. Unk Lightning on 1/31   COPD (chronic obstructive pulmonary disease) ([place])- (present on admission) - No evidence of COPD exacerbation this time - Continue home regimen of maintenance inhalers - As needed bronchodilator therapy for episodic shortness of breath and wheezing.     Essential hypertension- (present on admission) -She was resumed on her home medication, blood pressure on the lower side, so I have discontinued irbesartan and Norvasc, meanwhile continue with clonidine and metoprolol - PRN intravenous antihypertensives for excessively elevated blood pressure   Gastro-esophageal reflux disease without esophagitis- (present on admission) - Transition patient from famotidine to intravenous Protonix for now.     Elevated troponin level not due myocardial infarction- (present on admission) -To demand ischemia     Mixed hyperlipidemia- (present on admission) -Continuing home regimen of lipid lowering therapy.   Hypomagnesemia -repleted, continue to monitor  Hyponatremia - continue with IV fluids  Protein calorie malnutrition -Continue with Ensure  DVT prophylaxis: Lovenox Code Status: Full Family Communication: None at baseline Disposition:   Status is: Inpatient Remains inpatient appropriate because:  IV fluids and pain med           Consultants:   Gastroenterology  Subjective:  Had small BM yesterday with laxatives, she was able to drink 1 Ensure, asking to  advance her diet as clear liquid diet and thinks she will do better full liquid or soft.    Objective: Vitals:   12/15/21 1646 12/15/21 2105 12/16/21 0434 12/16/21 0913  BP: 105/60 135/66 (!) 100/50 (!) 127/49  Pulse: 84 92 67 79  Resp: 17 18 16 18   Temp: 98.1 F (36.7 C) 98.9 F (37.2 C) 98.7 F (37.1 C) 98.3 F (36.8 C)  TempSrc: Oral Oral Oral   SpO2: 94% 91% 95% 91%  Weight:      Height:        Intake/Output Summary (Last 24 hours) at 12/16/2021 1014 Last data filed at 12/16/2021 0902 Gross per 24 hour  Intake 717 ml  Output --  Net 717 ml   Filed Weights   12/13/21 0414 12/13/21 2316  Weight: 53.7 kg 53.7 kg    Examination:  Awake Alert, Oriented X 3, frail, thin appearing Symmetrical Chest wall movement, Good air movement bilaterally, CTAB RRR,No Gallops,Rubs or new Murmurs, No Parasternal Heave +ve B.Sounds, Abd with mild diffuse tenderness to palpation, surgical incisions in mid abdomen and bilateral groin area appears to be intact and healing nicely.   No Cyanosis, Clubbing or edema, No new Rash or bruise        Data Reviewed: I have personally reviewed following labs and imaging studies  CBC: Recent Labs  Lab 12/11/21 2144 12/11/21 2200 12/12/21 1649 12/13/21 0314 12/14/21 0120 12/15/21 0303 12/16/21 0255  WBC 12.0*  --  12.6* 11.6* 8.1 7.7 6.2  NEUTROABS 9.5*  --  9.8*  --   --   --   --   HGB 11.6*   < > 10.8* 10.0* 8.7* 8.5* 8.7*  HCT 34.9*   < > 32.7* 31.0* 27.2* 26.2* 26.3*  MCV 94.8  --  96.5 96.6 97.5 95.6 96.7  PLT 300  --  266 260 253 263 276   < > = values in this interval not displayed.    Basic Metabolic Panel: Recent Labs  Lab 12/12/21 1649 12/13/21 0314 12/14/21 0120 12/15/21 0303 12/16/21 0255  NA 135 131* 134* 137 137  K 4.2 4.5 3.9 3.6 3.9  CL 100 100 101 102 104  CO2 27 20* 23 28 27   GLUCOSE 115* 80  96 102* 112*  BUN 9 13 11 8  6*  CREATININE 0.89 0.97 0.87 0.80 0.81  CALCIUM 8.4* 7.8* 7.8* 7.8* 7.9*  MG 1.4*  --  1.9 1.6*  --   PHOS  --   --  2.8 2.6  --     GFR: Estimated Creatinine Clearance: 39.1 mL/min (by C-G formula based on SCr of 0.81 mg/dL).  Liver Function Tests: Recent Labs  Lab 12/12/21 1649 12/13/21 0314 12/14/21 0120 12/15/21 0303 12/16/21 0255  AST 28 33 18 23 22   ALT 24 15 12 15 15   ALKPHOS 69 70 59 55 53  BILITOT 0.8 1.6* 0.8 0.4 0.3  PROT 5.3* 4.8* 4.5* 4.4* 4.4*  ALBUMIN 2.6* 2.1* 1.9* 1.8* 1.8*    CBG: No results for input(s): GLUCAP in the last 168 hours.   Recent Results (from the past 240 hour(s))  Culture, blood (routine x 2)     Status: None (Preliminary result)   Collection Time: 12/12/21 12:45 AM   Specimen: BLOOD  Result Value Ref Range Status   Specimen Description BLOOD RIGHT ARM  Final   Special Requests   Final    BOTTLES DRAWN AEROBIC AND ANAEROBIC Blood Culture results may not be optimal due to  an inadequate volume of blood received in culture bottles   Culture   Final    NO GROWTH 4 DAYS Performed at [facility], [place] [address], [place], [place] [address]    Report Status PENDING  Incomplete  Culture, blood (routine x 2)     Status: None (Preliminary result)   Collection Time: 12/12/21 12:50 AM   Specimen: BLOOD  Result Value Ref Range Status   Specimen Description BLOOD LEFT ARM  Final   Special Requests   Final    BOTTLES DRAWN AEROBIC AND ANAEROBIC Blood Culture results may not be optimal due to an inadequate volume of blood received in culture bottles   Culture   Final    NO GROWTH 4 DAYS Performed at [facility], [place] [address], [place], [place] [address]    Report Status PENDING  Incomplete  Resp Panel by RT-PCR (Flu A&B, Covid) Nasopharyngeal Swab     Status: None   Collection Time: 12/12/21 12:53 PM   Specimen: Nasopharyngeal Swab; Nasopharyngeal(NP) swabs in vial transport medium  Result Value  Ref Range Status   SARS Coronavirus 2 by RT PCR NEGATIVE NEGATIVE Final    Comment: (NOTE) SARS-CoV-2 target nucleic acids are NOT DETECTED.  The SARS-CoV-2 RNA is generally detectable in upper respiratory specimens during the acute phase of infection. The lowest concentration of SARS-CoV-2 viral copies this assay can detect is 138 copies/mL. A negative result does not preclude SARS-Cov-2 infection and should not be used as the sole basis for treatment or other patient management decisions. A negative result may occur with  improper specimen collection/handling, submission of specimen other than nasopharyngeal swab, presence of viral mutation(s) within the areas targeted by this assay, and inadequate number of viral copies(<138 copies/mL). A negative result must be combined with clinical observations, patient history, and epidemiological information. The expected result is Negative.  Fact Sheet for Patients:  EntrepreneurPulse.com.au  Fact Sheet for Healthcare Providers:  IncredibleEmployment.be  This test is no t yet approved or cleared by the [place] FDA and  has been authorized for detection and/or diagnosis of SARS-CoV-2 by FDA under an Emergency Use Authorization (EUA). This EUA will remain  in effect (meaning this test can be used) for the duration of the COVID-19 declaration under Section 564(b)(1) of the Act, 21 U.S.C.section 360bbb-3(b)(1), unless the authorization is terminated  or revoked sooner.       Influenza A by PCR NEGATIVE NEGATIVE Final   Influenza B by PCR NEGATIVE NEGATIVE Final    Comment: (NOTE) The Xpert Xpress SARS-CoV-2/FLU/RSV plus assay is intended as an aid in the diagnosis of influenza from Nasopharyngeal swab specimens and should not be used as a sole basis for treatment. Nasal washings and aspirates are unacceptable for Xpert Xpress SARS-CoV-2/FLU/RSV testing.  Fact Sheet for  Patients: EntrepreneurPulse.com.au  Fact Sheet for Healthcare Providers: IncredibleEmployment.be  This test is not yet approved or cleared by the [place] FDA and has been authorized for detection and/or diagnosis of SARS-CoV-2 by FDA under an Emergency Use Authorization (EUA). This EUA will remain in effect (meaning this test can be used) for the duration of the COVID-19 declaration under Section 564(b)(1) of the Act, 21 U.S.C. section 360bbb-3(b)(1), unless the authorization is terminated or revoked.  Performed at [facility], [place] [address], [place], [place] [address]          Radiology Studies: No results found.      Scheduled Meds:  aspirin EC  81 mg Oral QHS   bisacodyl  10 mg Rectal Once   cloNIDine  0.2 mg Oral QHS   enoxaparin (LOVENOX) injection  30 mg Subcutaneous Q24H   feeding supplement  237 mL Oral TID BM   gabapentin  100 mg Oral Daily   gabapentin  200 mg Oral QHS   lipase/protease/amylase  36,000 Units Oral TID AC   metoprolol succinate  25 mg Oral QHS   pantoprazole (PROTONIX) IV  40 mg Intravenous Q24H   polyethylene glycol  17 g Oral Once   rosuvastatin  10 mg Oral Daily   senna-docusate  2 tablet Oral BID   umeclidinium bromide  1 puff Inhalation Daily   Continuous Infusions:  lactated ringers 100 mL/hr at 12/15/21 0344     LOS: 5 days      Phillips Climes, MD [facility]   To contact the attending provider between 7A-7P or the covering provider during after hours 7P-7A, please log into the web site [email or web address] and access using universal [place] password for that web site. If you do not have the password, please call the hospital operator.  12/16/2021, 10:14 AM

## 2021-12-17 LAB — COMPREHENSIVE METABOLIC PANEL
ALT: 13 U/L (ref 0–44)
AST: 20 U/L (ref 15–41)
Albumin: 2 g/dL — ABNORMAL LOW (ref 3.5–5.0)
Alkaline Phosphatase: 55 U/L (ref 38–126)
Anion gap: 7 (ref 5–15)
BUN: 8 mg/dL (ref 8–23)
CO2: 28 mmol/L (ref 22–32)
Calcium: 8.7 mg/dL — ABNORMAL LOW (ref 8.9–10.3)
Chloride: 102 mmol/L (ref 98–111)
Creatinine, Ser: 0.72 mg/dL (ref 0.44–1.00)
GFR, Estimated: >60 mL/min (ref >60)
Glucose, Bld: 102 mg/dL — ABNORMAL HIGH (ref 70–99)
Potassium: 4.1 mmol/L (ref 3.5–5.1)
Sodium: 137 mmol/L (ref 135–145)
Total Bilirubin: 0.2 mg/dL — ABNORMAL LOW (ref 0.3–1.2)
Total Protein: 4.8 g/dL — ABNORMAL LOW (ref 6.5–8.1)

## 2021-12-17 LAB — CBC
HCT: 28.4 % — ABNORMAL LOW (ref 36.0–46.0)
Hemoglobin: 8.9 g/dL — ABNORMAL LOW (ref 12.0–15.0)
MCH: 30.7 pg (ref 26.0–34.0)
MCHC: 31.3 g/dL (ref 30.0–36.0)
MCV: 97.9 fL (ref 80.0–100.0)
Platelets: 288 10*3/uL (ref 150–400)
RBC: 2.9 MIL/uL — ABNORMAL LOW (ref 3.87–5.11)
RDW: 13.7 % (ref 11.5–15.5)
WBC: 6.8 10*3/uL (ref 4.0–10.5)
nRBC: 0 % (ref 0.0–0.2)

## 2021-12-17 LAB — CULTURE, BLOOD (ROUTINE X 2)
Culture: NO GROWTH
Culture: NO GROWTH

## 2021-12-17 LAB — PHOSPHORUS: Phosphorus: 3.6 mg/dL (ref 2.5–4.6)

## 2021-12-17 LAB — MAGNESIUM: Magnesium: 1.6 mg/dL — ABNORMAL LOW (ref 1.7–2.4)

## 2021-12-17 MED ORDER — HYDROMORPHONE HCL 1 MG/ML IJ SOLN
0.5000 mg | INTRAMUSCULAR | Status: DC | PRN
  Administered 2021-12-17: 0.5 mg via INTRAVENOUS
  Filled 2021-12-17: qty 1

## 2021-12-17 NOTE — Progress Notes (Signed)
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   Of note, patient was recently hospitalized at [facility] from 1/31 until 2/7 undergoing aorto bifemoral bypass with aortic endarterectomy and bilateral common femoral endarterectomies by Dr. Unk Lightning on 1/31.  Patient slowly improved postoperatively without complication and was eventually discharged on 2/7 home. -Patient work-up was significant for acute pancreatitis, she was admitted for further management.   Assessment & Plan:   Principal Problem:   Acute pancreatitis Active Problems:   COPD (chronic obstructive pulmonary disease) (HCC)   Essential hypertension   Gastro-esophageal reflux disease without esophagitis   Mixed hyperlipidemia   PVD (peripheral vascular disease) (HCC)   Retroperitoneal fluid collection   Elevated troponin level not due myocardial infarction   Acute pancreatitis- (present on admission) - Patient with known history of pancreatic divisum and episodes of acute pancreatitis secondary to this in the past presenting with epigastric and left upper quadrant pain - Markedly elevated lipase suggestive of pancreatitis - Presentation complicated by recent hospitalization for vascular surgery 1/31 until 2/7 initially obscuring diagnosis - 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  No significant alcohol use.  Normal triglycerides.  No gallstones.  Normal IgG4. -GI input greatly appreciated. -She remains with significant abdominal pain, but she has been hesitant to ask  for pain medicine(due to history of some family members becoming), I have discussed with her, I will lower her Dilaudid dose, but she would ask for it whenever she is in significant pain. -Continue with IV fluids, continue with as needed pain medications, encouraged to drink her Ensure.  -Was started on pancrelipase yesterday, reports she is feeling better once it was started.  Retroperitoneal fluid collection- (present on admission) - Incidental identification of a 4.7 x 3.0 cm left retroperitoneal fluid collection.  No evidence of contrast in this fluid collection making this unlikely to be an active bleed.   -Vascular surgery input appreciated, most likely due to small area of hematoma which is not surprising.  Aortobifemoral bypass.    PVD (peripheral vascular disease) ([place])- (present on admission) -Complicated history of peripheral vascular disease status post recent aortobifemoral bypass with aortic endarterectomy and bilateral common femoral endarterectomies by Dr. Unk Lightning on 1/31   COPD (chronic obstructive pulmonary disease) ([place])- (present on admission) - No evidence of COPD exacerbation this time - Continue home regimen of maintenance inhalers - As needed bronchodilator therapy for episodic shortness of breath and wheezing.     Essential hypertension- (present on admission) -She was resumed on her home medication, blood pressure on the lower side, so I have discontinued irbesartan and Norvasc, meanwhile continue with clonidine and metoprolol - PRN intravenous antihypertensives for excessively elevated blood pressure   Gastro-esophageal reflux disease without esophagitis- (present on admission) - Transition patient from famotidine to intravenous Protonix for now.     Elevated troponin level not due myocardial infarction- (present on admission) -To demand ischemia     Mixed hyperlipidemia- (present on admission) -Continuing home regimen of lipid lowering therapy.    Hypomagnesemia -repleted, continue to monitor  Hyponatremia - continue with IV fluids  Protein calorie malnutrition -  Continue with Ensure  DVT prophylaxis: Lovenox Code Status: Full Family Communication: None at baseline Disposition:   Status is: Inpatient Remains inpatient appropriate because:  IV fluids and pain med           Consultants:  Gastroenterology  Subjective:  Patient reports she is feeling better after starting pancrelipase yesterday, she able to tolerate some grits this morning, she drank 2 ensures yesterday.    Objective: Vitals:   12/17/21 0613 12/17/21 0700 12/17/21 0824 12/17/21 0918  BP: 108/64   (!) 118/54  Pulse: 72   70  Resp: 18 18  17   Temp: 98.2 F (36.8 C) 98.2 F (36.8 C)  98.3 F (36.8 C)  TempSrc:  Oral  Oral  SpO2: 95% 95% 94% 94%  Weight:      Height:        Intake/Output Summary (Last 24 hours) at 12/17/2021 1057 Last data filed at 12/17/2021 0200 Gross per 24 hour  Intake 600 ml  Output --  Net 600 ml   Filed Weights   12/13/21 0414 12/13/21 2316  Weight: 53.7 kg 53.7 kg    Examination:  Awake Alert, Oriented X 3, frail. Symmetrical Chest wall movement, Good air movement bilaterally, CTAB RRR,No Gallops,Rubs or new Murmurs, No Parasternal Heave +ve B.Sounds, remains diffusely tender, midline/bilateral surgical incision with no oozing or discharge, healing nicely. No Cyanosis, Clubbing or edema, No new Rash or bruise         Data Reviewed: I have personally reviewed following labs and imaging studies  CBC: Recent Labs  Lab 12/11/21 2144 12/11/21 2200 12/12/21 1649 12/13/21 0314 12/14/21 0120 12/15/21 0303 12/16/21 0255 12/17/21 0741  WBC 12.0*  --  12.6* 11.6* 8.1 7.7 6.2 6.8  NEUTROABS 9.5*  --  9.8*  --   --   --   --   --   HGB 11.6*   < > 10.8* 10.0* 8.7* 8.5* 8.7* 8.9*  HCT 34.9*   < > 32.7* 31.0* 27.2* 26.2* 26.3* 28.4*  MCV 94.8  --  96.5 96.6 97.5 95.6 96.7 97.9  PLT 300  --  266 260 253  263 276 288   < > = values in this interval not displayed.    Basic Metabolic Panel: Recent Labs  Lab 12/12/21 1649 12/13/21 0314 12/14/21 0120 12/15/21 0303 12/16/21 0255 12/17/21 0741  NA 135 131* 134* 137 137 137  K 4.2 4.5 3.9 3.6 3.9 4.1  CL 100 100 101 102 104 102  CO2 27 20* 23 28 27 28   GLUCOSE 115* 80 96 102* 112* 102*  BUN 9 13 11 8  6* 8  CREATININE 0.89 0.97 0.87 0.80 0.81 0.72  CALCIUM 8.4* 7.8* 7.8* 7.8* 7.9* 8.7*  MG 1.4*  --  1.9 1.6*  --  1.6*  PHOS  --   --  2.8 2.6  --  3.6    GFR: Estimated Creatinine Clearance: 39.6 mL/min (by C-G formula based on SCr of 0.72 mg/dL).  Liver Function Tests: Recent Labs  Lab 12/13/21 0314 12/14/21 0120 12/15/21 0303 12/16/21 0255 12/17/21 0741  AST 33 18 23 22 20   ALT 15 12 15 15 13   ALKPHOS 70 59 55 53 55  BILITOT 1.6* 0.8 0.4 0.3 0.2*  PROT 4.8* 4.5* 4.4* 4.4* 4.8*  ALBUMIN 2.1* 1.9* 1.8* 1.8* 2.0*    CBG: No results for input(s): GLUCAP in the last 168 hours.   Recent Results (from the past 240 hour(s))  Culture, blood (routine x 2)  Status: None   Collection Time: 12/12/21 12:45 AM   Specimen: BLOOD  Result Value Ref Range Status   Specimen Description BLOOD RIGHT ARM  Final   Special Requests   Final    BOTTLES DRAWN AEROBIC AND ANAEROBIC Blood Culture results may not be optimal due to an inadequate volume of blood received in culture bottles   Culture   Final    NO GROWTH 5 DAYS Performed at [facility], [place] [address], [place], [place] [address]    Report Status 12/17/2021 FINAL  Final  Culture, blood (routine x 2)     Status: None   Collection Time: 12/12/21 12:50 AM   Specimen: BLOOD  Result Value Ref Range Status   Specimen Description BLOOD LEFT ARM  Final   Special Requests   Final    BOTTLES DRAWN AEROBIC AND ANAEROBIC Blood Culture results may not be optimal due to an inadequate volume of blood received in culture bottles   Culture   Final    NO GROWTH 5 DAYS Performed at  [facility], [place] [address] [place], [place] [address]    Report Status 12/17/2021 FINAL  Final  Resp Panel by RT-PCR (Flu A&B, Covid) Nasopharyngeal Swab     Status: None   Collection Time: 12/12/21 12:53 PM   Specimen: Nasopharyngeal Swab; Nasopharyngeal(NP) swabs in vial transport medium  Result Value Ref Range Status   SARS Coronavirus 2 by RT PCR NEGATIVE NEGATIVE Final    Comment: (NOTE) SARS-CoV-2 target nucleic acids are NOT DETECTED.  The SARS-CoV-2 RNA is generally detectable in upper respiratory specimens during the acute phase of infection. The lowest concentration of SARS-CoV-2 viral copies this assay can detect is 138 copies/mL. A negative result does not preclude SARS-Cov-2 infection and should not be used as the sole basis for treatment or other patient management decisions. A negative result may occur with  improper specimen collection/handling, submission of specimen other than nasopharyngeal swab, presence of viral mutation(s) within the areas targeted by this assay, and inadequate number of viral copies(<138 copies/mL). A negative result must be combined with clinical observations, patient history, and epidemiological information. The expected result is Negative.  Fact Sheet for Patients:  EntrepreneurPulse.com.au  Fact Sheet for Healthcare Providers:  IncredibleEmployment.be  This test is no t yet approved or cleared by the [place] FDA and  has been authorized for detection and/or diagnosis of SARS-CoV-2 by FDA under an Emergency Use Authorization (EUA). This EUA will remain  in effect (meaning this test can be used) for the duration of the COVID-19 declaration under Section 564(b)(1) of the Act, 21 U.S.C.section 360bbb-3(b)(1), unless the authorization is terminated  or revoked sooner.       Influenza A by PCR NEGATIVE NEGATIVE Final   Influenza B by PCR NEGATIVE NEGATIVE Final    Comment: (NOTE) The  Xpert Xpress SARS-CoV-2/FLU/RSV plus assay is intended as an aid in the diagnosis of influenza from Nasopharyngeal swab specimens and should not be used as a sole basis for treatment. Nasal washings and aspirates are unacceptable for Xpert Xpress SARS-CoV-2/FLU/RSV testing.  Fact Sheet for Patients: EntrepreneurPulse.com.au  Fact Sheet for Healthcare Providers: IncredibleEmployment.be  This test is not yet approved or cleared by the [place] FDA and has been authorized for detection and/or diagnosis of SARS-CoV-2 by FDA under an Emergency Use Authorization (EUA). This EUA will remain in effect (meaning this test can be used) for the duration of the COVID-19 declaration under Section 564(b)(1) of  the Act, 21 U.S.C. section 360bbb-3(b)(1), unless the authorization is terminated or revoked.  Performed at [facility], [place] [address], [place], [place] [address]          Radiology Studies: No results found.      Scheduled Meds:  aspirin EC  81 mg Oral QHS   bisacodyl  10 mg Rectal Once   cloNIDine  0.2 mg Oral QHS   enoxaparin (LOVENOX) injection  30 mg Subcutaneous Q24H   feeding supplement  237 mL Oral TID BM   gabapentin  100 mg Oral Daily   gabapentin  200 mg Oral QHS   lipase/protease/amylase  36,000 Units Oral TID AC   metoprolol succinate  25 mg Oral QHS   pantoprazole  40 mg Oral Daily   rosuvastatin  10 mg Oral Daily   senna-docusate  2 tablet Oral BID   umeclidinium bromide  1 puff Inhalation Daily   Continuous Infusions:  lactated ringers 100 mL/hr at 12/17/21 0200     LOS: 6 days      Phillips Climes, MD [facility]   To contact the attending provider between 7A-7P or the covering provider during after hours 7P-7A, please log into the web site [email or web address] and access using universal [place] password for that web site. If you do not have the password, please call the hospital  operator.  12/17/2021, 10:57 AM

## 2021-12-17 NOTE — Progress Notes (Signed)
[facility] Gastroenterology Progress Note  Elizabeth [name] 81 y.o. Feb 28, 1941  CC: Recurrent pancreatitis   Subjective: Patient seen and examined at bedside.  Feeling better.  Continues to have intermittent abdominal pain.  Had some nausea.  Denies any vomiting.  ROS : afebrile, negative for vomiting.   Objective: Vital signs in last 24 hours: Vitals:   12/17/21 0824 12/17/21 0918  BP:  (!) 118/54  Pulse:  70  Resp:  17  Temp:  98.3 F (36.8 C)  SpO2: 94% 94%    Physical Exam:  General:  Alert, cooperative, no distress, appears stated age  Head:  Normocephalic, without obvious abnormality, atraumatic  Eyes:  , EOM's intact,   Lungs:   No visible respiratory distress  Heart:  Regular rate and rhythm, S1, S2 normal  Abdomen:   Soft, epigastric tenderness to palpation, bowel sounds present, no peritoneal signs  Extremities: Extremities normal, atraumatic, no  edema  Pulses: 2+ and symmetric    Lab Results: Recent Labs    12/15/21 0303 12/16/21 0255 12/17/21 0741  NA 137 137 137  K 3.6 3.9 4.1  CL 102 104 102  CO2 28 27 28   GLUCOSE 102* 112* 102*  BUN 8 6* 8  CREATININE 0.80 0.81 0.72  CALCIUM 7.8* 7.9* 8.7*  MG 1.6*  --  1.6*  PHOS 2.6  --  3.6   Recent Labs    12/16/21 0255 12/17/21 0741  AST 22 20  ALT 15 13  ALKPHOS 53 55  BILITOT 0.3 0.2*  PROT 4.4* 4.8*  ALBUMIN 1.8* 2.0*   Recent Labs    12/16/21 0255 12/17/21 0741  WBC 6.2 6.8  HGB 8.7* 8.9*  HCT 26.3* 28.4*  MCV 96.7 97.9  PLT 276 288   No results for input(s): LABPROT, INR in the last 72 hours.    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  No significant alcohol use.  Normal triglycerides.  No gallstones.  Normal IgG4. -History of peripheral arterial disease s/p aortofemoral bypass and bilateral common femoral endarterectomies on December 03, 2021.   Recommendations ------------------------ -Continue supportive care for now -Advance diet to  soft/low-fat -Continue Creon -No further inpatient GI work-up planned.  GI will sign off.  Follow-up in GI clinic in 2 months after discharge   Otis Brace MD, [place] 12/17/2021, 11:54 AM  Contact #  [phone]

## 2021-12-17 NOTE — Evaluation (Signed)
Physical Therapy Evaluation Patient Details Name: Elizabeth [name] MRN: [id number] DOB: [birth year] Today's Date: 12/17/2021  History of Present Illness  81 y/o female who presented to ED on 12/11/21 with sudden onset of severe abdominal pain in the setting of acute pancreatitis. Pt with recent hospitalization 1/31-2/7 for aorto bifemoral bypass with aortic endarterectomy and bilateral common femoral endarterectomies. PMH: COPD, HTN, GERD, PVD  Clinical Impression  Pt admitted with above diagnosis.  Pt was evaluated by PT last week and discharged.  Received reconsult and evaluated today.  At baseline, pt is independent, active , and working.  Today, pt ambulating with supervision level.  Did hold onto IV pole for comfort not balance.  Pt did have some DOE upon return to bed and fatigue but VSS.  Pt reports has had pancreatitis before and just takes a few weeks to get over.  Pt is weaker and and fatigues easily but does not require skilled PT services.  Should improve with time and normal activity.  Recommend ambulation with nursing staff and intermittent assist as need at d/c. Pt has necessary DME if needed for comfort at home.     Recommendations for follow up therapy are one component of a multi-disciplinary discharge planning process, led by the attending physician.  Recommendations may be updated based on patient status, additional functional criteria and insurance authorization.  Follow Up Recommendations No PT follow up    Assistance Recommended at Discharge PRN  Patient can return home with the following  Assistance with cooking/housework    Equipment Recommendations None recommended by PT  Recommendations for Other Services       Functional Status Assessment Patient has had a recent decline in their functional status and demonstrates the ability to make significant improvements in function in a reasonable and predictable amount of time. (very mild deficit)     Precautions / Restrictions  Precautions Precautions: None      Mobility  Bed Mobility Overal bed mobility: Modified Independent       Supine to sit: Modified independent (Device/Increase time) Sit to supine: Modified independent (Device/Increase time)   General bed mobility comments: Use of rail    Transfers Overall transfer level: Needs assistance Equipment used: None Transfers: Sit to/from Stand Sit to Stand: Supervision           General transfer comment: No issues standing from bed or low toilet.  Pt also performed toileting ADLs independently    Ambulation/Gait Ambulation/Gait assistance: Supervision Gait Distance (Feet): 200 Feet Assistive device: IV Pole, None Gait Pattern/deviations: WFL(Within Functional Limits) Gait velocity: mild decrease     General Gait Details: Pt walking in room without AD ; used IV pole for support in hallway for comfort not balance  Stairs            Wheelchair Mobility    Modified Rankin (Stroke Patients Only)       Balance Overall balance assessment: Needs assistance Sitting-balance support: No upper extremity supported Sitting balance-Leahy Scale: Good     Standing balance support: No upper extremity supported Standing balance-Leahy Scale: Good Standing balance comment: ADLs, grooming, donning robe in standing                             Pertinent Vitals/Pain Pain Assessment Pain Assessment: 0-10 Pain Score: 2  Pain Location: stomach Pain Descriptors / Indicators: Sore, Discomfort Pain Intervention(s): Limited activity within patient's tolerance, Monitored during session    Home Living Family/patient  expects to be discharged to:: Private residence Living Arrangements: Alone Available Help at Discharge: Available PRN/intermittently;Family Type of Home: House Home Access: Level entry       Home Layout: One level Home Equipment: Conservation officer, nature (2 wheels);Cane - single point;Shower seat;Grab bars - toilet;Grab bars -  tub/shower;Hand held shower head;Wheelchair - Water quality scientist (4 wheels) Additional Comments: reports updating home to being handicapped accessible    Prior Function Prior Level of Function : Independent/Modified Independent;Working/employed;Driving             Mobility Comments: no use of AD even after previous admission/DC home ADLs Comments: Works 5 days/week (sitting at computer, 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 Independent with all ADLs/IADLs     Hand Dominance   Dominant Hand: Right    Extremity/Trunk Assessment   Upper Extremity Assessment Upper Extremity Assessment: Overall WFL for tasks assessed (ROM WFL; MMT 5/5)    Lower Extremity Assessment Lower Extremity Assessment: Overall WFL for tasks assessed (ROM WFL; MMT 5/5)    Cervical / Trunk Assessment Cervical / Trunk Assessment: Normal  Communication   Communication: No difficulties  Cognition Arousal/Alertness: Awake/alert Behavior During Therapy: WFL for tasks assessed/performed Overall Cognitive Status: Within Functional Limits for tasks assessed                                          General Comments General comments (skin integrity, edema, etc.): Pt on RA with sats 91%.  DOE when returned to supine of 3/4, resolved with rest and repositioning.  Pt reports has had pancreatitis before and just takes a few weeks to get over.  Acknowledeged that she is weaker and gets tired easier but discussed no physical therapy skilled needs.  Recommend walking with nursing staff and gradual increase in activity at home.  Discussed it is important to move throughout the dayeven short distances to decrease other complication.  Pt agrees and She agrees with no physical therapy needs. Pt has DME and intermittent support at homd    Exercises     Assessment/Plan    PT Assessment Patient does not need any further PT services  PT Problem List         PT Treatment Interventions       PT Goals (Current goals can be found in the Care Plan section)  Acute Rehab PT Goals Patient Stated Goal: to get better PT Goal Formulation: All assessment and education complete, DC therapy    Frequency       Co-evaluation               AM-PAC PT "6 Clicks" Mobility  Outcome Measure Help needed turning from your back to your side while in a flat bed without using bedrails?: None Help needed moving from lying on your back to sitting on the side of a flat bed without using bedrails?: None Help needed moving to and from a bed to a chair (including a wheelchair)?: A Little Help needed standing up from a chair using your arms (e.g., wheelchair or bedside chair)?: A Little Help needed to walk in hospital room?: A Little Help needed climbing 3-5 steps with a railing? : A Little 6 Click Score: 20    End of Session   Activity Tolerance: Patient tolerated treatment well Patient left: in bed;with call bell/phone within reach;with bed alarm set Nurse Communication: Mobility status PT Visit Diagnosis: Muscle weakness (generalized) (  M62.81)    Time: 0677-0340 PT Time Calculation (min) (ACUTE ONLY): 22 min   Charges:   PT Evaluation $PT Eval Low Complexity: 1 Low          Josalynn Johndrow, PT Acute Rehab Services Pager [phone] [facility] [phone]   Karlton Lemon 12/17/2021, 12:19 PM

## 2021-12-18 LAB — COMPREHENSIVE METABOLIC PANEL
ALT: 12 U/L (ref 0–44)
AST: 20 U/L (ref 15–41)
Albumin: 1.9 g/dL — ABNORMAL LOW (ref 3.5–5.0)
Alkaline Phosphatase: 51 U/L (ref 38–126)
Anion gap: 8 (ref 5–15)
BUN: 8 mg/dL (ref 8–23)
CO2: 25 mmol/L (ref 22–32)
Calcium: 8.4 mg/dL — ABNORMAL LOW (ref 8.9–10.3)
Chloride: 105 mmol/L (ref 98–111)
Creatinine, Ser: 0.75 mg/dL (ref 0.44–1.00)
GFR, Estimated: >60 mL/min (ref >60)
Glucose, Bld: 104 mg/dL — ABNORMAL HIGH (ref 70–99)
Potassium: 3.8 mmol/L (ref 3.5–5.1)
Sodium: 138 mmol/L (ref 135–145)
Total Bilirubin: 0.4 mg/dL (ref 0.3–1.2)
Total Protein: 4.6 g/dL — ABNORMAL LOW (ref 6.5–8.1)

## 2021-12-18 LAB — CBC
HCT: 27 % — ABNORMAL LOW (ref 36.0–46.0)
Hemoglobin: 8.7 g/dL — ABNORMAL LOW (ref 12.0–15.0)
MCH: 30.6 pg (ref 26.0–34.0)
MCHC: 32.2 g/dL (ref 30.0–36.0)
MCV: 95.1 fL (ref 80.0–100.0)
Platelets: 308 10*3/uL (ref 150–400)
RBC: 2.84 MIL/uL — ABNORMAL LOW (ref 3.87–5.11)
RDW: 13.6 % (ref 11.5–15.5)
WBC: 6.7 10*3/uL (ref 4.0–10.5)
nRBC: 0 % (ref 0.0–0.2)

## 2021-12-18 MED ORDER — OXYCODONE HCL 5 MG PO TABS: 5.0000 mg | ORAL_TABLET | Freq: Three times a day (TID) | ORAL | 0 refills | Status: AC | PRN

## 2021-12-18 MED ORDER — PANTOPRAZOLE SODIUM 40 MG PO TBEC: 40.0000 mg | DELAYED_RELEASE_TABLET | Freq: Every day | ORAL | 0 refills | Status: DC

## 2021-12-18 MED ORDER — PANCRELIPASE (LIP-PROT-AMYL) 36000-114000 UNITS PO CPEP: 36000.0000 [IU] | ORAL_CAPSULE | Freq: Three times a day (TID) | ORAL | 0 refills | Status: AC

## 2021-12-18 MED ORDER — OXYCODONE HCL 5 MG PO TABS
5.0000 mg | ORAL_TABLET | Freq: Four times a day (QID) | ORAL | Status: DC | PRN
  Administered 2021-12-18 (×2): 5 mg via ORAL
  Filled 2021-12-18 (×2): qty 1

## 2021-12-18 MED ORDER — ENSURE ENLIVE PO LIQD: 237.0000 mL | Freq: Three times a day (TID) | ORAL | 12 refills | Status: DC

## 2021-12-18 NOTE — TOC Progression Note (Signed)
Transition of Care [facility]) - Progression Note    Patient Details  Name: Elizabeth [name] MRN: [id number] Date of Birth: 1940-12-22  Transition of Care [facility] CM/SW [place] Phone Number: 12/18/2021, 3:16 PM    [name] Appeal Detailed Notice of Discharge letter created and saved: Yes (Notice of Discharge submitted by Arnette Schaumann CM) Detailed Notice of Discharge Document Given to [employer]: Yes (Notice of Discharge Notice given to by Riley Lam RN CM) [employer] Document Created: Yes [employer] appeal documents uploaded to [employer] stite: Yes [employer] [facility] submitted by [name] CMA)

## 2021-12-18 NOTE — Discharge Summary (Incomplete Revision)
Elizabeth [name] PPI:[id number] DOB: 1941-09-18 DOA: 12/11/2021  PCP: [name], MD  Admit date: 12/11/2021 Discharge date: 12/18/2021  Admitted From: home Disposition:  home  Recommendations for Outpatient Follow-up:  Follow up with PCP in 1 week Please obtain BMP/CBC in one week Please follow up GI in 2 months.  Home Health:yes   Discharge Condition:Stable CODE STATUS:full  Diet recommendation: low fat  Brief/Interim Summary: Per HPI: 81 year old female with past medical history of COPD, hypertension, gastroesophageal reflux disease, irritable bowel syndrome, pancreatic divisium with previous episodes of acute pancreatitis presented to [facility] emergency department with complaints of left-sided abdominal pain.Of note, patient was recently hospitalized at [facility] from 1/31 until 2/7 undergoing aorto bifemoral bypass with aortic endarterectomy and bilateral common femoral endarterectomies by Dr. Unk Lightning on 1/31.  Patient slowly improved postoperatively without complication and was eventually discharged on 2/7 home.   Patient now complains of new onset abdominal pain.  Patient describes this pain as severe in intensity, lasting approximately 1 day, radiating from the left sided and epigastric regions to the back.  Pain is worse with movement and improved with rest.  Pain is associated with several episodes of nonbilious nonbloody vomiting.  Patient denies fevers, sick contacts, recent travel, contact with confirmed COVID-19 infection or recent ingestion of undercooked food.  The patient's progressively worsening symptoms she eventually presented to [facility] Emergency Department for evaluation.Patient was also found to have a markedly elevated lipase of 7346 concerning for acute pancreatitis.Patient was also found with left retroperitoneal fluid collection on CT. Patient was treated for her acute pancreatitis. GI was consulted. Also vascular surgery consulted. She  also had PT consulted and recommend HH. Once her pain controlled and was able to tolerate po intake, she is stable for discharge today.  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   Lipase on admit elevated, now nml level Social drinker..  Normal triglycerides.  No gallstones.  Normal IgG4. GI consulted Treated with supportive care, pain controlled Added Creon because of history of recurrent pancreatitis Started on soft low fat diet, which she tolerated.     Retroperitoneal fluid collection- (present on admission) - Incidental identification of a 4.7 x 3.0 cm left retroperitoneal fluid collection.  No evidence of contrast in this fluid collection making this unlikely to be an active bleed.   -Vascular surgery input appreciated, most likely due to small area of hematoma which is not surprising post Aortobifemoral bypass. Incision site looks excellent per vascular surgery     PVD (peripheral vascular disease) ([place])- (present on admission) -Complicated history of peripheral vascular disease status post recent aortobifemoral bypass with aortic endarterectomy and bilateral common femoral endarterectomies by Dr. Unk Lightning on 1/31 F/u with vascular as scheduled.   COPD (chronic obstructive pulmonary disease) ([place])- (present on admission) - No evidence of COPD exacerbation this time Continue home inhalers     Essential hypertension- (present on admission) Bp meds held initially due to hypotension Now improved.    Gastro-esophageal reflux disease without esophagitis- (present on admission) Continue home meds      Elevated troponin level not due myocardial infarction- (present on admission) Minimally  elevated Due to demand ischemia Trended down     Mixed hyperlipidemia- (present on admission) Continue home med  Hypomagnesemia -repleted   Hyponatremia Was given ivf   Protein calorie malnutrition -Continue with Ensure  Discharge Diagnoses:  Principal Problem:   Acute pancreatitis Active Problems:   COPD (chronic obstructive pulmonary disease) (HCC)   Essential hypertension   Gastro-esophageal reflux disease without esophagitis   Mixed hyperlipidemia   PVD (peripheral vascular disease) (HCC)   Retroperitoneal fluid collection   Elevated troponin level not due myocardial infarction    Discharge Instructions  Discharge Instructions     Diet - low sodium heart healthy   Complete by: As directed    Discharge instructions   Complete by: As directed    Low fat diet Hydrate F/u with pcp and GI F/u with vascular as planned   Increase activity slowly   Complete by: As directed    No wound care   Complete by: As directed       Allergies as of 12/18/2021       Reactions   Other Other (See Comments)   Chlorexolone - unknown reaction   Pollen Extract    Allergic to tree pollens   Tegretol [carbamazepine] Swelling   Lisinopril Rash   Tetracyclines & Related Rash        Medication List     STOP taking these medications    amLODipine 5 MG tablet Commonly known as: NORVASC   cefUROXime 500 MG tablet Commonly known as: CEFTIN   famotidine 20 MG tablet Commonly known as: PEPCID   furosemide 20 MG tablet Commonly known as: LASIX   irbesartan-hydrochlorothiazide 150-12.5 MG tablet Commonly known as: AVALIDE   meloxicam 15 MG tablet Commonly known as: MOBIC       TAKE these medications    aspirin EC 81 MG tablet Take 81 mg by mouth at bedtime. Swallow whole.   b complex vitamins capsule Take 1 capsule by mouth daily.   cloNIDine 0.1 MG tablet Commonly known as: CATAPRES Take 0.2 mg by mouth at bedtime.   Coenzyme Q10 100 MG  capsule Take 100 mg by mouth every morning.   feeding supplement Liqd Take 237 mLs by mouth 3 (three) times daily between meals.   gabapentin 100 MG capsule Commonly known as: NEURONTIN Take 2 capsules (200 mg total) by mouth at bedtime. PATIENT NEEDS OFFICE VISIT FOR ADDITIONAL REFILLS What changed:  how much to take when to take this additional instructions   ICY HOT EX Apply 1 application topically daily as needed (pain).   Incruse Ellipta 62.5 MCG/ACT Aepb Generic drug: umeclidinium bromide Inhale 1 puff into the lungs daily.   lipase/protease/amylase 36000 UNITS Cpep capsule Commonly known as: CREON Take 1 capsule (36,000 Units total) by mouth 3 (three) times daily before meals.   metoprolol succinate 25 MG 24 hr tablet Commonly known as: TOPROL-XL Take 25 mg by mouth at bedtime.   multivitamin with minerals Tabs tablet Take 1 tablet by mouth 2 (two) times daily.   oxyCODONE 5 MG immediate release tablet Commonly known as: Roxicodone Take 1 tablet (5 mg total) by mouth every 8 (eight) hours as needed for up to 2 days.   pantoprazole 40 MG tablet Commonly known as: PROTONIX Take 1 tablet (40 mg total) by mouth daily.   simvastatin 40 MG tablet Commonly known as: ZOCOR Take 40 mg by mouth at bedtime.   TURMERIC CURCUMIN PO Take 1 capsule by mouth daily. With Ginger   VITAMIN D PO Take 1 capsule by mouth daily.   VITAMIN E PO Take 1 capsule by mouth daily.  Follow-up Information     Gastroenterology, [facility]. Schedule an appointment as soon as possible for a visit in 2 month(s).   Why: Follow-up for recurrent pancreatitis Contact information: [place] [address] [phone]         [name], MD Follow up in 1 week(s).   Specialty: Internal Medicine Contact information: [address] [place] [address] [phone]         [facility] Follow up.    Specialty: Rehabilitation Why: Someone will call you toschedule first home visit. Contact information: [place]. [place] [address] [phone]               Allergies  Allergen Reactions   Other Other (See Comments)    Chlorexolone - unknown reaction   Pollen Extract     Allergic to tree pollens   Tegretol [Carbamazepine] Swelling   Lisinopril Rash   Tetracyclines & Related Rash    Consultations: Nephrology, vascular surgery   Procedures/Studies: DG Chest Port 1 View  Result Date: 12/04/2021 CLINICAL DATA:  Chest and abdominal pain this morning after aortobifemoral bypass grafting. EXAM: PORTABLE CHEST 1 VIEW COMPARISON:  Radiographs 12/03/2021 and 04/04/2021. FINDINGS: 0507 hours. The heart size and mediastinal contours are stable. Enteric tube projects to the level of the proximal stomach, stable. There is stable vascular congestion and mild bibasilar atelectasis. No edema, confluent airspace opacity, pneumothorax or significant pleural effusion. The bones appear unremarkable. IMPRESSION: Stable postoperative chest.  No acute findings demonstrated. Electronically Signed   By: Richardean Sale M.D.   On: 12/04/2021 08:06   DG Chest Port 1 View  Result Date: 12/03/2021 CLINICAL DATA:  Status post aortobifemoral bypass graft surgery. EXAM: PORTABLE CHEST 1 VIEW COMPARISON:  04/04/2021 FINDINGS: The heart size is normal. Stable atherosclerotic calcifications at the level of the thoracic aortic arch. Lungs demonstrate probable component of mild chronic lung disease. Normal lung volumes without evidence of edema, consolidation, pleural fluid or pneumothorax. Gastric decompression tube visualized which extends into the stomach. IMPRESSION: No acute findings.  Gastric decompression tube extends into stomach. Electronically Signed   By: Aletta Edouard M.D.   On: 12/03/2021 12:23   DG Abd Portable 1V  Result Date: 12/03/2021 CLINICAL DATA:  Status  post aortobifemoral bypass grafting. EXAM: PORTABLE ABDOMEN - 1 VIEW COMPARISON:  CT AP 09/27/2021. FINDINGS: Tip of NG tube is identified within the left upper quadrant of the abdomen. A suture chain is identified within the left hemipelvis. There are 2 surgical clips noted in the projection of the left hemi sacrum. Signs of previous ORIF of the proximal left femur. The bowel gas pattern is normal. No radio-opaque calculi or other significant radiographic abnormality are seen. IMPRESSION: 1. Nonobstructive bowel gas pattern. 2. NG tube identified with tip in the projection of the left upper quadrant of the abdomen. Electronically Signed   By: Kerby Moors M.D.   On: 12/03/2021 12:27   CT Angio Chest/Abd/Pel for Dissection W and/or W/WO  Result Date: 12/11/2021 CLINICAL DATA:  AAA, postop EXAM: CT ANGIOGRAPHY CHEST, ABDOMEN AND PELVIS TECHNIQUE: Non-contrast CT of the chest was initially obtained. Multidetector CT imaging through the chest, abdomen and pelvis was performed using the standard protocol during bolus administration of intravenous contrast. Multiplanar reconstructed images and MIPs were obtained and reviewed to evaluate the vascular anatomy. RADIATION DOSE REDUCTION: This exam was performed according to the departmental dose-optimization program which includes automated exposure control, adjustment  of the mA and/or kV according to patient size and/or use of iterative reconstruction technique. CONTRAST:  146mL OMNIPAQUE IOHEXOL 350 MG/ML SOLN COMPARISON:  09/27/2021 FINDINGS: CTA CHEST FINDINGS Cardiovascular: Diffuse aortic atherosclerosis. No aneurysm or dissection. Scattered coronary artery calcifications. Heart is normal size. Mediastinum/Nodes: No mediastinal, hilar, or axillary adenopathy. Trachea and esophagus are unremarkable. Thyroid unremarkable. Lungs/Pleura: Centrilobular emphysema. No confluent opacities or effusions. Musculoskeletal: Chest wall soft tissues are unremarkable. No acute  bony abnormality. Review of the MIP images confirms the above findings. CTA ABDOMEN AND PELVIS FINDINGS VASCULAR Aorta: Aortic atherosclerosis. No aneurysm or dissection. Interval aortobifemoral bypass. 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 Both renal arteries patent. IMA: Not visualized. Inflow: Bilateral aortobifemoral bypass grafts widely patent. Veins: No obvious venous abnormality within the limitations of this arterial phase study. Review of the MIP images confirms the above findings. NON-VASCULAR Hepatobiliary: No focal hepatic abnormality. Gallbladder unremarkable. Pancreas: Pancreatic duct mildly prominent at 4 mm. No visible pancreatic mass. Spleen: No focal abnormality.  Normal size. Adrenals/Urinary Tract: Right renal cortical thinning. Small bilateral renal cysts. No hydronephrosis. Adrenal glands and urinary bladder unremarkable. Stomach/Bowel: Postoperative changes in the sigmoid colon. Moderate stool throughout the colon. Scattered colonic diverticulosis. No active diverticulitis. Lymphatic: No adenopathy Reproductive: Uterus and adnexa unremarkable.  No mass. Other: No free fluid or free air. Within the left retroperitoneum/left anterior pararenal space, there is a predominantly low density structure measuring 4.7 x 3.0 cm, new since prior study. Some mixed density centrally. This may be a postoperative fluid collection, possibly postoperative hematoma. No active extravasation to suggest active bleed. Musculoskeletal: No acute bony abnormality. Review of the MIP images confirms the above findings. IMPRESSION: Since prior study, interval area bi femoral bypass. Bypass widely patent. No evidence of aortic aneurysm or dissection. Aortic atherosclerosis. Scattered coronary artery calcifications. No acute cardiopulmonary disease. Within the left retroperitoneum/anterior pararenal space, there is a mixed density, predominantly  low-density rounded structure measuring 4.7 x 3.0 cm. This is just superior to the proximal aortobifem anastomosis. Question postoperative fluid collection/hematoma. No contrast extravasation to suggest active bleed. Moderate stool burden in the colon. Electronically Signed   By: Rolm Baptise M.D.   On: 12/11/2021 23:17      Subjective: No abd pain this am. No n/v. Trying to eat.   Discharge Exam: Vitals:   12/17/21 1956 12/18/21 0922  BP: (!) 150/60 (!) 134/57  Pulse: 78 73  Resp: 20 18  Temp: 98.9 F (37.2 C) 97.9 F (36.6 C)  SpO2: 96% 91%   Vitals:   12/17/21 0918 12/17/21 1801 12/17/21 1956 12/18/21 0922  BP: (!) 118/54 (!) 151/68 (!) 150/60 (!) 134/57  Pulse: 70 82 78 73  Resp: 17 18 20 18   Temp: 98.3 F (36.8 C) 98.7 F (37.1 C) 98.9 F (37.2 C) 97.9 F (36.6 C)  TempSrc: Oral Oral Oral   SpO2: 94% 94% 96% 91%  Weight:      Height:        General: Pt is alert, awake, not in acute distress Cardiovascular: RRR, S1/S2 +, no rubs, no gallops Respiratory: CTA bilaterally, no wheezing, no rhonchi Abdominal: Soft, NT, ND, bowel sounds + Extremities: no edema, no cyanosis    The results of significant diagnostics from this hospitalization (including imaging, microbiology, ancillary and laboratory) are listed below for reference.     Microbiology: Recent Results (from the past 240 hour(s))  Culture, blood (routine x 2)  Status: None   Collection Time: 12/12/21 12:45 AM   Specimen: BLOOD  Result Value Ref Range Status   Specimen Description BLOOD RIGHT ARM  Final   Special Requests   Final    BOTTLES DRAWN AEROBIC AND ANAEROBIC Blood Culture results may not be optimal due to an inadequate volume of blood received in culture bottles   Culture   Final    NO GROWTH 5 DAYS Performed at [facility], [place] [address] [place], [place] [address]    Report Status 12/17/2021 FINAL  Final  Culture, blood (routine x 2)     Status: None   Collection Time: 12/12/21  12:50 AM   Specimen: BLOOD  Result Value Ref Range Status   Specimen Description BLOOD LEFT ARM  Final   Special Requests   Final    BOTTLES DRAWN AEROBIC AND ANAEROBIC Blood Culture results may not be optimal due to an inadequate volume of blood received in culture bottles   Culture   Final    NO GROWTH 5 DAYS Performed at [facility], [place] [address], [place], [place] [address]    Report Status 12/17/2021 FINAL  Final  Resp Panel by RT-PCR (Flu A&B, Covid) Nasopharyngeal Swab     Status: None   Collection Time: 12/12/21 12:53 PM   Specimen: Nasopharyngeal Swab; Nasopharyngeal(NP) swabs in vial transport medium  Result Value Ref Range Status   SARS Coronavirus 2 by RT PCR NEGATIVE NEGATIVE Final    Comment: (NOTE) SARS-CoV-2 target nucleic acids are NOT DETECTED.  The SARS-CoV-2 RNA is generally detectable in upper respiratory specimens during the acute phase of infection. The lowest concentration of SARS-CoV-2 viral copies this assay can detect is 138 copies/mL. A negative result does not preclude SARS-Cov-2 infection and should not be used as the sole basis for treatment or other patient management decisions. A negative result may occur with  improper specimen collection/handling, submission of specimen other than nasopharyngeal swab, presence of viral mutation(s) within the areas targeted by this assay, and inadequate number of viral copies(<138 copies/mL). A negative result must be combined with clinical observations, patient history, and epidemiological information. The expected result is Negative.  Fact Sheet for Patients:  EntrepreneurPulse.com.au  Fact Sheet for Healthcare Providers:  IncredibleEmployment.be  This test is no t yet approved or cleared by the [place] FDA and  has been authorized for detection and/or diagnosis of SARS-CoV-2 by FDA under an Emergency Use Authorization (EUA). This EUA will remain  in effect  (meaning this test can be used) for the duration of the COVID-19 declaration under Section 564(b)(1) of the Act, 21 U.S.C.section 360bbb-3(b)(1), unless the authorization is terminated  or revoked sooner.       Influenza A by PCR NEGATIVE NEGATIVE Final   Influenza B by PCR NEGATIVE NEGATIVE Final    Comment: (NOTE) The Xpert Xpress SARS-CoV-2/FLU/RSV plus assay is intended as an aid in the diagnosis of influenza from Nasopharyngeal swab specimens and should not be used as a sole basis for treatment. Nasal washings and aspirates are unacceptable for Xpert Xpress SARS-CoV-2/FLU/RSV testing.  Fact Sheet for Patients: EntrepreneurPulse.com.au  Fact Sheet for Healthcare Providers: IncredibleEmployment.be  This test is not yet approved or cleared by the [place] FDA and has been authorized for detection and/or diagnosis of SARS-CoV-2 by FDA under an Emergency Use Authorization (EUA). This EUA will remain in effect (meaning this test can be used) for the duration of the COVID-19 declaration under Section 564(b)(1) of  the Act, 21 U.S.C. section 360bbb-3(b)(1), unless the authorization is terminated or revoked.  Performed at [facility], [place] [address], [place], [place] [address]      Labs: BNP (last 3 results) No results for input(s): BNP in the last 8760 hours. Basic Metabolic Panel: Recent Labs  Lab 12/12/21 1649 12/13/21 0314 12/14/21 0120 12/15/21 0303 12/16/21 0255 12/17/21 0741 12/18/21 0440  NA 135   < > 134* 137 137 137 138  K 4.2   < > 3.9 3.6 3.9 4.1 3.8  CL 100   < > 101 102 104 102 105  CO2 27   < > 23 28 27 28 25   GLUCOSE 115*   < > 96 102* 112* 102* 104*  BUN 9   < > 11 8 6* 8 8  CREATININE 0.89   < > 0.87 0.80 0.81 0.72 0.75  CALCIUM 8.4*   < > 7.8* 7.8* 7.9* 8.7* 8.4*  MG 1.4*  --  1.9 1.6*  --  1.6*  --   PHOS  --   --  2.8 2.6  --  3.6  --    < > = values in this interval not displayed.   Liver  Function Tests: Recent Labs  Lab 12/14/21 0120 12/15/21 0303 12/16/21 0255 12/17/21 0741 12/18/21 0440  AST 18 23 22 20 20   ALT 12 15 15 13 12   ALKPHOS 59 55 53 55 51  BILITOT 0.8 0.4 0.3 0.2* 0.4  PROT 4.5* 4.4* 4.4* 4.8* 4.6*  ALBUMIN 1.9* 1.8* 1.8* 2.0* 1.9*   Recent Labs  Lab 12/11/21 2144 12/12/21 1649 12/13/21 0314 12/16/21 0255  LIPASE 7,346* 422* 122* 31   No results for input(s): AMMONIA in the last 168 hours. CBC: Recent Labs  Lab 12/11/21 2144 12/11/21 2200 12/12/21 1649 12/13/21 0314 12/14/21 0120 12/15/21 0303 12/16/21 0255 12/17/21 0741 12/18/21 0440  WBC 12.0*  --  12.6*   < > 8.1 7.7 6.2 6.8 6.7  NEUTROABS 9.5*  --  9.8*  --   --   --   --   --   --   HGB 11.6*   < > 10.8*   < > 8.7* 8.5* 8.7* 8.9* 8.7*  HCT 34.9*   < > 32.7*   < > 27.2* 26.2* 26.3* 28.4* 27.0*  MCV 94.8  --  96.5   < > 97.5 95.6 96.7 97.9 95.1  PLT 300  --  266   < > 253 263 276 288 308   < > = values in this interval not displayed.   Cardiac Enzymes: No results for input(s): CKTOTAL, CKMB, CKMBINDEX, TROPONINI in the last 168 hours. BNP: Invalid input(s): POCBNP CBG: No results for input(s): GLUCAP in the last 168 hours. D-Dimer No results for input(s): DDIMER in the last 72 hours. Hgb A1c No results for input(s): HGBA1C in the last 72 hours. Lipid Profile No results for input(s): CHOL, HDL, LDLCALC, TRIG, CHOLHDL, LDLDIRECT in the last 72 hours. Thyroid function studies No results for input(s): TSH, T4TOTAL, T3FREE, THYROIDAB in the last 72 hours.  Invalid input(s): FREET3 Anemia work up No results for input(s): VITAMINB12, FOLATE, FERRITIN, TIBC, IRON, RETICCTPCT in the last 72 hours. Urinalysis    Component Value Date/Time   COLORURINE YELLOW 12/12/2021 1011   APPEARANCEUR CLEAR 12/12/2021 1011   LABSPEC 1.010 12/12/2021 1011   PHURINE 6.0 12/12/2021 1011   GLUCOSEU NEGATIVE 12/12/2021 [place] 12/12/2021 [place] 12/12/2021  [place] 12/12/2021 [place] 12/12/2021 1011   NITRITE POSITIVE (A) 12/12/2021 1011   LEUKOCYTESUR NEGATIVE 12/12/2021 1011   Sepsis Labs Invalid input(s): PROCALCITONIN,  WBC,  LACTICIDVEN Microbiology Recent Results (from the past 240 hour(s))  Culture, blood (routine x 2)     Status: None   Collection Time: 12/12/21 12:45 AM   Specimen: BLOOD  Result Value Ref Range Status   Specimen Description BLOOD RIGHT ARM  Final   Special Requests   Final    BOTTLES DRAWN AEROBIC AND ANAEROBIC Blood Culture results may not be optimal due to an inadequate volume of blood received in culture bottles   Culture   Final    NO GROWTH 5 DAYS Performed at [facility], [place] [address], [place], [place] [address]    Report Status 12/17/2021 FINAL  Final  Culture, blood (routine x 2)     Status: None   Collection Time: 12/12/21 12:50 AM   Specimen: BLOOD  Result Value Ref Range Status   Specimen Description BLOOD LEFT ARM  Final   Special Requests   Final    BOTTLES DRAWN AEROBIC AND ANAEROBIC Blood Culture results may not be optimal due to an inadequate volume of blood received in culture bottles   Culture   Final    NO GROWTH 5 DAYS Performed at [facility], [place] [address] [place], [place] [address]    Report Status 12/17/2021 FINAL  Final  Resp Panel by RT-PCR (Flu A&B, Covid) Nasopharyngeal Swab     Status: None   Collection Time: 12/12/21 12:53 PM   Specimen: Nasopharyngeal Swab; Nasopharyngeal(NP) swabs in vial transport medium  Result Value Ref Range Status   SARS Coronavirus 2 by RT PCR NEGATIVE NEGATIVE Final    Comment: (NOTE) SARS-CoV-2 target nucleic acids are NOT DETECTED.  The SARS-CoV-2 RNA is generally detectable in upper respiratory specimens during the acute phase of infection. The lowest concentration of SARS-CoV-2 viral copies this assay can detect is 138 copies/mL. A negative result does not preclude SARS-Cov-2 infection  and should not be used as the sole basis for treatment or other patient management decisions. A negative result may occur with  improper specimen collection/handling, submission of specimen other than nasopharyngeal swab, presence of viral mutation(s) within the areas targeted by this assay, and inadequate number of viral copies(<138 copies/mL). A negative result must be combined with clinical observations, patient history, and epidemiological information. The expected result is Negative.  Fact Sheet for Patients:  EntrepreneurPulse.com.au  Fact Sheet for Healthcare Providers:  IncredibleEmployment.be  This test is no t yet approved or cleared by the [place] FDA and  has been authorized for detection and/or diagnosis of SARS-CoV-2 by FDA under an Emergency Use Authorization (EUA). This EUA will remain  in effect (meaning this test can be used) for the duration of the COVID-19 declaration under Section 564(b)(1) of the Act, 21 U.S.C.section 360bbb-3(b)(1), unless the authorization is terminated  or revoked sooner.       Influenza A by PCR NEGATIVE NEGATIVE Final   Influenza B by PCR NEGATIVE NEGATIVE Final    Comment: (NOTE) The Xpert Xpress SARS-CoV-2/FLU/RSV plus assay is intended as an aid in the diagnosis of influenza from Nasopharyngeal swab specimens and should not be used as a sole basis for treatment. Nasal washings and aspirates are unacceptable for Xpert Xpress SARS-CoV-2/FLU/RSV testing.  Fact Sheet for Patients: EntrepreneurPulse.com.au  Fact Sheet for Healthcare Providers: IncredibleEmployment.be  This test is not yet approved or cleared by the [place] and has been authorized for detection and/or diagnosis of SARS-CoV-2 by FDA under an Emergency Use Authorization (EUA). This EUA will remain in effect (meaning this test can be used) for the duration of the COVID-19 declaration  under Section 564(b)(1) of the Act, 21 U.S.C. section 360bbb-3(b)(1), unless the authorization is terminated or revoked.  Performed at [facility], [place] [address] [place], [place] [address]      Time coordinating discharge: Over 30 minutes  SIGNED:   Nolberto Hanlon, MD  [facility] 12/18/2021, 4:40 PM Pager   If 7PM-7AM, please contact [email or web address] [email or web address] Password [email or web address]

## 2021-12-18 NOTE — Discharge Summary (Addendum)
Elizabeth [name] WVP:[id number] DOB: 1941/06/23 DOA: 12/11/2021  PCP: [name], MD  Admit date: 12/11/2021 Discharge date: 12/19/2021  Admitted From: home Disposition:  home  Recommendations for Outpatient Follow-up:  Follow up with PCP in 1 week Please obtain BMP/CBC in one week Please follow up GI in 2 months.  Home Health:yes   Discharge Condition:Stable CODE STATUS:full  Diet recommendation: low fat  Brief/Interim Summary: Per HPI: 81 year old female with past medical history of COPD, hypertension, gastroesophageal reflux disease, irritable bowel syndrome, pancreatic divisium with previous episodes of acute pancreatitis presented to [facility] emergency department with complaints of left-sided abdominal pain.Of note, patient was recently hospitalized at [facility] from 1/31 until 2/7 undergoing aorto bifemoral bypass with aortic endarterectomy and bilateral common femoral endarterectomies by Dr. Unk Lightning on 1/31.  Patient slowly improved postoperatively without complication and was eventually discharged on 2/7 home.   Patient now complains of new onset abdominal pain.  Patient describes this pain as severe in intensity, lasting approximately 1 day, radiating from the left sided and epigastric regions to the back.  Pain is worse with movement and improved with rest.  Pain is associated with several episodes of nonbilious nonbloody vomiting.  Patient denies fevers, sick contacts, recent travel, contact with confirmed COVID-19 infection or recent ingestion of undercooked food.  The patient's progressively worsening symptoms she eventually presented to [facility] for evaluation.Patient was also found to have a markedly elevated lipase of 7346 concerning for acute pancreatitis.Patient was also found with left retroperitoneal fluid collection on CT. Patient was treated for her acute pancreatitis. GI was consulted. Also vascular surgery consulted. She  also had PT consulted and recommend HH. Once her pain controlled and was able to tolerate po intake, she is stable for discharge today.  Addendum: initially pt had appeal discharge. Today feels much better and is ready to go home. 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   Lipase on admit elevated, now nml level Social drinker..  Normal triglycerides.  No gallstones.  Normal IgG4. GI consulted Treated with supportive care, pain controlled Added Creon because of history of recurrent pancreatitis Started on soft low fat diet, which she tolerated.     Retroperitoneal fluid collection- (present on admission) - Incidental identification of a 4.7 x 3.0 cm left retroperitoneal fluid collection.  No evidence of contrast in this fluid collection making this unlikely to be an active bleed.   -Vascular surgery input appreciated, most likely due to small area of hematoma which is not surprising post Aortobifemoral bypass. Incision site looks excellent per vascular surgery     PVD (peripheral vascular disease) ([place])- (present on admission) -Complicated history of peripheral vascular disease status post recent aortobifemoral bypass with aortic endarterectomy and bilateral common femoral endarterectomies by Dr. Unk Lightning on 1/31 F/u with vascular as scheduled.   COPD (chronic obstructive pulmonary disease) ([place])- (present on admission) - No evidence of COPD exacerbation this time Continue home inhalers     Essential hypertension- (present on admission) Bp meds held initially due to hypotension Now improved.    Gastro-esophageal reflux  disease without esophagitis- (present on admission) Continue home meds  Elevated troponin level not due myocardial infarction- (present on admission) Minimally elevated Due to demand ischemia Trended down     Mixed hyperlipidemia- (present on admission) Continue home med   Hypomagnesemia -repleted   Hyponatremia Was given ivf   Protein calorie malnutrition -Continue with Ensure  Discharge Diagnoses:  Principal Problem:   Acute pancreatitis Active Problems:   COPD (chronic obstructive pulmonary disease) (HCC)   Essential hypertension   Gastro-esophageal reflux disease without esophagitis   Mixed hyperlipidemia   PVD (peripheral vascular disease) (HCC)   Retroperitoneal fluid collection   Elevated troponin level not due myocardial infarction    Discharge Instructions  Discharge Instructions     Diet - low sodium heart healthy   Complete by: As directed    Discharge instructions   Complete by: As directed    Low fat diet Hydrate F/u with pcp and GI F/u with vascular as planned   Increase activity slowly   Complete by: As directed    No wound care   Complete by: As directed       Allergies as of 12/18/2021       Reactions   Other Other (See Comments)   Chlorexolone - unknown reaction   Pollen Extract    Allergic to tree pollens   Tegretol [carbamazepine] Swelling   Lisinopril Rash   Tetracyclines & Related Rash        Medication List     STOP taking these medications    amLODipine 5 MG tablet Commonly known as: NORVASC   cefUROXime 500 MG tablet Commonly known as: CEFTIN   famotidine 20 MG tablet Commonly known as: PEPCID   furosemide 20 MG tablet Commonly known as: LASIX   irbesartan-hydrochlorothiazide 150-12.5 MG tablet Commonly known as: AVALIDE   meloxicam 15 MG tablet Commonly known as: MOBIC       TAKE these medications    aspirin EC 81 MG tablet Take 81 mg by mouth at bedtime. Swallow whole.   b complex  vitamins capsule Take 1 capsule by mouth daily.   cloNIDine 0.1 MG tablet Commonly known as: CATAPRES Take 0.2 mg by mouth at bedtime.   Coenzyme Q10 100 MG capsule Take 100 mg by mouth every morning.   feeding supplement Liqd Take 237 mLs by mouth 3 (three) times daily between meals.   gabapentin 100 MG capsule Commonly known as: NEURONTIN Take 2 capsules (200 mg total) by mouth at bedtime. PATIENT NEEDS OFFICE VISIT FOR ADDITIONAL REFILLS What changed:  how much to take when to take this additional instructions   ICY HOT EX Apply 1 application topically daily as needed (pain).   Incruse Ellipta 62.5 MCG/ACT Aepb Generic drug: umeclidinium bromide Inhale 1 puff into the lungs daily.   lipase/protease/amylase 36000 UNITS Cpep capsule Commonly known as: CREON Take 1 capsule (36,000 Units total) by mouth 3 (three) times daily before meals.   metoprolol succinate 25 MG 24 hr tablet Commonly known as: TOPROL-XL Take 25 mg by mouth at bedtime.   multivitamin with minerals Tabs tablet Take 1 tablet by mouth 2 (two) times daily.   oxyCODONE 5 MG immediate release tablet Commonly known as: Roxicodone Take 1 tablet (5 mg total) by mouth every 8 (eight) hours as needed for up to 2 days.   pantoprazole 40 MG tablet Commonly known as: PROTONIX Take 1 tablet (40 mg total) by mouth daily.   simvastatin 40 MG tablet Commonly known as: ZOCOR Take 40 mg by mouth at bedtime.   TURMERIC  CURCUMIN PO Take 1 capsule by mouth daily. With Ginger   VITAMIN D PO Take 1 capsule by mouth daily.   VITAMIN E PO Take 1 capsule by mouth daily.         Follow-up Information     Gastroenterology, [facility]. Schedule an appointment as soon as possible for a visit in 2 month(s).   Why: Follow-up for recurrent pancreatitis Contact information: [place] [address] [phone]         [name], MD Follow up in 1 week(s).   Specialty: Internal  Medicine Contact information: [address] [place] [address] [phone]         [facility] Follow up.   Specialty: Rehabilitation Why: Someone will call you toschedule first home visit. Contact information: [place] [address] [phone]               Allergies  Allergen Reactions   Other Other (See Comments)    Chlorexolone - unknown reaction   Pollen Extract     Allergic to tree pollens   Tegretol [Carbamazepine] Swelling   Lisinopril Rash   Tetracyclines & Related Rash    Consultations: Nephrology, vascular surgery   Procedures/Studies: DG Chest Port 1 View  Result Date: 12/04/2021 CLINICAL DATA:  Chest and abdominal pain this morning after aortobifemoral bypass grafting. EXAM: PORTABLE CHEST 1 VIEW COMPARISON:  Radiographs 12/03/2021 and 04/04/2021. FINDINGS: 0507 hours. The heart size and mediastinal contours are stable. Enteric tube projects to the level of the proximal stomach, stable. There is stable vascular congestion and mild bibasilar atelectasis. No edema, confluent airspace opacity, pneumothorax or significant pleural effusion. The bones appear unremarkable. IMPRESSION: Stable postoperative chest.  No acute findings demonstrated. Electronically Signed   By: Richardean Sale M.D.   On: 12/04/2021 08:06   DG Chest Port 1 View  Result Date: 12/03/2021 CLINICAL DATA:  Status post aortobifemoral bypass graft surgery. EXAM: PORTABLE CHEST 1 VIEW COMPARISON:  04/04/2021 FINDINGS: The heart size is normal. Stable atherosclerotic calcifications at the level of the thoracic aortic arch. Lungs demonstrate probable component of mild chronic lung disease. Normal lung volumes without evidence of edema, consolidation, pleural fluid or pneumothorax. Gastric decompression tube visualized which extends into the stomach. IMPRESSION: No acute findings.  Gastric decompression tube  extends into stomach. Electronically Signed   By: Aletta Edouard M.D.   On: 12/03/2021 12:23   DG Abd Portable 1V  Result Date: 12/03/2021 CLINICAL DATA:  Status post aortobifemoral bypass grafting. EXAM: PORTABLE ABDOMEN - 1 VIEW COMPARISON:  CT AP 09/27/2021. FINDINGS: Tip of NG tube is identified within the left upper quadrant of the abdomen. A suture chain is identified within the left hemipelvis. There are 2 surgical clips noted in the projection of the left hemi sacrum. Signs of previous ORIF of the proximal left femur. The bowel gas pattern is normal. No radio-opaque calculi or other significant radiographic abnormality are seen. IMPRESSION: 1. Nonobstructive bowel gas pattern. 2. NG tube identified with tip in the projection of the left upper quadrant of the abdomen. Electronically Signed   By: Kerby Moors M.D.   On: 12/03/2021 12:27   CT Angio Chest/Abd/Pel for Dissection W and/or W/WO  Result Date: 12/11/2021 CLINICAL DATA:  AAA, postop EXAM: CT ANGIOGRAPHY CHEST, ABDOMEN AND PELVIS TECHNIQUE: Non-contrast CT of the chest was initially obtained. Multidetector CT imaging through the chest, abdomen and pelvis was performed using the standard  protocol during bolus administration of intravenous contrast. Multiplanar reconstructed images and MIPs were obtained and reviewed to evaluate the vascular anatomy. RADIATION DOSE REDUCTION: This exam was performed according to the departmental dose-optimization program which includes automated exposure control, adjustment of the mA and/or kV according to patient size and/or use of iterative reconstruction technique. CONTRAST:  131mL OMNIPAQUE IOHEXOL 350 MG/ML SOLN COMPARISON:  09/27/2021 FINDINGS: CTA CHEST FINDINGS Cardiovascular: Diffuse aortic atherosclerosis. No aneurysm or dissection. Scattered coronary artery calcifications. Heart is normal size. Mediastinum/Nodes: No mediastinal, hilar, or axillary adenopathy. Trachea and esophagus are unremarkable.  Thyroid unremarkable. Lungs/Pleura: Centrilobular emphysema. No confluent opacities or effusions. Musculoskeletal: Chest wall soft tissues are unremarkable. No acute bony abnormality. Review of the MIP images confirms the above findings. CTA ABDOMEN AND PELVIS FINDINGS VASCULAR Aorta: Aortic atherosclerosis. No aneurysm or dissection. Interval aortobifemoral bypass. 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 Both renal arteries patent. IMA: Not visualized. Inflow: Bilateral aortobifemoral bypass grafts widely patent. Veins: No obvious venous abnormality within the limitations of this arterial phase study. Review of the MIP images confirms the above findings. NON-VASCULAR Hepatobiliary: No focal hepatic abnormality. Gallbladder unremarkable. Pancreas: Pancreatic duct mildly prominent at 4 mm. No visible pancreatic mass. Spleen: No focal abnormality.  Normal size. Adrenals/Urinary Tract: Right renal cortical thinning. Small bilateral renal cysts. No hydronephrosis. Adrenal glands and urinary bladder unremarkable. Stomach/Bowel: Postoperative changes in the sigmoid colon. Moderate stool throughout the colon. Scattered colonic diverticulosis. No active diverticulitis. Lymphatic: No adenopathy Reproductive: Uterus and adnexa unremarkable.  No mass. Other: No free fluid or free air. Within the left retroperitoneum/left anterior pararenal space, there is a predominantly low density structure measuring 4.7 x 3.0 cm, new since prior study. Some mixed density centrally. This may be a postoperative fluid collection, possibly postoperative hematoma. No active extravasation to suggest active bleed. Musculoskeletal: No acute bony abnormality. Review of the MIP images confirms the above findings. IMPRESSION: Since prior study, interval area bi femoral bypass. Bypass widely patent. No evidence of aortic aneurysm or dissection. Aortic atherosclerosis. Scattered  coronary artery calcifications. No acute cardiopulmonary disease. Within the left retroperitoneum/anterior pararenal space, there is a mixed density, predominantly low-density rounded structure measuring 4.7 x 3.0 cm. This is just superior to the proximal aortobifem anastomosis. Question postoperative fluid collection/hematoma. No contrast extravasation to suggest active bleed. Moderate stool burden in the colon. Electronically Signed   By: Rolm Baptise M.D.   On: 12/11/2021 23:17      Subjective: No abd pain this am. No n/v. Trying to eat.   Discharge Exam: Vitals:   12/17/21 1956 12/18/21 0922  BP: (!) 150/60 (!) 134/57  Pulse: 78 73  Resp: 20 18  Temp: 98.9 F (37.2 C) 97.9 F (36.6 C)  SpO2: 96% 91%   Vitals:   12/17/21 0918 12/17/21 1801 12/17/21 1956 12/18/21 0922  BP: (!) 118/54 (!) 151/68 (!) 150/60 (!) 134/57  Pulse: 70 82 78 73  Resp: 17 18 20 18   Temp: 98.3 F (36.8 C) 98.7 F (37.1 C) 98.9 F (37.2 C) 97.9 F (36.6 C)  TempSrc: Oral Oral Oral   SpO2: 94% 94% 96% 91%  Weight:      Height:        General: Pt is alert, awake, not in acute distress Cardiovascular: RRR, S1/S2 +, no rubs, no gallops Respiratory: CTA bilaterally, no wheezing, no rhonchi Abdominal: Soft, NT, ND, bowel sounds + Extremities: no edema, no cyanosis  The results of significant diagnostics from this hospitalization (including imaging, microbiology, ancillary and laboratory) are listed below for reference.     Microbiology: Recent Results (from the past 240 hour(s))  Culture, blood (routine x 2)     Status: None   Collection Time: 12/12/21 12:45 AM   Specimen: BLOOD  Result Value Ref Range Status   Specimen Description BLOOD RIGHT ARM  Final   Special Requests   Final    BOTTLES DRAWN AEROBIC AND ANAEROBIC Blood Culture results may not be optimal due to an inadequate volume of blood received in culture bottles   Culture   Final    NO GROWTH 5 DAYS Performed at [facility], [place] [address] [place], [place] [address]    Report Status 12/17/2021 FINAL  Final  Culture, blood (routine x 2)     Status: None   Collection Time: 12/12/21 12:50 AM   Specimen: BLOOD  Result Value Ref Range Status   Specimen Description BLOOD LEFT ARM  Final   Special Requests   Final    BOTTLES DRAWN AEROBIC AND ANAEROBIC Blood Culture results may not be optimal due to an inadequate volume of blood received in culture bottles   Culture   Final    NO GROWTH 5 DAYS Performed at [facility], [place] [address] [place], [place] [address]    Report Status 12/17/2021 FINAL  Final  Resp Panel by RT-PCR (Flu A&B, Covid) Nasopharyngeal Swab     Status: None   Collection Time: 12/12/21 12:53 PM   Specimen: Nasopharyngeal Swab; Nasopharyngeal(NP) swabs in vial transport medium  Result Value Ref Range Status   SARS Coronavirus 2 by RT PCR NEGATIVE NEGATIVE Final    Comment: (NOTE) SARS-CoV-2 target nucleic acids are NOT DETECTED.  The SARS-CoV-2 RNA is generally detectable in upper respiratory specimens during the acute phase of infection. The lowest concentration of SARS-CoV-2 viral copies this assay can detect is 138 copies/mL. A negative result does not preclude SARS-Cov-2 infection and should not be used as the sole basis for treatment or other patient management decisions. A negative result may occur with  improper specimen collection/handling, submission of specimen other than nasopharyngeal swab, presence of viral mutation(s) within the areas targeted by this assay, and inadequate number of viral copies(<138 copies/mL). A negative result must be combined with clinical observations, patient history, and epidemiological information. The expected result is Negative.  Fact Sheet for Patients:  EntrepreneurPulse.com.au  Fact Sheet for Healthcare Providers:  IncredibleEmployment.be  This test is no t yet approved or cleared by the  [place] FDA and  has been authorized for detection and/or diagnosis of SARS-CoV-2 by FDA under an Emergency Use Authorization (EUA). This EUA will remain  in effect (meaning this test can be used) for the duration of the COVID-19 declaration under Section 564(b)(1) of the Act, 21 U.S.C.section 360bbb-3(b)(1), unless the authorization is terminated  or revoked sooner.       Influenza A by PCR NEGATIVE NEGATIVE Final   Influenza B by PCR NEGATIVE NEGATIVE Final    Comment: (NOTE) The Xpert Xpress SARS-CoV-2/FLU/RSV plus assay is intended as an aid in the diagnosis of influenza from Nasopharyngeal swab specimens and should not be used as a sole basis for treatment. Nasal washings and aspirates are unacceptable for Xpert Xpress SARS-CoV-2/FLU/RSV testing.  Fact Sheet for Patients: EntrepreneurPulse.com.au  Fact Sheet for Healthcare Providers: IncredibleEmployment.be  This test is not yet approved or cleared by the [place] FDA  and has been authorized for detection and/or diagnosis of SARS-CoV-2 by FDA under an Emergency Use Authorization (EUA). This EUA will remain in effect (meaning this test can be used) for the duration of the COVID-19 declaration under Section 564(b)(1) of the Act, 21 U.S.C. section 360bbb-3(b)(1), unless the authorization is terminated or revoked.  Performed at [facility], [place] [address] [place], [place] [address]      Labs: BNP (last 3 results) No results for input(s): BNP in the last 8760 hours. Basic Metabolic Panel: Recent Labs  Lab 12/12/21 1649 12/13/21 0314 12/14/21 0120 12/15/21 0303 12/16/21 0255 12/17/21 0741 12/18/21 0440  NA 135   < > 134* 137 137 137 138  K 4.2   < > 3.9 3.6 3.9 4.1 3.8  CL 100   < > 101 102 104 102 105  CO2 27   < > 23 28 27 28 25   GLUCOSE 115*   < > 96 102* 112* 102* 104*  BUN 9   < > 11 8 6* 8 8  CREATININE 0.89   < > 0.87 0.80 0.81 0.72 0.75  CALCIUM  8.4*   < > 7.8* 7.8* 7.9* 8.7* 8.4*  MG 1.4*  --  1.9 1.6*  --  1.6*  --   PHOS  --   --  2.8 2.6  --  3.6  --    < > = values in this interval not displayed.   Liver Function Tests: Recent Labs  Lab 12/14/21 0120 12/15/21 0303 12/16/21 0255 12/17/21 0741 12/18/21 0440  AST 18 23 22 20 20   ALT 12 15 15 13 12   ALKPHOS 59 55 53 55 51  BILITOT 0.8 0.4 0.3 0.2* 0.4  PROT 4.5* 4.4* 4.4* 4.8* 4.6*  ALBUMIN 1.9* 1.8* 1.8* 2.0* 1.9*   Recent Labs  Lab 12/11/21 2144 12/12/21 1649 12/13/21 0314 12/16/21 0255  LIPASE 7,346* 422* 122* 31   No results for input(s): AMMONIA in the last 168 hours. CBC: Recent Labs  Lab 12/11/21 2144 12/11/21 2200 12/12/21 1649 12/13/21 0314 12/14/21 0120 12/15/21 0303 12/16/21 0255 12/17/21 0741 12/18/21 0440  WBC 12.0*  --  12.6*   < > 8.1 7.7 6.2 6.8 6.7  NEUTROABS 9.5*  --  9.8*  --   --   --   --   --   --   HGB 11.6*   < > 10.8*   < > 8.7* 8.5* 8.7* 8.9* 8.7*  HCT 34.9*   < > 32.7*   < > 27.2* 26.2* 26.3* 28.4* 27.0*  MCV 94.8  --  96.5   < > 97.5 95.6 96.7 97.9 95.1  PLT 300  --  266   < > 253 263 276 288 308   < > = values in this interval not displayed.   Cardiac Enzymes: No results for input(s): CKTOTAL, CKMB, CKMBINDEX, TROPONINI in the last 168 hours. BNP: Invalid input(s): POCBNP CBG: No results for input(s): GLUCAP in the last 168 hours. D-Dimer No results for input(s): DDIMER in the last 72 hours. Hgb A1c No results for input(s): HGBA1C in the last 72 hours. Lipid Profile No results for input(s): CHOL, HDL, LDLCALC, TRIG, CHOLHDL, LDLDIRECT in the last 72 hours. Thyroid function studies No results for input(s): TSH, T4TOTAL, T3FREE, THYROIDAB in the last 72 hours.  Invalid input(s): FREET3 Anemia work up No results for input(s): VITAMINB12, FOLATE, FERRITIN, TIBC, IRON, RETICCTPCT in the last 72 hours. Urinalysis    Component Value Date/Time  COLORURINE YELLOW 12/12/2021 [place] 12/12/2021 1011    LABSPEC 1.010 12/12/2021 1011   PHURINE 6.0 12/12/2021 1011   GLUCOSEU NEGATIVE 12/12/2021 1011   HGBUR NEGATIVE 12/12/2021 [place] 12/12/2021 [place] 12/12/2021 1011   PROTEINUR NEGATIVE 12/12/2021 1011   NITRITE POSITIVE (A) 12/12/2021 1011   LEUKOCYTESUR NEGATIVE 12/12/2021 1011   Sepsis Labs Invalid input(s): PROCALCITONIN,  WBC,  LACTICIDVEN Microbiology Recent Results (from the past 240 hour(s))  Culture, blood (routine x 2)     Status: None   Collection Time: 12/12/21 12:45 AM   Specimen: BLOOD  Result Value Ref Range Status   Specimen Description BLOOD RIGHT ARM  Final   Special Requests   Final    BOTTLES DRAWN AEROBIC AND ANAEROBIC Blood Culture results may not be optimal due to an inadequate volume of blood received in culture bottles   Culture   Final    NO GROWTH 5 DAYS Performed at [facility], [place] [address], [place], [place] [address]    Report Status 12/17/2021 FINAL  Final  Culture, blood (routine x 2)     Status: None   Collection Time: 12/12/21 12:50 AM   Specimen: BLOOD  Result Value Ref Range Status   Specimen Description BLOOD LEFT ARM  Final   Special Requests   Final    BOTTLES DRAWN AEROBIC AND ANAEROBIC Blood Culture results may not be optimal due to an inadequate volume of blood received in culture bottles   Culture   Final    NO GROWTH 5 DAYS Performed at [facility], [place] [address] [place],  [address]    Report Status 12/17/2021 FINAL  Final  Resp Panel by RT-PCR (Flu A&B, Covid) Nasopharyngeal Swab     Status: None   Collection Time: 12/12/21 12:53 PM   Specimen: Nasopharyngeal Swab; Nasopharyngeal(NP) swabs in vial transport medium  Result Value Ref Range Status   SARS Coronavirus 2 by RT PCR NEGATIVE NEGATIVE Final    Comment: (NOTE) SARS-CoV-2 target nucleic acids are NOT DETECTED.  The SARS-CoV-2 RNA is generally detectable in upper respiratory specimens during the acute  phase of infection. The lowest concentration of SARS-CoV-2 viral copies this assay can detect is 138 copies/mL. A negative result does not preclude SARS-Cov-2 infection and should not be used as the sole basis for treatment or other patient management decisions. A negative result may occur with  improper specimen collection/handling, submission of specimen other than nasopharyngeal swab, presence of viral mutation(s) within the areas targeted by this assay, and inadequate number of viral copies(<138 copies/mL). A negative result must be combined with clinical observations, patient history, and epidemiological information. The expected result is Negative.  Fact Sheet for Patients:  EntrepreneurPulse.com.au  Fact Sheet for Healthcare Providers:  IncredibleEmployment.be  This test is no t yet approved or cleared by the [place] FDA and  has been authorized for detection and/or diagnosis of SARS-CoV-2 by FDA under an Emergency Use Authorization (EUA). This EUA will remain  in effect (meaning this test can be used) for the duration of the COVID-19 declaration under Section 564(b)(1) of the Act, 21 U.S.C.section 360bbb-3(b)(1), unless the authorization is terminated  or revoked sooner.       Influenza A by PCR NEGATIVE NEGATIVE Final   Influenza B by PCR NEGATIVE NEGATIVE Final    Comment: (NOTE) The Xpert Xpress SARS-CoV-2/FLU/RSV plus assay is intended as an aid in the diagnosis of influenza  from Nasopharyngeal swab specimens and should not be used as a sole basis for treatment. Nasal washings and aspirates are unacceptable for Xpert Xpress SARS-CoV-2/FLU/RSV testing.  Fact Sheet for Patients: EntrepreneurPulse.com.au  Fact Sheet for Healthcare Providers: IncredibleEmployment.be  This test is not yet approved or cleared by the [place] FDA and has been authorized for detection and/or diagnosis of  SARS-CoV-2 by FDA under an Emergency Use Authorization (EUA). This EUA will remain in effect (meaning this test can be used) for the duration of the COVID-19 declaration under Section 564(b)(1) of the Act, 21 U.S.C. section 360bbb-3(b)(1), unless the authorization is terminated or revoked.  Performed at [facility], [place] [address] [place], [place] [address]      Time coordinating discharge: Over 30 minutes  SIGNED:   Nolberto Hanlon, MD  [facility] 12/18/2021, 4:40 PM Pager   If 7PM-7AM, please contact night-coverage [email or web address] Password [email or web address]

## 2021-12-18 NOTE — Progress Notes (Signed)
Im Letter given 2/13 by [name]

## 2021-12-18 NOTE — TOC Progression Note (Addendum)
Transition of Care [facility]) - Progression Note    Patient Details  Name: CATHLIN BUCHAN MRN: [id number] Date of Birth: 1940/11/13  Transition of Care [facility]) CM/SW Contact  Tom-Johnson, Renea Ee, RN Phone Number: 12/18/2021, 3:44 PM  Clinical Narrative:     Patient is medically ready and scheduled for discharge today per MD. Discharge orders in and patient appealing discharge. CM advised patient to call number on IM form given to her this admission to start the appeal process and explained the appeal process to her, daughter in-law and granddaughter. Notice of discharge and [employer] appeal form read to patient and patient signed electronically. Copy given to patient at bedside. All necessary documents uploaded and sent. Case ID # : [id number]. Awaiting response.  Patient active with [facility] and CM called and spoke with Amy to resume services at discharge with acceptance voiced. CM will continue to follow with needs.        Expected Discharge Plan and Services           Expected Discharge Date: 12/18/21                                     Social Determinants of Health (SDOH) Interventions    Readmission Risk Interventions Readmission Risk Prevention Plan 12/10/2021  Post Dischage Appt Complete  Medication Screening Complete  Transportation Screening Complete  Some recent data might be hidden

## 2021-12-19 NOTE — TOC Transition Note (Signed)
Transition of Care [facility]) - CM/SW Discharge Note   Patient Details  Name: Elizabeth [name] MRN: [id number] Date of Birth: 1941/07/17  Transition [facility] Care [facility]) CM/SW Contact:  Tom-Johnson, Renea Ee, RN Phone Number: 12/19/2021, 11:18 AM   Clinical Narrative:     Patient is scheduled for discharge today. Withdraw discharge appeal. CM notified patient she will have to call [employer] and notify them of withdrawing appeal. Home health PT/OT/RN/Aide with [facility] and info on AVS. Family to transport at discharge. No further TOC needs noted.  Final next level of care: [place] Barriers to Discharge: Barriers Resolved   Patient Goals and CMS Choice Patient states their goals for this hospitalization and ongoing recovery are:: To return home CMS Medicare.gov Compare Post Acute Care list provided to:: Patient Choice offered to / list presented to : Patient  Discharge Placement                Patient to be transferred to facility by: Family      Discharge Plan and Services                DME Arranged: N/A DME Agency: NA       HH Arranged: PT, OT, RN, Nurse's Aide [place] Agency: [place] Date [facility] Agency Contacted: 12/18/21 Time [place]: 1244 Representative spoke with at [place]: Amy  Social Determinants of Health ([place]) Interventions     Readmission Risk Interventions Readmission Risk Prevention Plan 12/10/2021  Post Dischage Appt Complete  Medication Screening Complete  Transportation Screening Complete  Some recent data might be hidden

## 2021-12-19 NOTE — Progress Notes (Signed)
Patient seen and examined Overall, appears to be improving Still poor diet, continues to have baseline abdominal pain. Incisions healing appropriately No concerns from vascular surgery perspective  Patient has scheduled follow-up with me.  I will continue to see intermittently while hospitalized.  [name]

## 2021-12-21 DIAGNOSIS — I10 Essential (primary) hypertension: Secondary | ICD-10-CM | POA: Diagnosis not present

## 2021-12-21 DIAGNOSIS — Z7982 Long term (current) use of aspirin: Secondary | ICD-10-CM | POA: Diagnosis not present

## 2021-12-21 DIAGNOSIS — I739 Peripheral vascular disease, unspecified: Secondary | ICD-10-CM | POA: Diagnosis not present

## 2021-12-21 DIAGNOSIS — Z48812 Encounter for surgical aftercare following surgery on the circulatory system: Secondary | ICD-10-CM | POA: Diagnosis not present

## 2021-12-21 DIAGNOSIS — J449 Chronic obstructive pulmonary disease, unspecified: Secondary | ICD-10-CM | POA: Diagnosis not present

## 2021-12-21 DIAGNOSIS — E46 Unspecified protein-calorie malnutrition: Secondary | ICD-10-CM | POA: Diagnosis not present

## 2021-12-21 DIAGNOSIS — I2510 Atherosclerotic heart disease of native coronary artery without angina pectoris: Secondary | ICD-10-CM | POA: Diagnosis not present

## 2021-12-21 DIAGNOSIS — K589 Irritable bowel syndrome without diarrhea: Secondary | ICD-10-CM | POA: Diagnosis not present

## 2021-12-21 DIAGNOSIS — K859 Acute pancreatitis without necrosis or infection, unspecified: Secondary | ICD-10-CM | POA: Diagnosis not present

## 2021-12-24 DIAGNOSIS — Z48812 Encounter for surgical aftercare following surgery on the circulatory system: Secondary | ICD-10-CM | POA: Diagnosis not present

## 2021-12-24 DIAGNOSIS — K859 Acute pancreatitis without necrosis or infection, unspecified: Secondary | ICD-10-CM | POA: Diagnosis not present

## 2021-12-24 DIAGNOSIS — I739 Peripheral vascular disease, unspecified: Secondary | ICD-10-CM | POA: Diagnosis not present

## 2021-12-24 DIAGNOSIS — I2510 Atherosclerotic heart disease of native coronary artery without angina pectoris: Secondary | ICD-10-CM | POA: Diagnosis not present

## 2021-12-24 DIAGNOSIS — E46 Unspecified protein-calorie malnutrition: Secondary | ICD-10-CM | POA: Diagnosis not present

## 2021-12-24 DIAGNOSIS — I10 Essential (primary) hypertension: Secondary | ICD-10-CM | POA: Diagnosis not present

## 2021-12-25 DIAGNOSIS — I10 Essential (primary) hypertension: Secondary | ICD-10-CM | POA: Diagnosis not present

## 2021-12-25 DIAGNOSIS — K859 Acute pancreatitis without necrosis or infection, unspecified: Secondary | ICD-10-CM | POA: Diagnosis not present

## 2021-12-25 DIAGNOSIS — G629 Polyneuropathy, unspecified: Secondary | ICD-10-CM | POA: Diagnosis not present

## 2021-12-25 DIAGNOSIS — I739 Peripheral vascular disease, unspecified: Secondary | ICD-10-CM | POA: Diagnosis not present

## 2021-12-26 DIAGNOSIS — K859 Acute pancreatitis without necrosis or infection, unspecified: Secondary | ICD-10-CM | POA: Diagnosis not present

## 2021-12-26 DIAGNOSIS — Z48812 Encounter for surgical aftercare following surgery on the circulatory system: Secondary | ICD-10-CM | POA: Diagnosis not present

## 2021-12-26 DIAGNOSIS — I10 Essential (primary) hypertension: Secondary | ICD-10-CM | POA: Diagnosis not present

## 2021-12-26 DIAGNOSIS — I739 Peripheral vascular disease, unspecified: Secondary | ICD-10-CM | POA: Diagnosis not present

## 2021-12-26 DIAGNOSIS — I2510 Atherosclerotic heart disease of native coronary artery without angina pectoris: Secondary | ICD-10-CM | POA: Diagnosis not present

## 2021-12-26 DIAGNOSIS — E46 Unspecified protein-calorie malnutrition: Secondary | ICD-10-CM | POA: Diagnosis not present

## 2021-12-27 DIAGNOSIS — Z48812 Encounter for surgical aftercare following surgery on the circulatory system: Secondary | ICD-10-CM | POA: Diagnosis not present

## 2021-12-27 DIAGNOSIS — K859 Acute pancreatitis without necrosis or infection, unspecified: Secondary | ICD-10-CM | POA: Diagnosis not present

## 2021-12-27 DIAGNOSIS — I739 Peripheral vascular disease, unspecified: Secondary | ICD-10-CM | POA: Diagnosis not present

## 2021-12-27 DIAGNOSIS — I2510 Atherosclerotic heart disease of native coronary artery without angina pectoris: Secondary | ICD-10-CM | POA: Diagnosis not present

## 2021-12-27 DIAGNOSIS — I10 Essential (primary) hypertension: Secondary | ICD-10-CM | POA: Diagnosis not present

## 2021-12-27 DIAGNOSIS — E46 Unspecified protein-calorie malnutrition: Secondary | ICD-10-CM | POA: Diagnosis not present

## 2021-12-27 NOTE — Op Note (Signed)
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 Donzetta Matters, MD Co-surgeon: Melene Muller, MD Procedure Performed: 1.  Aortobifemoral bypass 2.  Aortic endarterectomy 3.  Lysis of adhesions greater than 30 minutes 4.  Ligation of inferior mesenteric vein 5.  Bilateral common femoral endarterectomy  Indications: 81 year old female with [place] 4 chronic limb threatening ischemia on the left and short distance claudication [place] 3 chronic limb threatening ischemia on the right.  Dr. Unk Lightning has discussed with her proceeding with aortobifemoral bypass and she has agreed to proceed.  Findings: Dictated in Dr. Unk Lightning made operative note   Procedure:  The patient was identified in the holding area and taken to the operating room and placed supine on the operating table.  Other details of the operation were previously dictated.  I assisted with exposing and performing right common femoral endarterectomy as well as assisting with exposure of lysis of adhesions the proximal aorta, the aortic endarterectomy, the ligation of the inferior mesenteric vein and sewing of the proximal anastomosis as well as sending the right common femoral anastomosis.  A co-surgeon was necessary due to the complexity of this case and to expedite the procedure safely perform the technical aspects of the case.  Edis Huish C. Donzetta Matters, MD [facility] Office: [phone] Pager: [phone]

## 2021-12-31 ENCOUNTER — Ambulatory Visit (INDEPENDENT_AMBULATORY_CARE_PROVIDER_SITE_OTHER): Payer: Medicare Other | Admitting: Gastroenterology

## 2021-12-31 VITALS — BP 122/60 | HR 83 | Ht 60.0 in | Wt 105.6 lb

## 2021-12-31 DIAGNOSIS — Q453 Other congenital malformations of pancreas and pancreatic duct: Secondary | ICD-10-CM | POA: Diagnosis not present

## 2021-12-31 DIAGNOSIS — K858 Other acute pancreatitis without necrosis or infection: Secondary | ICD-10-CM

## 2021-12-31 NOTE — Progress Notes (Addendum)
HPI : Elizabeth [name] is a pleasant 81 year old female with a history of peripheral vascular disease status post recent aortobifemoral bypass with aortic endarterectomy and bilateral common femoral endarterectomy on January 31 who presents to our office for follow-up after recent admission for pancreatitis.  The patient was admitted with pancreatitis in January 2022 and again in February 2023.  She was followed by [facility] consultants during both  her inpatient stays (Dr. Cristina Gong, Dr. Therisa Doyne, Dr. Alessandra Bevels).  During the admission in January 2022, an ultrasound was negative for gallstones but did show mild dilation of her pancreatic and common bile ducts.  A subsequent MRCP showed a persistent mild pancreatic duct and findings consistent with pancreas divisum.  Her pancreatitis was felt to be most likely secondary to pancreas divisum and she was not recommended to have cholecystectomy.  She rarely drinks alcohol and her triglycerides were normal.  She does continue to smoke cigarettes. 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  These episodes would typically last a few days and then resolved.  She was never able to identify any sort of triggers for these episodes.  She kept a food diary and was not able to identify any triggers.  The episode of pancreatitis in January 2022 was her first episode of pancreatitis.  She continued to have these less severe episodes of abdominal pain and nausea and vomiting over the next year, but no significant pain until this past admission a few weeks ago. Both episodes of pancreatitis were traumatizing for the patient, characterized by severe epigastric abdominal pain radiating into the back with nausea and vomiting as well as anorexia.  She was hospitalized about a week each time.  For the episode in 2022, she had mild acute respiratory failure requiring diuresis and  supplemental oxygen  Interestingly, both episodes of pancreatitis have come on the heels of recent surgeries.  In 2022, it was following a laminectomy.  With this most recent episode, it occurred the day after discharge for her bypass/endarterectomy surgeries.  She denies taking any narcotics or pain medicines outside of postop pain.  Following her most recent discharge for pancreatitis, she was started on Creon.  She has been taking 1 capsule with each meal.  She is down about 5 or 6 pounds from her baseline weight.  Her appetite is okay, but she remains very nervous about eating.  She has not had any significant abdominal pain since discharge.  She does continue to feel nauseated every morning. She has been having issues with constipation recently and taking MiraLAX once a day which helps but not completely.  She denies problems with diarrhea or steatorrhea.     Past Medical History:  Diagnosis Date   Abdominal cramping    possibly irritable bowel syndrome versus adhesions secondary to multiple surgeries and abdominal inflammation and 2009 secondary to diverticulitis and diverticular phlegmon   Allergic rhinitis    Arthritis    Carotid artery disease (HCC)    67-12% RICA, < 45% LICA 80/9/98 [name] ([facility])   Cataract    Chest pain syndrome 2004   with adenosine Cardiolite that was normal   Chronic hoarseness    COPD (chronic obstructive pulmonary disease) (HCC)    Dyspnea    GERD (gastroesophageal reflux disease)    Heart murmur    Mild AS, AI. AV pk grad 18.79 mmHg, mn grad 11.20 mmHg, AVA (VTI) 1.05 cm2 10/10/20,  3 year f/u rec ([facility])   History of kidney stones    History of renal calculi    Hypercholesterolemia    Hypertension    Incontinence    Cough incontinence   Mild depression    Neuromuscular disorder (HCC)    neuropathy bilateral lower extremities   Osteopenia    Peripheral vascular disease (HCC)    with femoral and carotid bruits   PONV (postoperative nausea and  vomiting)    Recurrent pancreatitis    Tobacco dependence    Vitamin D deficiency      Past Surgical History:  Procedure Laterality Date   ABDOMINAL AORTOGRAM W/LOWER EXTREMITY N/A 09/18/2021   Procedure: ABDOMINAL AORTOGRAM W/LOWER EXTREMITY;  Surgeon: [name], MD;  Location: [facility];  Service: Cardiovascular;  Laterality: N/A;   AORTA - BILATERAL FEMORAL ARTERY BYPASS GRAFT Bilateral 12/03/2021   Procedure: AORTOBIFEMORAL BYPASS GRAFT USING A 14 X 20mm HEMASHIELD GOLD GRAFT;  Surgeon: [name], MD;  Location: [place];  Service: Vascular;  Laterality: Bilateral;   APPENDECTOMY     BREAST BIOPSY     CATARACT EXTRACTION, BILATERAL     COLON SURGERY     2009   COSMETIC SURGERY     on neck and eyes   DILATION AND CURETTAGE OF UTERUS     EYE SURGERY     2014   FEMUR SURGERY  12/2007   left femur surgery--for femoral neck fracture status post fall    FRACTURE SURGERY     broke left leg and left arm- 2012   LIPOSUCTION     LUMBAR LAMINECTOMY/DECOMPRESSION MICRODISCECTOMY N/A 11/12/2020   Procedure: LAMINECTOMY AND FORAMINOTOMY Lumbar three four, Lumbar four five, Lumbar Five Sacral one;  Surgeon: Newman Pies, MD;  Location: [place];  Service: Neurosurgery;  Laterality: N/A;   OTHER SURGICAL HISTORY     sigmoid colectomy   SHOULDER SURGERY     arthroscopic shoulder surgery    TONSILLECTOMY     TUBAL LIGATION     Family History  Problem Relation Age of Onset   Heart disease Mother    Stroke Mother    Social History   Tobacco Use   Smoking status: Every Day    Packs/day: 1.00    Years: 60.00    Pack years: 60.00    Types: Cigarettes   Smokeless tobacco: Never  Vaping Use   Vaping Use: Never used  Substance Use Topics   Alcohol use: Yes    Alcohol/week: 0.0 standard drinks    Comment: Occ glass of wine   Drug use: No   Current Outpatient Medications  Medication Sig Dispense Refill   aspirin EC 81 MG tablet Take 81 mg by mouth at bedtime.  Swallow whole.     b complex vitamins capsule Take 1 capsule by mouth daily.     cloNIDine (CATAPRES) 0.1 MG tablet Take 0.2 mg by mouth at bedtime.     Coenzyme Q10 100 MG capsule Take 100 mg by mouth every morning.     feeding supplement (ENSURE ENLIVE / ENSURE PLUS) LIQD Take 237 mLs by mouth 3 (three) times daily between meals. 237 mL 12   gabapentin (NEURONTIN) 100 MG capsule Take 2 capsules (200 mg total) by mouth at bedtime. PATIENT NEEDS OFFICE VISIT FOR ADDITIONAL REFILLS (Patient taking differently: Take 100-200 mg by mouth See admin instructions. Take one capsule (100 mg) by mouth every morning and two capsules (200 mg) at night) 60 capsule 0  INCRUSE ELLIPTA 62.5 MCG/INH AEPB Inhale 1 puff into the lungs daily. 30 each 1   lipase/protease/amylase (CREON) 36000 UNITS CPEP capsule Take 1 capsule (36,000 Units total) by mouth 3 (three) times daily before meals. 90 capsule 0   Menthol, Topical Analgesic, (ICY HOT EX) Apply 1 application topically daily as needed (pain).     metoprolol succinate (TOPROL-XL) 25 MG 24 hr tablet Take 25 mg by mouth at bedtime.     Multiple Vitamin (MULTIVITAMIN WITH MINERALS) TABS tablet Take 1 tablet by mouth 2 (two) times daily.     pantoprazole (PROTONIX) 40 MG tablet Take 1 tablet (40 mg total) by mouth daily. 30 tablet 0   simvastatin (ZOCOR) 40 MG tablet Take 40 mg by mouth at bedtime.     TURMERIC CURCUMIN PO Take 1 capsule by mouth daily. With Ginger     VITAMIN D PO Take 1 capsule by mouth daily.     VITAMIN E PO Take 1 capsule by mouth daily.     No current facility-administered medications for this visit.   Allergies  Allergen Reactions   Other Other (See Comments)    Chlorexolone - unknown reaction   Pollen Extract     Allergic to tree pollens   Tegretol [Carbamazepine] Swelling   Lisinopril Rash   Tetracyclines & Related Rash     Review of Systems: All systems reviewed and negative except where noted in HPI.    DG Chest Port 1  View  Result Date: 12/04/2021 CLINICAL DATA:  Chest and abdominal pain this morning after aortobifemoral bypass grafting. EXAM: PORTABLE CHEST 1 VIEW COMPARISON:  Radiographs 12/03/2021 and 04/04/2021. FINDINGS: 0507 hours. The heart size and mediastinal contours are stable. Enteric tube projects to the level of the proximal stomach, stable. There is stable vascular congestion and mild bibasilar atelectasis. No edema, confluent airspace opacity, pneumothorax or significant pleural effusion. The bones appear unremarkable. IMPRESSION: Stable postoperative chest.  No acute findings demonstrated. Electronically Signed   By: Richardean Sale M.D.   On: 12/04/2021 08:06   DG Chest Port 1 View  Result Date: 12/03/2021 CLINICAL DATA:  Status post aortobifemoral bypass graft surgery. EXAM: PORTABLE CHEST 1 VIEW COMPARISON:  04/04/2021 FINDINGS: The heart size is normal. Stable atherosclerotic calcifications at the level of the thoracic aortic arch. Lungs demonstrate probable component of mild chronic lung disease. Normal lung volumes without evidence of edema, consolidation, pleural fluid or pneumothorax. Gastric decompression tube visualized which extends into the stomach. IMPRESSION: No acute findings.  Gastric decompression tube extends into stomach. Electronically Signed   By: Aletta Edouard M.D.   On: 12/03/2021 12:23   DG Abd Portable 1V  Result Date: 12/03/2021 CLINICAL DATA:  Status post aortobifemoral bypass grafting. EXAM: PORTABLE ABDOMEN - 1 VIEW COMPARISON:  CT AP 09/27/2021. FINDINGS: Tip of NG tube is identified within the left upper quadrant of the abdomen. A suture chain is identified within the left hemipelvis. There are 2 surgical clips noted in the projection of the left hemi sacrum. Signs of previous ORIF of the proximal left femur. The bowel gas pattern is normal. No radio-opaque calculi or other significant radiographic abnormality are seen. IMPRESSION: 1. Nonobstructive bowel gas pattern. 2.  NG tube identified with tip in the projection of the left upper quadrant of the abdomen. Electronically Signed   By: Kerby Moors M.D.   On: 12/03/2021 12:27   CT Angio Chest/Abd/Pel for Dissection W and/or W/WO  Result Date: 12/11/2021 CLINICAL DATA:  AAA, postop EXAM:  CT ANGIOGRAPHY CHEST, ABDOMEN AND PELVIS TECHNIQUE: Non-contrast CT of the chest was initially obtained. Multidetector CT imaging through the chest, abdomen and pelvis was performed using the standard protocol during bolus administration of intravenous contrast. Multiplanar reconstructed images and MIPs were obtained and reviewed to evaluate the vascular anatomy. RADIATION DOSE REDUCTION: This exam was performed according to the departmental dose-optimization program which includes automated exposure control, adjustment of the mA and/or kV according to patient size and/or use of iterative reconstruction technique. CONTRAST:  117mL OMNIPAQUE IOHEXOL 350 MG/ML SOLN COMPARISON:  09/27/2021 FINDINGS: CTA CHEST FINDINGS Cardiovascular: Diffuse aortic atherosclerosis. No aneurysm or dissection. Scattered coronary artery calcifications. Heart is normal size. Mediastinum/Nodes: No mediastinal, hilar, or axillary adenopathy. Trachea and esophagus are unremarkable. Thyroid unremarkable. Lungs/Pleura: Centrilobular emphysema. No confluent opacities or effusions. Musculoskeletal: Chest wall soft tissues are unremarkable. No acute bony abnormality. Review of the MIP images confirms the above findings. CTA ABDOMEN AND PELVIS FINDINGS VASCULAR Aorta: Aortic atherosclerosis. No aneurysm or dissection. Interval aortobifemoral bypass. 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 Both renal arteries patent. IMA: Not visualized. Inflow: Bilateral aortobifemoral bypass grafts widely patent. Veins: No obvious venous abnormality within the limitations of this arterial phase study. Review of the MIP  images confirms the above findings. NON-VASCULAR Hepatobiliary: No focal hepatic abnormality. Gallbladder unremarkable. Pancreas: Pancreatic duct mildly prominent at 4 mm. No visible pancreatic mass. Spleen: No focal abnormality.  Normal size. Adrenals/Urinary Tract: Right renal cortical thinning. Small bilateral renal cysts. No hydronephrosis. Adrenal glands and urinary bladder unremarkable. Stomach/Bowel: Postoperative changes in the sigmoid colon. Moderate stool throughout the colon. Scattered colonic diverticulosis. No active diverticulitis. Lymphatic: No adenopathy Reproductive: Uterus and adnexa unremarkable.  No mass. Other: No free fluid or free air. Within the left retroperitoneum/left anterior pararenal space, there is a predominantly low density structure measuring 4.7 x 3.0 cm, new since prior study. Some mixed density centrally. This may be a postoperative fluid collection, possibly postoperative hematoma. No active extravasation to suggest active bleed. Musculoskeletal: No acute bony abnormality. Review of the MIP images confirms the above findings. IMPRESSION: Since prior study, interval area bi femoral bypass. Bypass widely patent. No evidence of aortic aneurysm or dissection. Aortic atherosclerosis. Scattered coronary artery calcifications. No acute cardiopulmonary disease. Within the left retroperitoneum/anterior pararenal space, there is a mixed density, predominantly low-density rounded structure measuring 4.7 x 3.0 cm. This is just superior to the proximal aortobifem anastomosis. Question postoperative fluid collection/hematoma. No contrast extravasation to suggest active bleed. Moderate stool burden in the colon. Electronically Signed   By: Rolm Baptise M.D.   On: 12/11/2021 23:17    CLINICAL DATA:  Pancreatitis   EXAM: ULTRASOUND ABDOMEN LIMITED RIGHT UPPER QUADRANT   COMPARISON:  CT from same day   FINDINGS: Gallbladder:   No gallstones or wall thickening visualized. No  sonographic Murphy sign noted by sonographer.   Common bile duct:   Diameter: 7.3 cm   Liver:   No focal lesion identified. Within normal limits in parenchymal echogenicity. Portal vein is patent on color Doppler imaging with normal direction of blood flow towards the liver.   Other: There is a trace amount of free fluid in the upper abdomen. The partially visualized pancreatic duct appears to be dilated.   IMPRESSION: 1. No evidence for cholelithiasis. 2. Dilatation of the pancreatic duct and common bile duct raises concern for an obstructing process such as choledocholithiasis. Follow-up with MRCP/ERCP is recommended. 3. Trace  free fluid in the upper abdomen.     Electronically Signed   By: Constance Holster M.D.   On: 11/17/2020 06:57    CLINICAL DATA:  Pancreatitis suspected   EXAM: MRI ABDOMEN WITHOUT AND WITH CONTRAST (INCLUDING MRCP)   TECHNIQUE: Multiplanar multisequence MR imaging of the abdomen was performed both before and after the administration of intravenous contrast. Heavily T2-weighted images of the biliary and pancreatic ducts were obtained, and three-dimensional MRCP images were rendered by post processing.   CONTRAST:  68mL GADAVIST GADOBUTROL 1 MMOL/ML IV SOLN   COMPARISON:  CT abdomen pelvis, abdominal ultrasound, 11/17/2020   FINDINGS: Lower chest: Small bilateral pleural effusions and associated atelectasis or consolidation.   Hepatobiliary: No mass or other parenchymal abnormality identified. The gallbladder is distended, however there are no gallstones identified. No biliary ductal dilatation. The common bile duct measures up to 6 mm in caliber and is patent to the ampulla without calculus or other filling defect.   Pancreas: There is diffuse inflammatory fat stranding about the pancreas and adjacent retroperitoneum. There is no evidence of acute pancreatic fluid collection or parenchymal hypoenhancement to suggest necrosis. There is  pancreatic divisum. The pancreatic duct is diffusely dilated to the papillae, measuring up to 7 mm. No mass or other parenchymal abnormality identified.   Spleen:  Within normal limits in size and appearance.   Adrenals/Urinary Tract: No masses identified. No evidence of hydronephrosis.   Stomach/Bowel: Visualized portions within the abdomen are unremarkable.   Vascular/Lymphatic: No pathologically enlarged lymph nodes identified. No abdominal aortic aneurysm demonstrated.   Other: Very extensive inflammatory fat stranding throughout the visualized retroperitoneum. Small volume ascites throughout the abdomen. Anasarca.   Musculoskeletal: No suspicious bone lesions identified.   IMPRESSION: 1. Acute pancreatitis. No evidence of acute pancreatic fluid collection or parenchymal hypoenhancement to suggest necrosis. There is very extensive inflammatory fat stranding about the entirety of the included retroperitoneum. 2. There is pancreatic divisum and the pancreatic duct is diffusely dilated to the papillae, measuring up to 7 mm. No pancreatic mass or other parenchymal abnormality identified. 3. The gallbladder is distended, however there are no gallstones identified. No biliary ductal dilatation. 4. Pleural effusions, ascites, and anasarca.     Electronically Signed   By: Eddie Candle M.D.   On: 11/19/2020 07:46   Physical Exam: There were no vitals taken for this visit. Constitutional: Pleasant,well-developed, thin Caucasian female in no acute distress. HEENT: Normocephalic and atraumatic. Conjunctivae are normal. No scleral icterus. Neck supple.  Cardiovascular: Normal rate, regular rhythm, systolic murmur Pulmonary/chest: Effort normal and breath sounds normal. No wheezing, rales or rhonchi. Abdominal: Soft, nondistended, tenderness to palpation in the epigastrium, without rigidity or guarding bowel sounds active throughout. There are no masses palpable. No hepatomegaly.   Healing surgical incisions in abdomen and groin without erythema, discharge or warmth Extremities: no edema Neurological: Alert and oriented to person place and time. Skin: Skin is warm and dry. No rashes noted. Psychiatric: Normal mood and affect. Behavior is normal.  CBC    Component Value Date/Time   WBC 6.7 12/18/2021 0440   RBC 2.84 (L) 12/18/2021 0440   HGB 8.7 (L) 12/18/2021 0440   HCT 27.0 (L) 12/18/2021 0440   PLT 308 12/18/2021 0440   MCV 95.1 12/18/2021 0440   MCH 30.6 12/18/2021 0440   MCHC 32.2 12/18/2021 0440   RDW 13.6 12/18/2021 0440   LYMPHSABS 1.2 12/12/2021 1649   MONOABS 1.1 (H) 12/12/2021 1649   EOSABS 0.4 12/12/2021 1649  BASOSABS 0.1 12/12/2021 1649    CMP     Component Value Date/Time   NA 138 12/18/2021 0440   K 3.8 12/18/2021 0440   CL 105 12/18/2021 0440   CO2 25 12/18/2021 0440   GLUCOSE 104 (H) 12/18/2021 0440   BUN 8 12/18/2021 0440   CREATININE 0.75 12/18/2021 0440   CALCIUM 8.4 (L) 12/18/2021 0440   PROT 4.6 (L) 12/18/2021 0440   ALBUMIN 1.9 (L) 12/18/2021 0440   AST 20 12/18/2021 0440   ALT 12 12/18/2021 0440   ALKPHOS 51 12/18/2021 0440   BILITOT 0.4 12/18/2021 0440   GFRNONAA >60 12/18/2021 0440   GFRAA >60 04/11/2020 1053     ASSESSMENT AND PLAN: 81 year old female with chronic tobacco use/COPD, peripheral artery disease status post bypass surgery and endarterectomy, with 2 episodes of acute pancreatitis requiring hospitalization in the past year.  MRCP last year confirmed presence of pancreas divisum which is presumed to be the etiology for her pancreatitis.  She has chronic symptoms of episodic abdominal pain with nausea vomiting, which may also be related to her divisum.  Her episodes of pancreatitis have both occurred within a week of surgery.  I told her it is possible that the pain medications she has received postoperatively may have contributed to her pancreatitis via spasm or contraction of the sphincter complex.  Although 2  episodes of pancreatitis secondary to P divisum within the year would certainly warrant consideration of sphincterotomy, given the patient's age and comorbidities as well as the possibility that these episodes were provoked by narcotics, I am not sure that the risks of ERCP with sphincterotomy outweigh the benefits.  I would like to discuss her case with one of our biliary colleagues to get their opinion on the relative risks and benefits in this patient. In the meantime, I recommended the patient do all that she can to further reduce her risk of future episodes of pancreatitis.  This includes completely eliminating alcohol, and smoking cessation.  I recommended she follow a low-fat diet and continue Creon.  We will refer her to nutrition to help her create a low-fat diet that also optimizes her caloric and nutritional intake. With regards to her constipation, I recommended she just try increasing her MiraLAX to 2 or 3 times a day as needed to produce more frequent bowel movements.  Recurrent acute pancreatitis, likely secondary to pancreas divisum -We will discuss with biliary colleagues regarding risk/benefits of ERCP with sphincterotomy - Nutrition referral - Continue Creon - Stop drinking alcohol completely - Tobacco cessation -Low fat diet -Avoid narcotics/opioids  Constipation -Increase Miralax to 2-3x/day  Molleigh Huot E. Candis Schatz, MD [facility]   CC:  [name], MD   Addendum:  I spoke with Drs. Mansouraty regarding the role of sphincterotomy for this patient and neither felt that a sphicterotomy was necessary at this time.  Dr. Rush Landmark suggested an EUS to evaluate further for microlithiasis/biliary sludge and to further evaluate her ducts and then rediscuss risks/benefits of sphincterotomy following that procedure.  I spoke to the patient on the phone today and she was agreeable to proceeding with a routine upper EUS.  Patient will be contacted by our nursing staff to  schedule the procedure with Dr. Rush Landmark

## 2021-12-31 NOTE — Patient Instructions (Addendum)
If you are age 82 or older, your body mass index should be between 23-30. Your Body mass index is 20.62 kg/m. If this is out of the aforementioned range listed, please consider follow up with your Primary Care Provider.  If you are age 63 or younger, your body mass index should be between 19-25. Your Body mass index is 20.62 kg/m. If this is out of the aformentioned range listed, please consider follow up with your Primary Care Provider.   We have sent referral to nutrition and they will call you to schedule an appointment.   Follow Low [place] diet.  Avoid narcotic pain medication.  Recommend smoking and alcohol cessation.   Continue creon 1 cap per meal  Increase Miralax to 2-3 times a day  The [facility] providers would like to encourage you to use [facility] to communicate with providers for non-urgent requests or questions.  Due to long hold times on the telephone, sending your provider a message by [facility] may be a faster and more efficient way to get a response.  Please allow 48 business hours for a response.  Please remember that this is for non-urgent requests.   It was a pleasure to see you today!  Thank you for trusting me with your gastrointestinal care!    Scott E.Candis Schatz, MD

## 2022-01-01 DIAGNOSIS — G629 Polyneuropathy, unspecified: Secondary | ICD-10-CM | POA: Insufficient documentation

## 2022-01-01 DIAGNOSIS — R200 Anesthesia of skin: Secondary | ICD-10-CM | POA: Insufficient documentation

## 2022-01-01 DIAGNOSIS — M4310 Spondylolisthesis, site unspecified: Secondary | ICD-10-CM | POA: Insufficient documentation

## 2022-01-01 DIAGNOSIS — I739 Peripheral vascular disease, unspecified: Secondary | ICD-10-CM | POA: Diagnosis not present

## 2022-01-01 DIAGNOSIS — K859 Acute pancreatitis without necrosis or infection, unspecified: Secondary | ICD-10-CM | POA: Diagnosis not present

## 2022-01-01 DIAGNOSIS — M19019 Primary osteoarthritis, unspecified shoulder: Secondary | ICD-10-CM | POA: Insufficient documentation

## 2022-01-01 DIAGNOSIS — M778 Other enthesopathies, not elsewhere classified: Secondary | ICD-10-CM | POA: Insufficient documentation

## 2022-01-01 DIAGNOSIS — Z48812 Encounter for surgical aftercare following surgery on the circulatory system: Secondary | ICD-10-CM | POA: Diagnosis not present

## 2022-01-01 DIAGNOSIS — M25519 Pain in unspecified shoulder: Secondary | ICD-10-CM | POA: Insufficient documentation

## 2022-01-01 DIAGNOSIS — I2510 Atherosclerotic heart disease of native coronary artery without angina pectoris: Secondary | ICD-10-CM | POA: Diagnosis not present

## 2022-01-01 DIAGNOSIS — I10 Essential (primary) hypertension: Secondary | ICD-10-CM | POA: Diagnosis not present

## 2022-01-01 DIAGNOSIS — E46 Unspecified protein-calorie malnutrition: Secondary | ICD-10-CM | POA: Diagnosis not present

## 2022-01-02 ENCOUNTER — Telehealth: Payer: Self-pay

## 2022-01-02 ENCOUNTER — Other Ambulatory Visit: Payer: Self-pay

## 2022-01-02 DIAGNOSIS — K858 Other acute pancreatitis without necrosis or infection: Secondary | ICD-10-CM

## 2022-01-02 NOTE — Telephone Encounter (Signed)
EUS scheduled for 02/24/22 at [facility] with GM at 1130 am  ?

## 2022-01-02 NOTE — Telephone Encounter (Signed)
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 ?I spoke with the patient on the phone and she would like to proceed with an upper EUS.  She just got out of the hospital 2 weeks ago from her pancreatitis, so I'd probably give her another month or so recover and get her strength back. ? ? ?----- Message ----- ?From: Mansouraty, Elizabeth Nab., MD ?Sent: 01/01/2022   4:06 PM EST ?To: Elizabeth Banister, MD, Elizabeth November, MD ?Subject: RE: Sphincterotomy for recurrent AP from p d# ? ?[place], ?Interesting case. ?Interesting as well that she has had this all her life and the only 2 documented episodes were around the time of narcotics being administered. ?I think an endoscopic ultrasound is reasonable to ensure no other evidence of issues is occurring including microlithiasis where in which, consideration of cholecystectomy could be had more greatly with our general surgeons. ?Those individuals whom I have performed previously minor sphincterotomy are usually much younger with multiple episodes being documented (more than 2) and are not drinking alcohol or using tobacco products. ?I think her risk of minor sphincterotomy and pancreatitis is upwards of 10 to 67% though certainly could be considered as an approach to this individual, if we can get through the minor duct. ?If the patient wants an EUS we can get that done, if nothing else is found I would be happy to talk with her about the risks of ERCP and just continued monitoring as outlined by DJ. ?Let us know. ?Thanks. ?GM ?----- Message ----- ?From: Elizabeth Banister, MD ?Sent: 12/31/2021   7:35 PM EST ?To: Irving Copas., MD, # ?Subject: RE: Sphincterotomy for recurrent AP from p d# ? ?Very interesting that both episodes of AP were shortly after a surgery.  I do not recommend ERCP for her.  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 They will probably defer but this used to be quite a common referral for recurrent AP without clear other etiology.  ? ?Elizabeth [name] may have other thoughts.   ? ? 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 ?Gents,  ?Just wanted to get some biliary input on the risks/benefits of possible ERCP with sphincterotomy in this patient. ? ?81 y/o fm, chronic tobacco (active), COPD, PAD recently s/p aortofemoral bypass/endarterectomy, with 2 admissions for acute pancreatitis in the past year (Jan 2022, Feb 2023), each putting her in the hospital for a week. ?Still has GB, but no stones/sludge on [name].  MRCP c/w pancreas divisum. 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 ?Both episodes of pancreatitis occurred within a week of surgery (laminectomy in 2022, bypass in 2023) so I suspect maybe post op narcotics played a role? ? ?She was seen by [name] as an inpatient for both these admissions, but her PCM recommended she follow up with [facility] and she got booked with me. ?   ?I told her I thought ERCP/sphincterotomy would normally be reasonable with 2 episodes of AP, but that given her age, comorbidities and possible precipitating role of post op narcotics, that maybe the risk/benefit ratio weighed in favor of noninvasive management.  On the other hand, if she  keeps having pancreatitis, her next episode could be much worse. ? ?Any strong feelings either way? ? ? ? ? ? ? ? ? ? ?

## 2022-01-02 NOTE — Telephone Encounter (Signed)
EUS scheduled, pt instructed and medications reviewed.  Patient instructions mailed to home and sent to My Chart .  Patient to call with any questions or concerns.  

## 2022-01-02 NOTE — Telephone Encounter (Signed)
Mansouraty, Telford Nab., MD  Daryel November, MD ?Cc: [name], MD; Timothy Lasso, RN ?[facility],  ?Sounds reasonable.  Jamese Trauger, please schedule an EUS with me, in the off chance that we do end up considering Pancreatic ERCP in the future.  ?GM  ?

## 2022-01-03 ENCOUNTER — Other Ambulatory Visit: Payer: Self-pay

## 2022-01-03 ENCOUNTER — Ambulatory Visit (INDEPENDENT_AMBULATORY_CARE_PROVIDER_SITE_OTHER): Payer: Medicare Other | Admitting: Physician Assistant

## 2022-01-03 ENCOUNTER — Ambulatory Visit (HOSPITAL_COMMUNITY)
Admission: RE | Admit: 2022-01-03 | Discharge: 2022-01-03 | Disposition: A | Discharge status: Home / Self Care | Payer: Medicare Other | Source: Ambulatory Visit | Attending: Vascular Surgery | Admitting: Vascular Surgery

## 2022-01-03 VITALS — BP 128/66 | HR 88 | Temp 98.0°F | Resp 20 | Ht 60.0 in | Wt 101.5 lb

## 2022-01-03 DIAGNOSIS — I739 Peripheral vascular disease, unspecified: Secondary | ICD-10-CM

## 2022-01-03 DIAGNOSIS — Z1211 Encounter for screening for malignant neoplasm of colon: Secondary | ICD-10-CM | POA: Insufficient documentation

## 2022-01-03 DIAGNOSIS — R202 Paresthesia of skin: Secondary | ICD-10-CM | POA: Insufficient documentation

## 2022-01-03 DIAGNOSIS — I70222 Atherosclerosis of native arteries of extremities with rest pain, left leg: Secondary | ICD-10-CM | POA: Insufficient documentation

## 2022-01-03 DIAGNOSIS — E559 Vitamin D deficiency, unspecified: Secondary | ICD-10-CM | POA: Insufficient documentation

## 2022-01-03 DIAGNOSIS — E7800 Pure hypercholesterolemia, unspecified: Secondary | ICD-10-CM | POA: Insufficient documentation

## 2022-01-03 DIAGNOSIS — M5412 Radiculopathy, cervical region: Secondary | ICD-10-CM | POA: Insufficient documentation

## 2022-01-03 DIAGNOSIS — Z48812 Encounter for surgical aftercare following surgery on the circulatory system: Secondary | ICD-10-CM | POA: Diagnosis not present

## 2022-01-03 DIAGNOSIS — R011 Cardiac murmur, unspecified: Secondary | ICD-10-CM | POA: Insufficient documentation

## 2022-01-03 DIAGNOSIS — R634 Abnormal weight loss: Secondary | ICD-10-CM | POA: Insufficient documentation

## 2022-01-03 DIAGNOSIS — J309 Allergic rhinitis, unspecified: Secondary | ICD-10-CM | POA: Insufficient documentation

## 2022-01-03 DIAGNOSIS — I351 Nonrheumatic aortic (valve) insufficiency: Secondary | ICD-10-CM | POA: Insufficient documentation

## 2022-01-03 DIAGNOSIS — R413 Other amnesia: Secondary | ICD-10-CM | POA: Insufficient documentation

## 2022-01-03 DIAGNOSIS — I2510 Atherosclerotic heart disease of native coronary artery without angina pectoris: Secondary | ICD-10-CM | POA: Diagnosis not present

## 2022-01-03 DIAGNOSIS — E46 Unspecified protein-calorie malnutrition: Secondary | ICD-10-CM | POA: Diagnosis not present

## 2022-01-03 DIAGNOSIS — K859 Acute pancreatitis without necrosis or infection, unspecified: Secondary | ICD-10-CM | POA: Diagnosis not present

## 2022-01-03 DIAGNOSIS — Z95828 Presence of other vascular implants and grafts: Secondary | ICD-10-CM

## 2022-01-03 DIAGNOSIS — Q453 Other congenital malformations of pancreas and pancreatic duct: Secondary | ICD-10-CM | POA: Insufficient documentation

## 2022-01-03 DIAGNOSIS — J989 Respiratory disorder, unspecified: Secondary | ICD-10-CM | POA: Insufficient documentation

## 2022-01-03 DIAGNOSIS — G4710 Hypersomnia, unspecified: Secondary | ICD-10-CM | POA: Insufficient documentation

## 2022-01-03 DIAGNOSIS — R1013 Epigastric pain: Secondary | ICD-10-CM | POA: Insufficient documentation

## 2022-01-03 DIAGNOSIS — R5382 Chronic fatigue, unspecified: Secondary | ICD-10-CM | POA: Insufficient documentation

## 2022-01-03 DIAGNOSIS — M5430 Sciatica, unspecified side: Secondary | ICD-10-CM | POA: Insufficient documentation

## 2022-01-03 DIAGNOSIS — I493 Ventricular premature depolarization: Secondary | ICD-10-CM | POA: Insufficient documentation

## 2022-01-03 DIAGNOSIS — Z720 Tobacco use: Secondary | ICD-10-CM | POA: Insufficient documentation

## 2022-01-03 DIAGNOSIS — M792 Neuralgia and neuritis, unspecified: Secondary | ICD-10-CM | POA: Insufficient documentation

## 2022-01-03 DIAGNOSIS — I6523 Occlusion and stenosis of bilateral carotid arteries: Secondary | ICD-10-CM | POA: Insufficient documentation

## 2022-01-03 DIAGNOSIS — M9390 Osteochondropathy, unspecified of unspecified site: Secondary | ICD-10-CM | POA: Insufficient documentation

## 2022-01-03 DIAGNOSIS — I10 Essential (primary) hypertension: Secondary | ICD-10-CM | POA: Diagnosis not present

## 2022-01-03 NOTE — Progress Notes (Signed)
?POST OPERATIVE OFFICE NOTE ? ? ? ?CC:  F/u for surgery ? ?HPI:  This is a 81 y.o. female who is s/p aortobifemoral bypass, aortic endarterectomy; lysis of adhesions, ligation of inferior mesenteric vein and bilateral common femoral endarterectomy by Dr. Donzetta Matters and Dr. Virl Cagey on 12/03/21. She did well post operatively and was discharged POD#7.  ? ?She was readmitted on 2/8 with epigastric abdominal pain and n/v. She was diagnosed with acute pancreatitis. 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 She had multiple imaging studies during admission with showed patency of Aortobifemoral bypass. She was seen by Dr. Virl Cagey during her readmission. No concerns for vascular compromise during that time. She was discharged on 2/15. She is still under the care of Gastroenterology for management of her pancreatitis.  ? ?She reports today that from her surgery standpoint she feels she is doing well. She still has a little burning in her thighs and also she has some paresthesias in her feet. She says her ankles sometimes feel like they are being squeezed and she feels like she is walking on pebbles. She takes Gabapentin which somewhat helps. Overall she is getting around okay. She says it is hard because she feels very weak. She still has significant nausea and so her appetite has been poor. She denies any pain in her legs on ambulation or at rest. No non healing wounds. She says her bowels are working but not like normal. She feels that her incisions are healing " beautifully". ? ?Allergies  ?Allergen Reactions  ? Alendronate Sodium   ?  Other reaction(s): loose teeth  ? Azithromycin   ?  Other reaction(s): N,V,D.Marland KitchenMarland Kitchen? 1 time occurence?  ? Other Other (See Comments)  ?  Chlorexolone - unknown reaction  ? Pollen Extract   ?  Allergic to tree pollens  ? Tegretol [Carbamazepine] Swelling  ? Varenicline   ?  Other reaction(s): N/V/D and flu sxs  ? Lisinopril Rash  ? Tetracyclines & Related Rash   ? ? ?Current Outpatient Medications  ?Medication Sig Dispense Refill  ? aspirin EC 81 MG tablet Take 81 mg by mouth at bedtime. Swallow whole.    ? b complex vitamins capsule Take 1 capsule by mouth daily.    ? cloNIDine (CATAPRES) 0.1 MG tablet Take 0.2 mg by mouth at bedtime.    ? Coenzyme Q10 100 MG capsule Take 100 mg by mouth every morning.    ? feeding supplement (ENSURE ENLIVE / ENSURE PLUS) LIQD Take 237 mLs by mouth 3 (three) times daily between meals. 237 mL 12  ? gabapentin (NEURONTIN) 100 MG capsule Take 2 capsules (200 mg total) by mouth at bedtime. PATIENT NEEDS OFFICE VISIT FOR ADDITIONAL REFILLS (Patient taking differently: Take 100-200 mg by mouth See admin instructions. Take one capsule (100 mg) by mouth every morning and two capsules (200 mg) at night) 60 capsule 0  ? INCRUSE ELLIPTA 62.5 MCG/INH AEPB Inhale 1 puff into the lungs daily. 30 each 1  ? lipase/protease/amylase (CREON) 36000 UNITS CPEP capsule Take 1 capsule (36,000 Units total) by mouth 3 (three) times daily before meals. 90 capsule 0  ? Menthol, Topical Analgesic, (ICY HOT EX) Apply 1 application topically daily as needed (pain).    ? metoprolol succinate (TOPROL-XL) 25 MG 24 hr tablet Take 25 mg by mouth at bedtime.    ? Multiple Vitamin (MULTIVITAMIN WITH MINERALS) TABS tablet Take 1 tablet by mouth 2 (two) times daily.    ?  pantoprazole (PROTONIX) 40 MG tablet Take 1 tablet (40 mg total) by mouth daily. 30 tablet 0  ? simvastatin (ZOCOR) 40 MG tablet Take 40 mg by mouth at bedtime.    ? TURMERIC CURCUMIN PO Take 1 capsule by mouth daily. With Ginger    ? VITAMIN D PO Take 1 capsule by mouth daily.    ? VITAMIN E PO Take 1 capsule by mouth daily.    ? ?No current facility-administered medications for this visit.  ? ? ? ROS:  See HPI ? ?Physical Exam: ? ?Vitals:  ? 01/03/22 1109  ?BP: 128/66  ?Pulse: 88  ?Resp: 20  ?Temp: 98 ?F (36.7 ?C)  ?TempSrc: Temporal  ?SpO2: 99%  ?Weight: 101 lb 8 oz (46 kg)  ?Height: 5' (1.524 m)   ? ?General: well appearing, in no acute distress ?Lungs: non labored ?Cardiac: regular rate and rhythm ?Incision:  Laparotomy incision, B groin incisions healing very well ?Extremities:  well perfused and warm with 2+ femoral pulses, 2+ DP pulses bilaterally. Feet warm and well perfused ?Neuro: alert and oriented ?Abdomen:  flat, soft, non distended ? ?Non invasive vascular lab: ? ? ?Assessment/Plan:  This is a 81 y.o. female who is s/p:aortobifemoral bypass, aortic endarterectomy; lysis of adhesions, ligation of inferior mesenteric vein and bilateral common femoral endarterectomy by Dr. Donzetta Matters and Dr. Virl Cagey on 12/03/21.Incisions have healed very nicely. Her ABI's today have improved bilaterally dramatically post operatively. Her extremities are well perfused with palpable distal pulses bilaterally ?- I have encouraged her to walk/ ambulate as much as tolerable ?- She will continue Aspirin and statin ?- She will continue to follow up with GI for further management of her Pancreatiits ?- She will follow up in 9 months with ABI ?- She understands that if she has new or concerning symptoms she will call for earlier follow up ? ? ?Karoline Caldwell, PA-C ?[facility] ?[phone] ? ?Clinic MD:  Dr. Virl Cagey ?

## 2022-01-09 ENCOUNTER — Other Ambulatory Visit: Payer: Self-pay | Admitting: *Deleted

## 2022-01-09 DIAGNOSIS — I739 Peripheral vascular disease, unspecified: Secondary | ICD-10-CM

## 2022-01-10 DIAGNOSIS — Z48812 Encounter for surgical aftercare following surgery on the circulatory system: Secondary | ICD-10-CM | POA: Diagnosis not present

## 2022-01-10 DIAGNOSIS — I2510 Atherosclerotic heart disease of native coronary artery without angina pectoris: Secondary | ICD-10-CM | POA: Diagnosis not present

## 2022-01-10 DIAGNOSIS — I10 Essential (primary) hypertension: Secondary | ICD-10-CM | POA: Diagnosis not present

## 2022-01-10 DIAGNOSIS — I739 Peripheral vascular disease, unspecified: Secondary | ICD-10-CM | POA: Diagnosis not present

## 2022-01-10 DIAGNOSIS — E46 Unspecified protein-calorie malnutrition: Secondary | ICD-10-CM | POA: Diagnosis not present

## 2022-01-10 DIAGNOSIS — K859 Acute pancreatitis without necrosis or infection, unspecified: Secondary | ICD-10-CM | POA: Diagnosis not present

## 2022-01-13 ENCOUNTER — Telehealth: Payer: Self-pay | Admitting: Gastroenterology

## 2022-01-13 MED ORDER — PANTOPRAZOLE SODIUM 40 MG PO TBEC: 40.0000 mg | DELAYED_RELEASE_TABLET | Freq: Every day | ORAL | 1 refills | Status: DC

## 2022-01-13 NOTE — Telephone Encounter (Signed)
Inbound call from patient stating she was prescribed pantoprazole when seen at the hospital and is wanting to know if she needs to continue taking it.  Please advise. ?

## 2022-01-13 NOTE — Telephone Encounter (Signed)
Called and spoke to patient. She has been taking pantoprazole 40 mg since she has been in the hospital and indicated that no one told her she should keep taking it or stop.  At her last office visit on 2-28, Dr. Candis Schatz did not take her off of it so I encouraged the patient to take it this week and I told her I would confirm with Dr. Candis Schatz next week when he returns.  I let her know that if he wants her to continue she will not hear from me but if he wants her to stop I will call her. (Patient indicates she doesn't know if it helps her or not but is comfortable continuing taking it for now).  Refill sent to [facility] on [address].  ?

## 2022-01-16 NOTE — Telephone Encounter (Signed)
Called an spoke to patient. Advised her that she can stop pantoprazole per Dr. Candis Schatz. She expressed understanding. Med removed from her medication list. ?

## 2022-01-16 NOTE — Addendum Note (Signed)
Addended by: Roetta Sessions on: 01/16/2022 06:19 PM ? ? Modules accepted: Orders ? ?

## 2022-02-05 DIAGNOSIS — I10 Essential (primary) hypertension: Secondary | ICD-10-CM | POA: Diagnosis not present

## 2022-02-05 DIAGNOSIS — J449 Chronic obstructive pulmonary disease, unspecified: Secondary | ICD-10-CM | POA: Diagnosis not present

## 2022-02-05 DIAGNOSIS — E559 Vitamin D deficiency, unspecified: Secondary | ICD-10-CM | POA: Diagnosis not present

## 2022-02-05 DIAGNOSIS — E782 Mixed hyperlipidemia: Secondary | ICD-10-CM | POA: Diagnosis not present

## 2022-02-05 DIAGNOSIS — I739 Peripheral vascular disease, unspecified: Secondary | ICD-10-CM | POA: Diagnosis not present

## 2022-02-05 DIAGNOSIS — G629 Polyneuropathy, unspecified: Secondary | ICD-10-CM | POA: Diagnosis not present

## 2022-02-05 DIAGNOSIS — R5382 Chronic fatigue, unspecified: Secondary | ICD-10-CM | POA: Diagnosis not present

## 2022-02-07 ENCOUNTER — Other Ambulatory Visit: Payer: Self-pay | Admitting: Gastroenterology

## 2022-02-17 ENCOUNTER — Encounter (HOSPITAL_COMMUNITY): Payer: Self-pay | Admitting: Gastroenterology

## 2022-02-17 NOTE — Progress Notes (Signed)
Attempted to obtain medical history via telephone, unable to reach at this time. I left a voicemail to return pre surgical testing department's phone call.  

## 2022-02-24 ENCOUNTER — Encounter (HOSPITAL_COMMUNITY): Payer: Self-pay | Admitting: Gastroenterology

## 2022-02-24 ENCOUNTER — Encounter (HOSPITAL_COMMUNITY)
Admission: RE | Disposition: A | Discharge status: Home / Self Care | Payer: Self-pay | Source: Ambulatory Visit | Attending: Gastroenterology

## 2022-02-24 ENCOUNTER — Ambulatory Visit (HOSPITAL_COMMUNITY): Payer: Medicare Other | Admitting: Certified Registered Nurse Anesthetist

## 2022-02-24 ENCOUNTER — Other Ambulatory Visit: Payer: Self-pay

## 2022-02-24 ENCOUNTER — Ambulatory Visit (HOSPITAL_BASED_OUTPATIENT_CLINIC_OR_DEPARTMENT_OTHER): Payer: Medicare Other | Admitting: Certified Registered Nurse Anesthetist

## 2022-02-24 ENCOUNTER — Ambulatory Visit (HOSPITAL_COMMUNITY)
Admission: RE | Admit: 2022-02-24 | Discharge: 2022-02-24 | Disposition: A | Discharge status: Home / Self Care | Payer: Medicare Other | Source: Ambulatory Visit | Attending: Gastroenterology | Admitting: Gastroenterology

## 2022-02-24 DIAGNOSIS — I899 Noninfective disorder of lymphatic vessels and lymph nodes, unspecified: Secondary | ICD-10-CM | POA: Diagnosis not present

## 2022-02-24 DIAGNOSIS — K3189 Other diseases of stomach and duodenum: Secondary | ICD-10-CM | POA: Diagnosis not present

## 2022-02-24 DIAGNOSIS — K449 Diaphragmatic hernia without obstruction or gangrene: Secondary | ICD-10-CM

## 2022-02-24 DIAGNOSIS — K31811 Angiodysplasia of stomach and duodenum with bleeding: Secondary | ICD-10-CM

## 2022-02-24 DIAGNOSIS — K2289 Other specified disease of esophagus: Secondary | ICD-10-CM | POA: Insufficient documentation

## 2022-02-24 DIAGNOSIS — K8689 Other specified diseases of pancreas: Secondary | ICD-10-CM | POA: Diagnosis not present

## 2022-02-24 DIAGNOSIS — J449 Chronic obstructive pulmonary disease, unspecified: Secondary | ICD-10-CM | POA: Diagnosis not present

## 2022-02-24 DIAGNOSIS — F17210 Nicotine dependence, cigarettes, uncomplicated: Secondary | ICD-10-CM | POA: Insufficient documentation

## 2022-02-24 DIAGNOSIS — K31819 Angiodysplasia of stomach and duodenum without bleeding: Secondary | ICD-10-CM | POA: Insufficient documentation

## 2022-02-24 DIAGNOSIS — R188 Other ascites: Secondary | ICD-10-CM | POA: Diagnosis not present

## 2022-02-24 DIAGNOSIS — Q453 Other congenital malformations of pancreas and pancreatic duct: Secondary | ICD-10-CM | POA: Diagnosis not present

## 2022-02-24 DIAGNOSIS — K2970 Gastritis, unspecified, without bleeding: Secondary | ICD-10-CM | POA: Diagnosis not present

## 2022-02-24 DIAGNOSIS — I10 Essential (primary) hypertension: Secondary | ICD-10-CM | POA: Diagnosis not present

## 2022-02-24 DIAGNOSIS — K859 Acute pancreatitis without necrosis or infection, unspecified: Secondary | ICD-10-CM | POA: Insufficient documentation

## 2022-02-24 DIAGNOSIS — I739 Peripheral vascular disease, unspecified: Secondary | ICD-10-CM | POA: Diagnosis not present

## 2022-02-24 DIAGNOSIS — G709 Myoneural disorder, unspecified: Secondary | ICD-10-CM | POA: Diagnosis not present

## 2022-02-24 HISTORY — PX: ESOPHAGOGASTRODUODENOSCOPY (EGD) WITH PROPOFOL: SHX5813

## 2022-02-24 HISTORY — PX: BIOPSY: SHX5522

## 2022-02-24 HISTORY — PX: EUS: SHX5427

## 2022-02-24 HISTORY — PX: HOT HEMOSTASIS: SHX5433

## 2022-02-24 SURGERY — UPPER ENDOSCOPIC ULTRASOUND (EUS) RADIAL
Anesthesia: Monitor Anesthesia Care

## 2022-02-24 MED ORDER — PANTOPRAZOLE SODIUM 20 MG PO TBEC: 20.0000 mg | DELAYED_RELEASE_TABLET | Freq: Every day | ORAL | 6 refills | Status: DC

## 2022-02-24 MED ORDER — PHENYLEPHRINE 80 MCG/ML (10ML) SYRINGE FOR IV PUSH (FOR BLOOD PRESSURE SUPPORT)
PREFILLED_SYRINGE | INTRAVENOUS | Status: DC | PRN
  Administered 2022-02-24 (×3): 80 ug via INTRAVENOUS

## 2022-02-24 MED ORDER — PROPOFOL 10 MG/ML IV BOLUS
INTRAVENOUS | Status: DC | PRN
  Administered 2022-02-24 (×3): 20 mg via INTRAVENOUS

## 2022-02-24 MED ORDER — PROPOFOL 500 MG/50ML IV EMUL
INTRAVENOUS | Status: DC | PRN
  Administered 2022-02-24: 125 ug/kg/min via INTRAVENOUS

## 2022-02-24 MED ORDER — LIDOCAINE HCL (CARDIAC) PF 100 MG/5ML IV SOSY
PREFILLED_SYRINGE | INTRAVENOUS | Status: DC | PRN
  Administered 2022-02-24: 50 mg via INTRAVENOUS

## 2022-02-24 MED ORDER — LACTATED RINGERS IV SOLN
INTRAVENOUS | Status: DC
  Administered 2022-02-24: 1000 mL via INTRAVENOUS

## 2022-02-24 SURGICAL SUPPLY — 15 items

## 2022-02-24 NOTE — Op Note (Addendum)
[facility] ?Patient Name: Elizabeth [name] ?Procedure Date: 02/24/2022 ?MRN: [id number] ?Attending MD: Justice Britain , MD ?Date of Birth: 01-20-1941 ?CSN: [id number] ?Age: 81 ?Admit Type: Outpatient ?Procedure:                Upper EUS ?Indications:              Dilated pancreatic duct on MRI, Abnormal abdominal  ?                          MRI, Acute recurrent pancreatitis ?Providers:                Justice Britain, MD, Burtis Junes, RN, Hinton Dyer  ?                          Technician, Technician ?Referring MD:             Gladstone Pih. Candis Schatz, MD, [name] MD, MD ?Medicines:                Monitored Anesthesia Care ?Complications:            No immediate complications. ?Estimated Blood Loss:     Estimated blood loss was minimal. ?Procedure:                Pre-Anesthesia Assessment: ?                          - Prior to the procedure, a History and Physical  ?                          was performed, and patient medications and  ?                          allergies were reviewed. The patient's tolerance of  ?                          previous anesthesia was also reviewed. The risks  ?                          and benefits of the procedure and the sedation  ?                          options and risks were discussed with the patient.  ?                          All questions were answered, and informed consent  ?                          was obtained. Prior Anticoagulants: The patient has  ?                          taken no previous anticoagulant or antiplatelet  ?                          agents except for aspirin. ASA Grade Assessment:  ?  III - A patient with severe systemic disease. After  ?                          reviewing the risks and benefits, the patient was  ?                          deemed in satisfactory condition to undergo the  ?                          procedure. ?                          After obtaining informed consent, the endoscope was  ?                           passed under direct vision. Throughout the  ?                          procedure, the patient's blood pressure, pulse, and  ?                          oxygen saturations were monitored continuously. The  ?                          [id number]-[id number] ([id number]) Olympus endoscope was introduced  ?                          through the mouth, and advanced to the second part  ?                          of duodenum. The [id number] ([id number]) Olympus  ?                          duodenoscope was introduced through the mouth, and  ?                          advanced to the area of papilla. The [id number]  ?                          ([id number]) Olympus linear ultrasound scope was  ?                          introduced through the mouth, and advanced to the  ?                          duodenum for ultrasound examination from the  ?                          stomach and duodenum. The upper EUS was  ?                          accomplished without difficulty. The patient  ?  tolerated the procedure. ?Scope In: ?Scope Out: ?Findings: ?     ENDOSCOPIC FINDING: : ?     No gross lesions were noted in the entire esophagus. ?     The Z-line was irregular and was found 38 cm from the incisors. ?     A 3 cm hiatal hernia was present. ?     A single diminutive angioectasia with bleeding was found on the greater  ?     curvature of the stomach. Fulguration to ablate the lesion by argon  ?     plasma was successful. ?     Patchy mildly erythematous mucosa without bleeding was found in the  ?     entire examined stomach. Biopsies were taken with a cold forceps for  ?     histology and Helicobacter pylori testing. ?     No gross lesions were noted in the duodenal bulb, in the first portion  ?     of the duodenum and in the second portion of the duodenum. ?     The minor papilla was small. ?     The major papilla was flat and small but otherwise normal in appearance. ?     ENDOSONOGRAPHIC FINDING: : ?     The pancreatic duct  showed evidence of pancreatic divisum. ?     The pancreatic duct had a dilated endosonographic appearance in the  ?     pancreatic head (1.6 mm -> 3.0 mm) without any clear stricturing being  ?     present and no mass/lesion present, genu of the pancreas (3.0 mm -> 4.5  ?     mm), body of the pancreas(3.5 mm) and tail of the pancreas (2.1 mm). ?     The pancreatic duct had prominent side-branches on endosonographic  ?     appearance in the body of the pancreas and tail of the pancreas. ?     Pancreatic parenchymal abnormalities were noted in the entire pancreas.  ?     These consisted of lobularity without honeycombing and hyperechoic  ?     strands. ?     There was no sign of significant endosonographic abnormality in the  ?     common bile duct (4.4 mm) and in the common hepatic duct (4.3 mm). An  ?     unremarkable gallbladder was identified. ?     Endosonographic imaging of the ampulla showed no mass-lesion. ?     A moderate sized collection of fluid, visualized as an irregular  ?     heterogenous structure, was found in the retroperitoneum - this is in  ?     the location of previously noted RP fluid collection and not clearly  ?     pancreatic in origin (it looks smaller than what is reported on previous  ?     imaging study) - not sampled today. ?     Endosonographic imaging in the visualized portion of the liver showed no  ?     mass. ?     No malignant-appearing lymph nodes were visualized in the celiac region  ?     (level 20), peripancreatic region and porta hepatis region. ?     The celiac region was visualized. ?Impression:               EGD Impression: ?                          -  No gross lesions in esophagus. Z-line irregular,  ?                          38 cm from the incisors. ?                          - 3 cm hiatal hernia. ?                          - A single bleeding angioectasia in the stomach.  ?                          Treated with argon plasma coagulation (APC). ?                           - Erythematous mucosa in the stomach. Biopsied. ?                          - No gross lesions in the duodenal bulb, in the  ?                          first portion of the duodenum and in the second  ?                          portion of the duodenum. ?                          - Small minor papilla. ?                          - Flat major papilla. ?                          EUS Impression: ?                          - Pancreatic divisum is noted. The pancreatic duct  ?                          had a dilated endosonographic appearance in the  ?                          pancreatic head traversing into the genu of the  ?                          pancreas. No overt stricturing or mass/lesion was  ?                          noted. The body of the pancreas and tail of the  ?                          pancreas ducts were dilated somewhat. ?                          - The pancreatic duct had prominent  side-branches  ?                          on endosonographic appearance in the body of the  ?                          pancreas and tail of the pancreas. ?                          - Pancreatic parenchymal abnormalities consisting  ?                          of lobularity and hyperechoic strands were noted in  ?                          the entire pancreas. ?                          - There was no sign of significant pathology in the  ?                          common bile duct and in the common hepatic duct. ?                          - A retroperitoneal fluid collection was present.  ?                          Based on previous imaging from 2/23, this looks to  ?                          be smaller on today's EUS examination. It is  ?                          heterogenous. It is not clear if this is a true  ?                          post-operative fluid collection as had been alluded  ?                          to or a peripancreatic fluid collection with  ?                          necrosis (though the rest of the  pancreas looking  ?                          as it does makes that seem less likely). This could  ?                          be sampled if necessary, but not clear that it  ?                          needs

## 2022-02-24 NOTE — Transfer of Care (Signed)
Immediate Anesthesia Transfer of Care Note ? ?Patient: Elizabeth [name] ? ?Procedure(s) Performed: UPPER ENDOSCOPIC ULTRASOUND (EUS) RADIAL ?ESOPHAGOGASTRODUODENOSCOPY (EGD) WITH PROPOFOL ?BIOPSY ?HOT HEMOSTASIS (ARGON PLASMA COAGULATION/BICAP) ? ?Patient Location: Endoscopy Unit ? ?Anesthesia Type:MAC ? ?Level of Consciousness: drowsy ? ?Airway & Oxygen Therapy: Patient Spontanous Breathing and Patient connected to face mask oxygen ? ?Post-op Assessment: Report given to RN and Post -op Vital signs reviewed and stable ? ?Post vital signs: Reviewed and stable ? ?Last Vitals:  ?Vitals Value Taken Time  ?BP 114/19 1210  ?Temp    ?Pulse 56 1210  ?Resp 12 1210  ?SpO2 100 1210  ? ? ?Last Pain:  ?Vitals:  ? 02/24/22 1052  ?TempSrc: Oral  ?PainSc: 0-No pain  ?   ? ?  ? ?Complications: No notable events documented. ?

## 2022-02-24 NOTE — H&P (Signed)
? ?GASTROENTEROLOGY PROCEDURE H&P NOTE  ? ?Primary Care Physician: ?[name], MD ? ?HPI: ?Elizabeth [name] is a 81 y.o. female who presents for EGD/EUS to evaluate for idiopathic pancreatitis; has Pancreatic divisum.  Only 2 episodes of pancreatitis both noted after surgeries. ? ?Past Medical History:  ?Diagnosis Date  ? Abdominal cramping   ? possibly irritable bowel syndrome versus adhesions secondary to multiple surgeries and abdominal inflammation and 2009 secondary to diverticulitis and diverticular phlegmon  ? Allergic rhinitis   ? Arthritis   ? Carotid artery disease ([place])   ? 58-85% RICA, < 02% LICA 77/4/12 [name] ([facility])  ? Cataract   ? Chest pain syndrome 2004  ? with adenosine Cardiolite that was normal  ? Chronic hoarseness   ? COPD (chronic obstructive pulmonary disease) ([place])   ? Dyspnea   ? GERD (gastroesophageal reflux disease)   ? Heart murmur   ? Mild AS, AI. AV pk grad 18.79 mmHg, mn grad 11.20 mmHg, AVA (VTI) 1.05 cm2 10/10/20, 3 year f/u rec ([facility])  ? History of kidney stones   ? History of renal calculi   ? Hypercholesterolemia   ? Hypertension   ? Incontinence   ? Cough incontinence  ? Mild depression   ? Neuromuscular disorder ([place])   ? neuropathy bilateral lower extremities  ? Osteopenia   ? Peripheral vascular disease ([place])   ? with femoral and carotid bruits  ? PONV (postoperative nausea and vomiting)   ? Recurrent pancreatitis   ? Tobacco dependence   ? Vitamin D deficiency   ? ?Past Surgical History:  ?Procedure Laterality Date  ? ABDOMINAL AORTOGRAM W/LOWER EXTREMITY N/A 09/18/2021  ? Procedure: ABDOMINAL AORTOGRAM W/LOWER EXTREMITY;  Surgeon: [name], MD;  Location: [facility];  Service: Cardiovascular;  Laterality: N/A;  ? AORTA - BILATERAL FEMORAL ARTERY BYPASS GRAFT Bilateral 12/03/2021  ? Procedure: AORTOBIFEMORAL BYPASS GRAFT USING A 14 X 60m HEMASHIELD GOLD GRAFT;  Surgeon: RBroadus John MD;  Location: [facility]  Service: Vascular;  Laterality: Bilateral;  ?  APPENDECTOMY    ? BREAST BIOPSY    ? CATARACT EXTRACTION, BILATERAL    ? COLON SURGERY    ? 2009  ? COSMETIC SURGERY    ? on neck and eyes  ? DILATION AND CURETTAGE OF UTERUS    ? EYE SURGERY    ? 2014  ? FEMUR SURGERY  12/2007  ? left femur surgery--for femoral neck fracture status post fall   ? FRACTURE SURGERY    ? broke left leg and left arm- 2012  ? LIPOSUCTION    ? LUMBAR LAMINECTOMY/DECOMPRESSION MICRODISCECTOMY N/A 11/12/2020  ? Procedure: LAMINECTOMY AND FORAMINOTOMY Lumbar three four, Lumbar four five, Lumbar Five Sacral one;  Surgeon: JNewman Pies MD;  Location: [facility]  Service: Neurosurgery;  Laterality: N/A;  ? OTHER SURGICAL HISTORY    ? sigmoid colectomy  ? SHOULDER SURGERY    ? arthroscopic shoulder surgery   ? TONSILLECTOMY    ? TUBAL LIGATION    ? ?Current Facility-Administered Medications  ?Medication Dose Route Frequency Provider Last Rate Last Admin  ? lactated ringers infusion   Intravenous Continuous Mansouraty, GTelford Nab, MD      ? ? ?Current Facility-Administered Medications:  ?  lactated ringers infusion, , Intravenous, Continuous, Mansouraty, GTelford Nab, MD ?Allergies  ?Allergen Reactions  ? Alendronate Sodium   ?  loose teeth  ? Azithromycin   ?  Other reaction(s): N,V,D..Marland KitchenMarland Kitchen 1 time occurence?  ?  Chantix [Varenicline]   ?  Other reaction(s): N/V/D and flu sxs  ? Other Other (See Comments)  ?  Chlorexolone - unknown reaction  ? Pollen Extract   ?  Allergic to tree pollens  ? Tegretol [Carbamazepine] Swelling  ? Lisinopril Rash  ? Tetracyclines & Related Rash  ? ?Family History  ?Problem Relation Age of Onset  ? Heart disease Mother   ? Stroke Mother   ? Colon cancer Neg Hx   ? Esophageal cancer Neg Hx   ? Stomach cancer Neg Hx   ? ?Social History  ? ?Socioeconomic History  ? Marital status: Widowed  ?  Spouse name: Not on file  ? Number of children: Not on file  ? Years of education: Not on file  ? Highest education level: Not on file  ?Occupational History  ? Not on file  ?Tobacco  Use  ? Smoking status: Some Days  ?  Packs/day: 1.00  ?  Years: 60.00  ?  Pack years: 60.00  ?  Types: Cigarettes  ?  Passive exposure: Never  ? Smokeless tobacco: Never  ?Vaping Use  ? Vaping Use: Never used  ?Substance and Sexual Activity  ? Alcohol use: Yes  ?  Alcohol/week: 0.0 standard drinks  ?  Comment: Occ glass of wine  ? Drug use: No  ? Sexual activity: Not on file  ?Other Topics Concern  ? Not on file  ?Social History Narrative  ? Not on file  ? ?Social Determinants of Health  ? ?Financial Resource Strain: Not on file  ?Food Insecurity: Not on file  ?Transportation Needs: Not on file  ?Physical Activity: Not on file  ?Stress: Not on file  ?Social Connections: Not on file  ?Intimate Partner Violence: Not on file  ? ? ?Physical Exam: ?There were no vitals filed for this visit. ?There is no height or weight on file to calculate BMI. ?GEN: NAD ?EYE: Sclerae anicteric ?ENT: MMM ?CV: Non-tachycardic ?GI: Soft, NT/ND ?NEURO:  Alert & Oriented x 3 ? ?Lab Results: ?No results for input(s): WBC, HGB, HCT, PLT in the last 72 hours. ?BMET ?No results for input(s): NA, K, CL, CO2, GLUCOSE, BUN, CREATININE, CALCIUM in the last 72 hours. ?LFT ?No results for input(s): PROT, ALBUMIN, AST, ALT, ALKPHOS, BILITOT, BILIDIR, IBILI in the last 72 hours. ?PT/INR ?No results for input(s): LABPROT, INR in the last 72 hours. ? ? ?Impression / Plan: ?This is a 81 y.o.female who presents for EGD/EUS to evaluate for idiopathic pancreatitis; has Pancreatic divisum.  Only 2 episodes of pancreatitis both noted after surgeries. ? ?The risks and benefits of endoscopic evaluation/treatment were discussed with the patient and/or family; these include but are not limited to the risk of perforation, infection, bleeding, missed lesions, lack of diagnosis, severe illness requiring hospitalization, as well as anesthesia and sedation related illnesses.  The patient's history has been reviewed, patient examined, no change in status, and deemed  stable for procedure.  The patient and/or family is agreeable to proceed.  ? ? ?Justice Britain, MD ?[facility] ?Advanced Endoscopy ?Office # [phone] ? ?

## 2022-02-24 NOTE — Anesthesia Preprocedure Evaluation (Addendum)
Anesthesia Evaluation  ?Patient identified by MRN, date of birth, ID band ?Patient awake ? ? ? ?Reviewed: ?Allergy & Precautions, NPO status , Patient's Chart, lab work & pertinent test results ? ?History of Anesthesia Complications ?(+) PONV and history of anesthetic complications ? ?Airway ?Mallampati: II ? ?TM Distance: >3 FB ?Neck ROM: Full ? ? ? Dental ? ?(+) Upper Dentures ?  ?Pulmonary ?COPD, Current Smoker and Patient abstained from smoking.,  ?  ?Pulmonary exam normal ? ? ? ? ? ? ? Cardiovascular ?hypertension, Pt. on medications and Pt. on home beta blockers ?+ Peripheral Vascular Disease  ?Normal cardiovascular exam ? ? ?  ?Neuro/Psych ?PSYCHIATRIC DISORDERS Depression  Neuromuscular disease   ? GI/Hepatic ?negative GI ROS, Neg liver ROS,   ?Endo/Other  ?negative endocrine ROS ? Renal/GU ?negative Renal ROS  ? ?  ?Musculoskeletal ?negative musculoskeletal ROS ?(+)  ? Abdominal ?  ?Peds ? Hematology ?negative hematology ROS ?(+)   ?Anesthesia Other Findings ?pancreatits ? Reproductive/Obstetrics ? ?  ? ? ? ? ? ? ? ? ? ? ? ? ? ?  ?  ? ? ? ? ? ? ? ?Anesthesia Physical ?Anesthesia Plan ? ?ASA: 3 ? ?Anesthesia Plan: MAC  ? ?Post-op Pain Management:   ? ?Induction: Intravenous ? ?PONV Risk Score and Plan: 2 and Propofol infusion and Treatment may vary due to age or medical condition ? ?Airway Management Planned: Nasal Cannula ? ?Additional Equipment:  ? ?Intra-op Plan:  ? ?Post-operative Plan:  ? ?Informed Consent: I have reviewed the patients History and Physical, chart, labs and discussed the procedure including the risks, benefits and alternatives for the proposed anesthesia with the patient or authorized representative who has indicated his/her understanding and acceptance.  ? ? ? ?Dental advisory given ? ?Plan Discussed with: CRNA ? ?Anesthesia Plan Comments:   ? ? ? ? ? ? ?Anesthesia Quick Evaluation ? ?

## 2022-02-24 NOTE — Discharge Instructions (Signed)
YOU HAD AN ENDOSCOPIC PROCEDURE TODAY: Refer to the procedure report and other information in the discharge instructions given to you for any specific questions about what was found during the examination. If this information does not answer your questions, please call [place] office at [phone] to clarify.   YOU SHOULD EXPECT: Some feelings of bloating in the abdomen. Passage of more gas than usual. Walking can help get rid of the air that was put into your GI tract during the procedure and reduce the bloating. If you had a lower endoscopy (such as a colonoscopy or flexible sigmoidoscopy) you may notice spotting of blood in your stool or on the toilet paper. Some abdominal soreness may be present for a day or two, also.  DIET: Your first meal following the procedure should be a light meal and then it is ok to progress to your normal diet. A half-sandwich or bowl of soup is an example of a good first meal. Heavy or fried foods are harder to digest and may make you feel nauseous or bloated. Drink plenty of fluids but you should avoid alcoholic beverages for 24 hours. If you had a esophageal dilation, please see attached instructions for diet.    ACTIVITY: Your care partner should take you home directly after the procedure. You should plan to take it easy, moving slowly for the rest of the day. You can resume normal activity the day after the procedure however YOU SHOULD NOT DRIVE, use power tools, machinery or perform tasks that involve climbing or major physical exertion for 24 hours (because of the sedation medicines used during the test).   SYMPTOMS TO REPORT IMMEDIATELY: A gastroenterologist can be reached at any hour. Please call [phone]  for any of the following symptoms:   Following upper endoscopy (EGD, EUS, ERCP, esophageal dilation) Vomiting of blood or coffee ground material  New, significant abdominal pain  New, significant chest pain or pain under the shoulder blades  Painful or  persistently difficult swallowing  New shortness of breath  Black, tarry-looking or red, bloody stools  FOLLOW UP:  If any biopsies were taken you will be contacted by phone or by letter within the next 1-3 weeks. Call [phone]  if you have not heard about the biopsies in 3 weeks.  Please also call with any specific questions about appointments or follow up tests.  

## 2022-02-24 NOTE — Anesthesia Postprocedure Evaluation (Signed)
Anesthesia Post Note ? ?Patient: Elizabeth [name] ? ?Procedure(s) Performed: UPPER ENDOSCOPIC ULTRASOUND (EUS) RADIAL ?ESOPHAGOGASTRODUODENOSCOPY (EGD) WITH PROPOFOL ?BIOPSY ?HOT HEMOSTASIS (ARGON PLASMA COAGULATION/BICAP) ? ?  ? ?Patient location during evaluation: Endoscopy ?Anesthesia Type: MAC ?Level of consciousness: awake ?Pain management: pain level controlled ?Vital Signs Assessment: post-procedure vital signs reviewed and stable ?Respiratory status: spontaneous breathing, nonlabored ventilation, respiratory function stable and patient connected to nasal cannula oxygen ?Cardiovascular status: stable and blood pressure returned to baseline ?Postop Assessment: no apparent nausea or vomiting ?Anesthetic complications: no ? ? ?No notable events documented. ? ?Last Vitals:  ?Vitals:  ? 02/24/22 1230 02/24/22 1240  ?BP: (!) 136/53 (!) 150/49  ?Pulse: 64 61  ?Resp: 20 15  ?Temp:    ?SpO2: 99% 98%  ?  ?Last Pain:  ?Vitals:  ? 02/24/22 1240  ?TempSrc:   ?PainSc: 0-No pain  ? ? ?  ?  ?  ?  ?  ?  ? ?Naela Nodal P Merriel Zinger ? ? ? ? ?

## 2022-02-25 ENCOUNTER — Encounter: Payer: Self-pay | Admitting: Gastroenterology

## 2022-02-25 LAB — SURGICAL PATHOLOGY

## 2022-02-26 ENCOUNTER — Encounter (HOSPITAL_COMMUNITY): Payer: Self-pay | Admitting: Gastroenterology

## 2022-02-26 ENCOUNTER — Telehealth: Payer: Self-pay

## 2022-02-26 DIAGNOSIS — D649 Anemia, unspecified: Secondary | ICD-10-CM

## 2022-02-26 NOTE — Telephone Encounter (Signed)
The patient has been notified of this information and all questions answered.

## 2022-02-26 NOTE — Telephone Encounter (Signed)
-----   Message from Irving Copas., MD sent at 02/25/2022  4:54 PM EDT ----- ?Regarding: Follow-up ?Antwane Grose, ?As per my procedure note can we have the patient come in for a CBC/iron/TIBC/ferritin/B12/folate for further evaluation of her anemia.  In the setting of an actively oozing AVM she may need additional work-up if she has evidence of iron deficiency. ?Please put those labs under Dr. Dayle Points name. ?Thanks. ?GM ? ?

## 2022-02-26 NOTE — Telephone Encounter (Signed)
Lab orders have been entered  ? ?Left message on machine to call back  ?

## 2022-02-28 ENCOUNTER — Other Ambulatory Visit (INDEPENDENT_AMBULATORY_CARE_PROVIDER_SITE_OTHER): Payer: Medicare Other

## 2022-02-28 DIAGNOSIS — D649 Anemia, unspecified: Secondary | ICD-10-CM

## 2022-02-28 LAB — CBC WITH DIFFERENTIAL/PLATELET
Basophils Absolute: 0.1 10*3/uL (ref 0.0–0.1)
Basophils Relative: 1.3 % (ref 0.0–3.0)
Eosinophils Absolute: 0.2 10*3/uL (ref 0.0–0.7)
Eosinophils Relative: 2.7 % (ref 0.0–5.0)
HCT: 36.7 % (ref 36.0–46.0)
Hemoglobin: 12.4 g/dL (ref 12.0–15.0)
Lymphocytes Relative: 30.1 % (ref 12.0–46.0)
Lymphs Abs: 2.3 10*3/uL (ref 0.7–4.0)
MCHC: 33.6 g/dL (ref 30.0–36.0)
MCV: 88.6 fl (ref 78.0–100.0)
Monocytes Absolute: 0.5 10*3/uL (ref 0.1–1.0)
Monocytes Relative: 6.4 % (ref 3.0–12.0)
Neutro Abs: 4.6 10*3/uL (ref 1.4–7.7)
Neutrophils Relative %: 59.5 % (ref 43.0–77.0)
Platelets: 215 10*3/uL (ref 150.0–400.0)
RBC: 4.15 Mil/uL (ref 3.87–5.11)
RDW: 14.9 % (ref 11.5–15.5)
WBC: 7.8 10*3/uL (ref 4.0–10.5)

## 2022-02-28 LAB — FOLATE: Folate: >23.5 ng/mL (ref >5.9)

## 2022-02-28 LAB — VITAMIN B12: Vitamin B-12: 908 pg/mL (ref 211–911)

## 2022-02-28 LAB — IBC + FERRITIN
Ferritin: 27.4 ng/mL (ref 10.0–291.0)
Iron: 52 ug/dL (ref 42–145)
Saturation Ratios: 15.7 % — ABNORMAL LOW (ref 20.0–50.0)
TIBC: 331.8 ug/dL (ref 250.0–450.0)
Transferrin: 237 mg/dL (ref 212.0–360.0)

## 2022-03-06 NOTE — Progress Notes (Signed)
Elizabeth [name],  ?Your hemoglobin was within normal limits, your B12 and folate levels were normal, and your iron panel was not strongly suggestive of iron deficiency anemia (iron saturation slightly low, but ferritin within normal limits).  I do not recommend further evaluation of iron deficiency anemia at this time.  Please follow up with Dr. Rush Landmark next month as scheduled to discuss possible ERCP.

## 2022-03-07 ENCOUNTER — Other Ambulatory Visit: Payer: Self-pay | Admitting: Gastroenterology

## 2022-03-19 DIAGNOSIS — G500 Trigeminal neuralgia: Secondary | ICD-10-CM | POA: Diagnosis not present

## 2022-03-19 DIAGNOSIS — G629 Polyneuropathy, unspecified: Secondary | ICD-10-CM | POA: Diagnosis not present

## 2022-03-19 DIAGNOSIS — R5382 Chronic fatigue, unspecified: Secondary | ICD-10-CM | POA: Diagnosis not present

## 2022-03-19 DIAGNOSIS — I10 Essential (primary) hypertension: Secondary | ICD-10-CM | POA: Diagnosis not present

## 2022-04-15 ENCOUNTER — Ambulatory Visit (INDEPENDENT_AMBULATORY_CARE_PROVIDER_SITE_OTHER): Payer: Medicare Other | Admitting: Gastroenterology

## 2022-04-15 VITALS — BP 124/52 | HR 76 | Ht 60.0 in | Wt 103.1 lb

## 2022-04-15 DIAGNOSIS — R188 Other ascites: Secondary | ICD-10-CM

## 2022-04-15 DIAGNOSIS — Z8719 Personal history of other diseases of the digestive system: Secondary | ICD-10-CM

## 2022-04-15 DIAGNOSIS — K2970 Gastritis, unspecified, without bleeding: Secondary | ICD-10-CM

## 2022-04-15 DIAGNOSIS — D649 Anemia, unspecified: Secondary | ICD-10-CM

## 2022-04-15 DIAGNOSIS — Q453 Other congenital malformations of pancreas and pancreatic duct: Secondary | ICD-10-CM | POA: Diagnosis not present

## 2022-04-15 DIAGNOSIS — K31819 Angiodysplasia of stomach and duodenum without bleeding: Secondary | ICD-10-CM

## 2022-04-15 DIAGNOSIS — R109 Unspecified abdominal pain: Secondary | ICD-10-CM

## 2022-04-15 NOTE — Progress Notes (Unsigned)
Elizabeth [name]   Primary Care Provider [name], MD [address] [place] [address] [phone]  Referring Provider [name], MD [address] [place] [address],  [place] [address] [phone]  Patient Profile: Elizabeth [name] is a 81 y.o. female with a pmh significant for  The patient presents to the [facility] for an evaluation and management of problem(s) noted below:  Problem List No diagnosis found.  History of Present Illness    The patient does/does not take NSAIDs or BC/Goody Powder. Patient has/has not had an EGD. Patient has/has not had a Colonoscopy.  GI Review of Systems Positive as above Negative for  Pyrosis; Reflux; Regurgitation; Dysphagia; Odynophagia; Globus; Post-prandial cough; Nocturnal cough; Nasal regurgitation; Epigastric pain; Nausea; Vomiting; Hematemesis; Jaundice; Change in Appetite; Early satiety; Abdominal pain; Abdominal bloating; Eructation; Flatulence; Change in BM Frequency; Change in BM Consistency; Constipation; Diarrhea; Incontinence; Urgency; Tenesmus; Hematochezia; Melena  Review of Systems General: Denies fevers/chills/weight loss/night sweats HEENT: Denies oral lesions/sore throat/headaches/visual changes Cardiovascular: Denies chest pain/palpitations Pulmonary: Denies shortness of breath/cough Gastroenterological: See HPI Genitourinary: Denies darkened urine or hematuria Hematological: Denies easy bruising/bleeding Endocrine: Denies temperature intolerance Dermatological: Denies skin changes Psychological: Mood is stable Allergy & Immunology: Denies severe allergic reactions Musculoskeletal: Denies new arthralgias   Medications Current Outpatient Medications  Medication Sig Dispense Refill   aspirin EC 81 MG tablet Take 81 mg by mouth at bedtime. Swallow whole.     b complex vitamins capsule Take 1 capsule by mouth daily.     cloNIDine  (CATAPRES) 0.1 MG tablet Take 0.2 mg by mouth at bedtime.     Coenzyme Q10 100 MG capsule Take 100 mg by mouth every morning.     feeding supplement (ENSURE ENLIVE / ENSURE PLUS) LIQD Take 237 mLs by mouth 3 (three) times daily between meals. 237 mL 12   gabapentin (NEURONTIN) 100 MG capsule Take 2 capsules (200 mg total) by mouth at bedtime. PATIENT NEEDS OFFICE [name] FOR ADDITIONAL REFILLS (Patient taking differently: Take 100-200 mg by mouth See admin instructions. Take one capsule (100 mg) by mouth every morning and two capsules (200 mg) at night) 60 capsule 0   INCRUSE ELLIPTA 62.5 MCG/INH AEPB Inhale 1 puff into the lungs daily. 30 each 1   irbesartan-hydrochlorothiazide (AVALIDE) 150-12.5 MG tablet Take 1 tablet by mouth daily.     Menthol, Topical Analgesic, (ICY HOT EX) Apply 1 application topically daily as needed (pain).     metoprolol succinate (TOPROL-XL) 25 MG 24 hr tablet Take 25 mg by mouth at bedtime.     Multiple Vitamin (MULTIVITAMIN WITH MINERALS) TABS tablet Take 1 tablet by mouth 2 (two) times daily.     pantoprazole (PROTONIX) 20 MG tablet Take 1 tablet (20 mg total) by mouth daily. 30 tablet 6   simvastatin (ZOCOR) 40 MG tablet Take 40 mg by mouth at bedtime.     TURMERIC CURCUMIN PO Take 1 capsule by mouth daily. With Ginger     VITAMIN D PO Take 1 capsule by mouth daily.     VITAMIN E PO Take 1 capsule by mouth daily.     No current facility-administered medications for this [name].    Allergies Allergies  Allergen Reactions   Alendronate Sodium     loose teeth   Azithromycin     Other reaction(s): N,V,D.Marland KitchenMarland Kitchen? 1 time occurence?   Chantix [Varenicline]     Other reaction(s): N/V/D and flu sxs   Other  Other (See Comments)    Chlorexolone - unknown reaction   Pollen Extract     Allergic to tree pollens   Tegretol [Carbamazepine] Swelling   Lisinopril Rash   Tetracyclines & Related Rash    Histories Past Medical History:  Diagnosis Date   Abdominal cramping     possibly irritable bowel syndrome versus adhesions secondary to multiple surgeries and abdominal inflammation and 2009 secondary to diverticulitis and diverticular phlegmon   Allergic rhinitis    Arthritis    Carotid artery disease ([facility])    76-54% RICA, < 65% LICA 01/05/45 [name] ([facility])   Cataract    Chest pain syndrome 2004   with adenosine Cardiolite that was normal   Chronic hoarseness    COPD (chronic obstructive pulmonary disease) ([facility])    Dyspnea    GERD (gastroesophageal reflux disease)    Heart murmur    Mild AS, AI. AV pk grad 18.79 mmHg, mn grad 11.20 mmHg, AVA (VTI) 1.05 cm2 10/10/20, 3 year f/u rec ([facility])   History of kidney stones    History of renal calculi    Hypercholesterolemia    Hypertension    Incontinence    Cough incontinence   Mild depression    Neuromuscular disorder ([facility])    neuropathy bilateral lower extremities   Osteopenia    Peripheral vascular disease ([place])    with femoral and carotid bruits   PONV (postoperative nausea and vomiting)    Recurrent pancreatitis    Tobacco dependence    Vitamin D deficiency    Past Surgical History:  Procedure Laterality Date   ABDOMINAL AORTOGRAM W/LOWER EXTREMITY N/A 09/18/2021   Procedure: ABDOMINAL AORTOGRAM W/LOWER EXTREMITY;  Surgeon: [name], MD;  Location: [facility];  Service: Cardiovascular;  Laterality: N/A;   AORTA - BILATERAL FEMORAL ARTERY BYPASS GRAFT Bilateral 12/03/2021   Procedure: AORTOBIFEMORAL BYPASS GRAFT USING A 14 X 53m HEMASHIELD GOLD GRAFT;  Surgeon: RBroadus John MD;  Location: [facility]  Service: Vascular;  Laterality: Bilateral;   APPENDECTOMY     BIOPSY  02/24/2022   Procedure: BIOPSY;  Surgeon: MIrving Copas, MD;  Location: [facility];  Service: Gastroenterology;;   BREAST BIOPSY     CATARACT EXTRACTION, BILATERAL     COLON SURGERY     2009   COSMETIC SURGERY     on neck and eyes   DILATION AND CURETTAGE OF UTERUS     ESOPHAGOGASTRODUODENOSCOPY  (EGD) WITH PROPOFOL N/A 02/24/2022   Procedure: ESOPHAGOGASTRODUODENOSCOPY (EGD) WITH PROPOFOL;  Surgeon: MIrving Copas, MD;  [facility];  Service: Gastroenterology;  Laterality: N/A;   EUS N/A 02/24/2022   Procedure: UPPER ENDOSCOPIC ULTRASOUND (EUS) RADIAL;  Surgeon: MIrving Copas, MD;  [facility];  Service: Gastroenterology;  Laterality: N/A;   EYE SURGERY     2014   FEMUR SURGERY  12/2007   left femur surgery--for femoral neck fracture status post fall    FRACTURE SURGERY     broke left leg and left arm- 2012   HOT HEMOSTASIS N/A 02/24/2022   Procedure: HOT HEMOSTASIS (ARGON PLASMA COAGULATION/BICAP);  Surgeon: MIrving Copas, MD;  [facility];  Service: Gastroenterology;  Laterality: N/A;   LIPOSUCTION     LUMBAR LAMINECTOMY/DECOMPRESSION MICRODISCECTOMY N/A 11/12/2020   Procedure: LAMINECTOMY AND FORAMINOTOMY Lumbar three four, Lumbar four five, Lumbar Five Sacral one;  Surgeon: JNewman Pies MD;  [facility]  Service: Neurosurgery;  Laterality: N/A;   OTHER SURGICAL HISTORY  sigmoid colectomy   SHOULDER SURGERY     arthroscopic shoulder surgery    TONSILLECTOMY     TUBAL LIGATION     Social History   Socioeconomic History   Marital status: Widowed    Spouse name: Not on file   Number of children: Not on file   Years of education: Not on file   Highest education level: Not on file  Occupational History   Not on file  Tobacco Use   Smoking status: Some Days    Packs/day: 1.00    Years: 60.00    Total pack years: 60.00    Types: Cigarettes    Passive exposure: Never   Smokeless tobacco: Never  Vaping Use   Vaping Use: Never used  Substance and Sexual Activity   Alcohol use: Yes    Alcohol/week: 0.0 standard drinks of alcohol    Comment: Occ glass of wine   Drug use: No   Sexual activity: Not on file  Other Topics Concern   Not on file  Social History Narrative   Not on file   Social  Determinants of Health   Financial Resource Strain: Not on file  Food Insecurity: Not on file  Transportation Needs: Not on file  Physical Activity: Not on file  Stress: Not on file  Social Connections: Not on file  Intimate Partner Violence: Not on file   Family History  Problem Relation Age of Onset   Heart disease Mother    Stroke Mother    Colon cancer Neg Hx    Esophageal cancer Neg Hx    Stomach cancer Neg Hx    I have reviewed her medical, social, and family history in detail and updated the electronic medical record as necessary.    PHYSICAL EXAMINATION  BP (!) 124/52   Pulse 76   Ht 5' (1.524 m)   Wt 103 lb 2 oz (46.8 kg)   BMI 20.14 kg/m  Wt Readings from Last 3 Encounters:  04/15/22 103 lb 2 oz (46.8 kg)  02/24/22 100 lb (45.4 kg)  01/03/22 101 lb 8 oz (46 kg)   GEN: NAD, appears stated age, doesn't appear chronically ill PSYCH: Cooperative, without pressured speech EYE: Conjunctivae pink, sclerae anicteric ENT: MMM, without oral ulcers, no erythema or exudates noted NECK: Supple CV: RR without R/Gs  RESP: CTAB posteriorly, without wheezing GI: NABS, soft, NT/ND, without rebound or guarding, no HSM appreciated GU: DRE shows MSK/EXT: _ edema, no palmar erythema SKIN: No jaundice, no spider angiomata, no concerning rashes NEURO:  Alert & Oriented x 3, no focal deficits, no evidence of asterixis   REVIEW OF DATA  I reviewed the following data at the time of this encounter:  GI Procedures and Studies  ***  Laboratory Studies  ***  Imaging Studies  ***   ASSESSMENT  Ms. Khanna is a 81 y.o. female with a pmh significant for The patient is seen today for evaluation and management of:  No diagnosis found.  ***   PLAN  There are no diagnoses linked to this encounter.   No orders of the defined types were placed in this encounter.   New Prescriptions   No medications on file   Modified Medications   No medications on file    Planned  Follow Up No follow-ups on file.   Total Time in Face-to-Face and in Coordination of Care for patient including independent/personal interpretation/review of prior testing, medical history, examination, medication adjustment, communicating results with the patient directly,  and documentation within the EHR is ***.   Justice Britain, MD [facility] Office # [phone]

## 2022-04-15 NOTE — Patient Instructions (Signed)
If you have an episode of abdomen pain try to come into the labs within 6-12 hours of having pain. Labs have been entered.   Follow up as needed  If you are age 82 or older, your body mass index should be between 23-30. Your Body mass index is 20.14 kg/m. If this is out of the aforementioned range listed, please consider follow up with your Primary Care Provider.  If you are age 54 or younger, your body mass index should be between 19-25. Your Body mass index is 20.14 kg/m. If this is out of the aformentioned range listed, please consider follow up with your Primary Care Provider.   ________________________________________________________  The [facility] providers would like to encourage you to use [facility] to communicate with providers for non-urgent requests or questions.  Due to long hold times on the telephone, sending your provider a message by [facility] may be a faster and more efficient way to get a response.  Please allow 48 business hours for a response.  Please remember that this is for non-urgent requests.  _______________________________________________________  Thank you for choosing me and [facility].  Dr. Rush Landmark

## 2022-04-19 ENCOUNTER — Encounter: Payer: Self-pay | Admitting: Gastroenterology

## 2022-04-19 DIAGNOSIS — R109 Unspecified abdominal pain: Secondary | ICD-10-CM | POA: Insufficient documentation

## 2022-04-19 DIAGNOSIS — D649 Anemia, unspecified: Secondary | ICD-10-CM | POA: Insufficient documentation

## 2022-04-19 DIAGNOSIS — Z8719 Personal history of other diseases of the digestive system: Secondary | ICD-10-CM | POA: Insufficient documentation

## 2022-04-19 DIAGNOSIS — K31819 Angiodysplasia of stomach and duodenum without bleeding: Secondary | ICD-10-CM | POA: Insufficient documentation

## 2022-05-25 ENCOUNTER — Other Ambulatory Visit: Payer: Self-pay | Admitting: Gastroenterology

## 2022-06-19 DIAGNOSIS — I10 Essential (primary) hypertension: Secondary | ICD-10-CM | POA: Diagnosis not present

## 2022-06-19 DIAGNOSIS — I739 Peripheral vascular disease, unspecified: Secondary | ICD-10-CM | POA: Diagnosis not present

## 2022-06-19 DIAGNOSIS — J449 Chronic obstructive pulmonary disease, unspecified: Secondary | ICD-10-CM | POA: Diagnosis not present

## 2022-06-19 DIAGNOSIS — G629 Polyneuropathy, unspecified: Secondary | ICD-10-CM | POA: Diagnosis not present

## 2022-06-19 DIAGNOSIS — I779 Disorder of arteries and arterioles, unspecified: Secondary | ICD-10-CM | POA: Diagnosis not present

## 2022-06-19 DIAGNOSIS — F17210 Nicotine dependence, cigarettes, uncomplicated: Secondary | ICD-10-CM | POA: Diagnosis not present

## 2022-06-19 DIAGNOSIS — R011 Cardiac murmur, unspecified: Secondary | ICD-10-CM | POA: Diagnosis not present

## 2022-06-19 DIAGNOSIS — Q453 Other congenital malformations of pancreas and pancreatic duct: Secondary | ICD-10-CM | POA: Diagnosis not present

## 2022-06-23 ENCOUNTER — Encounter (INDEPENDENT_AMBULATORY_CARE_PROVIDER_SITE_OTHER): Payer: Medicare Other | Admitting: Ophthalmology

## 2022-07-14 DIAGNOSIS — Z1231 Encounter for screening mammogram for malignant neoplasm of breast: Secondary | ICD-10-CM | POA: Diagnosis not present

## 2022-07-23 ENCOUNTER — Encounter (INDEPENDENT_AMBULATORY_CARE_PROVIDER_SITE_OTHER): Payer: Medicare Other | Admitting: Ophthalmology

## 2022-07-23 DIAGNOSIS — H43813 Vitreous degeneration, bilateral: Secondary | ICD-10-CM

## 2022-07-23 DIAGNOSIS — I10 Essential (primary) hypertension: Secondary | ICD-10-CM | POA: Diagnosis not present

## 2022-07-23 DIAGNOSIS — H35033 Hypertensive retinopathy, bilateral: Secondary | ICD-10-CM

## 2022-07-23 DIAGNOSIS — D3132 Benign neoplasm of left choroid: Secondary | ICD-10-CM

## 2022-07-23 DIAGNOSIS — D3131 Benign neoplasm of right choroid: Secondary | ICD-10-CM | POA: Diagnosis not present

## 2022-08-04 DIAGNOSIS — D225 Melanocytic nevi of trunk: Secondary | ICD-10-CM | POA: Diagnosis not present

## 2022-08-04 DIAGNOSIS — D1801 Hemangioma of skin and subcutaneous tissue: Secondary | ICD-10-CM | POA: Diagnosis not present

## 2022-08-04 DIAGNOSIS — C44722 Squamous cell carcinoma of skin of right lower limb, including hip: Secondary | ICD-10-CM | POA: Diagnosis not present

## 2022-08-04 DIAGNOSIS — L820 Inflamed seborrheic keratosis: Secondary | ICD-10-CM | POA: Diagnosis not present

## 2022-08-04 DIAGNOSIS — Z85828 Personal history of other malignant neoplasm of skin: Secondary | ICD-10-CM | POA: Diagnosis not present

## 2022-08-04 DIAGNOSIS — L905 Scar conditions and fibrosis of skin: Secondary | ICD-10-CM | POA: Diagnosis not present

## 2022-08-04 DIAGNOSIS — L570 Actinic keratosis: Secondary | ICD-10-CM | POA: Diagnosis not present

## 2022-08-04 DIAGNOSIS — L821 Other seborrheic keratosis: Secondary | ICD-10-CM | POA: Diagnosis not present

## 2022-08-05 DIAGNOSIS — M4316 Spondylolisthesis, lumbar region: Secondary | ICD-10-CM | POA: Diagnosis not present

## 2022-08-30 DIAGNOSIS — Z23 Encounter for immunization: Secondary | ICD-10-CM | POA: Diagnosis not present

## 2022-09-15 DIAGNOSIS — Z85828 Personal history of other malignant neoplasm of skin: Secondary | ICD-10-CM | POA: Diagnosis not present

## 2022-09-15 DIAGNOSIS — C44722 Squamous cell carcinoma of skin of right lower limb, including hip: Secondary | ICD-10-CM | POA: Diagnosis not present

## 2022-09-22 DIAGNOSIS — M810 Age-related osteoporosis without current pathological fracture: Secondary | ICD-10-CM | POA: Diagnosis not present

## 2022-09-22 DIAGNOSIS — D649 Anemia, unspecified: Secondary | ICD-10-CM | POA: Diagnosis not present

## 2022-09-22 DIAGNOSIS — M5431 Sciatica, right side: Secondary | ICD-10-CM | POA: Diagnosis not present

## 2022-09-22 DIAGNOSIS — Q453 Other congenital malformations of pancreas and pancreatic duct: Secondary | ICD-10-CM | POA: Diagnosis not present

## 2022-09-22 DIAGNOSIS — I6523 Occlusion and stenosis of bilateral carotid arteries: Secondary | ICD-10-CM | POA: Diagnosis not present

## 2022-09-22 DIAGNOSIS — Z1331 Encounter for screening for depression: Secondary | ICD-10-CM | POA: Diagnosis not present

## 2022-09-22 DIAGNOSIS — Z0000 Encounter for general adult medical examination without abnormal findings: Secondary | ICD-10-CM | POA: Diagnosis not present

## 2022-09-22 DIAGNOSIS — I10 Essential (primary) hypertension: Secondary | ICD-10-CM | POA: Diagnosis not present

## 2022-09-22 DIAGNOSIS — F17210 Nicotine dependence, cigarettes, uncomplicated: Secondary | ICD-10-CM | POA: Diagnosis not present

## 2022-09-22 DIAGNOSIS — E782 Mixed hyperlipidemia: Secondary | ICD-10-CM | POA: Diagnosis not present

## 2022-09-22 DIAGNOSIS — J449 Chronic obstructive pulmonary disease, unspecified: Secondary | ICD-10-CM | POA: Diagnosis not present

## 2022-09-22 DIAGNOSIS — Z79899 Other long term (current) drug therapy: Secondary | ICD-10-CM | POA: Diagnosis not present

## 2022-09-22 DIAGNOSIS — I739 Peripheral vascular disease, unspecified: Secondary | ICD-10-CM | POA: Diagnosis not present

## 2022-09-22 DIAGNOSIS — I350 Nonrheumatic aortic (valve) stenosis: Secondary | ICD-10-CM | POA: Diagnosis not present

## 2022-10-01 DIAGNOSIS — Z85828 Personal history of other malignant neoplasm of skin: Secondary | ICD-10-CM | POA: Diagnosis not present

## 2022-10-01 DIAGNOSIS — C44321 Squamous cell carcinoma of skin of nose: Secondary | ICD-10-CM | POA: Diagnosis not present

## 2022-10-01 DIAGNOSIS — L570 Actinic keratosis: Secondary | ICD-10-CM | POA: Diagnosis not present

## 2022-10-02 DIAGNOSIS — I6523 Occlusion and stenosis of bilateral carotid arteries: Secondary | ICD-10-CM | POA: Diagnosis not present

## 2022-10-07 ENCOUNTER — Ambulatory Visit (HOSPITAL_COMMUNITY): Payer: Medicare Other | Attending: Cardiovascular Disease

## 2022-10-07 ENCOUNTER — Telehealth: Payer: Self-pay | Admitting: Pharmacy Technician

## 2022-10-07 ENCOUNTER — Other Ambulatory Visit: Payer: Self-pay

## 2022-10-07 DIAGNOSIS — I350 Nonrheumatic aortic (valve) stenosis: Secondary | ICD-10-CM | POA: Diagnosis not present

## 2022-10-07 DIAGNOSIS — M810 Age-related osteoporosis without current pathological fracture: Secondary | ICD-10-CM

## 2022-10-07 LAB — ECHOCARDIOGRAM COMPLETE
AR max vel: 1.12 cm2
AV Area VTI: 1.23 cm2
AV Area mean vel: 1.25 cm2
AV Mean grad: 13.3 mmHg
AV Peak grad: 27.5 mmHg
Ao pk vel: 2.62 m/s
Area-P 1/2: 1.57 cm2
P 1/2 time: 441 msec
S' Lateral: 2.7 cm

## 2022-10-07 NOTE — Telephone Encounter (Signed)
Auth Submission: NO AUTH NEEDED Payer: medicare a/b & aarp Medication & CPT/J Code(s) submitted: Reclast (Zolendronic acid) [id number] Route of submission (phone, fax, portal):  Phone # Fax # Auth type: Buy/Bill Units/visits requested: x1 Reference number:  Approval from: 10/07/22 to 10/08/23

## 2022-10-09 ENCOUNTER — Telehealth: Payer: Self-pay | Admitting: Cardiovascular Disease

## 2022-10-09 NOTE — Telephone Encounter (Signed)
Patient is returning call for her echo results.

## 2022-10-09 NOTE — Telephone Encounter (Signed)
Patient made aware-see echo results.

## 2022-10-14 ENCOUNTER — Encounter: Payer: Self-pay | Admitting: Cardiovascular Disease

## 2022-10-14 ENCOUNTER — Ambulatory Visit: Payer: Medicare Other | Attending: Cardiovascular Disease | Admitting: Cardiovascular Disease

## 2022-10-14 VITALS — BP 130/62 | HR 73 | Ht 60.0 in | Wt 109.0 lb

## 2022-10-14 DIAGNOSIS — E785 Hyperlipidemia, unspecified: Secondary | ICD-10-CM | POA: Diagnosis not present

## 2022-10-14 DIAGNOSIS — I350 Nonrheumatic aortic (valve) stenosis: Secondary | ICD-10-CM | POA: Insufficient documentation

## 2022-10-14 DIAGNOSIS — I10 Essential (primary) hypertension: Secondary | ICD-10-CM | POA: Insufficient documentation

## 2022-10-14 DIAGNOSIS — Z720 Tobacco use: Secondary | ICD-10-CM | POA: Diagnosis not present

## 2022-10-14 MED ORDER — ROSUVASTATIN CALCIUM 40 MG PO TABS: 40.0000 mg | ORAL_TABLET | Freq: Every day | ORAL | 3 refills | Status: DC

## 2022-10-14 NOTE — Patient Instructions (Signed)
Medication Instructions:  STOP the Simvastatin  START Rosuvastatin 40 mg once daily *If you need a refill on your cardiac medications before your next appointment, please call your pharmacy*   Lab Work: Labs at PCP If you have labs (blood work) drawn today and your tests are completely normal, you will receive your results only by: [place] (if you have MyChart) OR A paper copy in the mail If you have any lab test that is abnormal or we need to change your treatment, we will call you to review the results.   Testing/Procedures: Your physician has requested that you have an echocardiogram in one year. Echocardiography is a painless test that uses sound waves to create images of your heart. It provides your doctor with information about the size and shape of your heart and how well your heart's chambers and valves are working. You may receive an ultrasound enhancing agent through an IV if needed to better visualize your heart during the echo.This procedure takes approximately one hour. There are no restrictions for this procedure. This will take place at the [address], [address].     Follow-Up: At [facility], you and your health needs are our priority.  As part of our continuing mission to provide you with exceptional heart care, we have created designated Provider Care Teams.  These Care Teams include your primary Cardiologist (physician) and Advanced Practice Providers (APPs -  Physician Assistants and Nurse Practitioners) who all work together to provide you with the care you need, when you need it.  We recommend signing up for the patient portal called "MyChart".  Sign up information is provided on this After Visit Summary.  MyChart is used to connect with patients for Virtual Visits (Telemedicine).  Patients are able to view lab/test results, encounter notes, upcoming appointments, etc.  Non-urgent messages can be sent to your provider as well.   To learn more  about what you can do with MyChart, go to [email or web address].    Your next appointment:   12 month(s)  The format for your next appointment:   In Person  Provider:   Dr. Fletcher Anon

## 2022-10-14 NOTE — Progress Notes (Signed)
Cardiology Office Note   Date:  10/14/2022   ID:  Elizabeth [name], DOB 10/29/1941, MRN [id number]  PCP:  [name], MD  Cardiologist:   Kathlyn Sacramento, MD   No chief complaint on file.      History of Present Illness: Elizabeth [name] is a 81 y.o. female who is here today for follow-up visit regarding aortic stenosis.   She has multiple chronic medical conditions including COPD, essential hypertension, hyperlipidemia, prolonged history of tobacco use and history of pancreatitis.  She has strong family history of cardiovascular disease.  She had back surgery earlier this year without complications.  She had severe claudication last year.  She was seen for preoperative cardiovascular evaluation before aortobifemoral bypass.  She had an echocardiogram done which showed normal LV systolic function with mild mitral stenosis and moderate aortic stenosis.  Lexiscan Myoview showed no evidence of ischemia. She underwent aortobifemoral bypass in January.  She was hospitalized in February with pancreatitis. She underwent follow-up echocardiogram recently which showed moderate mitral stenosis, moderate aortic regurgitation and mild to moderate aortic stenosis.  She has been doing reasonably well and denies claudication but does complain of neuropathic pain.  No chest pain or worsening dyspnea.  She is not able to quit smoking.  Past Medical History:  Diagnosis Date   Abdominal cramping    possibly irritable bowel syndrome versus adhesions secondary to multiple surgeries and abdominal inflammation and 2009 secondary to diverticulitis and diverticular phlegmon   Allergic rhinitis    Arthritis    Carotid artery disease (HCC)    54-62% RICA, < 70% LICA 35/0/09 [name] ([facility])   Cataract    Chest pain syndrome 2004   with adenosine Cardiolite that was normal   Chronic hoarseness    COPD (chronic obstructive pulmonary disease) (HCC)    Dyspnea    GERD (gastroesophageal reflux disease)    Heart  murmur    Mild AS, AI. AV pk grad 18.79 mmHg, mn grad 11.20 mmHg, AVA (VTI) 1.05 cm2 10/10/20, 3 year f/u rec ([facility])   History of kidney stones    History of renal calculi    Hypercholesterolemia    Hypertension    Incontinence    Cough incontinence   Mild depression    Neuromuscular disorder (HCC)    neuropathy bilateral lower extremities   Osteopenia    Peripheral vascular disease ([place])    with femoral and carotid bruits   PONV (postoperative nausea and vomiting)    Recurrent pancreatitis    Tobacco dependence    Vitamin D deficiency     Past Surgical History:  Procedure Laterality Date   ABDOMINAL AORTOGRAM W/LOWER EXTREMITY N/A 09/18/2021   Procedure: ABDOMINAL AORTOGRAM W/LOWER EXTREMITY;  Surgeon: [name], MD;  Location: [place] [facility];  Service: Cardiovascular;  Laterality: N/A;   AORTA - BILATERAL FEMORAL ARTERY BYPASS GRAFT Bilateral 12/03/2021   Procedure: AORTOBIFEMORAL BYPASS GRAFT USING A 14 X 47m HEMASHIELD GOLD GRAFT;  Surgeon: RBroadus John MD;  Location[facility]  Service: Vascular;  Laterality: Bilateral;   APPENDECTOMY     BIOPSY  02/24/2022   Procedure: BIOPSY;  Surgeon: MIrving Copas, MD;  [facility];  Service: Gastroenterology;;   BREAST BIOPSY     CATARACT EXTRACTION, BILATERAL     COLON SURGERY     2009   COSMETIC SURGERY     on neck and eyes   DILATION AND CURETTAGE OF UTERUS  ESOPHAGOGASTRODUODENOSCOPY (EGD) WITH PROPOFOL N/A 02/24/2022   Procedure: ESOPHAGOGASTRODUODENOSCOPY (EGD) WITH PROPOFOL;  Surgeon: Rush Landmark Telford Nab., MD;  [facility];  Service: Gastroenterology;  Laterality: N/A;   EUS N/A 02/24/2022   Procedure: UPPER ENDOSCOPIC ULTRASOUND (EUS) RADIAL;  Surgeon: Irving Copas., MD;  Location: [facility];  Service: Gastroenterology;  Laterality: N/A;   EYE SURGERY     2014   FEMUR SURGERY  12/2007   left femur surgery--for femoral neck fracture status post fall    FRACTURE  SURGERY     broke left leg and left arm- 2012   HOT HEMOSTASIS N/A 02/24/2022   Procedure: HOT HEMOSTASIS (ARGON PLASMA COAGULATION/BICAP);  Surgeon: Irving Copas., MD;  Location[facility];  Service: Gastroenterology;  Laterality: N/A;   LIPOSUCTION     LUMBAR LAMINECTOMY/DECOMPRESSION MICRODISCECTOMY N/A 11/12/2020   Procedure: LAMINECTOMY AND FORAMINOTOMY Lumbar three four, Lumbar four five, Lumbar Five Sacral one;  Surgeon: Newman Pies, MD;  Location: [place];  Service: Neurosurgery;  Laterality: N/A;   OTHER SURGICAL HISTORY     sigmoid colectomy   SHOULDER SURGERY     arthroscopic shoulder surgery    TONSILLECTOMY     TUBAL LIGATION       Current Outpatient Medications  Medication Sig Dispense Refill   aspirin EC 81 MG tablet Take 81 mg by mouth at bedtime. Swallow whole.     b complex vitamins capsule Take 1 capsule by mouth daily.     cloNIDine (CATAPRES) 0.1 MG tablet Take 0.2 mg by mouth at bedtime.     Coenzyme Q10 100 MG capsule Take 100 mg by mouth every morning.     feeding supplement (ENSURE ENLIVE / ENSURE PLUS) LIQD Take 237 mLs by mouth 3 (three) times daily between meals. 237 mL 12   INCRUSE ELLIPTA 62.5 MCG/INH AEPB Inhale 1 puff into the lungs daily. 30 each 1   irbesartan-hydrochlorothiazide (AVALIDE) 150-12.5 MG tablet Take 1 tablet by mouth daily.     Menthol, Topical Analgesic, (ICY HOT EX) Apply 1 application topically daily as needed (pain).     metoprolol succinate (TOPROL-XL) 25 MG 24 hr tablet Take 25 mg by mouth at bedtime.     Multiple Vitamin (MULTIVITAMIN WITH MINERALS) TABS tablet Take 1 tablet by mouth 2 (two) times daily.     pantoprazole (PROTONIX) 20 MG tablet TAKE 1 TABLET BY MOUTH EVERY DAY 90 tablet 2   simvastatin (ZOCOR) 40 MG tablet Take 40 mg by mouth at bedtime.     TURMERIC CURCUMIN PO Take 1 capsule by mouth daily. With Ginger     VITAMIN D PO Take 1 capsule by mouth daily.     VITAMIN E PO Take 1 capsule by mouth daily.      gabapentin (NEURONTIN) 100 MG capsule Take 2 capsules (200 mg total) by mouth at bedtime. PATIENT NEEDS OFFICE VISIT FOR ADDITIONAL REFILLS (Patient taking differently: Take 100-200 mg by mouth See admin instructions. Take one capsule (100 mg) by mouth every morning and two capsules (200 mg) at night) 60 capsule 0   No current facility-administered medications for this visit.    Allergies:   Alendronate sodium, Azithromycin, Chantix [varenicline], Other, Pollen extract, Tegretol [carbamazepine], Lisinopril, and Tetracyclines & related    Social History:  The patient  reports that she has been smoking cigarettes. She has a 60.00 pack-year smoking history. She has never been exposed to tobacco smoke. She has never used smokeless tobacco. She reports current alcohol use. She reports  that she does not use drugs.   Family History:  The patient's family history includes Heart disease in her mother; Stroke in her mother.    ROS:  Please see the history of present illness.   Otherwise, review of systems are positive for none.   All other systems are reviewed and negative.    PHYSICAL EXAM: VS:  BP 130/62 (BP Location: Left Arm, Patient Position: Sitting, Cuff Size: Normal)   Pulse 73   Ht 5' (1.524 m)   Wt 109 lb (49.4 kg)   SpO2 99%   BMI 21.29 kg/m  , BMI Body mass index is 21.29 kg/m. GEN: Well nourished, well developed, in no acute distress  HEENT: normal  Neck: no JVD,  or masses.  Right carotid bruit Cardiac: RRR; no  rubs, or gallops,no edema .  3 out of 6 crescendo decrescendo systolic murmur in the aortic area which is mid peaking with diminished S2.  The murmur radiates to both carotid arteries. Respiratory:  clear to auscultation bilaterally, normal work of breathing GI: soft, nontender, nondistended, + BS MS: no deformity or atrophy  Skin: warm and dry, no rash Neuro:  Strength and sensation are intact Psych: euthymic mood, full affect   EKG:  EKG is ordered today. The  ekg ordered today demonstrates normal sinus rhythm with biatrial enlargement.   Recent Labs: 12/17/2021: Magnesium 1.6 12/18/2021: ALT 12; BUN 8; Creatinine, Ser 0.75; Potassium 3.8; Sodium 138 02/28/2022: Hemoglobin 12.4; Platelets 215.0    Lipid Panel    Component Value Date/Time   TRIG 118 12/11/2021 2310      Wt Readings from Last 3 Encounters:  10/14/22 109 lb (49.4 kg)  04/15/22 103 lb 2 oz (46.8 kg)  02/24/22 100 lb (45.4 kg)            No data to display            ASSESSMENT AND PLAN:   1.  Aortic stenosis: Mild to moderate on most recent echocardiogram with moderate regurgitation.  Repeat echocardiogram in 1 year.  2.  Peripheral arterial disease: Status post aortobifemoral bypass: Minimal claudication at this time.  Followed by vascular surgery.  3.  Essential hypertension: Blood pressure is controlled on current medications.  4.  Hyperlipidemia: She is currently on simvastatin 40 mg once daily.  I reviewed her most recent lipid profile in November which showed an LDL of 107.  Given her extensive vascular disease, recommend an LDL below 70.  Thus, I elected to switch her from simvastatin to rosuvastatin 40 mg daily.  She is scheduled to have labs done with her primary care physician in February.  5.  Tobacco use: The patient knows the importance of smoking cessation but reports inability to quit.    Disposition:   Follow-up with me in a year after an echocardiogram.  Signed,  Kathlyn Sacramento, MD  10/14/2022 11:16 AM    [place]

## 2022-10-15 DIAGNOSIS — Z85828 Personal history of other malignant neoplasm of skin: Secondary | ICD-10-CM | POA: Diagnosis not present

## 2022-10-15 DIAGNOSIS — C44321 Squamous cell carcinoma of skin of nose: Secondary | ICD-10-CM | POA: Diagnosis not present

## 2022-11-14 DIAGNOSIS — J449 Chronic obstructive pulmonary disease, unspecified: Secondary | ICD-10-CM | POA: Diagnosis not present

## 2022-11-14 DIAGNOSIS — Z79899 Other long term (current) drug therapy: Secondary | ICD-10-CM | POA: Diagnosis not present

## 2022-11-14 DIAGNOSIS — J0100 Acute maxillary sinusitis, unspecified: Secondary | ICD-10-CM | POA: Diagnosis not present

## 2022-11-18 ENCOUNTER — Ambulatory Visit (INDEPENDENT_AMBULATORY_CARE_PROVIDER_SITE_OTHER): Payer: Medicare Other

## 2022-11-18 VITALS — BP 107/56 | HR 80 | Temp 97.7°F | Resp 20 | Ht 60.0 in | Wt 108.0 lb

## 2022-11-18 DIAGNOSIS — M810 Age-related osteoporosis without current pathological fracture: Secondary | ICD-10-CM

## 2022-11-18 MED ORDER — ZOLEDRONIC ACID 5 MG/100ML IV SOLN
5.0000 mg | Freq: Once | INTRAVENOUS | Status: AC
  Administered 2022-11-18: 5 mg via INTRAVENOUS
  Filled 2022-11-18: qty 100

## 2022-11-18 MED ORDER — DIPHENHYDRAMINE HCL 25 MG PO CAPS
25.0000 mg | ORAL_CAPSULE | Freq: Once | ORAL | Status: AC
  Administered 2022-11-18: 25 mg via ORAL
  Filled 2022-11-18: qty 1

## 2022-11-18 MED ORDER — ACETAMINOPHEN 325 MG PO TABS
650.0000 mg | ORAL_TABLET | Freq: Once | ORAL | Status: AC
  Administered 2022-11-18: 650 mg via ORAL
  Filled 2022-11-18: qty 2

## 2022-11-18 MED ORDER — SODIUM CHLORIDE 0.9 % IV SOLN: INTRAVENOUS | Status: DC

## 2022-11-18 NOTE — Patient Instructions (Signed)

## 2022-11-18 NOTE — Progress Notes (Signed)
Diagnosis: Osteoporosis  Provider:  Marshell Garfinkel MD  Procedure: Infusion  IV Type: Peripheral, IV Location: R Antecubital  Reclast (Zolendronic Acid), Dose: 5 mg  Infusion Start Time: 1116  Infusion Stop Time: 1146  Post Infusion IV Care: Patient declined observation and Peripheral IV Discontinued  Discharge: Condition: Good, Destination: Home . AVS provided to patient.   Performed by:  Arnoldo Morale, RN

## 2022-12-04 ENCOUNTER — Encounter: Payer: Self-pay | Admitting: Emergency Medicine

## 2022-12-04 ENCOUNTER — Ambulatory Visit
Admission: EM | Admit: 2022-12-04 | Discharge: 2022-12-04 | Disposition: A | Discharge status: Home / Self Care | Payer: Medicare Other | Attending: Physician Assistant | Admitting: Physician Assistant

## 2022-12-04 DIAGNOSIS — J0110 Acute frontal sinusitis, unspecified: Secondary | ICD-10-CM | POA: Diagnosis not present

## 2022-12-04 MED ORDER — AMOXICILLIN-POT CLAVULANATE 500-125 MG PO TABS: 1.0000 | ORAL_TABLET | Freq: Two times a day (BID) | ORAL | 0 refills | Status: DC

## 2022-12-04 MED ORDER — PREDNISONE 10 MG PO TABS: 10.0000 mg | ORAL_TABLET | Freq: Three times a day (TID) | ORAL | 0 refills | Status: DC

## 2022-12-04 MED ORDER — FLUTICASONE PROPIONATE 50 MCG/ACT NA SUSP: 2.0000 | Freq: Every day | NASAL | 0 refills | Status: DC

## 2022-12-04 NOTE — ED Provider Notes (Signed)
[facility]    CSN: [id number] Arrival date & time: 12/04/22  1546      History   Chief Complaint Chief Complaint  Patient presents with   Sinus Infection    HPI Elizabeth [name] is a 82 y.o. female.   82 year old female presents with sinus pressure and pain.  Patient indicates for the past month she has been having frontal and maxillary sinus pressure, pain, and tenderness.  Patient indicates that she was seen by her PCP and was placed on antibiotic which she took for 5 days.  Patient indicates she finished this on January 17.  She indicates that she continues to have frontal and maxillary sinus pressure, pain, tenderness.  She indicates she has been having production which is discolored yellow-greenish.  She indicates that she has been having fatigue and just does not feel well.  She indicates she believes that she continues to have a sinus infection and that it has not resolved yet as her symptoms continue.  She is using some OTC medication to help with congestion which has not given any relief.  Denies any fever or chills     Past Medical History:  Diagnosis Date   Abdominal cramping    possibly irritable bowel syndrome versus adhesions secondary to multiple surgeries and abdominal inflammation and 2009 secondary to diverticulitis and diverticular phlegmon   Allergic rhinitis    Arthritis    Carotid artery disease (HCC)    71-24% RICA, < 58% LICA 07/12/82 [name] ([facility])   Cataract    Chest pain syndrome 2004   with adenosine Cardiolite that was normal   Chronic hoarseness    COPD (chronic obstructive pulmonary disease) (HCC)    Dyspnea    GERD (gastroesophageal reflux disease)    Heart murmur    Mild AS, AI. AV pk grad 18.79 mmHg, mn grad 11.20 mmHg, AVA (VTI) 1.05 cm2 10/10/20, 3 year f/u rec ([facility])   History of kidney stones    History of renal calculi    Hypercholesterolemia    Hypertension    Incontinence    Cough incontinence   Mild depression     Neuromuscular disorder (HCC)    neuropathy bilateral lower extremities   Osteopenia    Peripheral vascular disease (HCC)    with femoral and carotid bruits   PONV (postoperative nausea and vomiting)    Recurrent pancreatitis    Tobacco dependence    Vitamin D deficiency     Patient Active Problem List   Diagnosis Date Noted   Senile osteoporosis 10/07/2022   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   Recurrent abdominal pain 04/19/2022   History of pancreatitis 04/19/2022   Anemia 04/19/2022   Allergic rhinitis 01/03/2022   Amnesia 01/03/2022   Aortic valve insufficiency 01/03/2022   Cardiac murmur, unspecified 01/03/2022   Cervical radiculopathy 01/03/2022   Chronic fatigue, unspecified 01/03/2022   Disorder of respiratory system 01/03/2022   Dyspepsia 01/03/2022   Hypersomnia 01/03/2022   Occlusion and stenosis of bilateral carotid arteries 01/03/2022   Osteochondropathy 01/03/2022   Colon cancer screening 01/03/2022   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   Paresthesia of skin 01/03/2022   Peripheral neuropathic pain 01/03/2022   Pure hypercholesterolemia 01/03/2022   Sciatica 01/03/2022   Tobacco user 01/03/2022   Ventricular premature depolarization 01/03/2022   Vitamin D deficiency 01/03/2022   Weight loss 01/03/2022   Tendinitis of shoulder 01/01/2022   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   Localized, primary osteoarthritis of shoulder region 01/01/2022   Numbness of  lower limb 01/01/2022   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   Shoulder joint pain 01/01/2022   Abdominal fluid collection 12/12/2021   Elevated troponin level not due myocardial infarction 12/12/2021   PVD (peripheral vascular disease) ([place]) 12/03/2021   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   Community acquired pneumonia 04/04/2021   Hyponatremia 04/04/2021   Gastro-esophageal reflux disease without esophagitis 04/04/2021   Mixed hyperlipidemia 04/04/2021   Pain in right foot 01/10/2021   Acute pancreatitis 11/17/2020    COPD (chronic obstructive pulmonary disease) ([place])    Essential hypertension    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    Past Surgical History:  Procedure Laterality Date   ABDOMINAL AORTOGRAM W/LOWER EXTREMITY N/A 09/18/2021   Procedure: ABDOMINAL AORTOGRAM W/LOWER EXTREMITY;  Surgeon: [name], MD;  Location: [facility];  Service: Cardiovascular;  Laterality: N/A;   AORTA - BILATERAL FEMORAL ARTERY BYPASS GRAFT Bilateral 12/03/2021   Procedure: AORTOBIFEMORAL BYPASS GRAFT USING A 14 X 53m HEMASHIELD GOLD GRAFT;  Surgeon: RBroadus John MD;  Location: [facility]  Service: Vascular;  Laterality: Bilateral;   APPENDECTOMY     BIOPSY  02/24/2022   Procedure: BIOPSY;  Surgeon: MIrving Copas, MD;  Location: [facility];  Service: Gastroenterology;;   BREAST BIOPSY     CATARACT EXTRACTION, BILATERAL     COLON SURGERY     2009   COSMETIC SURGERY     on neck and eyes   DILATION AND CURETTAGE OF UTERUS     ESOPHAGOGASTRODUODENOSCOPY (EGD) WITH PROPOFOL N/A 02/24/2022   Procedure: ESOPHAGOGASTRODUODENOSCOPY (EGD) WITH PROPOFOL;  Surgeon: MIrving Copas, MD;  [facility];  Service: Gastroenterology;  Laterality: N/A;   EUS N/A 02/24/2022   Procedure: UPPER ENDOSCOPIC ULTRASOUND (EUS) RADIAL;  Surgeon: MIrving Copas, MD;  Location: [facility];  Service: Gastroenterology;  Laterality: N/A;   EYE SURGERY     2014   FEMUR SURGERY  12/2007   left femur surgery--for femoral neck fracture status post fall    FRACTURE SURGERY     broke left leg and left arm- 2012   HOT HEMOSTASIS N/A 02/24/2022   Procedure: HOT HEMOSTASIS (ARGON PLASMA COAGULATION/BICAP);  Surgeon: MIrving Copas, MD;  [facility];  Service: Gastroenterology;  Laterality: N/A;   LIPOSUCTION     LUMBAR LAMINECTOMY/DECOMPRESSION MICRODISCECTOMY N/A 11/12/2020   Procedure:  LAMINECTOMY AND FORAMINOTOMY Lumbar three four, Lumbar four five, Lumbar Five Sacral one;  Surgeon: JNewman Pies MD;  Location[facility]  Service: Neurosurgery;  Laterality: N/A;   OTHER SURGICAL HISTORY     sigmoid colectomy   SHOULDER SURGERY     arthroscopic shoulder surgery    TONSILLECTOMY     TUBAL LIGATION      OB History   No obstetric history on file.      Home Medications    Prior to Admission medications   Medication Sig Start Date End Date Taking? Authorizing Provider  amoxicillin-clavulanate (AUGMENTIN) 500-125 MG tablet Take 1 tablet by mouth 2 (two) times daily. 12/04/22  Yes JNyoka Lint PA-C  aspirin EC 81 MG tablet Take 81 mg by mouth at bedtime. Swallow whole.   Yes [provider]  b complex vitamins capsule Take 1 capsule by mouth daily.   Yes [provider]  cloNIDine (CATAPRES) 0.1 MG tablet Take 0.2 mg by mouth at bedtime.   Yes [provider]  Coenzyme Q10 100 MG capsule Take 100 mg  by mouth every morning.   Yes [provider]  feeding supplement (ENSURE ENLIVE / ENSURE PLUS) LIQD Take 237 mLs by mouth 3 (three) times daily between meals. 12/18/21  Yes Nolberto Hanlon, MD  fluticasone (FLONASE) 50 MCG/ACT nasal spray Place 2 sprays into both nostrils daily. 12/04/22  Yes Nyoka Lint, PA-C  gabapentin (NEURONTIN) 100 MG capsule Take 2 capsules (200 mg total) by mouth at bedtime. PATIENT NEEDS OFFICE VISIT FOR ADDITIONAL REFILLS Patient taking differently: Take 100-200 mg by mouth See admin instructions. Take one capsule (100 mg) by mouth every morning and two capsules (200 mg) at night 07/30/15  Yes Daub, Loura Back, MD  INCRUSE ELLIPTA 62.5 MCG/INH AEPB Inhale 1 puff into the lungs daily. 11/21/20  Yes Rai, Ripudeep K, MD  irbesartan-hydrochlorothiazide (AVALIDE) 150-12.5 MG tablet Take 1 tablet by mouth daily.   Yes [provider]  Menthol, Topical Analgesic, (ICY HOT EX) Apply 1 application topically daily as needed  (pain).   Yes [provider]  metoprolol succinate (TOPROL-XL) 25 MG 24 hr tablet Take 25 mg by mouth at bedtime.   Yes [provider]  Multiple Vitamin (MULTIVITAMIN WITH MINERALS) TABS tablet Take 1 tablet by mouth 2 (two) times daily.   Yes [provider]  pantoprazole (PROTONIX) 20 MG tablet TAKE 1 TABLET BY MOUTH EVERY DAY 05/26/22  Yes Mansouraty, Telford Nab., MD  predniSONE (DELTASONE) 10 MG tablet Take 1 tablet (10 mg total) by mouth in the morning, at noon, and at bedtime. 12/04/22  Yes Nyoka Lint, PA-C  rosuvastatin (CRESTOR) 40 MG tablet Take 1 tablet (40 mg total) by mouth daily. 10/14/22 10/09/23 Yes Wellington Hampshire, MD  TURMERIC CURCUMIN PO Take 1 capsule by mouth daily. With Ginger   Yes [provider]  VITAMIN D PO Take 1 capsule by mouth daily.   Yes [provider]  VITAMIN E PO Take 1 capsule by mouth daily.   Yes [provider]    Family History Family History  Problem Relation Age of Onset   Heart disease Mother    Stroke Mother    Colon cancer Neg Hx    Esophageal cancer Neg Hx    Stomach cancer Neg Hx    Inflammatory bowel disease Neg Hx    Liver disease Neg Hx    Pancreatic cancer Neg Hx    Rectal cancer Neg Hx     Social History Social History   Tobacco Use   Smoking status: Some Days    Packs/day: 1.00    Years: 60.00    Total pack years: 60.00    Types: Cigarettes    Passive exposure: Never   Smokeless tobacco: Never  Vaping Use   Vaping Use: Never used  Substance Use Topics   Alcohol use: Yes    Alcohol/week: 0.0 standard drinks of alcohol    Comment: Occ glass of wine   Drug use: No     Allergies   Alendronate sodium, Azithromycin, Chantix [varenicline], Other, Pollen extract, Tegretol [carbamazepine], Lisinopril, and Tetracyclines & related   Review of Systems Review of Systems  HENT:  Positive for postnasal drip, rhinorrhea, sinus pressure and sinus pain.      Physical  Exam Triage Vital Signs ED Triage Vitals  Enc Vitals Group     BP 12/04/22 1611 (!) 127/57     Pulse Rate 12/04/22 1611 70     Resp 12/04/22 1611 16     Temp 12/04/22 1611 97.8 F (36.6 C)  Temp Source 12/04/22 1611 Oral     SpO2 12/04/22 1611 95 %     Weight 12/04/22 1613 106 lb (48.1 kg)     Height 12/04/22 1613 5' (1.524 m)     Head Circumference --      Peak Flow --      Pain Score 12/04/22 1613 7     Pain Loc --      Pain Edu? --      Excl. in [place]? --    No data found.  Updated Vital Signs BP (!) 127/57 (BP Location: Left Arm)   Pulse 70   Temp 97.8 F (36.6 C) (Oral)   Resp 16   Ht 5' (1.524 m)   Wt 106 lb (48.1 kg)   SpO2 95%   BMI 20.70 kg/m   Visual Acuity Right Eye Distance:   Left Eye Distance:   Bilateral Distance:    Right Eye Near:   Left Eye Near:    Bilateral Near:     Physical Exam Constitutional:      Appearance: Normal appearance.  HENT:     Right Ear: Ear canal normal. Tympanic membrane is injected.     Left Ear: Ear canal normal. Tympanic membrane is injected.     Mouth/Throat:     Mouth: Mucous membranes are moist.     Pharynx: Oropharynx is clear.     Comments: Face: Tenderness is palpated on maxillary and frontal sinuses. Cardiovascular:     Rate and Rhythm: Normal rate and regular rhythm.     Heart sounds: Normal heart sounds.  Pulmonary:     Effort: Pulmonary effort is normal.     Breath sounds: Normal breath sounds and air entry. No wheezing, rhonchi or rales.  Lymphadenopathy:     Cervical: No cervical adenopathy.  Neurological:     Mental Status: She is alert.      UC Treatments / Results  Labs (all labs ordered are listed, but only abnormal results are displayed) Labs Reviewed - No data to display  EKG   Radiology No results found.  Procedures Procedures (including critical care time)  Medications Ordered in UC Medications - No data to display  Initial Impression / Assessment and Plan / UC Course  I  have reviewed the triage vital signs and the nursing notes.  Pertinent labs & imaging results that were available during my care of the patient were reviewed by me and considered in my medical decision making (see chart for details).    Plan: The diagnosis will be treated with the following: 1.  Frontal sinusitis: A.  Augmentin 500 mg every 12 hours with food to treat infection. B.  Prednisone 10 mg 3 times a day for 3 days only. C.  Flonase nasal spray, 2 sprays each nostril once daily to decrease sinus congestion. D.  Advised take Tylenol or ibuprofen as needed for pain and discomfort. 2.  Advised follow-up PCP or return to urgent care if symptoms fail to improve. Final Clinical Impressions(s) / UC Diagnoses   Final diagnoses:  Acute non-recurrent frontal sinusitis     Discharge Instructions      Advised to use the Flonase nasal spray, 2 sprays each nostril once daily to help decrease sinus congestion and discomfort. Advised take prednisone 10 mg every 8 hours until completed as this will help decrease the sinus congestion. Advised take the Augmentin every 12 hours with food to treat infection.  Advised follow-up PCP or return to urgent care if  symptoms fail to improve.    ED Prescriptions     Medication Sig Dispense Auth. Provider   amoxicillin-clavulanate (AUGMENTIN) 500-125 MG tablet Take 1 tablet by mouth 2 (two) times daily. 14 tablet Nyoka Lint, PA-C   predniSONE (DELTASONE) 10 MG tablet Take 1 tablet (10 mg total) by mouth in the morning, at noon, and at bedtime. 10 tablet Nyoka Lint, PA-C   fluticasone [facility]) 50 MCG/ACT nasal spray Place 2 sprays into both nostrils daily. 9.9 mL Nyoka Lint, PA-C      PDMP not reviewed this encounter.   Nyoka Lint, PA-C 12/04/22 [phone]

## 2022-12-04 NOTE — Discharge Instructions (Signed)
Advised to use the Flonase nasal spray, 2 sprays each nostril once daily to help decrease sinus congestion and discomfort. Advised take prednisone 10 mg every 8 hours until completed as this will help decrease the sinus congestion. Advised take the Augmentin every 12 hours with food to treat infection.  Advised follow-up PCP or return to urgent care if symptoms fail to improve.

## 2022-12-04 NOTE — ED Triage Notes (Signed)
Patient c/o possible sinus infection, head pressure and congestion on and off x 1 month.  Patient did see her PCP and was given Cefuroxime Axetil '500mg'$  which it improved now it's back.

## 2022-12-15 ENCOUNTER — Inpatient Hospital Stay (HOSPITAL_COMMUNITY)
Admission: EM | Admit: 2022-12-15 | Discharge: 2022-12-17 | DRG: 683 | Disposition: A | Discharge status: Home Health | Payer: Medicare Other | Attending: Internal Medicine | Admitting: Internal Medicine

## 2022-12-15 ENCOUNTER — Other Ambulatory Visit: Payer: Self-pay

## 2022-12-15 ENCOUNTER — Observation Stay (HOSPITAL_COMMUNITY): Payer: Medicare Other

## 2022-12-15 ENCOUNTER — Emergency Department (HOSPITAL_COMMUNITY): Payer: Medicare Other

## 2022-12-15 ENCOUNTER — Encounter (HOSPITAL_COMMUNITY): Payer: Self-pay

## 2022-12-15 DIAGNOSIS — R531 Weakness: Secondary | ICD-10-CM

## 2022-12-15 DIAGNOSIS — R5381 Other malaise: Secondary | ICD-10-CM | POA: Diagnosis present

## 2022-12-15 DIAGNOSIS — I700 Atherosclerosis of aorta: Secondary | ICD-10-CM | POA: Diagnosis not present

## 2022-12-15 DIAGNOSIS — Z7982 Long term (current) use of aspirin: Secondary | ICD-10-CM

## 2022-12-15 DIAGNOSIS — E860 Dehydration: Secondary | ICD-10-CM

## 2022-12-15 DIAGNOSIS — Z8249 Family history of ischemic heart disease and other diseases of the circulatory system: Secondary | ICD-10-CM

## 2022-12-15 DIAGNOSIS — G629 Polyneuropathy, unspecified: Secondary | ICD-10-CM | POA: Diagnosis present

## 2022-12-15 DIAGNOSIS — J439 Emphysema, unspecified: Secondary | ICD-10-CM | POA: Diagnosis not present

## 2022-12-15 DIAGNOSIS — E876 Hypokalemia: Secondary | ICD-10-CM

## 2022-12-15 DIAGNOSIS — Z7952 Long term (current) use of systemic steroids: Secondary | ICD-10-CM | POA: Diagnosis not present

## 2022-12-15 DIAGNOSIS — I959 Hypotension, unspecified: Secondary | ICD-10-CM | POA: Diagnosis present

## 2022-12-15 DIAGNOSIS — R109 Unspecified abdominal pain: Secondary | ICD-10-CM | POA: Diagnosis not present

## 2022-12-15 DIAGNOSIS — I739 Peripheral vascular disease, unspecified: Secondary | ICD-10-CM | POA: Diagnosis present

## 2022-12-15 DIAGNOSIS — E8809 Other disorders of plasma-protein metabolism, not elsewhere classified: Secondary | ICD-10-CM | POA: Diagnosis present

## 2022-12-15 DIAGNOSIS — Z1152 Encounter for screening for COVID-19: Secondary | ICD-10-CM | POA: Diagnosis not present

## 2022-12-15 DIAGNOSIS — E559 Vitamin D deficiency, unspecified: Secondary | ICD-10-CM | POA: Diagnosis present

## 2022-12-15 DIAGNOSIS — E7800 Pure hypercholesterolemia, unspecified: Secondary | ICD-10-CM | POA: Diagnosis present

## 2022-12-15 DIAGNOSIS — N179 Acute kidney failure, unspecified: Secondary | ICD-10-CM | POA: Diagnosis not present

## 2022-12-15 DIAGNOSIS — K219 Gastro-esophageal reflux disease without esophagitis: Secondary | ICD-10-CM | POA: Diagnosis present

## 2022-12-15 DIAGNOSIS — Z87891 Personal history of nicotine dependence: Secondary | ICD-10-CM

## 2022-12-15 DIAGNOSIS — J449 Chronic obstructive pulmonary disease, unspecified: Secondary | ICD-10-CM | POA: Diagnosis present

## 2022-12-15 DIAGNOSIS — I10 Essential (primary) hypertension: Secondary | ICD-10-CM | POA: Diagnosis present

## 2022-12-15 DIAGNOSIS — A084 Viral intestinal infection, unspecified: Secondary | ICD-10-CM | POA: Diagnosis present

## 2022-12-15 DIAGNOSIS — Z79899 Other long term (current) drug therapy: Secondary | ICD-10-CM

## 2022-12-15 DIAGNOSIS — N200 Calculus of kidney: Secondary | ICD-10-CM | POA: Diagnosis not present

## 2022-12-15 DIAGNOSIS — Z823 Family history of stroke: Secondary | ICD-10-CM | POA: Diagnosis not present

## 2022-12-15 DIAGNOSIS — E871 Hypo-osmolality and hyponatremia: Secondary | ICD-10-CM | POA: Diagnosis present

## 2022-12-15 LAB — CBC
HCT: 34.6 % — ABNORMAL LOW (ref 36.0–46.0)
Hemoglobin: 11.7 g/dL — ABNORMAL LOW (ref 12.0–15.0)
MCH: 30.4 pg (ref 26.0–34.0)
MCHC: 33.8 g/dL (ref 30.0–36.0)
MCV: 89.9 fL (ref 80.0–100.0)
Platelets: 317 10*3/uL (ref 150–400)
RBC: 3.85 MIL/uL — ABNORMAL LOW (ref 3.87–5.11)
RDW: 13.5 % (ref 11.5–15.5)
WBC: 10.7 10*3/uL — ABNORMAL HIGH (ref 4.0–10.5)
nRBC: 0 % (ref 0.0–0.2)

## 2022-12-15 LAB — URINALYSIS, ROUTINE W REFLEX MICROSCOPIC
Bilirubin Urine: NEGATIVE
Glucose, UA: NEGATIVE mg/dL
Hgb urine dipstick: NEGATIVE
Ketones, ur: NEGATIVE mg/dL
Leukocytes,Ua: NEGATIVE
Nitrite: NEGATIVE
Protein, ur: 30 mg/dL — AB
Specific Gravity, Urine: 1.012 (ref 1.005–1.030)
pH: 6 (ref 5.0–8.0)

## 2022-12-15 LAB — HEPATIC FUNCTION PANEL
ALT: 25 U/L (ref 0–44)
AST: 47 U/L — ABNORMAL HIGH (ref 15–41)
Albumin: 2.9 g/dL — ABNORMAL LOW (ref 3.5–5.0)
Alkaline Phosphatase: 64 U/L (ref 38–126)
Bilirubin, Direct: <0.1 mg/dL (ref 0.0–0.2)
Total Bilirubin: 0.6 mg/dL (ref 0.3–1.2)
Total Protein: 6.7 g/dL (ref 6.5–8.1)

## 2022-12-15 LAB — PHOSPHORUS: Phosphorus: 3.2 mg/dL (ref 2.5–4.6)

## 2022-12-15 LAB — BASIC METABOLIC PANEL
Anion gap: 17 — ABNORMAL HIGH (ref 5–15)
Anion gap: 12 (ref 5–15)
BUN: 54 mg/dL — ABNORMAL HIGH (ref 8–23)
BUN: 51 mg/dL — ABNORMAL HIGH (ref 8–23)
CO2: 23 mmol/L (ref 22–32)
CO2: 27 mmol/L (ref 22–32)
Calcium: 9.1 mg/dL (ref 8.9–10.3)
Calcium: 8.7 mg/dL — ABNORMAL LOW (ref 8.9–10.3)
Chloride: 90 mmol/L — ABNORMAL LOW (ref 98–111)
Chloride: 92 mmol/L — ABNORMAL LOW (ref 98–111)
Creatinine, Ser: 2.52 mg/dL — ABNORMAL HIGH (ref 0.44–1.00)
Creatinine, Ser: 2.33 mg/dL — ABNORMAL HIGH (ref 0.44–1.00)
GFR, Estimated: 19 mL/min — ABNORMAL LOW (ref >60)
GFR, Estimated: 20 mL/min — ABNORMAL LOW (ref >60)
Glucose, Bld: 178 mg/dL — ABNORMAL HIGH (ref 70–99)
Glucose, Bld: 134 mg/dL — ABNORMAL HIGH (ref 70–99)
Potassium: 2.5 mmol/L — CL (ref 3.5–5.1)
Potassium: 2.9 mmol/L — ABNORMAL LOW (ref 3.5–5.1)
Sodium: 130 mmol/L — ABNORMAL LOW (ref 135–145)
Sodium: 131 mmol/L — ABNORMAL LOW (ref 135–145)

## 2022-12-15 LAB — LIPASE, BLOOD: Lipase: 61 U/L — ABNORMAL HIGH (ref 11–51)

## 2022-12-15 LAB — RESP PANEL BY RT-PCR (RSV, FLU A&B, COVID)  RVPGX2
Influenza A by PCR: NEGATIVE
Influenza B by PCR: NEGATIVE
Resp Syncytial Virus by PCR: NEGATIVE
SARS Coronavirus 2 by RT PCR: NEGATIVE

## 2022-12-15 LAB — CBG MONITORING, ED: Glucose-Capillary: 151 mg/dL — ABNORMAL HIGH (ref 70–99)

## 2022-12-15 LAB — MAGNESIUM: Magnesium: 2.3 mg/dL (ref 1.7–2.4)

## 2022-12-15 LAB — CK: Total CK: 314 U/L — ABNORMAL HIGH (ref 38–234)

## 2022-12-15 LAB — LACTIC ACID, PLASMA
Lactic Acid, Venous: 1.3 mmol/L (ref 0.5–1.9)
Lactic Acid, Venous: 0.9 mmol/L (ref 0.5–1.9)

## 2022-12-15 MED ORDER — ONDANSETRON HCL 4 MG/2ML IJ SOLN: 4.0000 mg | Freq: Four times a day (QID) | INTRAMUSCULAR | Status: DC | PRN

## 2022-12-15 MED ORDER — POTASSIUM CHLORIDE IN NACL 20-0.9 MEQ/L-% IV SOLN
INTRAVENOUS | Status: AC
  Filled 2022-12-15 (×3): qty 1000

## 2022-12-15 MED ORDER — MAGNESIUM OXIDE -MG SUPPLEMENT 400 (240 MG) MG PO TABS
800.0000 mg | ORAL_TABLET | Freq: Once | ORAL | Status: AC
  Administered 2022-12-15: 800 mg via ORAL
  Filled 2022-12-15: qty 2

## 2022-12-15 MED ORDER — SODIUM CHLORIDE 0.9 % IV SOLN: INTRAVENOUS | Status: DC

## 2022-12-15 MED ORDER — GABAPENTIN 100 MG PO CAPS
100.0000 mg | ORAL_CAPSULE | Freq: Every day | ORAL | Status: DC
  Administered 2022-12-16: 100 mg via ORAL
  Filled 2022-12-15 (×2): qty 1

## 2022-12-15 MED ORDER — POTASSIUM CHLORIDE 10 MEQ/100ML IV SOLN
10.0000 meq | Freq: Once | INTRAVENOUS | Status: AC
  Administered 2022-12-15: 10 meq via INTRAVENOUS
  Filled 2022-12-15: qty 100

## 2022-12-15 MED ORDER — FLUTICASONE PROPIONATE 50 MCG/ACT NA SUSP
2.0000 | Freq: Every day | NASAL | Status: DC
  Administered 2022-12-16 – 2022-12-17 (×2): 2 via NASAL
  Filled 2022-12-15: qty 16

## 2022-12-15 MED ORDER — PANTOPRAZOLE SODIUM 20 MG PO TBEC
20.0000 mg | DELAYED_RELEASE_TABLET | Freq: Every day | ORAL | Status: DC
  Administered 2022-12-16 – 2022-12-17 (×2): 20 mg via ORAL
  Filled 2022-12-15 (×2): qty 1

## 2022-12-15 MED ORDER — GABAPENTIN 100 MG PO CAPS: 100.0000 mg | ORAL_CAPSULE | ORAL | Status: DC

## 2022-12-15 MED ORDER — ACETAMINOPHEN 650 MG RE SUPP: 650.0000 mg | Freq: Four times a day (QID) | RECTAL | Status: DC | PRN

## 2022-12-15 MED ORDER — POTASSIUM CHLORIDE CRYS ER 20 MEQ PO TBCR
40.0000 meq | EXTENDED_RELEASE_TABLET | Freq: Once | ORAL | Status: AC
  Administered 2022-12-15: 40 meq via ORAL
  Filled 2022-12-15: qty 2

## 2022-12-15 MED ORDER — ENSURE ENLIVE PO LIQD
237.0000 mL | Freq: Three times a day (TID) | ORAL | Status: DC
  Administered 2022-12-16 – 2022-12-17 (×4): 237 mL via ORAL

## 2022-12-15 MED ORDER — ROSUVASTATIN CALCIUM 20 MG PO TABS
40.0000 mg | ORAL_TABLET | Freq: Every day | ORAL | Status: DC
  Administered 2022-12-16 – 2022-12-17 (×2): 40 mg via ORAL
  Filled 2022-12-15 (×2): qty 2

## 2022-12-15 MED ORDER — ONDANSETRON HCL 4 MG PO TABS: 4.0000 mg | ORAL_TABLET | Freq: Four times a day (QID) | ORAL | Status: DC | PRN

## 2022-12-15 MED ORDER — ONDANSETRON HCL 4 MG/2ML IJ SOLN
4.0000 mg | Freq: Once | INTRAMUSCULAR | Status: AC
  Administered 2022-12-15: 4 mg via INTRAVENOUS
  Filled 2022-12-15: qty 2

## 2022-12-15 MED ORDER — GABAPENTIN 100 MG PO CAPS
200.0000 mg | ORAL_CAPSULE | Freq: Every day | ORAL | Status: DC
  Filled 2022-12-15: qty 2

## 2022-12-15 MED ORDER — ASPIRIN 81 MG PO TBEC
81.0000 mg | DELAYED_RELEASE_TABLET | Freq: Every day | ORAL | Status: DC
  Administered 2022-12-15 – 2022-12-16 (×2): 81 mg via ORAL
  Filled 2022-12-15 (×2): qty 1

## 2022-12-15 MED ORDER — UMECLIDINIUM BROMIDE 62.5 MCG/ACT IN AEPB
1.0000 | INHALATION_SPRAY | Freq: Every day | RESPIRATORY_TRACT | Status: DC
  Administered 2022-12-16 – 2022-12-17 (×2): 1 via RESPIRATORY_TRACT
  Filled 2022-12-15: qty 7

## 2022-12-15 MED ORDER — LACTATED RINGERS IV BOLUS
1000.0000 mL | Freq: Once | INTRAVENOUS | Status: AC
  Administered 2022-12-15: 1000 mL via INTRAVENOUS

## 2022-12-15 MED ORDER — ACETAMINOPHEN 325 MG PO TABS
650.0000 mg | ORAL_TABLET | Freq: Four times a day (QID) | ORAL | Status: DC | PRN
  Administered 2022-12-16 (×2): 650 mg via ORAL
  Filled 2022-12-15 (×2): qty 2

## 2022-12-15 MED ORDER — HEPARIN SODIUM (PORCINE) 5000 UNIT/ML IJ SOLN
5000.0000 [IU] | Freq: Two times a day (BID) | INTRAMUSCULAR | Status: DC
  Administered 2022-12-15 – 2022-12-17 (×5): 5000 [IU] via SUBCUTANEOUS
  Filled 2022-12-15 (×5): qty 1

## 2022-12-15 MED ORDER — HYDRALAZINE HCL 10 MG PO TABS: 10.0000 mg | ORAL_TABLET | Freq: Four times a day (QID) | ORAL | Status: DC | PRN

## 2022-12-15 NOTE — ED Triage Notes (Addendum)
Pt c/o weakness started in mid January and has gotten progressively worse. Pt c/o abd pain, leg pain bilat and Hax60mo Pt is slow to answer questions.

## 2022-12-15 NOTE — ED Notes (Signed)
MD notified of critical potassium 2.5

## 2022-12-15 NOTE — ED Provider Notes (Signed)
[facility] Provider Note  CSN: [id number] Arrival date & time: 12/15/22 1100  Chief Complaint(s) Weakness  HPI Elizabeth [name] is a 82 y.o. female with PMH COPD, aortic stenosis, peripheral neuropathy, pancreatitis who presents emergency department for evaluation of generalized weakness and myalgias.  Patient has had a variety of symptoms over the last 1 month that primarily began as upper respiratory symptoms with cough and sinus congestion.  She was placed on an antibiotic through urgent care but did not have relief of symptoms and was ultimately placed on an additional antibiotic that caused her to break out in hives.  She was then transition to steroids with prednisone and was on prednisone from 12/04/2022 to 12/08/2022.  She states that her functional status has continued to decline and she arrives fatigued, hypotensive with complaints of generalized weakness.  Denies chest pain, shortness of breath, headache, fever or other systemic symptoms.  Does endorse nausea.   Past Medical History Past Medical History:  Diagnosis Date   Abdominal cramping    possibly irritable bowel syndrome versus adhesions secondary to multiple surgeries and abdominal inflammation and 2009 secondary to diverticulitis and diverticular phlegmon   Allergic rhinitis    Arthritis    Carotid artery disease (HCC)    0000000 RICA, < [id number] LICA [id number] [name] ([facility])   Cataract    Chest pain syndrome 2004   with adenosine Cardiolite that was normal   Chronic hoarseness    COPD (chronic obstructive pulmonary disease) (HCC)    Dyspnea    GERD (gastroesophageal reflux disease)    Heart murmur    Mild AS, AI. AV pk grad 18.79 mmHg, mn grad 11.20 mmHg, AVA (VTI) 1.05 cm2 10/10/20, 3 year f/u rec ([facility])   History of kidney stones    History of renal calculi    Hypercholesterolemia    Hypertension    Incontinence    Cough incontinence   Mild depression    Neuromuscular disorder  (HCC)    neuropathy bilateral lower extremities   Osteopenia    Peripheral vascular disease (HCC)    with femoral and carotid bruits   PONV (postoperative nausea and vomiting)    Recurrent pancreatitis    Tobacco dependence    Vitamin D deficiency    Patient Active Problem List   Diagnosis Date Noted   Senile osteoporosis 10/07/2022   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   Recurrent abdominal pain 04/19/2022   History of pancreatitis 04/19/2022   Anemia 04/19/2022   Allergic rhinitis 01/03/2022   Amnesia 01/03/2022   Aortic valve insufficiency 01/03/2022   Cardiac murmur, unspecified 01/03/2022   Cervical radiculopathy 01/03/2022   Chronic fatigue, unspecified 01/03/2022   Disorder of respiratory system 01/03/2022   Dyspepsia 01/03/2022   Hypersomnia 01/03/2022   Occlusion and stenosis of bilateral carotid arteries 01/03/2022   Osteochondropathy 01/03/2022   Colon cancer screening 01/03/2022   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   Paresthesia of skin 01/03/2022   Peripheral neuropathic pain 01/03/2022   Pure hypercholesterolemia 01/03/2022   Sciatica 01/03/2022   Tobacco user 01/03/2022   Ventricular premature depolarization 01/03/2022   Vitamin D deficiency 01/03/2022   Weight loss 01/03/2022   Tendinitis of shoulder 01/01/2022   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   Localized, primary osteoarthritis of shoulder region 01/01/2022   Numbness of lower limb 01/01/2022   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   Shoulder joint pain 01/01/2022   Abdominal fluid collection 12/12/2021   Elevated troponin level not due myocardial infarction 12/12/2021  PVD (peripheral vascular disease) ([place]) 12/03/2021   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   Community acquired pneumonia 04/04/2021   Hyponatremia 04/04/2021   Gastro-esophageal reflux disease without esophagitis 04/04/2021   Mixed hyperlipidemia 04/04/2021   Pain in right foot 01/10/2021   Acute pancreatitis 11/17/2020   COPD (chronic obstructive  pulmonary disease) ([place])    Essential hypertension    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 Authorizing Provider  amoxicillin-clavulanate (AUGMENTIN) 500-125 MG tablet Take 1 tablet by mouth 2 (two) times daily. 12/04/22   Nyoka Lint, PA-C  aspirin EC 81 MG tablet Take 81 mg by mouth at bedtime. Swallow whole.    [provider]  b complex vitamins capsule Take 1 capsule by mouth daily.    [provider]  cloNIDine (CATAPRES) 0.1 MG tablet Take 0.2 mg by mouth at bedtime.    [provider]  Coenzyme Q10 100 MG capsule Take 100 mg by mouth every morning.    [provider]  feeding supplement (ENSURE ENLIVE / ENSURE PLUS) LIQD Take 237 mLs by mouth 3 (three) times daily between meals. 12/18/21   Nolberto Hanlon, MD  fluticasone (FLONASE) 50 MCG/ACT nasal spray Place 2 sprays into both nostrils daily. 12/04/22   Nyoka Lint, PA-C  gabapentin (NEURONTIN) 100 MG capsule Take 2 capsules (200 mg total) by mouth at bedtime. PATIENT NEEDS OFFICE VISIT FOR ADDITIONAL REFILLS Patient taking differently: Take 100-200 mg by mouth See admin instructions. Take one capsule (100 mg) by mouth every morning and two capsules (200 mg) at night 07/30/15   [name], MD  INCRUSE ELLIPTA 62.5 MCG/INH AEPB Inhale 1 puff into the lungs daily. 11/21/20   Rai, Vernelle Emerald, MD  irbesartan-hydrochlorothiazide (AVALIDE) 150-12.5 MG tablet Take 1 tablet by mouth daily.    [provider]  Menthol, Topical Analgesic, (ICY HOT EX) Apply 1 application topically daily as needed (pain).    [provider]  metoprolol succinate (TOPROL-XL) 25 MG 24 hr tablet Take 25 mg by mouth at bedtime.    [provider]  Multiple Vitamin (MULTIVITAMIN WITH MINERALS) TABS tablet Take 1 tablet by  mouth 2 (two) times daily.    [provider]  pantoprazole (PROTONIX) 20 MG tablet TAKE 1 TABLET BY MOUTH EVERY DAY 05/26/22   Mansouraty, Telford Nab., MD  predniSONE (DELTASONE) 10 MG tablet Take 1 tablet (10 mg total) by mouth in the morning, at noon, and at bedtime. 12/04/22   Nyoka Lint, PA-C  rosuvastatin (CRESTOR) 40 MG tablet Take 1 tablet (40 mg total) by mouth daily. 10/14/22 10/09/23  Wellington Hampshire, MD  TURMERIC CURCUMIN PO Take 1 capsule by mouth daily. With Ginger    [provider]  VITAMIN D PO Take 1 capsule by mouth daily.    [provider]  VITAMIN E PO Take 1 capsule by mouth daily.    [provider]  Past Surgical History Past Surgical History:  Procedure Laterality Date   ABDOMINAL AORTOGRAM W/LOWER EXTREMITY N/A 09/18/2021   Procedure: ABDOMINAL AORTOGRAM W/LOWER EXTREMITY;  Surgeon: [name], MD;  Location: [facility];  Service: Cardiovascular;  Laterality: N/A;   AORTA - BILATERAL FEMORAL ARTERY BYPASS GRAFT Bilateral 12/03/2021   Procedure: AORTOBIFEMORAL BYPASS GRAFT USING A 14 X 37m HEMASHIELD GOLD GRAFT;  Surgeon: RBroadus John MD;  Location: [facility]  Service: Vascular;  Laterality: Bilateral;   APPENDECTOMY     BIOPSY  02/24/2022   Procedure: BIOPSY;  Surgeon: MIrving Copas, MD;  Location: [facility];  Service: Gastroenterology;;   BREAST BIOPSY     CATARACT EXTRACTION, BILATERAL     COLON SURGERY     2009   COSMETIC SURGERY     on neck and eyes   DILATION AND CURETTAGE OF UTERUS     ESOPHAGOGASTRODUODENOSCOPY (EGD) WITH PROPOFOL N/A 02/24/2022   Procedure: ESOPHAGOGASTRODUODENOSCOPY (EGD) WITH PROPOFOL;  Surgeon: MIrving Copas, MD;  [facility];  Service: Gastroenterology;  Laterality: N/A;   EUS N/A 02/24/2022   Procedure: UPPER ENDOSCOPIC  ULTRASOUND (EUS) RADIAL;  Surgeon: MIrving Copas, MD;  Location: [facility];  Service: Gastroenterology;  Laterality: N/A;   EYE SURGERY     2014   FEMUR SURGERY  12/2007   left femur surgery--for femoral neck fracture status post fall    FRACTURE SURGERY     broke left leg and left arm- 2012   HOT HEMOSTASIS N/A 02/24/2022   Procedure: HOT HEMOSTASIS (ARGON PLASMA COAGULATION/BICAP);  Surgeon: MIrving Copas, MD;  [facility];  Service: Gastroenterology;  Laterality: N/A;   LIPOSUCTION     LUMBAR LAMINECTOMY/DECOMPRESSION MICRODISCECTOMY N/A 11/12/2020   Procedure: LAMINECTOMY AND FORAMINOTOMY Lumbar three four, Lumbar four five, Lumbar Five Sacral one;  Surgeon: JNewman Pies MD;  Location: [facility]  Service: Neurosurgery;  Laterality: N/A;   OTHER SURGICAL HISTORY     sigmoid colectomy   SHOULDER SURGERY     arthroscopic shoulder surgery    TONSILLECTOMY     TUBAL LIGATION     Family History Family History  Problem Relation Age of Onset   Heart disease Mother    Stroke Mother    Colon cancer Neg Hx    Esophageal cancer Neg Hx    Stomach cancer Neg Hx    Inflammatory bowel disease Neg Hx    Liver disease Neg Hx    Pancreatic cancer Neg Hx    Rectal cancer Neg Hx     Social History Social History   Tobacco Use   Smoking status: Some Days    Packs/day: 1.00    Years: 60.00    Total pack years: 60.00    Types: Cigarettes    Passive exposure: Never   Smokeless tobacco: Never  Vaping Use   Vaping Use: Never used  Substance Use Topics   Alcohol use: Yes    Alcohol/week: 0.0 standard drinks of alcohol    Comment: Occ glass of wine   Drug use: No   Allergies Alendronate sodium, Azithromycin, Chantix [varenicline], Other, Pollen extract, Tegretol [carbamazepine], Lisinopril, and Tetracyclines & related  Review of Systems Review of Systems  Constitutional:  Positive for chills and fatigue.  Musculoskeletal:  Positive for arthralgias and  myalgias.    Physical Exam Vital Signs  I have reviewed the triage vital signs BP (!) 98/48   Pulse 92   Temp 97.8 F (36.6 C)  Resp (!) 22   Ht 5' (1.524 m)   Wt 48.1 kg   SpO2 97%   BMI 20.71 kg/m   Physical Exam Vitals and nursing note reviewed.  Constitutional:      General: She is not in acute distress.    Appearance: She is well-developed. She is ill-appearing.  HENT:     Head: Normocephalic and atraumatic.  Eyes:     Conjunctiva/sclera: Conjunctivae normal.  Cardiovascular:     Rate and Rhythm: Normal rate and regular rhythm.     Heart sounds: No murmur heard. Pulmonary:     Effort: Pulmonary effort is normal. No respiratory distress.     Breath sounds: Normal breath sounds.  Abdominal:     Palpations: Abdomen is soft.     Tenderness: There is abdominal tenderness.  Musculoskeletal:        General: No swelling.     Cervical back: Neck supple.  Skin:    General: Skin is warm and dry.     Capillary Refill: Capillary refill takes less than 2 seconds.  Neurological:     Mental Status: She is alert.  Psychiatric:        Mood and Affect: Mood normal.     ED Results and Treatments Labs (all labs ordered are listed, but only abnormal results are displayed) Labs Reviewed  BASIC METABOLIC PANEL - Abnormal; Notable for the following components:      Result Value   Sodium 130 (*)    Potassium 2.5 (*)    Chloride 90 (*)    Glucose, Bld 178 (*)    BUN 54 (*)    Creatinine, Ser 2.52 (*)    GFR, Estimated 19 (*)    Anion gap 17 (*)    All other components within normal limits  CBC - Abnormal; Notable for the following components:   WBC 10.7 (*)    RBC 3.85 (*)    Hemoglobin 11.7 (*)    HCT 34.6 (*)    All other components within normal limits  CBG MONITORING, ED - Abnormal; Notable for the following components:   Glucose-Capillary 151 (*)    All other components within normal limits  RESP PANEL BY RT-PCR (RSV, FLU A&B, COVID)  RVPGX2  URINALYSIS,  ROUTINE W REFLEX MICROSCOPIC  LIPASE, BLOOD  HEPATIC FUNCTION PANEL  CK                                                                                                                          Radiology No results found.  Pertinent labs & imaging results that were available during my care of the patient were reviewed by me and considered in my medical decision making (see MDM for details).  Medications Ordered in ED Medications  potassium chloride 10 mEq in 100 mL IVPB (has no administration in time range)  lactated ringers bolus 1,000 mL (1,000 mLs Intravenous New Bag/Given 12/15/22 1159)  ondansetron (ZOFRAN) injection 4 mg (4 mg Intravenous Given  12/15/22 1216)  potassium chloride SA (KLOR-CON M) CR tablet 40 mEq (40 mEq Oral Given 12/15/22 1215)  magnesium oxide (MAG-OX) tablet 800 mg (800 mg Oral Given 12/15/22 1215)                                                                                                                                     Procedures Procedures  (including critical care time)  Medical Decision Making / ED Course   This patient presents to the ED for concern of fatigue, diarrhea, abdominal pain, this involves an extensive number of treatment options, and is a complaint that carries with it a high risk of complications and morbidity.  The differential diagnosis includes viral illness, dehydration, postobstructive uropathy, electrolyte abnormality, diverticulitis, medication side effect  MDM: Patient seen Emergency Department for evaluation of generalized fatigue, diarrhea and abdominal pain.  Physical exam reveals a fatigued appearing patient with left lower quadrant tenderness to palpation but is otherwise unremarkable.  Laboratory evaluation with leukocytosis to 10.7, hemoglobin 11.7 which is slightly decreased from previous but improved from 1 year ago.  Chemistry is concerning with a new BUN to 51, creatinine 2.33 which is a significant elevation for the  patient.  Hypokalemia 2.9, hyponatremia 131, hypouremia to 92.  Hypoalbuminemia at 2.9.  Chest x-ray unremarkable.  CT abdomen pelvis without contrast largely unremarkable.  Patient hypotensive on arrival but pressures improved with aggressive fluid resuscitation.  Patient will require hospital admission for suspected prerenal AKI and dehydration.  Patient then admitted.   Additional history obtained: -Additional history obtained from daughter -External records from outside source obtained and reviewed including: Chart review including previous notes, labs, imaging, consultation notes   Lab Tests: -I ordered, reviewed, and interpreted labs.   The pertinent results include:   Labs Reviewed  BASIC METABOLIC PANEL - Abnormal; Notable for the following components:      Result Value   Sodium 130 (*)    Potassium 2.5 (*)    Chloride 90 (*)    Glucose, Bld 178 (*)    BUN 54 (*)    Creatinine, Ser 2.52 (*)    GFR, Estimated 19 (*)    Anion gap 17 (*)    All other components within normal limits  CBC - Abnormal; Notable for the following components:   WBC 10.7 (*)    RBC 3.85 (*)    Hemoglobin 11.7 (*)    HCT 34.6 (*)    All other components within normal limits  CBG MONITORING, ED - Abnormal; Notable for the following components:   Glucose-Capillary 151 (*)    All other components within normal limits  RESP PANEL BY RT-PCR (RSV, FLU A&B, COVID)  RVPGX2  URINALYSIS, ROUTINE W REFLEX MICROSCOPIC  LIPASE, BLOOD  HEPATIC FUNCTION PANEL  CK        Imaging Studies ordered: I ordered imaging studies including chest x-ray, CT abdomen pelvis I independently visualized  and interpreted imaging. I agree with the radiologist interpretation   Medicines ordered and prescription drug management: Meds ordered this encounter  Medications   lactated ringers bolus 1,000 mL   ondansetron (ZOFRAN) injection 4 mg   potassium chloride SA (KLOR-CON M) CR tablet 40 mEq   magnesium oxide (MAG-OX)  tablet 800 mg   potassium chloride 10 mEq in 100 mL IVPB    -I have reviewed the patients home medicines and have made adjustments as needed  Critical interventions Fluid resuscitation, electrolyte repletion  Cardiac Monitoring: The patient was maintained on a cardiac monitor.  I personally viewed and interpreted the cardiac monitored which showed an underlying rhythm of: NSR  Social Determinants of Health:  Factors impacting patients care include: Patient is very active for her age and is still working   Reevaluation: After the interventions noted above, I reevaluated the patient and found that they have :improved  Co morbidities that complicate the patient evaluation  Past Medical History:  Diagnosis Date   Abdominal cramping    possibly irritable bowel syndrome versus adhesions secondary to multiple surgeries and abdominal inflammation and 2009 secondary to diverticulitis and diverticular phlegmon   Allergic rhinitis    Arthritis    Carotid artery disease (HCC)    0000000 RICA, < [id number] LICA [id number] [name] ([facility])   Cataract    Chest pain syndrome 2004   with adenosine Cardiolite that was normal   Chronic hoarseness    COPD (chronic obstructive pulmonary disease) (HCC)    Dyspnea    GERD (gastroesophageal reflux disease)    Heart murmur    Mild AS, AI. AV pk grad 18.79 mmHg, mn grad 11.20 mmHg, AVA (VTI) 1.05 cm2 10/10/20, 3 year f/u rec ([facility])   History of kidney stones    History of renal calculi    Hypercholesterolemia    Hypertension    Incontinence    Cough incontinence   Mild depression    Neuromuscular disorder ([place])    neuropathy bilateral lower extremities   Osteopenia    Peripheral vascular disease ([place])    with femoral and carotid bruits   PONV (postoperative nausea and vomiting)    Recurrent pancreatitis    Tobacco dependence    Vitamin D deficiency       Dispostion: I considered admission for this patient, and due to prerenal AKI and suspected  dehydration patient require hospital admission     Final Clinical Impression(s) / ED Diagnoses Final diagnoses:  None     @PCDICTATION$ @    Teressa Lower, MD 12/15/22 [phone]

## 2022-12-15 NOTE — ED Notes (Signed)
Pt states IV potassium is hurting so bad she would like to take a break. Told her we could pause the medication and start back up again after giving her arm a short break

## 2022-12-15 NOTE — H&P (Signed)
History and Physical    Elizabeth [name] [id number] DOB: 08/23/1941 DOA: 12/15/2022  PCP: Charlane Ferretti, MD (Confirm with patient/family/NH records and if not entered, this has to be entered at [facility] point of entry) Patient coming from: Home  I have personally briefly reviewed patient's old medical records in [place]  Chief Complaint: Feeling weak  HPI: Elizabeth [name] is a 82 y.o. female with medical history significant of HTN, COPD, multiple OA, COPD, chronic kidney stones, GERD, peripheral neuropathy, presented with worsening of generalized weakness.  Developed severe sinusitis with runny nose and congestion 3 weeks ago completed 1 round of 7 days doxycycline with no significant improvement, 2 weeks ago she was started on Augmentin, soon she started to develop severe watery diarrhea multiple bouts a day without abdominal pain had 1 day of chills but no fever.  And 7 days ago Augmentin discontinued and patient was started on 5 days of steroid which she completed.  After stopping Augmentin she continued to have watery diarrhea until yesterday and became increasingly weak.  Denies any abdominal pain no back pain.  She does report urine output has significant decreased.  She reported her sinus symptoms finally controlled with fluticasone nasal spray  ED Course: Blood pressure borderline low systolic lower 0000000, no tachycardia.  Blood work showed AKI creatinine 2.3 compared to baseline 0.7, K2.5, bicarb 23, WBC 10.7, hemoglobin 11.7 platelet 317  CT abdominal pelvis without contrast showed nonobstructive bilateral kidney stones otherwise no significant acute findings.  Patient was given IV bolus x 1 and p.o. and IV Kcl.  Review of Systems: As per HPI otherwise 14 point review of systems negative.    Past Medical History:  Diagnosis Date   Abdominal cramping    possibly irritable bowel syndrome versus adhesions secondary to multiple surgeries and abdominal inflammation and 2009 secondary to  diverticulitis and diverticular phlegmon   Allergic rhinitis    Arthritis    Carotid artery disease (HCC)    0000000 RICA, < [id number] LICA [id number] [name] ([facility])   Cataract    Chest pain syndrome 2004   with adenosine Cardiolite that was normal   Chronic hoarseness    COPD (chronic obstructive pulmonary disease) (HCC)    Dyspnea    GERD (gastroesophageal reflux disease)    Heart murmur    Mild AS, AI. AV pk grad 18.79 mmHg, mn grad 11.20 mmHg, AVA (VTI) 1.05 cm2 10/10/20, 3 year f/u rec ([facility])   History of kidney stones    History of renal calculi    Hypercholesterolemia    Hypertension    Incontinence    Cough incontinence   Mild depression    Neuromuscular disorder (HCC)    neuropathy bilateral lower extremities   Osteopenia    Peripheral vascular disease ([place])    with femoral and carotid bruits   PONV (postoperative nausea and vomiting)    Recurrent pancreatitis    Tobacco dependence    Vitamin D deficiency     Past Surgical History:  Procedure Laterality Date   ABDOMINAL AORTOGRAM W/LOWER EXTREMITY N/A 09/18/2021   Procedure: ABDOMINAL AORTOGRAM W/LOWER EXTREMITY;  Surgeon: [name], MD;  Location: [facility];  Service: Cardiovascular;  Laterality: N/A;   AORTA - BILATERAL FEMORAL ARTERY BYPASS GRAFT Bilateral 12/03/2021   Procedure: AORTOBIFEMORAL BYPASS GRAFT USING A 14 X 59m HEMASHIELD GOLD GRAFT;  Surgeon: RBroadus John MD;  Location: [facility]  Service: Vascular;  Laterality: Bilateral;   APPENDECTOMY  BIOPSY  02/24/2022   Procedure: BIOPSY;  Surgeon: Rush Landmark Telford Nab., MD;  Location: [facility];  Service: Gastroenterology;;   BREAST BIOPSY     CATARACT EXTRACTION, BILATERAL     COLON SURGERY     2009   COSMETIC SURGERY     on neck and eyes   DILATION AND CURETTAGE OF UTERUS     ESOPHAGOGASTRODUODENOSCOPY (EGD) WITH PROPOFOL N/A 02/24/2022   Procedure: ESOPHAGOGASTRODUODENOSCOPY (EGD) WITH PROPOFOL;  Surgeon: Irving Copas., MD;   Location: [facility];  Service: Gastroenterology;  Laterality: N/A;   EUS N/A 02/24/2022   Procedure: UPPER ENDOSCOPIC ULTRASOUND (EUS) RADIAL;  Surgeon: Irving Copas., MD;  Location: [facility];  Service: Gastroenterology;  Laterality: N/A;   EYE SURGERY     2014   FEMUR SURGERY  12/2007   left femur surgery--for femoral neck fracture status post fall    FRACTURE SURGERY     broke left leg and left arm- 2012   HOT HEMOSTASIS N/A 02/24/2022   Procedure: HOT HEMOSTASIS (ARGON PLASMA COAGULATION/BICAP);  Surgeon: Irving Copas., MD;  Location: [facility];  Service: Gastroenterology;  Laterality: N/A;   LIPOSUCTION     LUMBAR LAMINECTOMY/DECOMPRESSION MICRODISCECTOMY N/A 11/12/2020   Procedure: LAMINECTOMY AND FORAMINOTOMY Lumbar three four, Lumbar four five, Lumbar Five Sacral one;  Surgeon: Newman Pies, MD;  Location: [facility];  Service: Neurosurgery;  Laterality: N/A;   OTHER SURGICAL HISTORY     sigmoid colectomy   SHOULDER SURGERY     arthroscopic shoulder surgery    TONSILLECTOMY     TUBAL LIGATION       reports that she has been smoking cigarettes. She has a 60.00 pack-year smoking history. She has never been exposed to tobacco smoke. She has never used smokeless tobacco. She reports current alcohol use. She reports that she does not use drugs.  Allergies  Allergen Reactions   Alendronate Sodium     loose teeth   Azithromycin     Other reaction(s): N,V,D.Marland KitchenMarland Kitchen? 1 time occurence?   Chantix [Varenicline]     Other reaction(s): N/V/D and flu sxs   Other Other (See Comments)    Chlorexolone - unknown reaction   Pollen Extract     Allergic to tree pollens   Tegretol [Carbamazepine] Swelling   Lisinopril Rash   Tetracyclines & Related Rash    Family History  Problem Relation Age of Onset   Heart disease Mother    Stroke Mother    Colon cancer Neg Hx    Esophageal cancer Neg Hx    Stomach cancer Neg Hx    Inflammatory bowel disease Neg Hx    Liver  disease Neg Hx    Pancreatic cancer Neg Hx    Rectal cancer Neg Hx      Prior to Admission medications   Medication Sig Start Date End Date Taking? Authorizing Provider  amoxicillin-clavulanate (AUGMENTIN) 500-125 MG tablet Take 1 tablet by mouth 2 (two) times daily. 12/04/22   Nyoka Lint, PA-C  aspirin EC 81 MG tablet Take 81 mg by mouth at bedtime. Swallow whole.    [provider]  b complex vitamins capsule Take 1 capsule by mouth daily.    [provider]  cloNIDine (CATAPRES) 0.1 MG tablet Take 0.2 mg by mouth at bedtime.    [provider]  Coenzyme Q10 100 MG capsule Take 100 mg by mouth every morning.    [provider]  feeding supplement (ENSURE ENLIVE / ENSURE PLUS) LIQD Take 237  mLs by mouth 3 (three) times daily between meals. 12/18/21   Nolberto Hanlon, MD  fluticasone (FLONASE) 50 MCG/ACT nasal spray Place 2 sprays into both nostrils daily. 12/04/22   Nyoka Lint, PA-C  gabapentin (NEURONTIN) 100 MG capsule Take 2 capsules (200 mg total) by mouth at bedtime. PATIENT NEEDS OFFICE VISIT FOR ADDITIONAL REFILLS Patient taking differently: Take 100-200 mg by mouth See admin instructions. Take one capsule (100 mg) by mouth every morning and two capsules (200 mg) at night 07/30/15   [name], MD  INCRUSE ELLIPTA 62.5 MCG/INH AEPB Inhale 1 puff into the lungs daily. 11/21/20   Rai, Vernelle Emerald, MD  irbesartan-hydrochlorothiazide (AVALIDE) 150-12.5 MG tablet Take 1 tablet by mouth daily.    [provider]  Menthol, Topical Analgesic, (ICY HOT EX) Apply 1 application topically daily as needed (pain).    [provider]  metoprolol succinate (TOPROL-XL) 25 MG 24 hr tablet Take 25 mg by mouth at bedtime.    [provider]  Multiple Vitamin (MULTIVITAMIN WITH MINERALS) TABS tablet Take 1 tablet by mouth 2 (two) times daily.    [provider]  pantoprazole (PROTONIX) 20 MG tablet TAKE 1 TABLET BY MOUTH EVERY DAY 05/26/22    Mansouraty, Telford Nab., MD  predniSONE (DELTASONE) 10 MG tablet Take 1 tablet (10 mg total) by mouth in the morning, at noon, and at bedtime. 12/04/22   Nyoka Lint, PA-C  rosuvastatin (CRESTOR) 40 MG tablet Take 1 tablet (40 mg total) by mouth daily. 10/14/22 10/09/23  Wellington Hampshire, MD  TURMERIC CURCUMIN PO Take 1 capsule by mouth daily. With Ginger    [provider]  VITAMIN D PO Take 1 capsule by mouth daily.    [provider]  VITAMIN E PO Take 1 capsule by mouth daily.    [provider]    Physical Exam: Vitals:   12/15/22 1105 12/15/22 1106 12/15/22 1315  BP: (!) 98/48  121/63  Pulse: 92  82  Resp: (!) 22  (!) 24  Temp: 97.8 F (36.6 C)    SpO2: 97%  100%  Weight:  48.1 kg   Height:  5' (1.524 m)     Constitutional: NAD, calm, comfortable Vitals:   12/15/22 1105 12/15/22 1106 12/15/22 1315  BP: (!) 98/48  121/63  Pulse: 92  82  Resp: (!) 22  (!) 24  Temp: 97.8 F (36.6 C)    SpO2: 97%  100%  Weight:  48.1 kg   Height:  5' (1.524 m)    Eyes: PERRL, lids and conjunctivae normal ENMT: Mucous membranes are dry. Posterior pharynx clear of any exudate or lesions.Normal dentition.  Neck: normal, supple, no masses, no thyromegaly Respiratory: clear to auscultation bilaterally, no wheezing, no crackles. Normal respiratory effort. No accessory muscle use.  Cardiovascular: Regular rate and rhythm, no murmurs / rubs / gallops. No extremity edema. 2+ pedal pulses. No carotid bruits.  Abdomen: no tenderness, no masses palpated. No hepatosplenomegaly. Bowel sounds positive.  Musculoskeletal: no clubbing / cyanosis. No joint deformity upper and lower extremities. Good ROM, no contractures. Normal muscle tone.  Skin: no rashes, lesions, ulcers. No induration Neurologic: CN 2-12 grossly intact. Sensation intact, DTR normal. Strength 5/5 in all 4.  Psychiatric: Normal judgment and insight. Alert and oriented x 3. Normal mood.     Labs on Admission:  I have personally reviewed following labs and imaging studies  CBC: Recent Labs  Lab 12/15/22 1127  WBC 10.7*  HGB 11.7*  HCT 34.6*  MCV 89.9  PLT [id number]   Basic Metabolic Panel: Recent Labs  Lab 12/15/22 1127  NA 130*  K 2.5*  CL 90*  CO2 23  GLUCOSE 178*  BUN 54*  CREATININE 2.52*  CALCIUM 9.1   GFR: Estimated Creatinine Clearance: 12.4 mL/min (A) (by C-G formula based on SCr of 2.52 mg/dL (H)). Liver Function Tests: Recent Labs  Lab 12/15/22 1127  AST 47*  ALT 25  ALKPHOS 64  BILITOT 0.6  PROT 6.7  ALBUMIN 2.9*   Recent Labs  Lab 12/15/22 1127  LIPASE 61*   No results for input(s): "AMMONIA" in the last 168 hours. Coagulation Profile: No results for input(s): "INR", "PROTIME" in the last 168 hours. Cardiac Enzymes: Recent Labs  Lab 12/15/22 1127  CKTOTAL 314*   BNP (last 3 results) No results for input(s): "PROBNP" in the last 8760 hours. HbA1C: No results for input(s): "HGBA1C" in the last 72 hours. CBG: Recent Labs  Lab 12/15/22 1136  GLUCAP 151*   Lipid Profile: No results for input(s): "CHOL", "HDL", "LDLCALC", "TRIG", "CHOLHDL", "LDLDIRECT" in the last 72 hours. Thyroid Function Tests: No results for input(s): "TSH", "T4TOTAL", "FREET4", "T3FREE", "THYROIDAB" in the last 72 hours. Anemia Panel: No results for input(s): "VITAMINB12", "FOLATE", "FERRITIN", "TIBC", "IRON", "RETICCTPCT" in the last 72 hours. Urine analysis:    Component Value Date/Time   COLORURINE YELLOW 12/12/2021 1011   APPEARANCEUR CLEAR 12/12/2021 1011   LABSPEC 1.010 12/12/2021 1011   PHURINE 6.0 12/12/2021 1011   GLUCOSEU NEGATIVE 12/12/2021 1011   HGBUR NEGATIVE 12/12/2021 [place] 12/12/2021 [place] 12/12/2021 1011   PROTEINUR NEGATIVE 12/12/2021 1011   NITRITE POSITIVE (A) 12/12/2021 1011   LEUKOCYTESUR NEGATIVE 12/12/2021 1011    Radiological Exams on Admission: CT ABDOMEN PELVIS WO CONTRAST  Result Date:  12/15/2022 CLINICAL DATA:  Left lower quadrant abdominal pain. EXAM: CT ABDOMEN AND PELVIS WITHOUT CONTRAST TECHNIQUE: Multidetector CT imaging of the abdomen and pelvis was performed following the standard protocol without IV contrast. RADIATION DOSE REDUCTION: This exam was performed according to the departmental dose-optimization program which includes automated exposure control, adjustment of the mA and/or kV according to patient size and/or use of iterative reconstruction technique. COMPARISON:  December 11, 2021. FINDINGS: Lower chest: No acute abnormality. Hepatobiliary: No focal liver abnormality is seen. No gallstones, gallbladder wall thickening, or biliary dilatation. Pancreas: Unremarkable. No pancreatic ductal dilatation or surrounding inflammatory changes. Spleen: Normal in size without focal abnormality. Adrenals/Urinary Tract: Adrenal glands appear normal. Small nonobstructive left renal calculus is noted. No hydronephrosis or renal obstruction is noted. Urinary bladder is unremarkable. Stomach/Bowel: The stomach appears normal. Status post appendectomy. There is no evidence of bowel obstruction or inflammation. Vascular/Lymphatic: Status post aortobifemoral bypass graft placement. No adenopathy is noted. Reproductive: Uterus and bilateral adnexa are unremarkable. Other: No abdominal wall hernia or abnormality. No abdominopelvic ascites. Musculoskeletal: No acute or significant osseous findings. IMPRESSION: Small nonobstructive left renal calculus. No acute abnormality seen in the abdomen or pelvis. Status post aortobifemoral bypass graft placement. Aortic Atherosclerosis (ICD10-I70.0). Electronically Signed   By: Marijo Conception M.D.   On: 12/15/2022 12:59    EKG: Independently reviewed.  Sinus rhythm, LVH, chronic ST changes on multiple leads likely secondary to LVH.  Assessment/Plan Principal Problem:   AKI (acute kidney injury) ([place]) Active Problems:   Hypokalemia  (please populate well  all problems here in Problem List. (For example, if patient is on BP meds at home  and you resume or decide to hold them, it is a problem that needs to be her. Same for CAD, COPD, HLD and so on)  Severe hypokalemia -Secondary to acute diarrhea, and diarrhea appears to be resolved since yesterday -Continue aggressive p.o. and IV replacement -Recheck K level tonight and tomorrow morning -Check magnesium and phosphorus level  AKI -Secondary to volume contraction from severe diarrhea -Aggressive fluid resuscitation as clinically patient still appears to be volume contracted at this point -CT abdomen pelvis referring  Chronic severe hypoalbuminemia -Chronic, unknown etiology patient already on protein supplement, recommend outpatient follow-up with GI.  UA pending to rule out proteinuria  Severe diarrhea -Resolved, likely secondary to antibiotics, still having severe volume contraction but otherwise no significant symptoms signs of sepsis, no high WBC count or fever.  Hold off CT of study -Monitor.  Further symptoms  HTN -Hold off all home BP meds, -Start as needed hydralazine  Acute ambulation dysfunction -Secondary to severe hypokalemia -IV hydration and K replacement as above, PT evaluation tomorrow  Sinusitis -Continue fluticasone spray  COPD -Stable, continue Ellipta  GERD, peripheral neuropathy -No acute concerns   DVT prophylaxis: Heparin subcu Code Status: Full code Family Communication: Granddaughter at bedside Disposition Plan: Expect less than 2 midnight hospital stay Consults called: None Admission status: Tele observation   Lequita Halt MD [facility] Pager [phone]  12/15/2022, 1:57 PM

## 2022-12-15 NOTE — Progress Notes (Signed)
IV consult put in for pt.

## 2022-12-15 NOTE — ED Notes (Signed)
ED TO INPATIENT HANDOFF REPORT  ED Nurse Name and Phone #: Heath Lark [phone]  S Name/Age/Gender Elizabeth [name] 82 y.o. female Room/Bed: 026C/026C  Code Status   Code Status: Full Code  Home/SNF/Other Home Patient oriented to: self, place, time, and situation Is this baseline? Yes   Triage Complete: Triage complete  Chief Complaint AKI (acute kidney injury) ([place]) [N17.9]  Triage Note Pt c/o weakness started in mid January and has gotten progressively worse. Pt c/o abd pain, leg pain bilat and Hax39mo Pt is slow to answer questions.   Allergies Allergies  Allergen Reactions   Alendronate Sodium     loose teeth   Azithromycin     Other reaction(s): N,V,D..Marland KitchenMarland Kitchen 1 time occurence?   Chantix [Varenicline]     Other reaction(s): N/V/D and flu sxs   Other Other (See Comments)    Chlorexolone - unknown reaction   Pollen Extract     Allergic to tree pollens   Tegretol [Carbamazepine] Swelling   Lisinopril Rash   Tetracyclines & Related Rash    Level of Care/Admitting Diagnosis ED Disposition     ED Disposition  Admit   Condition  --   Comment  [id number]  Level of Care: Telemetry Medical [104]  [facility]:: No  Covid Evaluation: Asymptomatic - no recent exposure (last 10 days) testing not required  Diagnosis: AKI (acute kidney injury) ([facility] [[id number] Admitting Physician: ZLequita Halt[[id number] Attending Physician: ZLequita Halt[[id number]         B Medical/Surgery History Past Medical History:  Diagnosis Date   Abdominal cramping    possibly irritable bowel syndrome versus adhesions secondary to multiple surgeries and abdominal inflammation and 2009 secondary to diverticulitis and diverticular phlegmon   Allergic rhinitis    Arthritis    Carotid artery disease ([facility]    [id number], < [id number] [id number]([facility])    Cataract    Chest pain syndrome 2004   with adenosine Cardiolite that was normal   Chronic hoarseness    COPD (chronic obstructive pulmonary disease) (HCC)    Dyspnea    GERD (gastroesophageal reflux disease)    Heart murmur    Mild AS, AI. AV pk grad 18.79 mmHg, mn grad 11.20 mmHg, AVA (VTI) 1.05 cm2 10/10/20, 3 year f/u rec ([facility])   History of kidney stones    History of renal calculi    Hypercholesterolemia    Hypertension    Incontinence    Cough incontinence   Mild depression    Neuromuscular disorder (HCC)    neuropathy bilateral lower extremities   Osteopenia    Peripheral vascular disease ([place]    with femoral and carotid bruits   PONV (postoperative nausea and vomiting)    Recurrent pancreatitis    Tobacco dependence    Vitamin D deficiency    Past Surgical History:  Procedure Laterality Date   ABDOMINAL AORTOGRAM W/LOWER EXTREMITY N/A 09/18/2021   Procedure: ABDOMINAL AORTOGRAM W/LOWER EXTREMITY;  Surgeon: RBroadus John MD;  [facility];  Service: Cardiovascular;  Laterality: N/A;   AORTA - BILATERAL FEMORAL ARTERY BYPASS GRAFT Bilateral 12/03/2021   Procedure: AORTOBIFEMORAL BYPASS GRAFT USING A 14 X 746mHEMASHIELD GOLD GRAFT;  Surgeon: RoBroadus JohnMD;  Location: [facility] Service: Vascular;  Laterality: Bilateral;   APPENDECTOMY  BIOPSY  02/24/2022   Procedure: BIOPSY;  Surgeon: Rush Landmark Telford Nab., MD;  Location: [facility];  Service: Gastroenterology;;   BREAST BIOPSY     CATARACT EXTRACTION, BILATERAL     COLON SURGERY     2009   COSMETIC SURGERY     on neck and eyes   DILATION AND CURETTAGE OF UTERUS     ESOPHAGOGASTRODUODENOSCOPY (EGD) WITH PROPOFOL N/A 02/24/2022   Procedure: ESOPHAGOGASTRODUODENOSCOPY (EGD) WITH PROPOFOL;  Surgeon: Irving Copas., MD;  Location: [facility];  Service: Gastroenterology;  Laterality: N/A;   EUS N/A 02/24/2022   Procedure: UPPER ENDOSCOPIC ULTRASOUND (EUS) RADIAL;  Surgeon:  Irving Copas., MD;  Location: [facility];  Service: Gastroenterology;  Laterality: N/A;   EYE SURGERY     2014   FEMUR SURGERY  12/2007   left femur surgery--for femoral neck fracture status post fall    FRACTURE SURGERY     broke left leg and left arm- 2012   HOT HEMOSTASIS N/A 02/24/2022   Procedure: HOT HEMOSTASIS (ARGON PLASMA COAGULATION/BICAP);  Surgeon: Irving Copas., MD;  Location: [facility];  Service: Gastroenterology;  Laterality: N/A;   LIPOSUCTION     LUMBAR LAMINECTOMY/DECOMPRESSION MICRODISCECTOMY N/A 11/12/2020   Procedure: LAMINECTOMY AND FORAMINOTOMY Lumbar three four, Lumbar four five, Lumbar Five Sacral one;  Surgeon: Newman Pies, MD;  Location: [place];  Service: Neurosurgery;  Laterality: N/A;   OTHER SURGICAL HISTORY     sigmoid colectomy   SHOULDER SURGERY     arthroscopic shoulder surgery    TONSILLECTOMY     TUBAL LIGATION       A IV Location/Drains/Wounds Patient Lines/Drains/Airways Status     Active Line/Drains/Airways     Name Placement date Placement time Site Days   Peripheral IV 12/15/22 20 G Right Antecubital 12/15/22  1159  Antecubital  less than 1            Intake/Output Last 24 hours No intake or output data in the 24 hours ending 12/15/22 1446  Labs/Imaging Results for orders placed or performed during the hospital encounter of 12/15/22 (from the past 48 hour(s))  Basic metabolic panel     Status: Abnormal   Collection Time: 12/15/22 11:27 AM  Result Value Ref Range   Sodium 130 (L) 135 - 145 mmol/L   Potassium 2.5 (LL) 3.5 - 5.1 mmol/L    Comment: RESULTS VERIFIED BY REPEAT TESTING CRITICAL RESULT CALLED TO, READ BACK BY AND VERIFIED WITH C. Cowlington, RN 1206 12/15/22 L. KLAR    Chloride 90 (L) 98 - 111 mmol/L   CO2 23 22 - 32 mmol/L   Glucose, Bld 178 (H) 70 - 99 mg/dL    Comment: Glucose reference range applies only to samples taken after fasting for at least 8 hours.   BUN 54 (H) 8 - 23 mg/dL    Creatinine, Ser 2.52 (H) 0.44 - 1.00 mg/dL   Calcium 9.1 8.9 - 10.3 mg/dL   GFR, Estimated 19 (L) >60 mL/min    Comment: (NOTE) Calculated using the CKD-EPI Creatinine Equation (2021)    Anion gap 17 (H) 5 - 15    Comment: Performed at [facility] [address] [place] [address]  CBC     Status: Abnormal   Collection Time: 12/15/22 11:27 AM  Result Value Ref Range   WBC 10.7 (H) 4.0 - 10.5 K/uL   RBC 3.85 (L) 3.87 - 5.11 MIL/uL   Hemoglobin 11.7 (L) 12.0 - 15.0 g/dL  HCT 34.6 (L) 36.0 - 46.0 %   MCV 89.9 80.0 - 100.0 fL   MCH 30.4 26.0 - 34.0 pg   MCHC 33.8 30.0 - 36.0 g/dL   RDW 13.5 11.5 - 15.5 %   Platelets 317 150 - 400 K/uL   nRBC 0.0 0.0 - 0.2 %    Comment: Performed at [facility], [place] [address] [place], [place] [address]  Lipase, blood     Status: Abnormal   Collection Time: 12/15/22 11:27 AM  Result Value Ref Range   Lipase 61 (H) 11 - 51 U/L    Comment: Performed at [facility] [address], [place], [place] [address]  Hepatic function panel     Status: Abnormal   Collection Time: 12/15/22 11:27 AM  Result Value Ref Range   Total Protein 6.7 6.5 - 8.1 g/dL   Albumin 2.9 (L) 3.5 - 5.0 g/dL   AST 47 (H) 15 - 41 U/L   ALT 25 0 - 44 U/L   Alkaline Phosphatase 64 38 - 126 U/L   Total Bilirubin 0.6 0.3 - 1.2 mg/dL   Bilirubin, Direct <0.1 0.0 - 0.2 mg/dL   Indirect Bilirubin NOT CALCULATED 0.3 - 0.9 mg/dL    Comment: Performed at [facility] [address] [place], [place] [address]  CK     Status: Abnormal   Collection Time: 12/15/22 11:27 AM  Result Value Ref Range   Total CK 314 (H) 38 - 234 U/L    Comment: Performed at [facility], [place] [address], [place], [place] [address]  CBG monitoring, ED     Status: Abnormal   Collection Time: 12/15/22 11:36 AM  Result Value Ref Range   Glucose-Capillary 151 (H) 70 - 99 mg/dL    Comment: Glucose reference range applies only to samples taken after fasting for at least  8 hours.  Resp panel by RT-PCR (RSV, Flu A&B, Covid) Anterior Nasal Swab     Status: None   Collection Time: 12/15/22 11:38 AM   Specimen: Anterior Nasal Swab  Result Value Ref Range   SARS Coronavirus 2 by RT PCR NEGATIVE NEGATIVE   Influenza A by PCR NEGATIVE NEGATIVE   Influenza B by PCR NEGATIVE NEGATIVE    Comment: (NOTE) The Xpert Xpress SARS-CoV-2/FLU/RSV plus assay is intended as an aid in the diagnosis of influenza from Nasopharyngeal swab specimens and should not be used as a sole basis for treatment. Nasal washings and aspirates are unacceptable for Xpert Xpress SARS-CoV-2/FLU/RSV testing.  Fact Sheet for Patients: EntrepreneurPulse.com.au  Fact Sheet for Healthcare Providers: IncredibleEmployment.be  This test is not yet approved or cleared by the [place] FDA and has been authorized for detection and/or diagnosis of SARS-CoV-2 by FDA under an Emergency Use Authorization (EUA). This EUA will remain in effect (meaning this test can be used) for the duration of the COVID-19 declaration under Section 564(b)(1) of the Act, 21 U.S.C. section 360bbb-3(b)(1), unless the authorization is terminated or revoked.     Resp Syncytial Virus by PCR NEGATIVE NEGATIVE    Comment: (NOTE) Fact Sheet for Patients: EntrepreneurPulse.com.au  Fact Sheet for Healthcare Providers: IncredibleEmployment.be  This test is not yet approved or cleared by the [place] FDA and has been authorized for detection and/or diagnosis of SARS-CoV-2 by FDA under an Emergency Use Authorization (EUA). This EUA will remain in effect (meaning this test can be used) for the duration of the COVID-19 declaration under Section 564(b)(1)  of the Act, 21 U.S.C. section 360bbb-3(b)(1), unless the authorization is terminated or revoked.  Performed at [facility], [place] [address] [place], [place] [address]    DG Chest 1  View  Result Date: 12/15/2022 CLINICAL DATA:  Acute kidney injury EXAM: CHEST  1 VIEW COMPARISON:  12/04/2021 FINDINGS: Hyperinflation. Mild interstitial prominence. No consolidation, pneumothorax or effusion. Emphysematous lung changes. Overlapping cardiac leads. Artifact from the patient's clothing. IMPRESSION: Hyperinflation with likely chronic interstitial changes. No consolidation or effusion Electronically Signed   By: Jill Side M.D.   On: 12/15/2022 14:26   CT ABDOMEN PELVIS WO CONTRAST  Result Date: 12/15/2022 CLINICAL DATA:  Left lower quadrant abdominal pain. EXAM: CT ABDOMEN AND PELVIS WITHOUT CONTRAST TECHNIQUE: Multidetector CT imaging of the abdomen and pelvis was performed following the standard protocol without IV contrast. RADIATION DOSE REDUCTION: This exam was performed according to the departmental dose-optimization program which includes automated exposure control, adjustment of the mA and/or kV according to patient size and/or use of iterative reconstruction technique. COMPARISON:  December 11, 2021. FINDINGS: Lower chest: No acute abnormality. Hepatobiliary: No focal liver abnormality is seen. No gallstones, gallbladder wall thickening, or biliary dilatation. Pancreas: Unremarkable. No pancreatic ductal dilatation or surrounding inflammatory changes. Spleen: Normal in size without focal abnormality. Adrenals/Urinary Tract: Adrenal glands appear normal. Small nonobstructive left renal calculus is noted. No hydronephrosis or renal obstruction is noted. Urinary bladder is unremarkable. Stomach/Bowel: The stomach appears normal. Status post appendectomy. There is no evidence of bowel obstruction or inflammation. Vascular/Lymphatic: Status post aortobifemoral bypass graft placement. No adenopathy is noted. Reproductive: Uterus and bilateral adnexa are unremarkable. Other: No abdominal wall hernia or abnormality. No abdominopelvic ascites. Musculoskeletal: No acute or significant osseous  findings. IMPRESSION: Small nonobstructive left renal calculus. No acute abnormality seen in the abdomen or pelvis. Status post aortobifemoral bypass graft placement. Aortic Atherosclerosis (ICD10-I70.0). Electronically Signed   By: Marijo Conception M.D.   On: 12/15/2022 12:59    Pending Labs Unresulted Labs (From admission, onward)     Start     Ordered   12/16/22 [id number]  Basic metabolic panel  Tomorrow morning,   R        12/15/22 1356   12/16/22 0500  CBC  Tomorrow morning,   R        12/15/22 1356   12/15/22 [id number]  Basic metabolic panel  Once,   STAT        12/15/22 1352   12/15/22 1357  Magnesium  Add-on,   AD        12/15/22 1356   12/15/22 1353  Phosphorus  Add-on,   AD        12/15/22 1352   12/15/22 1246  Lactic acid, plasma  Now then every 2 hours,   R (with STAT occurrences)      12/15/22 1245   12/15/22 1109  Urinalysis, Routine w reflex microscopic -Urine, Clean Catch  Once,   URGENT       Question:  Specimen Source  Answer:  Urine, Clean Catch   12/15/22 1108            Vitals/Pain Today's Vitals   12/15/22 1105 12/15/22 1105 12/15/22 1106 12/15/22 1315  BP:  (!) 98/48  121/63  Pulse:  92  82  Resp:  (!) 22  (!) 24  Temp:  97.8 F (36.6 C)    SpO2:  97%  100%  Weight:   48.1 kg   Height:   5' (  1.524 m)   PainSc: 0-No pain       Isolation Precautions Airborne and Contact precautions  Medications Medications  potassium chloride SA (KLOR-CON M) CR tablet 40 mEq (has no administration in time range)  aspirin EC tablet 81 mg (has no administration in time range)  rosuvastatin (CRESTOR) tablet 40 mg (has no administration in time range)  pantoprazole (PROTONIX) EC tablet 20 mg (has no administration in time range)  gabapentin (NEURONTIN) capsule 100-200 mg (has no administration in time range)  feeding supplement (ENSURE ENLIVE / ENSURE PLUS) liquid 237 mL (has no administration in time range)  fluticasone (FLONASE) 50 MCG/ACT nasal spray 2 spray (has no  administration in time range)  umeclidinium bromide (INCRUSE ELLIPTA) 62.5 MCG/ACT 1 puff (1 puff Inhalation Not Given 12/15/22 1445)  heparin injection 5,000 Units (has no administration in time range)  acetaminophen (TYLENOL) tablet 650 mg (has no administration in time range)    Or  acetaminophen (TYLENOL) suppository 650 mg (has no administration in time range)  ondansetron (ZOFRAN) tablet 4 mg (has no administration in time range)    Or  ondansetron (ZOFRAN) injection 4 mg (has no administration in time range)  hydrALAZINE (APRESOLINE) tablet 10 mg (has no administration in time range)  0.9 % NaCl with KCl 20 mEq/ L  infusion (has no administration in time range)  lactated ringers bolus 1,000 mL (has no administration in time range)  lactated ringers bolus 1,000 mL (1,000 mLs Intravenous New Bag/Given 12/15/22 1159)  ondansetron (ZOFRAN) injection 4 mg (4 mg Intravenous Given 12/15/22 1216)  potassium chloride SA (KLOR-CON M) CR tablet 40 mEq (40 mEq Oral Given 12/15/22 1215)  magnesium oxide (MAG-OX) tablet 800 mg (800 mg Oral Given 12/15/22 1215)  potassium chloride 10 mEq in 100 mL IVPB (10 mEq Intravenous New Bag/Given 12/15/22 1302)    Mobility walks with person assist     Focused Assessments  R Recommendations: See Admitting Provider Note  Report given to:   Additional Notes:

## 2022-12-16 ENCOUNTER — Encounter (HOSPITAL_COMMUNITY): Payer: Self-pay | Admitting: Family Medicine

## 2022-12-16 DIAGNOSIS — Z79899 Other long term (current) drug therapy: Secondary | ICD-10-CM | POA: Diagnosis not present

## 2022-12-16 DIAGNOSIS — I739 Peripheral vascular disease, unspecified: Secondary | ICD-10-CM | POA: Diagnosis present

## 2022-12-16 DIAGNOSIS — E8809 Other disorders of plasma-protein metabolism, not elsewhere classified: Secondary | ICD-10-CM | POA: Diagnosis present

## 2022-12-16 DIAGNOSIS — G629 Polyneuropathy, unspecified: Secondary | ICD-10-CM | POA: Diagnosis present

## 2022-12-16 DIAGNOSIS — Z87891 Personal history of nicotine dependence: Secondary | ICD-10-CM | POA: Diagnosis not present

## 2022-12-16 DIAGNOSIS — K219 Gastro-esophageal reflux disease without esophagitis: Secondary | ICD-10-CM | POA: Diagnosis present

## 2022-12-16 DIAGNOSIS — Z7982 Long term (current) use of aspirin: Secondary | ICD-10-CM | POA: Diagnosis not present

## 2022-12-16 DIAGNOSIS — E7800 Pure hypercholesterolemia, unspecified: Secondary | ICD-10-CM | POA: Diagnosis present

## 2022-12-16 DIAGNOSIS — A084 Viral intestinal infection, unspecified: Secondary | ICD-10-CM | POA: Diagnosis present

## 2022-12-16 DIAGNOSIS — E559 Vitamin D deficiency, unspecified: Secondary | ICD-10-CM | POA: Diagnosis present

## 2022-12-16 DIAGNOSIS — E876 Hypokalemia: Secondary | ICD-10-CM | POA: Diagnosis present

## 2022-12-16 DIAGNOSIS — Z1152 Encounter for screening for COVID-19: Secondary | ICD-10-CM | POA: Diagnosis not present

## 2022-12-16 DIAGNOSIS — R5381 Other malaise: Secondary | ICD-10-CM | POA: Diagnosis present

## 2022-12-16 DIAGNOSIS — Z7952 Long term (current) use of systemic steroids: Secondary | ICD-10-CM | POA: Diagnosis not present

## 2022-12-16 DIAGNOSIS — J449 Chronic obstructive pulmonary disease, unspecified: Secondary | ICD-10-CM | POA: Diagnosis present

## 2022-12-16 DIAGNOSIS — E860 Dehydration: Principal | ICD-10-CM | POA: Insufficient documentation

## 2022-12-16 DIAGNOSIS — E871 Hypo-osmolality and hyponatremia: Secondary | ICD-10-CM | POA: Diagnosis present

## 2022-12-16 DIAGNOSIS — I959 Hypotension, unspecified: Secondary | ICD-10-CM | POA: Diagnosis present

## 2022-12-16 DIAGNOSIS — R531 Weakness: Secondary | ICD-10-CM

## 2022-12-16 DIAGNOSIS — Z823 Family history of stroke: Secondary | ICD-10-CM | POA: Diagnosis not present

## 2022-12-16 DIAGNOSIS — I10 Essential (primary) hypertension: Secondary | ICD-10-CM | POA: Diagnosis present

## 2022-12-16 DIAGNOSIS — N179 Acute kidney failure, unspecified: Secondary | ICD-10-CM | POA: Diagnosis present

## 2022-12-16 DIAGNOSIS — Z8249 Family history of ischemic heart disease and other diseases of the circulatory system: Secondary | ICD-10-CM | POA: Diagnosis not present

## 2022-12-16 LAB — BASIC METABOLIC PANEL
Anion gap: 10 (ref 5–15)
BUN: 45 mg/dL — ABNORMAL HIGH (ref 8–23)
CO2: 22 mmol/L (ref 22–32)
Calcium: 8.4 mg/dL — ABNORMAL LOW (ref 8.9–10.3)
Chloride: 103 mmol/L (ref 98–111)
Creatinine, Ser: 2.06 mg/dL — ABNORMAL HIGH (ref 0.44–1.00)
GFR, Estimated: 24 mL/min — ABNORMAL LOW (ref >60)
Glucose, Bld: 132 mg/dL — ABNORMAL HIGH (ref 70–99)
Potassium: 4.1 mmol/L (ref 3.5–5.1)
Sodium: 135 mmol/L (ref 135–145)

## 2022-12-16 LAB — CBC
HCT: 24.3 % — ABNORMAL LOW (ref 36.0–46.0)
Hemoglobin: 8.6 g/dL — ABNORMAL LOW (ref 12.0–15.0)
MCH: 30.9 pg (ref 26.0–34.0)
MCHC: 35.4 g/dL (ref 30.0–36.0)
MCV: 87.4 fL (ref 80.0–100.0)
Platelets: 192 10*3/uL (ref 150–400)
RBC: 2.78 MIL/uL — ABNORMAL LOW (ref 3.87–5.11)
RDW: 13.6 % (ref 11.5–15.5)
WBC: 8.3 10*3/uL (ref 4.0–10.5)
nRBC: 0 % (ref 0.0–0.2)

## 2022-12-16 MED ORDER — SODIUM CHLORIDE 0.9 % IV SOLN: INTRAVENOUS | Status: DC

## 2022-12-16 NOTE — Evaluation (Signed)
Physical Therapy Evaluation Patient Details Name: Elizabeth [name] MRN: [id number] DOB: Apr 16, 1941 Today's Date: 12/16/2022  History of Present Illness  82 y/o female who presented with generalized weakness after having prolonged days of diarrhea. Pt with AKI, hypokalemia. PMH: aorto bifemoral bypass with aortic endarterectomy and bilateral common femoral endarterectomies, COPD, HTN, GERD, PVD, pancreatitis, back surgery  Clinical Impression  Pt admitted with above diagnosis. Pt received in bed, feeling fatigued and weak per her report. Pt with hypotension throughout though not orthostatic. Min A for transfer and did not push progression of ambulation today given how she was feeling. Currently recommending HHPT upon d/c.  Pt currently with functional limitations due to the deficits listed below (see PT Problem List). Pt will benefit from skilled PT to increase their independence and safety with mobility to allow discharge to the venue listed below.          Recommendations for follow up therapy are one component of a multi-disciplinary discharge planning process, led by the attending physician.  Recommendations may be updated based on patient status, additional functional criteria and insurance authorization.  Follow Up Recommendations Home health PT      Assistance Recommended at Discharge Intermittent Supervision/Assistance  Patient can return home with the following  A little help with walking and/or transfers;A little help with bathing/dressing/bathroom;Assistance with cooking/housework    Equipment Recommendations None recommended by PT  Recommendations for Other Services       Functional Status Assessment Patient has had a recent decline in their functional status and demonstrates the ability to make significant improvements in function in a reasonable and predictable amount of time.     Precautions / Restrictions Precautions Precautions: Fall Restrictions Weight Bearing  Restrictions: No      Mobility  Bed Mobility Overal bed mobility: Modified Independent             General bed mobility comments: slow but no assist needed    Transfers Overall transfer level: Needs assistance Equipment used: 1 person hand held assist Transfers: Sit to/from Stand, Bed to chair/wheelchair/BSC Sit to Stand: Min assist   Step pivot transfers: Min assist       General transfer comment: min A to steady, all mobility took great effort for pt today    Ambulation/Gait Ambulation/Gait assistance: Min assist Gait Distance (Feet): 5 Feet Assistive device: 1 person hand held assist Gait Pattern/deviations: Step-through pattern Gait velocity: decreased Gait velocity interpretation: <1.31 ft/sec, indicative of household ambulator   General Gait Details: limited distance today due to hypotension and fatigue  Stairs            Wheelchair Mobility    Modified Rankin (Stroke Patients Only)       Balance Overall balance assessment: Mild deficits observed, not formally tested                                           Pertinent Vitals/Pain Pain Assessment Pain Assessment: No/denies pain    Home Living Family/patient expects to be discharged to:: Private residence Living Arrangements: Alone Available Help at Discharge: Available 24 hours/day Type of Home: House Home Access: Level entry       Home Layout: One level Home Equipment: Conservation officer, nature (2 wheels);Cane - single point;Shower seat;Grab bars - toilet;Grab bars - tub/shower;Rollator (4 wheels);Hand held shower head;Wheelchair - manual      Prior Function Prior Level of Function :  Independent/Modified Independent;Driving;Working/employed             Mobility Comments: no use of AD, drives, works, 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 ADLs Comments: had used RW previously but not PTA     Hand Dominance   Dominant Hand: Right    Extremity/Trunk Assessment    Upper Extremity Assessment Upper Extremity Assessment: Generalized weakness    Lower Extremity Assessment Lower Extremity Assessment: Generalized weakness    Cervical / Trunk Assessment Cervical / Trunk Assessment: Normal  Communication   Communication: No difficulties  Cognition Arousal/Alertness: Awake/alert Behavior During Therapy: WFL for tasks assessed/performed Overall Cognitive Status: Within Functional Limits for tasks assessed                                 General Comments: pt reports extreme fatigue        General Comments General comments (skin integrity, edema, etc.): pt elevated with standing but even in standing BP only 103/46 from 90/46    Exercises     Assessment/Plan    PT Assessment Patient needs continued PT services  PT Problem List Decreased strength;Decreased activity tolerance;Decreased balance;Decreased mobility;Cardiopulmonary status limiting activity       PT Treatment Interventions DME instruction;Gait training;Stair training;Functional mobility training;Therapeutic activities;Therapeutic exercise;Balance training;Patient/family education    PT Goals (Current goals can be found in the Care Plan section)  Acute Rehab PT Goals Patient Stated Goal: feel better PT Goal Formulation: With patient Time For Goal Achievement: 12/30/22 Potential to Achieve Goals: Good    Frequency Min 3X/week     Co-evaluation               AM-PAC PT "6 Clicks" Mobility  Outcome Measure Help needed turning from your back to your side while in a flat bed without using bedrails?: None Help needed moving from lying on your back to sitting on the side of a flat bed without using bedrails?: A Little Help needed moving to and from a bed to a chair (including a wheelchair)?: A Little Help needed standing up from a chair using your arms (e.g., wheelchair or bedside chair)?: A Little Help needed to walk in hospital room?: A Little Help needed  climbing 3-5 steps with a railing? : A Lot 6 Click Score: 18    End of Session Equipment Utilized During Treatment: Gait belt Activity Tolerance: Patient limited by fatigue Patient left: in bed;with call bell/phone within reach;with bed alarm set Nurse Communication: Mobility status PT Visit Diagnosis: Muscle weakness (generalized) (M62.81);Difficulty in walking, not elsewhere classified (R26.2);Unsteadiness on feet (R26.81)    Time: [id number] PT Time Calculation (min) (ACUTE ONLY): 17 min   Charges:   PT Evaluation $PT Eval Moderate Complexity: [place] chat preferred Office [place] 12/16/2022, 5:35 PM

## 2022-12-16 NOTE — Evaluation (Addendum)
Occupational Therapy Evaluation Patient Details Name: Elizabeth [name] MRN: [id number] DOB: 05-23-1941 Today's Date: 12/16/2022   History of Present Illness 82 y/o female who presented with generalized weakness after having prolonged days of diarrhea. Pt with AKI, hypokalemia. 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 Pt reports beginning to feel better. Pt reports dizziness with mobility initially, which improved after mobilizing. BP supine 96/46; sitting 96/51; standing 103/48. Family will be able to assist after DC. Given generalized weakness, recommend initial frequent S - pt verbalized understanding. Discussed with PT who plans to see pt after she receives more fluids. No further OT needs.      Recommendations for follow up therapy are one component of a multi-disciplinary discharge planning process, led by the attending physician.  Recommendations may be updated based on patient status, additional functional criteria and insurance authorization.   Follow Up Recommendations  No OT follow up     Assistance Recommended at Discharge Frequent or constant Supervision/Assistance (initially; pt reports family can assist as needed)  Patient can return home with the following Assist for transportation;A little help with bathing/dressing/bathroom    Functional Status Assessment  Patient has not had a recent decline in their functional status  Equipment Recommendations  None recommended by OT    Recommendations for Other Services       Precautions / Restrictions Precautions Precautions: Fall      Mobility Bed Mobility Overal bed mobility: Modified Independent                  Transfers Overall transfer level: Needs assistance                         Balance Overall balance assessment: Mild deficits observed, not formally tested                                         ADL either performed or assessed with clinical judgement   ADL Overall ADL's : Needs assistance/impaired                                     Functional mobility during ADLs: Supervision/safety General ADL Comments: overall set up/close S with ADL tasks Educated on strategies to reduce risk of falls     Vision Baseline Vision/History: 1 Wears glasses (for driving)       Perception     Praxis      Pertinent Vitals/Pain Pain Assessment Pain Assessment: No/denies pain     Hand Dominance Right   Extremity/Trunk Assessment         Cervical / Trunk Assessment Cervical / Trunk Assessment: Back Surgery (2022; hx of back problems - functional)   Communication Communication Communication: No difficulties   Cognition Arousal/Alertness: Awake/alert Behavior During Therapy: WFL for tasks assessed/performed Overall Cognitive Status: Within Functional Limits for tasks assessed                                       General Comments  96/46 supine; sitting  96/51; standing 103/48    Exercises     Shoulder Instructions      Home Living Family/patient expects to be discharged to:: Private residence Living Arrangements: Alone Available Help at Discharge: Available 24 hours/day Type of Home: House Home Access: Level entry     Home Layout: One level     Bathroom Shower/Tub: Door   [employer] Toilet: Handicapped height Bathroom Accessibility: Yes How Accessible: Accessible via wheelchair;Accessible via walker Home Equipment: [place] (2 wheels);Cane - single point;Shower seat;Grab bars - toilet;Grab bars - tub/shower;Rollator (4 wheels);Hand held shower head;Wheelchair - manual          Prior Functioning/Environment Prior Level of Function : Independent/Modified  Independent;Driving;Working/employed               ADLs Comments: had used RW previously but not PTA        OT Problem List:        OT Treatment/Interventions:      OT Goals(Current goals can be found in the care plan section) Acute Rehab OT Goals Patient Stated Goal: to go home OT Goal Formulation: All assessment and education complete, DC therapy  OT Frequency:      Co-evaluation              AM-PAC OT "6 Clicks" Daily Activity     Outcome Measure Help from another person eating meals?: None Help from another person taking care of personal grooming?: None Help from another person toileting, which includes using toliet, bedpan, or urinal?: A Little Help from another person bathing (including washing, rinsing, drying)?: A Little Help from another person to put on and taking off regular upper body clothing?: A Little Help from another person to put on and taking off regular lower body clothing?: A Little 6 Click Score: 20   End of Session Equipment Utilized During Treatment: Gait belt Nurse Communication: Mobility status  Activity Tolerance: Patient tolerated treatment well Patient left: in bed;with call bell/phone within reach  OT Visit Diagnosis: Muscle weakness (generalized) (M62.81)                Time: [id number] OT Time Calculation (min): 24 min Charges:  OT General Charges $OT Visit: 1 Visit OT Evaluation $OT Eval Low Complexity: [place], OT/L   Acute OT Clinical Specialist Acute Rehabilitation Services Pager [phone] Office [phone]   [facility] 12/16/2022, 10:58 AM

## 2022-12-16 NOTE — Progress Notes (Signed)
PROGRESS NOTE    Elizabeth [name]  [id number] DOB: [birth year] DOA: 12/15/2022 PCP: Charlane Ferretti, MD   Brief Narrative:  HPI: Elizabeth [name] is a 82 y.o. female with medical history significant of HTN, COPD, multiple OA, COPD, chronic kidney stones, GERD, peripheral neuropathy, presented with worsening of generalized weakness.   Developed severe sinusitis with runny nose and congestion 3 weeks ago completed 1 round of 7 days doxycycline with no significant improvement, 2 weeks ago she was started on Augmentin, soon she started to develop severe watery diarrhea multiple bouts a day without abdominal pain had 1 day of chills but no fever.  And 7 days ago Augmentin discontinued and patient was started on 5 days of steroid which she completed.  After stopping Augmentin she continued to have watery diarrhea until yesterday and became increasingly weak.  Denies any abdominal pain no back pain.  She does report urine output has significant decreased.  She reported her sinus symptoms finally controlled with fluticasone nasal spray   ED Course: Blood pressure borderline low systolic lower 0000000, no tachycardia.  Blood work showed AKI creatinine 2.3 compared to baseline 0.7, K2.5, bicarb 23, WBC 10.7, hemoglobin 11.7 platelet 317   CT abdominal pelvis without contrast showed nonobstructive bilateral kidney stones otherwise no significant acute findings.  Patient was given IV bolus x 1 and p.o. and IV Kcl.  Assessment & Plan:   Principal Problem:   AKI (acute kidney injury) ([place]) Active Problems:   COPD (chronic obstructive pulmonary disease) ([place])   Essential hypertension   Hypokalemia   Dehydration  Dehydration/AKI/diarrhea: Patient came in with dehydration due to diarrhea.  Patient has not had any bowel movement since admission.  Creatinine upon presentation was 2.52 and now 2.06.  Still looks clinically dehydrated.  Will continue IV fluids for another day and repeat labs in the morning.  She is also  dizzy, but OT when she worked with them.  Diarrhea was likely antibiotic related which has improved.  Severe hypokalemia: Resolved.  Hypertension: Blood pressure on the low normal side, patient dizzy, continue to hold antihypertensives.  Generalized weakness: Secondary to diarrhea and severe hypokalemia.  Seen by OT, they recommend home health.  PT to assess patient.  Chronic severe hypoalbuminemia: Encouraged oral intake.  COPD: Stable.  Continue home medications.  DVT prophylaxis: heparin injection 5,000 Units Start: 12/15/22 1400   Code Status: Full Code  Family Communication:  None present at bedside.  Plan of care discussed with patient in length and he/she verbalized understanding and agreed with it.  Status is: Observation The patient will require care spanning > 2 midnights and should be moved to inpatient because: Requires another day of IV hydration.   Estimated body mass index is 20.71 kg/m as calculated from the following:   Height as of this encounter: 5' (1.524 m).   Weight as of this encounter: 48.1 kg.    Nutritional Assessment: Body mass index is 20.71 kg/m.Marland Kitchen Seen by dietician.  I agree with the assessment and plan as outlined below: Nutrition Status:        . Skin Assessment: I have examined the patient's skin and I agree with the wound assessment as performed by the wound care RN as outlined below:    Consultants:  None  Procedures:  None  Antimicrobials:  Anti-infectives (From admission, onward)    None         Subjective: Patient seen and examined.  She complains of generalized weakness.  No other  complaint.  Objective: Vitals:   12/16/22 0340 12/16/22 0610 12/16/22 0804 12/16/22 0816  BP: (!) 73/35 (!) 88/48  (!) 100/42  Pulse: 75 65  81  Resp:  16  18  Temp: 98.5 F (36.9 C) 98.6 F (37 C)  97.9 F (36.6 C)  TempSrc: Oral Oral  Oral  SpO2: 95% 95% 96% 96%  Weight:      Height:        Intake/Output Summary (Last 24 hours)  at 12/16/2022 1108 Last data filed at 12/16/2022 0308 Gross per 24 hour  Intake 886.09 ml  Output --  Net 886.09 ml   Filed Weights   12/15/22 1106  Weight: 48.1 kg    Examination:  General exam: Appears calm and comfortable  Respiratory system: Clear to auscultation. Respiratory effort normal. Cardiovascular system: S1 & S2 heard, RRR. No JVD, murmurs, rubs, gallops or clicks. No pedal edema. Gastrointestinal system: Abdomen is nondistended, soft and nontender. No organomegaly or masses felt. Normal bowel sounds heard. Central nervous system: Alert and oriented. No focal neurological deficits. Extremities: Symmetric 5 x 5 power. Skin: No rashes, lesions or ulcers Psychiatry: Judgement and insight appear normal. Mood & affect appropriate.    Data Reviewed: I have personally reviewed following labs and imaging studies  CBC: Recent Labs  Lab 12/15/22 1127 12/16/22 0401  WBC 10.7* 8.3  HGB 11.7* 8.6*  HCT 34.6* 24.3*  MCV 89.9 87.4  PLT 317 [id number]   Basic Metabolic Panel: Recent Labs  Lab 12/15/22 1127 12/15/22 1614 12/16/22 0534  NA 130* 131* 135  K 2.5* 2.9* 4.1  CL 90* 92* 103  CO2 23 27 22  $ GLUCOSE 178* 134* 132*  BUN 54* 51* 45*  CREATININE 2.52* 2.33* 2.06*  CALCIUM 9.1 8.7* 8.4*  MG  --  2.3  --   PHOS  --  3.2  --    GFR: Estimated Creatinine Clearance: 15.1 mL/min (A) (by C-G formula based on SCr of 2.06 mg/dL (H)). Liver Function Tests: Recent Labs  Lab 12/15/22 1127  AST 47*  ALT 25  ALKPHOS 64  BILITOT 0.6  PROT 6.7  ALBUMIN 2.9*   Recent Labs  Lab 12/15/22 1127  LIPASE 61*   No results for input(s): "AMMONIA" in the last 168 hours. Coagulation Profile: No results for input(s): "INR", "PROTIME" in the last 168 hours. Cardiac Enzymes: Recent Labs  Lab 12/15/22 1127  CKTOTAL 314*   BNP (last 3 results) No results for input(s): "PROBNP" in the last 8760 hours. HbA1C: No results for input(s): "HGBA1C" in the last 72  hours. CBG: Recent Labs  Lab 12/15/22 1136  GLUCAP 151*   Lipid Profile: No results for input(s): "CHOL", "HDL", "LDLCALC", "TRIG", "CHOLHDL", "LDLDIRECT" in the last 72 hours. Thyroid Function Tests: No results for input(s): "TSH", "T4TOTAL", "FREET4", "T3FREE", "THYROIDAB" in the last 72 hours. Anemia Panel: No results for input(s): "VITAMINB12", "FOLATE", "FERRITIN", "TIBC", "IRON", "RETICCTPCT" in the last 72 hours. Sepsis Labs: Recent Labs  Lab 12/15/22 1614 12/15/22 1913  LATICACIDVEN 1.3 0.9    Recent Results (from the past 240 hour(s))  Resp panel by RT-PCR (RSV, Flu A&B, Covid) Anterior Nasal Swab     Status: None   Collection Time: 12/15/22 11:38 AM   Specimen: Anterior Nasal Swab  Result Value Ref Range Status   SARS Coronavirus 2 by RT PCR NEGATIVE NEGATIVE Final   Influenza A by PCR NEGATIVE NEGATIVE Final   Influenza B by PCR NEGATIVE NEGATIVE Final    Comment: (  NOTE) The Xpert Xpress SARS-CoV-2/FLU/RSV plus assay is intended as an aid in the diagnosis of influenza from Nasopharyngeal swab specimens and should not be used as a sole basis for treatment. Nasal washings and aspirates are unacceptable for Xpert Xpress SARS-CoV-2/FLU/RSV testing.  Fact Sheet for Patients: EntrepreneurPulse.com.au  Fact Sheet for Healthcare Providers: IncredibleEmployment.be  This test is not yet approved or cleared by the [place] FDA and has been authorized for detection and/or diagnosis of SARS-CoV-2 by FDA under an Emergency Use Authorization (EUA). This EUA will remain in effect (meaning this test can be used) for the duration of the COVID-19 declaration under Section 564(b)(1) of the Act, 21 U.S.C. section 360bbb-3(b)(1), unless the authorization is terminated or revoked.     Resp Syncytial Virus by PCR NEGATIVE NEGATIVE Final    Comment: (NOTE) Fact Sheet for Patients: EntrepreneurPulse.com.au  Fact Sheet  for Healthcare Providers: IncredibleEmployment.be  This test is not yet approved or cleared by the [place] FDA and has been authorized for detection and/or diagnosis of SARS-CoV-2 by FDA under an Emergency Use Authorization (EUA). This EUA will remain in effect (meaning this test can be used) for the duration of the COVID-19 declaration under Section 564(b)(1) of the Act, 21 U.S.C. section 360bbb-3(b)(1), unless the authorization is terminated or revoked.  Performed at [facility], [place] [address], [place],  [address]      Radiology Studies: DG Chest 1 View  Result Date: 12/15/2022 CLINICAL DATA:  Acute kidney injury EXAM: CHEST  1 VIEW COMPARISON:  12/04/2021 FINDINGS: Hyperinflation. Mild interstitial prominence. No consolidation, pneumothorax or effusion. Emphysematous lung changes. Overlapping cardiac leads. Artifact from the patient's clothing. IMPRESSION: Hyperinflation with likely chronic interstitial changes. No consolidation or effusion Electronically Signed   By: Jill Side M.D.   On: 12/15/2022 14:26   CT ABDOMEN PELVIS WO CONTRAST  Result Date: 12/15/2022 CLINICAL DATA:  Left lower quadrant abdominal pain. EXAM: CT ABDOMEN AND PELVIS WITHOUT CONTRAST TECHNIQUE: Multidetector CT imaging of the abdomen and pelvis was performed following the standard protocol without IV contrast. RADIATION DOSE REDUCTION: This exam was performed according to the departmental dose-optimization program which includes automated exposure control, adjustment of the mA and/or kV according to patient size and/or use of iterative reconstruction technique. COMPARISON:  December 11, 2021. FINDINGS: Lower chest: No acute abnormality. Hepatobiliary: No focal liver abnormality is seen. No gallstones, gallbladder wall thickening, or biliary dilatation. Pancreas: Unremarkable. No pancreatic ductal dilatation or surrounding inflammatory changes. Spleen: Normal in size without  focal abnormality. Adrenals/Urinary Tract: Adrenal glands appear normal. Small nonobstructive left renal calculus is noted. No hydronephrosis or renal obstruction is noted. Urinary bladder is unremarkable. Stomach/Bowel: The stomach appears normal. Status post appendectomy. There is no evidence of bowel obstruction or inflammation. Vascular/Lymphatic: Status post aortobifemoral bypass graft placement. No adenopathy is noted. Reproductive: Uterus and bilateral adnexa are unremarkable. Other: No abdominal wall hernia or abnormality. No abdominopelvic ascites. Musculoskeletal: No acute or significant osseous findings. IMPRESSION: Small nonobstructive left renal calculus. No acute abnormality seen in the abdomen or pelvis. Status post aortobifemoral bypass graft placement. Aortic Atherosclerosis (ICD10-I70.0). Electronically Signed   By: Marijo Conception M.D.   On: 12/15/2022 12:59    Scheduled Meds:  aspirin EC  81 mg Oral QHS   feeding supplement  237 mL Oral TID BM   fluticasone  2 spray Each Nare Daily   gabapentin  100 mg Oral Daily   And   gabapentin  200 mg Oral QHS  heparin  5,000 Units Subcutaneous Q12H   pantoprazole  20 mg Oral Daily   rosuvastatin  40 mg Oral Daily   umeclidinium bromide  1 puff Inhalation Daily   Continuous Infusions:  0.9 % NaCl with KCl 20 mEq / L 100 mL/hr at 12/16/22 0847     LOS: 0 days   Darliss Cheney, MD [facility]  12/16/2022, 11:08 AM   *Please note that this is a verbal dictation therefore any spelling or grammatical errors are due to the "[place] [employer]" system interpretation.  Please page via [place] and do not message via secure chat for urgent patient care matters. Secure chat can be used for non urgent patient care matters.  How to contact the [facility] Attending or Consulting provider [place] or covering provider during after hours [place], for this patient?  Check the care team in [facility] and look for a) attending/consulting TRH provider listed  and b) the [facility] team listed. Page or secure chat [phone]. Log into [email or web address] and use [place]'s universal password to access. If you do not have the password, please contact the hospital operator. Locate the [facility] provider you are looking for under [facility] and page to a number that you can be directly reached. If you still have difficulty reaching the provider, please page the [facility] (Director on Call) for the Hospitalists listed on amion for assistance.

## 2022-12-16 NOTE — TOC CM/SW Note (Signed)
  Transition of Care (TOC) Screening Note   Patient Details  Name: Elizabeth [name] Date of Birth: [birth year]    Transition of Care Department [facility]) has reviewed patient and no TOC needs have been identified at this time. We will continue to monitor patient advancement through interdisciplinary progression rounds. If new patient transition needs arise, please place a TOC consult.

## 2022-12-17 DIAGNOSIS — N179 Acute kidney failure, unspecified: Secondary | ICD-10-CM | POA: Diagnosis not present

## 2022-12-17 LAB — CBC WITH DIFFERENTIAL/PLATELET
Abs Immature Granulocytes: 0.04 10*3/uL (ref 0.00–0.07)
Basophils Absolute: 0 10*3/uL (ref 0.0–0.1)
Basophils Relative: 0 %
Eosinophils Absolute: 0.3 10*3/uL (ref 0.0–0.5)
Eosinophils Relative: 3 %
HCT: 29.9 % — ABNORMAL LOW (ref 36.0–46.0)
Hemoglobin: 10.3 g/dL — ABNORMAL LOW (ref 12.0–15.0)
Immature Granulocytes: 0 %
Lymphocytes Relative: 25 %
Lymphs Abs: 2.4 10*3/uL (ref 0.7–4.0)
MCH: 30.7 pg (ref 26.0–34.0)
MCHC: 34.4 g/dL (ref 30.0–36.0)
MCV: 89 fL (ref 80.0–100.0)
Monocytes Absolute: 0.6 10*3/uL (ref 0.1–1.0)
Monocytes Relative: 7 %
Neutro Abs: 6.2 10*3/uL (ref 1.7–7.7)
Neutrophils Relative %: 65 %
Platelets: 214 10*3/uL (ref 150–400)
RBC: 3.36 MIL/uL — ABNORMAL LOW (ref 3.87–5.11)
RDW: 13.9 % (ref 11.5–15.5)
WBC: 9.6 10*3/uL (ref 4.0–10.5)
nRBC: 0 % (ref 0.0–0.2)

## 2022-12-17 LAB — BASIC METABOLIC PANEL
Anion gap: 8 (ref 5–15)
BUN: 34 mg/dL — ABNORMAL HIGH (ref 8–23)
CO2: 24 mmol/L (ref 22–32)
Calcium: 8.3 mg/dL — ABNORMAL LOW (ref 8.9–10.3)
Chloride: 103 mmol/L (ref 98–111)
Creatinine, Ser: 1.7 mg/dL — ABNORMAL HIGH (ref 0.44–1.00)
GFR, Estimated: 30 mL/min — ABNORMAL LOW (ref >60)
Glucose, Bld: 127 mg/dL — ABNORMAL HIGH (ref 70–99)
Potassium: 3.6 mmol/L (ref 3.5–5.1)
Sodium: 135 mmol/L (ref 135–145)

## 2022-12-17 LAB — MAGNESIUM: Magnesium: 1.8 mg/dL (ref 1.7–2.4)

## 2022-12-17 MED ORDER — MAGNESIUM SULFATE 2 GM/50ML IV SOLN
2.0000 g | Freq: Once | INTRAVENOUS | Status: AC
  Administered 2022-12-17: 2 g via INTRAVENOUS
  Filled 2022-12-17: qty 50

## 2022-12-17 MED ORDER — POTASSIUM CHLORIDE CRYS ER 20 MEQ PO TBCR
40.0000 meq | EXTENDED_RELEASE_TABLET | Freq: Once | ORAL | Status: AC
  Administered 2022-12-17: 40 meq via ORAL
  Filled 2022-12-17: qty 2

## 2022-12-17 NOTE — Discharge Summary (Signed)
Physician Discharge Summary  Elizabeth [name] [id number] DOB: [birth year] DOA: 12/15/2022  PCP: Charlane Ferretti, MD  Admit date: 12/15/2022 Discharge date: 12/17/2022  Admitted From: Home Disposition: Home  Recommendations for Outpatient Follow-up:  Follow up with PCP in 1-2 weeks Discontinued irbesartan/HCTZ for borderline hypotension Please obtain BMP in one week to reassess potassium level as well as renal function.  Home Health: PT Equipment/Devices: None  Discharge Condition: Stable CODE STATUS: Full code Diet recommendation: Heart healthy diet  History of present illness:  Elizabeth [name] is a 82 year old female with past medical history significant for HTN, COPD, osteoarthritis, nephrolithiasis, GERD, peripheral neuropathy who presented to [facility] ED on 2/12 with progressive generalized weakness, diarrhea.  Patient denies abdominal pain, no back pain but does endorse decreased urine output.  Patient recently was treated outpatient for severe sinusitis/congestion in which she completed 7-day course of doxycycline, 7-day course of Augmentin and steroids.  After stopping Augmentin, onset of diarrhea ensued with progressive weakness.  In the ED, BP borderline low with SBP in the 90s without tachycardia.  Creatinine elevated 2.3 with a baseline of 0.7, potassium 2.5, bicarb 23, WBC 10.7, hemoglobin 11.7, platelets 317.  CT Abdo/pelvis without contrast with nonobstructive bilateral kidney stones otherwise no significant acute findings.  Patient was started on IV fluids and given IV potassium replacement.  TRH consulted for admission for further evaluation management of progressive weakness in the setting of profuse/progressive diarrhea and hypokalemia.  Hospital course:    Acute renal failure Viral gastroenteritis Patient presented to the ED with progressive diarrhea in the setting of recent antibiotic use outpatient associated with progressive weakness.  Creatinine on admission 2.52 with a  baseline of 0.70.  Etiology likely secondary to prerenal azotemia in the setting of profuse diarrhea.  Patient was afebrile with a slightly elevated WBC count of 10.7.  Etiology likely secondary to viral gastroenteritis.  Patient was supported with IV fluid hydration with improvement of symptoms.  Creatinine improved to 1.70 at time of discharge.  Home irbesartan/HCTZ was discontinued.  Encouraged continued oral hydration after discharge.  Recommend follow-up with PCP 1-2 weeks with repeat BMP at that visit.  Hypokalemia: Resolved Potassium 2.5 on presentation.  Etiology likely secondary to GI loss with diffuse diarrhea as above.  Repleted during hospitalization.  Potassium 3.6 at time of discharge.  Recommend repeat BMP 1 week.  Essential hypertension. Home regimen includes amlodipine 5 mg p.o. daily, metoprolol succinate 25 mg p.o. daily, clonidine 0.2 mg p.o. nightly, and irbesartan-HCTZ 150-12.5 mg p.o. daily.  Patient's blood pressure was borderline low on admission and all of her antihypertensives were initially held.  May resume amlodipine, clonidine and metoprolol on discharge but will continue to hold irbesartan/HCTZ.  Recommend follow-up with PCP for further guidance.  COPD, stable Continue Incruse Ellipta 1 puff daily.  GERD: Protonix 20 mg p.o. daily  HLD: Crestor 40 mg p.o. daily  Generalized weakness/debility/deconditioning: Seen by PT and OT during hospitalizations with recommendation of home health PT.   Discharge Diagnoses:  Principal Problem:   AKI (acute kidney injury) ([place]) Active Problems:   COPD (chronic obstructive pulmonary disease) (H[facility])   Essential hypertension   Hypokalemia   Dehydration   Generalized weakness    Discharge Instructions  Discharge Instructions     Call MD for:  difficulty breathing, headache or visual disturbances   Complete by: As directed    Call MD for:  extreme fatigue   Complete by: As directed    Call MD for:  persistant  dizziness or light-headedness   Complete by: As directed    Call MD for:  persistant nausea and vomiting   Complete by: As directed    Call MD for:  severe uncontrolled pain   Complete by: As directed    Call MD for:  temperature >100.4   Complete by: As directed    Diet - low sodium heart healthy   Complete by: As directed    Increase activity slowly   Complete by: As directed       Allergies as of 12/17/2022       Reactions   Alendronate Sodium Other (See Comments)   loose teeth   Azithromycin Diarrhea, Nausea And Vomiting   Chantix [varenicline]    Other reaction(s): N/V/D and flu sxs   Other Other (See Comments)   Chlorexolone - unknown reaction   Pollen Extract    Allergic to tree pollens   Tegretol [carbamazepine] Swelling   Lisinopril Rash   Tetracyclines & Related Rash        Medication List     STOP taking these medications    amoxicillin-clavulanate 500-125 MG tablet Commonly known as: Augmentin   irbesartan-hydrochlorothiazide 150-12.5 MG tablet Commonly known as: AVALIDE   predniSONE 10 MG tablet Commonly known as: DELTASONE       TAKE these medications    amLODipine 5 MG tablet Commonly known as: NORVASC Take 5 mg by mouth daily.   aspirin EC 81 MG tablet Take 81 mg by mouth at bedtime. Swallow whole.   b complex vitamins capsule Take 1 capsule by mouth daily.   cloNIDine 0.1 MG tablet Commonly known as: CATAPRES Take 0.2 mg by mouth at bedtime.   Coenzyme Q10 100 MG capsule Take 100 mg by mouth every morning.   diphenoxylate-atropine 2.5-0.025 MG tablet Commonly known as: LOMOTIL Take 1 tablet by mouth 4 (four) times daily as needed.   feeding supplement Liqd Take 237 mLs by mouth 3 (three) times daily between meals.   fluticasone 50 MCG/ACT nasal spray Commonly known as: FLONASE Place 2 sprays into both nostrils daily.   gabapentin 100 MG capsule Commonly known as: NEURONTIN Take 2 capsules (200 mg total) by mouth at  bedtime. PATIENT NEEDS OFFICE VISIT FOR ADDITIONAL REFILLS What changed:  how much to take when to take this additional instructions   ICY HOT EX Apply 1 application topically daily as needed (pain).   Incruse Ellipta 62.5 MCG/ACT Aepb Generic drug: umeclidinium bromide Inhale 1 puff into the lungs daily.   metoprolol succinate 25 MG 24 hr tablet Commonly known as: TOPROL-XL Take 25 mg by mouth at bedtime.   multivitamin with minerals Tabs tablet Take 1 tablet by mouth 2 (two) times daily.   ondansetron 4 MG disintegrating tablet Commonly known as: ZOFRAN-ODT Take 4 mg by mouth every 8 (eight) hours as needed.   pantoprazole 20 MG tablet Commonly known as: PROTONIX TAKE 1 TABLET BY MOUTH EVERY DAY   rosuvastatin 40 MG tablet Commonly known as: CRESTOR Take 1 tablet (40 mg total) by mouth daily.   TURMERIC CURCUMIN PO Take 1 capsule by mouth daily. With Ginger   VITAMIN D PO Take 1 capsule by mouth daily.   VITAMIN E PO Take 1 capsule by mouth daily.        Follow-up Information     Charlane Ferretti, MD. Schedule an appointment as soon as possible for a visit in 1 week(s).   Specialty: Internal Medicine Contact information: [address] [place] [address] [phone]                Allergies  Allergen Reactions   Alendronate Sodium Other (See Comments)    loose teeth   Azithromycin Diarrhea and Nausea And Vomiting   Chantix [Varenicline]     Other reaction(s): N/V/D and flu sxs   Other Other (See Comments)    Chlorexolone - unknown reaction   Pollen Extract     Allergic to tree pollens   Tegretol [Carbamazepine] Swelling   Lisinopril Rash   Tetracyclines & Related Rash    Consultations: none   Procedures/Studies: DG Chest 1 View  Result Date: 12/15/2022 CLINICAL DATA:  Acute kidney injury EXAM: CHEST  1 VIEW COMPARISON:  12/04/2021 FINDINGS: Hyperinflation. Mild interstitial prominence. No consolidation, pneumothorax  or effusion. Emphysematous lung changes. Overlapping cardiac leads. Artifact from the patient's clothing. IMPRESSION: Hyperinflation with likely chronic interstitial changes. No consolidation or effusion Electronically Signed   By: Jill Side M.D.   On: 12/15/2022 14:26   CT ABDOMEN PELVIS WO CONTRAST  Result Date: 12/15/2022 CLINICAL DATA:  Left lower quadrant abdominal pain. EXAM: CT ABDOMEN AND PELVIS WITHOUT CONTRAST TECHNIQUE: Multidetector CT imaging of the abdomen and pelvis was performed following the standard protocol without IV contrast. RADIATION DOSE REDUCTION: This exam was performed according to the departmental dose-optimization program which includes automated exposure control, adjustment of the mA and/or kV according to patient size and/or use of iterative reconstruction technique. COMPARISON:  December 11, 2021. FINDINGS: Lower chest: No acute abnormality. Hepatobiliary: No focal liver abnormality is seen. No gallstones, gallbladder wall thickening, or biliary dilatation. Pancreas: Unremarkable. No pancreatic ductal dilatation or surrounding inflammatory changes. Spleen: Normal in size without focal abnormality. Adrenals/Urinary Tract: Adrenal glands appear normal. Small nonobstructive left renal calculus is noted. No hydronephrosis or renal obstruction is noted. Urinary bladder is unremarkable. Stomach/Bowel: The stomach appears normal. Status post appendectomy. There is no evidence of bowel obstruction or inflammation. Vascular/Lymphatic: Status post aortobifemoral bypass graft placement. No adenopathy is noted. Reproductive: Uterus and bilateral adnexa are unremarkable. Other: No abdominal wall hernia or abnormality. No abdominopelvic ascites. Musculoskeletal: No acute or significant osseous findings. IMPRESSION: Small nonobstructive left renal calculus. No acute abnormality seen in the abdomen or pelvis. Status post aortobifemoral bypass graft placement. Aortic Atherosclerosis  (ICD10-I70.0). Electronically Signed   By: Marijo Conception M.D.   On: 12/15/2022 12:59     Subjective: Patient seen examined bedside, resting comfortably.  Lying in bed.  No specific complaints this morning other than some mild weakness which has improved.  Ready for discharge home today.  Denies any further diarrhea.  Denies headache, no dizziness, no chest pain, no palpitations, no shortness of breath, no abdominal pain, no fever/chills/night sweats, no nausea/vomiting, no focal weakness, no fatigue, no paresthesias.  No acute events overnight per nursing staff.  Discharge Exam: Vitals:   12/17/22 0400 12/17/22 0810  BP: (!) 118/59 (!) 114/53  Pulse: 83 85  Resp: 15 16  Temp: 98 F (36.7 C) 97.8 F (36.6 C)  SpO2: 97% 95%   Vitals:   12/16/22 1620 12/16/22 1948 12/17/22 0400 12/17/22 0810  BP: (!) 94/51 (!) 95/52 (!) 118/59 (!) 114/53  Pulse: 96 96 83 85  Resp: 16 16 15 16  $ Temp: 99.4 F (37.4 C) (!) 100.8 F (38.2 C) 98 F (36.7 C) 97.8 F (36.6 C)  TempSrc: Oral Oral Oral Oral  SpO2: 96% 96% 97% 95%  Weight:  Height:        Physical Exam: GEN: NAD, alert and oriented x 3, elderly in appearance HEENT: NCAT, PERRL, EOMI, sclera clear, MMM PULM: CTAB w/o wheezes/crackles, normal respiratory effort, on room air CV: RRR w/o M/G/R GI: abd soft, NTND, NABS, no R/G/M MSK: no peripheral edema, moves all extremities independently NEURO: CN II-XII intact, no focal deficits, sensation to light touch intact PSYCH: normal mood/affect Integumentary: dry/intact, no rashes or wounds    The results of significant diagnostics from this hospitalization (including imaging, microbiology, ancillary and laboratory) are listed below for reference.     Microbiology: Recent Results (from the past 240 hour(s))  Resp panel by RT-PCR (RSV, Flu A&B, Covid) Anterior Nasal Swab     Status: None   Collection Time: 12/15/22 11:38 AM   Specimen: Anterior Nasal Swab  Result Value Ref Range  Status   SARS Coronavirus 2 by RT PCR NEGATIVE NEGATIVE Final   Influenza A by PCR NEGATIVE NEGATIVE Final   Influenza B by PCR NEGATIVE NEGATIVE Final    Comment: (NOTE) The Xpert Xpress SARS-CoV-2/FLU/RSV plus assay is intended as an aid in the diagnosis of influenza from Nasopharyngeal swab specimens and should not be used as a sole basis for treatment. Nasal washings and aspirates are unacceptable for Xpert Xpress SARS-CoV-2/FLU/RSV testing.  Fact Sheet for Patients: EntrepreneurPulse.com.au  Fact Sheet for Healthcare Providers: IncredibleEmployment.be  This test is not yet approved or cleared by the [place] FDA and has been authorized for detection and/or diagnosis of SARS-CoV-2 by FDA under an Emergency Use Authorization (EUA). This EUA will remain in effect (meaning this test can be used) for the duration of the COVID-19 declaration under Section 564(b)(1) of the Act, 21 U.S.C. section 360bbb-3(b)(1), unless the authorization is terminated or revoked.     Resp Syncytial Virus by PCR NEGATIVE NEGATIVE Final    Comment: (NOTE) Fact Sheet for Patients: EntrepreneurPulse.com.au  Fact Sheet for Healthcare Providers: IncredibleEmployment.be  This test is not yet approved or cleared by the [place] FDA and has been authorized for detection and/or diagnosis of SARS-CoV-2 by FDA under an Emergency Use Authorization (EUA). This EUA will remain in effect (meaning this test can be used) for the duration of the COVID-19 declaration under Section 564(b)(1) of the Act, 21 U.S.C. section 360bbb-3(b)(1), unless the authorization is terminated or revoked.  Performed at [facility], [place] [address] [place], [place] [address]      Labs: BNP (last 3 results) No results for input(s): "BNP" in the last 8760 hours. Basic Metabolic Panel: Recent Labs  Lab 12/15/22 1127 12/15/22 1614  12/16/22 0534 12/17/22 0036  NA 130* 131* 135 135  K 2.5* 2.9* 4.1 3.6  CL 90* 92* 103 103  CO2 23 27 22 24  $ GLUCOSE 178* 134* 132* 127*  BUN 54* 51* 45* 34*  CREATININE 2.52* 2.33* 2.06* 1.70*  CALCIUM 9.1 8.7* 8.4* 8.3*  MG  --  2.3  --  1.8  PHOS  --  3.2  --   --    Liver Function Tests: Recent Labs  Lab 12/15/22 1127  AST 47*  ALT 25  ALKPHOS 64  BILITOT 0.6  PROT 6.7  ALBUMIN 2.9*   Recent Labs  Lab 12/15/22 1127  LIPASE 61*   No results for input(s): "AMMONIA" in the last 168 hours. CBC: Recent Labs  Lab 12/15/22 1127 12/16/22 0401 12/17/22 0036  WBC 10.7* 8.3 9.6  NEUTROABS  --   --  6.2  HGB 11.7* 8.6* 10.3*  HCT 34.6* 24.3* 29.9*  MCV 89.9 87.4 89.0  PLT 317 192 214   Cardiac Enzymes: Recent Labs  Lab 12/15/22 1127  CKTOTAL 314*   BNP: Invalid input(s): "POCBNP" CBG: Recent Labs  Lab 12/15/22 1136  GLUCAP 151*   D-Dimer No results for input(s): "DDIMER" in the last 72 hours. Hgb A1c No results for input(s): "HGBA1C" in the last 72 hours. Lipid Profile No results for input(s): "CHOL", "HDL", "LDLCALC", "TRIG", "CHOLHDL", "LDLDIRECT" in the last 72 hours. Thyroid function studies No results for input(s): "TSH", "T4TOTAL", "T3FREE", "THYROIDAB" in the last 72 hours.  Invalid input(s): "FREET3" Anemia work up No results for input(s): "VITAMINB12", "FOLATE", "FERRITIN", "TIBC", "IRON", "RETICCTPCT" in the last 72 hours. Urinalysis    Component Value Date/Time   COLORURINE YELLOW 12/15/2022 1109   APPEARANCEUR HAZY (A) 12/15/2022 1109   LABSPEC 1.012 12/15/2022 1109   PHURINE 6.0 12/15/2022 1109   GLUCOSEU NEGATIVE 12/15/2022 1109   HGBUR NEGATIVE 12/15/2022 1109   [place] 12/15/2022 1109   KETONESUR NEGATIVE 12/15/2022 1109   PROTEINUR 30 (A) 12/15/2022 1109   NITRITE NEGATIVE 12/15/2022 1109   LEUKOCYTESUR NEGATIVE 12/15/2022 1109   Sepsis Labs Recent Labs  Lab 12/15/22 1127 12/16/22 0401 12/17/22 0036  WBC  10.7* 8.3 9.6   Microbiology Recent Results (from the past 240 hour(s))  Resp panel by RT-PCR (RSV, Flu A&B, Covid) Anterior Nasal Swab     Status: None   Collection Time: 12/15/22 11:38 AM   Specimen: Anterior Nasal Swab  Result Value Ref Range Status   SARS Coronavirus 2 by RT PCR NEGATIVE NEGATIVE Final   Influenza A by PCR NEGATIVE NEGATIVE Final   Influenza B by PCR NEGATIVE NEGATIVE Final    Comment: (NOTE) The Xpert Xpress SARS-CoV-2/FLU/RSV plus assay is intended as an aid in the diagnosis of influenza from Nasopharyngeal swab specimens and should not be used as a sole basis for treatment. Nasal washings and aspirates are unacceptable for Xpert Xpress SARS-CoV-2/FLU/RSV testing.  Fact Sheet for Patients: EntrepreneurPulse.com.au  Fact Sheet for Healthcare Providers: IncredibleEmployment.be  This test is not yet approved or cleared by the [place] FDA and has been authorized for detection and/or diagnosis of SARS-CoV-2 by FDA under an Emergency Use Authorization (EUA). This EUA will remain in effect (meaning this test can be used) for the duration of the COVID-19 declaration under Section 564(b)(1) of the Act, 21 U.S.C. section 360bbb-3(b)(1), unless the authorization is terminated or revoked.     Resp Syncytial Virus by PCR NEGATIVE NEGATIVE Final    Comment: (NOTE) Fact Sheet for Patients: EntrepreneurPulse.com.au  Fact Sheet for Healthcare Providers: IncredibleEmployment.be  This test is not yet approved or cleared by the [place] FDA and has been authorized for detection and/or diagnosis of SARS-CoV-2 by FDA under an Emergency Use Authorization (EUA). This EUA will remain in effect (meaning this test can be used) for the duration of the COVID-19 declaration under Section 564(b)(1) of the Act, 21 U.S.C. section 360bbb-3(b)(1), unless the authorization is terminated  or revoked.  Performed at [facility], [place] [address], [place], [place] [address]      Time coordinating discharge: Over 30 minutes  SIGNED:   Donnamarie Poag [place] DO  [facility] 12/17/2022, 9:56 AM

## 2022-12-17 NOTE — TOC Transition Note (Signed)
Transition of Care [facility]) - CM/SW Discharge Note   Patient Details  Name: Elizabeth [name] MRN: [id number] Date of Birth: 22-Jan-1941  Transition of [facility] CM/SW Contact:  Carles Collet, RN Phone Number: 12/17/2022, 10:01 AM   Clinical Narrative:      [name] w patient at bedside. She identifies herself as active, still working prior to admission. We discussed HH and she is familiar with [facility] services as she used them this time last year. She does not feel it would be of much benefit for her after DC. She feels that she will gain strength mobilizing at home and will be safe with out additional PT services. She has a RW if needed, but does not feel like she will.  No needs identified for DC.   Final next level of care: Home/Self Care Barriers to Discharge: No Barriers Identified   Patient Goals and CMS Choice      Discharge Placement                         Discharge Plan and Services Additional resources added to the After Visit Summary for                                       Social Determinants of Health (SDOH) Interventions SDOH Screenings   Food Insecurity: No Food Insecurity (12/15/2022)  Housing: Low Risk  (12/15/2022)  Transportation Needs: No Transportation Needs (12/15/2022)  Utilities: Not At Risk (12/15/2022)  Tobacco Use: High Risk (12/16/2022)     Readmission Risk Interventions    12/10/2021    9:47 AM  Readmission Risk Prevention Plan  Post Dischage Appt Complete  Medication Screening Complete  Transportation Screening Complete

## 2022-12-17 NOTE — Progress Notes (Signed)
   12/17/22 1000  Mobility  Activity Ambulated with assistance in room;Transferred from bed to chair  Level of Assistance Standby assist, set-up cues, supervision of patient - no hands on  Assistive Device None  Distance Ambulated (ft) 5 ft  Activity Response Tolerated well  Mobility Referral Yes  $Mobility charge 1 Mobility   Mobility Specialist Progress Note  Pt in bed and agreeable. Had no c/o pain. Returned to chair w/ all needs met and call bell in reach.  Lucious Groves Mobility Specialist  Please contact via SecureChat or Rehab office at [phone]

## 2022-12-17 NOTE — Progress Notes (Signed)
Discharge instructions reviewed with pt and her son, both verbalized understanding.  Copy of instructions given to pt. No new scripts.  Pt d/c'd via wheelchair with belongings, with her son.       Escorted by hospital volunteer.

## 2022-12-19 DIAGNOSIS — D649 Anemia, unspecified: Secondary | ICD-10-CM | POA: Diagnosis not present

## 2022-12-19 DIAGNOSIS — R5383 Other fatigue: Secondary | ICD-10-CM | POA: Diagnosis not present

## 2022-12-19 DIAGNOSIS — Z79899 Other long term (current) drug therapy: Secondary | ICD-10-CM | POA: Diagnosis not present

## 2022-12-19 DIAGNOSIS — E860 Dehydration: Secondary | ICD-10-CM | POA: Diagnosis not present

## 2022-12-19 DIAGNOSIS — E782 Mixed hyperlipidemia: Secondary | ICD-10-CM | POA: Diagnosis not present

## 2022-12-19 DIAGNOSIS — I10 Essential (primary) hypertension: Secondary | ICD-10-CM | POA: Diagnosis not present

## 2022-12-24 DIAGNOSIS — C44321 Squamous cell carcinoma of skin of nose: Secondary | ICD-10-CM | POA: Diagnosis not present

## 2022-12-24 DIAGNOSIS — D485 Neoplasm of uncertain behavior of skin: Secondary | ICD-10-CM | POA: Diagnosis not present

## 2023-01-08 NOTE — Progress Notes (Signed)
POST OPERATIVE OFFICE NOTE    CC:  F/u for surgery  HPI:  This is a 82 y.o. female who is s/p aortobifemoral bypass, aortic endarterectomy; lysis of adhesions, ligation of inferior mesenteric vein and bilateral common femoral endarterectomy by Dr. Donzetta Matters and Dr. Virl Cagey on 12/03/21. She did well post operatively and was discharged POD#7.   Postoperatively, she had acute pancreatitis which she has recovered from.  At her last visit, she had normal ABIs bilaterally.  On exam today, Elizabeth [name] noted that she has slowed significantly since her last visit.  She is not a fan of "getting old".  She denies symptoms of claudication, ischemic rest pain, tissue loss.  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  She was hospitalized last month for dehydration and noted systolic blood pressures in the 70s.   Allergies  Allergen Reactions   Alendronate Sodium Other (See Comments)    loose teeth   Azithromycin Diarrhea and Nausea And Vomiting   Chantix [Varenicline]     Other reaction(s): N/V/D and flu sxs   Other Other (See Comments)    Chlorexolone - unknown reaction   Pollen Extract     Allergic to tree pollens   Tegretol [Carbamazepine] Swelling   Lisinopril Rash   Tetracyclines & Related Rash    Current Outpatient Medications  Medication Sig Dispense Refill   amLODipine (NORVASC) 5 MG tablet Take 5 mg by mouth daily.     aspirin EC 81 MG tablet Take 81 mg by mouth at bedtime. Swallow whole.     b complex vitamins capsule Take 1 capsule by mouth daily.     cloNIDine (CATAPRES) 0.1 MG tablet Take 0.2 mg by mouth at bedtime.     Coenzyme Q10 100 MG capsule Take 100 mg by mouth every morning.     diphenoxylate-atropine (LOMOTIL) 2.5-0.025 MG tablet Take 1 tablet by mouth 4 (four) times daily as needed.     feeding supplement (ENSURE ENLIVE / ENSURE PLUS) LIQD Take 237 mLs by mouth 3 (three) times daily between meals. 237 mL 12   fluticasone (FLONASE) 50 MCG/ACT  nasal spray Place 2 sprays into both nostrils daily. 9.9 mL 0   gabapentin (NEURONTIN) 100 MG capsule Take 2 capsules (200 mg total) by mouth at bedtime. PATIENT NEEDS OFFICE VISIT FOR ADDITIONAL REFILLS (Patient taking differently: Take 100-200 mg by mouth See admin instructions. Take one capsule (100 mg) by mouth every morning and two capsules (200 mg) at night) 60 capsule 0   INCRUSE ELLIPTA 62.5 MCG/INH AEPB Inhale 1 puff into the lungs daily. 30 each 1   Menthol, Topical Analgesic, (ICY HOT EX) Apply 1 application topically daily as needed (pain).     metoprolol succinate (TOPROL-XL) 25 MG 24 hr tablet Take 25 mg by mouth at bedtime.     Multiple Vitamin (MULTIVITAMIN WITH MINERALS) TABS tablet Take 1 tablet by mouth 2 (two) times daily.     ondansetron (ZOFRAN-ODT) 4 MG disintegrating tablet Take 4 mg by mouth every 8 (eight) hours as needed.     pantoprazole (PROTONIX) 20 MG tablet TAKE 1 TABLET BY MOUTH EVERY DAY 90 tablet 2   rosuvastatin (CRESTOR) 40 MG tablet Take 1 tablet (40 mg total) by mouth daily. 90 tablet 3   TURMERIC CURCUMIN PO Take 1 capsule by mouth daily. With Ginger     VITAMIN D PO Take 1 capsule by mouth daily.     VITAMIN E PO Take 1 capsule by mouth  daily.     No current facility-administered medications for this visit.     ROS:  See HPI  Physical Exam:  There were no vitals filed for this visit.  General: well appearing, in no acute distress Lungs: non labored Cardiac: regular rate and rhythm Incision:  Laparotomy incision, B groin incisions healing very well Extremities:  well perfused and warm with 2+ femoral pulses, 2+ DP pulses right, 1+ left. Feet warm and well perfused Neuro: alert and oriented Abdomen:  flat, soft, non distended  Non invasive vascular lab: Significant decrease in the left lower extremity ABI   Assessment/Plan:  This is a 82 y.o. female who is s/p:aortobifemoral bypass, aortic endarterectomy; lysis of adhesions, ligation of  inferior mesenteric vein and bilateral common femoral endarterectomy by Dr. Virl Cagey and Dr. Donzetta Matters on 12/03/21.   Overall, Elizabeth [name] seems to be doing well.  She has lost some of her pep recently after her hospitalization last month. Regarding her ABIs, I am concerned with the 30 point drop in the left leg.  On physical exam, she had a normal femoral pulse, so I am not concerned that the bypass limb is down, however there is some progression in atherosclerotic disease or neointimal hyperplasia.  I have ordered a CT angio abdomen pelvis in an effort to further define any issues that could be threatening the left bypass limb.  I plan to call her in the coming weeks once the CT has been completed.  - I have encouraged her to walk/ ambulate as much as tolerable - She will continue Aspirin and statin    [name] [phone]

## 2023-01-09 ENCOUNTER — Encounter: Payer: Self-pay | Admitting: Vascular Surgery

## 2023-01-09 ENCOUNTER — Ambulatory Visit (INDEPENDENT_AMBULATORY_CARE_PROVIDER_SITE_OTHER): Payer: Medicare Other | Admitting: Vascular Surgery

## 2023-01-09 ENCOUNTER — Other Ambulatory Visit: Payer: Self-pay

## 2023-01-09 ENCOUNTER — Ambulatory Visit (HOSPITAL_COMMUNITY)
Admission: RE | Admit: 2023-01-09 | Discharge: 2023-01-09 | Disposition: A | Discharge status: Home / Self Care | Payer: Medicare Other | Source: Ambulatory Visit | Attending: Vascular Surgery | Admitting: Vascular Surgery

## 2023-01-09 ENCOUNTER — Encounter: Payer: Self-pay | Admitting: Pulmonary Disease

## 2023-01-09 VITALS — BP 138/76 | HR 73 | Temp 97.8°F | Resp 20 | Ht 60.0 in | Wt 105.0 lb

## 2023-01-09 DIAGNOSIS — I70222 Atherosclerosis of native arteries of extremities with rest pain, left leg: Secondary | ICD-10-CM

## 2023-01-09 DIAGNOSIS — I739 Peripheral vascular disease, unspecified: Secondary | ICD-10-CM | POA: Diagnosis not present

## 2023-01-09 LAB — VAS US ABI WITH/WO TBI
Left ABI: 0.7
Right ABI: 0.99

## 2023-01-12 DIAGNOSIS — I10 Essential (primary) hypertension: Secondary | ICD-10-CM | POA: Diagnosis not present

## 2023-01-12 DIAGNOSIS — I350 Nonrheumatic aortic (valve) stenosis: Secondary | ICD-10-CM | POA: Diagnosis not present

## 2023-01-12 DIAGNOSIS — D649 Anemia, unspecified: Secondary | ICD-10-CM | POA: Diagnosis not present

## 2023-01-12 DIAGNOSIS — R5383 Other fatigue: Secondary | ICD-10-CM | POA: Diagnosis not present

## 2023-01-12 DIAGNOSIS — N179 Acute kidney failure, unspecified: Secondary | ICD-10-CM | POA: Diagnosis not present

## 2023-01-12 DIAGNOSIS — E782 Mixed hyperlipidemia: Secondary | ICD-10-CM | POA: Diagnosis not present

## 2023-01-13 DIAGNOSIS — C44321 Squamous cell carcinoma of skin of nose: Secondary | ICD-10-CM | POA: Diagnosis not present

## 2023-01-13 DIAGNOSIS — Z85828 Personal history of other malignant neoplasm of skin: Secondary | ICD-10-CM | POA: Diagnosis not present

## 2023-01-15 ENCOUNTER — Ambulatory Visit (HOSPITAL_COMMUNITY)
Admission: RE | Admit: 2023-01-15 | Discharge: 2023-01-15 | Disposition: A | Discharge status: Home / Self Care | Payer: Medicare Other | Source: Ambulatory Visit | Attending: Vascular Surgery | Admitting: Vascular Surgery

## 2023-01-15 DIAGNOSIS — N179 Acute kidney failure, unspecified: Secondary | ICD-10-CM | POA: Diagnosis not present

## 2023-01-15 DIAGNOSIS — I701 Atherosclerosis of renal artery: Secondary | ICD-10-CM | POA: Diagnosis not present

## 2023-01-15 DIAGNOSIS — I70222 Atherosclerosis of native arteries of extremities with rest pain, left leg: Secondary | ICD-10-CM | POA: Diagnosis not present

## 2023-01-15 LAB — POCT I-STAT CREATININE: Creatinine, Ser: 1 mg/dL (ref 0.44–1.00)

## 2023-01-15 MED ORDER — IOHEXOL 350 MG/ML SOLN
100.0000 mL | Freq: Once | INTRAVENOUS | Status: AC | PRN
  Administered 2023-01-15: 100 mL via INTRAVENOUS

## 2023-01-23 ENCOUNTER — Ambulatory Visit (INDEPENDENT_AMBULATORY_CARE_PROVIDER_SITE_OTHER): Payer: Medicare Other | Admitting: Vascular Surgery

## 2023-01-23 DIAGNOSIS — Z95828 Presence of other vascular implants and grafts: Secondary | ICD-10-CM

## 2023-01-23 NOTE — Progress Notes (Signed)
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  HPI:  This is a 82 y.o. female who is s/p aortobifemoral bypass, aortic endarterectomy; lysis of adhesions, ligation of inferior mesenteric vein and bilateral common femoral endarterectomy by Dr. Donzetta Matters and Dr. Virl Cagey on 12/03/21. She did well post operatively and was discharged POD#7.   Postoperatively, she had acute pancreatitis which she has recovered from.    At her last office visit, there was a decrease in ABIs.  I asked that she complete a CT scan in an effort to ensure the previous aortobifemoral bypass was widely patent.  During our phone call today, Elizabeth [name] was doing well.  She had no complaints, other than getting old.  She denied symptoms of claudication, ischemic rest pain, tissue loss.  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   Allergies  Allergen Reactions   Alendronate Sodium Other (See Comments)    loose teeth   Azithromycin Diarrhea and Nausea And Vomiting   Chantix [Varenicline]     Other reaction(s): N/V/D and flu sxs   Other Other (See Comments)    Chlorexolone - unknown reaction   Pollen Extract     Allergic to tree pollens   Tegretol [Carbamazepine] Swelling   Lisinopril Rash   Tetracyclines & Related Rash    Current Outpatient Medications  Medication Sig Dispense Refill   amLODipine (NORVASC) 5 MG tablet Take 5 mg by mouth daily.     aspirin EC 81 MG tablet Take 81 mg by mouth at bedtime. Swallow whole.     b complex vitamins capsule Take 1 capsule by mouth daily.     cloNIDine (CATAPRES) 0.1 MG tablet Take 0.2 mg by mouth at bedtime.     Coenzyme Q10 100 MG capsule Take 100 mg by mouth every morning.     diphenoxylate-atropine (LOMOTIL) 2.5-0.025 MG tablet Take 1 tablet by mouth 4 (four) times daily as needed.     feeding supplement (ENSURE ENLIVE / ENSURE PLUS) LIQD Take 237 mLs by mouth 3 (three) times daily between meals. 237 mL 12   fluticasone  (FLONASE) 50 MCG/ACT nasal spray Place 2 sprays into both nostrils daily. 9.9 mL 0   gabapentin (NEURONTIN) 100 MG capsule Take 2 capsules (200 mg total) by mouth at bedtime. PATIENT NEEDS OFFICE VISIT FOR ADDITIONAL REFILLS (Patient taking differently: Take 100-200 mg by mouth See admin instructions. Take one capsule (100 mg) by mouth every morning and two capsules (200 mg) at night) 60 capsule 0   INCRUSE ELLIPTA 62.5 MCG/INH AEPB Inhale 1 puff into the lungs daily. 30 each 1   Menthol, Topical Analgesic, (ICY HOT EX) Apply 1 application topically daily as needed (pain).     metoprolol succinate (TOPROL-XL) 25 MG 24 hr tablet Take 25 mg by mouth at bedtime.     Multiple Vitamin (MULTIVITAMIN WITH MINERALS) TABS tablet Take 1 tablet by mouth 2 (two) times daily.     ondansetron (ZOFRAN-ODT) 4 MG disintegrating tablet Take 4 mg by mouth every 8 (eight) hours as needed.     pantoprazole (PROTONIX) 20 MG tablet TAKE 1 TABLET BY MOUTH EVERY DAY 90 tablet 2   rosuvastatin (CRESTOR) 40 MG tablet Take 1 tablet (40 mg total) by mouth daily. 90 tablet 3   TURMERIC CURCUMIN PO Take 1 capsule by mouth daily. With Ginger     VITAMIN D PO Take 1 capsule by mouth daily.     VITAMIN E PO Take 1  capsule by mouth daily.     No current facility-administered medications for this visit.     ROS:  See HPI  Physical Exam:  There were no vitals filed for this visit.  Deferred as this was a phone call visit  Non invasive vascular lab: Significant decrease in the left lower extremity ABI Widely patent aortobifemoral bypass.  Left-sided superficial femoral artery occluded   Assessment/Plan:  This is a 82 y.o. female who is s/p:aortobifemoral bypass, aortic endarterectomy; lysis of adhesions, ligation of inferior mesenteric vein and bilateral common femoral endarterectomy by Dr. Virl Cagey and Dr. Donzetta Matters on 12/03/21.  Elizabeth [name] and I reviewed her most recent CT scan.  This demonstrated widely patent aortobifemoral bypass  with large profundus bilaterally.  The right superficial femoral artery is patent, the left sided SFA is occluded, contributing to her decrease in ABI.  Being that she is asymptomatic, we will continue to follow this every 6 months.  I have ordered an arterial duplex of the ABF, as well as the common femoral arteries to be performed in the coming weeks.  This will provide a baseline for her next visit in 6 months.  - I have encouraged her to walk/ ambulate as much as tolerable - She will continue Aspirin and statin    [name] [phone]

## 2023-01-28 ENCOUNTER — Other Ambulatory Visit: Payer: Self-pay

## 2023-01-28 DIAGNOSIS — I739 Peripheral vascular disease, unspecified: Secondary | ICD-10-CM

## 2023-02-03 DIAGNOSIS — M4316 Spondylolisthesis, lumbar region: Secondary | ICD-10-CM | POA: Diagnosis not present

## 2023-03-24 DIAGNOSIS — L298 Other pruritus: Secondary | ICD-10-CM | POA: Diagnosis not present

## 2023-03-24 DIAGNOSIS — I739 Peripheral vascular disease, unspecified: Secondary | ICD-10-CM | POA: Diagnosis not present

## 2023-03-24 DIAGNOSIS — Z79899 Other long term (current) drug therapy: Secondary | ICD-10-CM | POA: Diagnosis not present

## 2023-03-24 DIAGNOSIS — M792 Neuralgia and neuritis, unspecified: Secondary | ICD-10-CM | POA: Diagnosis not present

## 2023-03-24 DIAGNOSIS — I10 Essential (primary) hypertension: Secondary | ICD-10-CM | POA: Diagnosis not present

## 2023-03-24 DIAGNOSIS — Z85828 Personal history of other malignant neoplasm of skin: Secondary | ICD-10-CM | POA: Diagnosis not present

## 2023-03-24 DIAGNOSIS — L821 Other seborrheic keratosis: Secondary | ICD-10-CM | POA: Diagnosis not present

## 2023-03-24 DIAGNOSIS — M5136 Other intervertebral disc degeneration, lumbar region: Secondary | ICD-10-CM | POA: Diagnosis not present

## 2023-04-09 DIAGNOSIS — E876 Hypokalemia: Secondary | ICD-10-CM | POA: Diagnosis not present

## 2023-04-27 DIAGNOSIS — H43393 Other vitreous opacities, bilateral: Secondary | ICD-10-CM | POA: Diagnosis not present

## 2023-04-27 DIAGNOSIS — H40033 Anatomical narrow angle, bilateral: Secondary | ICD-10-CM | POA: Diagnosis not present

## 2023-06-17 DIAGNOSIS — L245 Irritant contact dermatitis due to other chemical products: Secondary | ICD-10-CM | POA: Diagnosis not present

## 2023-06-17 DIAGNOSIS — Z85828 Personal history of other malignant neoplasm of skin: Secondary | ICD-10-CM | POA: Diagnosis not present

## 2023-06-17 DIAGNOSIS — L720 Epidermal cyst: Secondary | ICD-10-CM | POA: Diagnosis not present

## 2023-06-17 DIAGNOSIS — L700 Acne vulgaris: Secondary | ICD-10-CM | POA: Diagnosis not present

## 2023-06-23 DIAGNOSIS — M792 Neuralgia and neuritis, unspecified: Secondary | ICD-10-CM | POA: Diagnosis not present

## 2023-06-23 DIAGNOSIS — I739 Peripheral vascular disease, unspecified: Secondary | ICD-10-CM | POA: Diagnosis not present

## 2023-06-23 DIAGNOSIS — M5136 Other intervertebral disc degeneration, lumbar region: Secondary | ICD-10-CM | POA: Diagnosis not present

## 2023-06-23 DIAGNOSIS — E782 Mixed hyperlipidemia: Secondary | ICD-10-CM | POA: Diagnosis not present

## 2023-06-23 DIAGNOSIS — Z79899 Other long term (current) drug therapy: Secondary | ICD-10-CM | POA: Diagnosis not present

## 2023-06-23 DIAGNOSIS — I10 Essential (primary) hypertension: Secondary | ICD-10-CM | POA: Diagnosis not present

## 2023-07-01 DIAGNOSIS — L245 Irritant contact dermatitis due to other chemical products: Secondary | ICD-10-CM | POA: Diagnosis not present

## 2023-07-01 DIAGNOSIS — Z419 Encounter for procedure for purposes other than remedying health state, unspecified: Secondary | ICD-10-CM | POA: Diagnosis not present

## 2023-07-01 DIAGNOSIS — Z85828 Personal history of other malignant neoplasm of skin: Secondary | ICD-10-CM | POA: Diagnosis not present

## 2023-07-01 DIAGNOSIS — L0109 Other impetigo: Secondary | ICD-10-CM | POA: Diagnosis not present

## 2023-07-20 DIAGNOSIS — Z23 Encounter for immunization: Secondary | ICD-10-CM | POA: Diagnosis not present

## 2023-07-21 DIAGNOSIS — L859 Epidermal thickening, unspecified: Secondary | ICD-10-CM | POA: Diagnosis not present

## 2023-07-21 DIAGNOSIS — L308 Other specified dermatitis: Secondary | ICD-10-CM | POA: Diagnosis not present

## 2023-07-21 DIAGNOSIS — D485 Neoplasm of uncertain behavior of skin: Secondary | ICD-10-CM | POA: Diagnosis not present

## 2023-07-21 DIAGNOSIS — Z85828 Personal history of other malignant neoplasm of skin: Secondary | ICD-10-CM | POA: Diagnosis not present

## 2023-07-27 ENCOUNTER — Encounter (INDEPENDENT_AMBULATORY_CARE_PROVIDER_SITE_OTHER): Payer: Medicare Other | Admitting: Ophthalmology

## 2023-07-27 DIAGNOSIS — D3132 Benign neoplasm of left choroid: Secondary | ICD-10-CM | POA: Diagnosis not present

## 2023-07-27 DIAGNOSIS — H35033 Hypertensive retinopathy, bilateral: Secondary | ICD-10-CM

## 2023-07-27 DIAGNOSIS — I10 Essential (primary) hypertension: Secondary | ICD-10-CM | POA: Diagnosis not present

## 2023-07-27 DIAGNOSIS — D3131 Benign neoplasm of right choroid: Secondary | ICD-10-CM

## 2023-07-27 DIAGNOSIS — H43813 Vitreous degeneration, bilateral: Secondary | ICD-10-CM

## 2023-07-31 ENCOUNTER — Encounter: Payer: Self-pay | Admitting: Vascular Surgery

## 2023-07-31 ENCOUNTER — Ambulatory Visit (INDEPENDENT_AMBULATORY_CARE_PROVIDER_SITE_OTHER): Payer: Medicare Other | Admitting: Vascular Surgery

## 2023-07-31 ENCOUNTER — Ambulatory Visit (HOSPITAL_COMMUNITY)
Admission: RE | Admit: 2023-07-31 | Discharge: 2023-07-31 | Disposition: A | Discharge status: Home / Self Care | Payer: Medicare Other | Source: Ambulatory Visit | Attending: Vascular Surgery | Admitting: Vascular Surgery

## 2023-07-31 VITALS — BP 131/75 | HR 63 | Temp 97.7°F | Resp 20 | Ht 60.0 in | Wt 107.0 lb

## 2023-07-31 DIAGNOSIS — Z95828 Presence of other vascular implants and grafts: Secondary | ICD-10-CM | POA: Diagnosis not present

## 2023-07-31 DIAGNOSIS — I739 Peripheral vascular disease, unspecified: Secondary | ICD-10-CM | POA: Insufficient documentation

## 2023-07-31 LAB — VAS US ABI WITH/WO TBI
Left ABI: 0.77
Right ABI: 1.07

## 2023-07-31 NOTE — Progress Notes (Signed)
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  HPI:  This is a 82 y.o. female who is s/p aortobifemoral bypass, aortic endarterectomy; lysis of adhesions, ligation of inferior mesenteric vein and bilateral common femoral endarterectomy by Dr. Randie Heinz and Dr. Karin Lieu on 12/03/21. She did well post operatively and was discharged POD#7.   Postoperatively, she had acute pancreatitis which she has recovered from.  Earlier this year, ABI's were depressed in the left lower extremity necessitating CT angio abdomen pelvis.  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  The profunda was widely patent, and she was asymptomatic, therefore we elected to continue medical management.  On exam today, Elizabeth [name] was doing well.  She had no complaints.  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  She denies symptoms of claudication, ischemic rest pain or tissue loss.  Does note neuropathy in bilateral feet.  Also has some cramping overnight which is intermittent.  Elizabeth [name] continues to ambulate is much as possible, but states she cannot go far without significant weakness.  Denies symptoms of claudication.  She continues to smoke    Allergies  Allergen Reactions   Alendronate Sodium Other (See Comments)    loose teeth   Azithromycin Diarrhea and Nausea And Vomiting   Chantix [Varenicline]     Other reaction(s): N/V/D and flu sxs   Other Other (See Comments)    Chlorexolone - unknown reaction   Pollen Extract     Allergic to tree pollens   Tegretol [Carbamazepine] Swelling   Lisinopril Rash   Tetracyclines & Related Rash    Current Outpatient Medications  Medication Sig Dispense Refill   amLODipine (NORVASC) 5 MG tablet Take 5 mg by mouth daily.     aspirin EC 81 MG tablet Take  81 mg by mouth at bedtime. Swallow whole.     b complex vitamins capsule Take 1 capsule by mouth daily.     cloNIDine (CATAPRES) 0.1 MG tablet Take 0.2 mg by mouth at bedtime.     Coenzyme Q10 100 MG capsule Take 100 mg by mouth every morning.     diphenoxylate-atropine (LOMOTIL) 2.5-0.025 MG tablet Take 1 tablet by mouth 4 (four) times daily as needed.     feeding supplement (ENSURE ENLIVE / ENSURE PLUS) LIQD Take 237 mLs by mouth 3 (three) times daily between meals. 237 mL 12   fluticasone (FLONASE) 50 MCG/ACT nasal spray Place 2 sprays into both nostrils daily. 9.9 mL 0   gabapentin (NEURONTIN) 100 MG capsule Take 2 capsules (200 mg total) by mouth at bedtime. PATIENT NEEDS OFFICE VISIT FOR ADDITIONAL REFILLS (Patient taking differently: Take 100-200 mg by mouth See admin instructions. Take one capsule (100 mg) by mouth every morning and two capsules (200 mg) at night) 60 capsule 0   INCRUSE ELLIPTA 62.5 MCG/INH AEPB Inhale 1 puff into the lungs daily. 30 each 1   Menthol, Topical Analgesic, (ICY HOT EX) Apply 1 application topically daily as needed (pain).     metoprolol succinate (TOPROL-XL) 25 MG 24 hr tablet Take 25 mg by mouth at bedtime.     Multiple Vitamin (MULTIVITAMIN WITH MINERALS) TABS tablet Take 1 tablet by mouth 2 (two) times daily.     ondansetron (ZOFRAN-ODT) 4 MG disintegrating tablet Take 4 mg by mouth every 8 (  eight) hours as needed.     pantoprazole (PROTONIX) 20 MG tablet TAKE 1 TABLET BY MOUTH EVERY DAY 90 tablet 2   rosuvastatin (CRESTOR) 40 MG tablet Take 1 tablet (40 mg total) by mouth daily. 90 tablet 3   TURMERIC CURCUMIN PO Take 1 capsule by mouth daily. With Ginger     VITAMIN D PO Take 1 capsule by mouth daily.     VITAMIN E PO Take 1 capsule by mouth daily.     No current facility-administered medications for this visit.     ROS:  See HPI  Physical Exam:  There were no vitals filed for this visit.  Deferred as this was a phone call visit  Non invasive  vascular lab:    Assessment/Plan:  This is a 82 y.o. female who is s/p:aortobifemoral bypass, aortic endarterectomy; lysis of adhesions, ligation of inferior mesenteric vein and bilateral common femoral endarterectomy by Dr. Karin Lieu and Dr. Randie Heinz on 12/03/21.  ABIs unchanged over the last 6 months.  She remains asymptomatic.  My plan is to see her on a yearly basis with repeat ABI.    We discussed smoking cessation, however Elizabeth [name] is not interested.  I asked that she continue her aspirin and statin therapy.  And call should any questions or concerns arise.   [facility] [phone]

## 2023-08-07 DIAGNOSIS — M8588 Other specified disorders of bone density and structure, other site: Secondary | ICD-10-CM | POA: Diagnosis not present

## 2023-08-07 DIAGNOSIS — Z1231 Encounter for screening mammogram for malignant neoplasm of breast: Secondary | ICD-10-CM | POA: Diagnosis not present

## 2023-08-11 ENCOUNTER — Other Ambulatory Visit: Payer: Self-pay

## 2023-08-11 DIAGNOSIS — I739 Peripheral vascular disease, unspecified: Secondary | ICD-10-CM

## 2023-08-24 DIAGNOSIS — Z85828 Personal history of other malignant neoplasm of skin: Secondary | ICD-10-CM | POA: Diagnosis not present

## 2023-08-24 DIAGNOSIS — L253 Unspecified contact dermatitis due to other chemical products: Secondary | ICD-10-CM | POA: Diagnosis not present

## 2023-09-30 DIAGNOSIS — Q453 Other congenital malformations of pancreas and pancreatic duct: Secondary | ICD-10-CM | POA: Diagnosis not present

## 2023-09-30 DIAGNOSIS — Z0000 Encounter for general adult medical examination without abnormal findings: Secondary | ICD-10-CM | POA: Diagnosis not present

## 2023-09-30 DIAGNOSIS — Z1331 Encounter for screening for depression: Secondary | ICD-10-CM | POA: Diagnosis not present

## 2023-09-30 DIAGNOSIS — I350 Nonrheumatic aortic (valve) stenosis: Secondary | ICD-10-CM | POA: Diagnosis not present

## 2023-09-30 DIAGNOSIS — M792 Neuralgia and neuritis, unspecified: Secondary | ICD-10-CM | POA: Diagnosis not present

## 2023-09-30 DIAGNOSIS — I10 Essential (primary) hypertension: Secondary | ICD-10-CM | POA: Diagnosis not present

## 2023-09-30 DIAGNOSIS — M810 Age-related osteoporosis without current pathological fracture: Secondary | ICD-10-CM | POA: Diagnosis not present

## 2023-09-30 DIAGNOSIS — F17210 Nicotine dependence, cigarettes, uncomplicated: Secondary | ICD-10-CM | POA: Diagnosis not present

## 2023-09-30 DIAGNOSIS — E782 Mixed hyperlipidemia: Secondary | ICD-10-CM | POA: Diagnosis not present

## 2023-09-30 DIAGNOSIS — J449 Chronic obstructive pulmonary disease, unspecified: Secondary | ICD-10-CM | POA: Diagnosis not present

## 2023-09-30 DIAGNOSIS — I739 Peripheral vascular disease, unspecified: Secondary | ICD-10-CM | POA: Diagnosis not present

## 2023-09-30 DIAGNOSIS — M51369 Other intervertebral disc degeneration, lumbar region without mention of lumbar back pain or lower extremity pain: Secondary | ICD-10-CM | POA: Diagnosis not present

## 2023-09-30 DIAGNOSIS — I6523 Occlusion and stenosis of bilateral carotid arteries: Secondary | ICD-10-CM | POA: Diagnosis not present

## 2023-09-30 DIAGNOSIS — Z79899 Other long term (current) drug therapy: Secondary | ICD-10-CM | POA: Diagnosis not present

## 2023-10-12 ENCOUNTER — Ambulatory Visit (HOSPITAL_COMMUNITY): Payer: Medicare Other | Attending: Cardiology

## 2023-10-12 DIAGNOSIS — I350 Nonrheumatic aortic (valve) stenosis: Secondary | ICD-10-CM | POA: Insufficient documentation

## 2023-10-12 LAB — ECHOCARDIOGRAM COMPLETE
AR max vel: 0.97 cm2
AV Area VTI: 0.96 cm2
AV Area mean vel: 0.92 cm2
AV Mean grad: 52 mm[Hg]
AV Peak grad: 31.7 mm[Hg]
Ao pk vel: 2.82 m/s
Area-P 1/2: 3.16 cm2
MV VTI: 1.36 cm2
P 1/2 time: 640 ms
S' Lateral: 2.4 cm

## 2023-10-20 ENCOUNTER — Encounter: Payer: Self-pay | Admitting: Pulmonary Disease

## 2023-10-20 ENCOUNTER — Ambulatory Visit: Payer: Medicare Other | Attending: Cardiovascular Disease | Admitting: Cardiovascular Disease

## 2023-10-20 ENCOUNTER — Encounter: Payer: Self-pay | Admitting: Cardiovascular Disease

## 2023-10-20 VITALS — BP 140/56 | HR 77 | Ht 60.0 in | Wt 105.0 lb

## 2023-10-20 DIAGNOSIS — Z720 Tobacco use: Secondary | ICD-10-CM | POA: Insufficient documentation

## 2023-10-20 DIAGNOSIS — E785 Hyperlipidemia, unspecified: Secondary | ICD-10-CM | POA: Diagnosis not present

## 2023-10-20 DIAGNOSIS — I10 Essential (primary) hypertension: Secondary | ICD-10-CM | POA: Diagnosis not present

## 2023-10-20 DIAGNOSIS — I739 Peripheral vascular disease, unspecified: Secondary | ICD-10-CM | POA: Insufficient documentation

## 2023-10-20 DIAGNOSIS — I350 Nonrheumatic aortic (valve) stenosis: Secondary | ICD-10-CM | POA: Diagnosis not present

## 2023-10-20 NOTE — Patient Instructions (Signed)
Medication Instructions:  No changes *If you need a refill on your cardiac medications before your next appointment, please call your pharmacy*   Lab Work: None ordered If you have labs (blood work) drawn today and your tests are completely normal, you will receive your results only by: MyChart Message (if you have MyChart) OR A paper copy in the mail If you have any lab test that is abnormal or we need to change your treatment, we will call you to review the results.   Testing/Procedures: None ordered   Follow-Up: At [facility], you and your health needs are our priority.  As part of our continuing mission to provide you with exceptional heart care, we have created designated Provider Care Teams.  These Care Teams include your primary Cardiologist (physician) and Advanced Practice Providers (APPs -  Physician Assistants and Nurse Practitioners) who all work together to provide you with the care you need, when you need it.  We recommend signing up for the patient portal called "MyChart".  Sign up information is provided on this After Visit Summary.  MyChart is used to connect with patients for Virtual Visits (Telemedicine).  Patients are able to view lab/test results, encounter notes, upcoming appointments, etc.  Non-urgent messages can be sent to your provider as well.   To learn more about what you can do with MyChart, go to [email or web address].    Your next appointment:   6 month(s)  Provider:   Dr. Arida  

## 2023-10-20 NOTE — Progress Notes (Signed)
Cardiology Office Note   Date:  10/20/2023   ID:  Elizabeth [name], DOB 30-Jul-1941, MRN [id number]  PCP:  Thana Ates, MD  Cardiologist:   Lorine Bears, MD   Chief Complaint  Patient presents with   Follow-up    12 months post echo.   Headache       History of Present Illness: Elizabeth [name] is a 82 y.o. female who is here today for follow-up visit regarding aortic stenosis.   She has multiple chronic medical conditions including COPD, essential hypertension, hyperlipidemia, prolonged history of tobacco use and history of pancreatitis.  She has strong family history of cardiovascular disease.  She had back surgery earlier this year without complications.  She had severe claudication last year.  She was seen for preoperative cardiovascular evaluation before aortobifemoral bypass.  She had an echocardiogram done which showed normal LV systolic function with mild mitral stenosis and moderate aortic stenosis.  Lexiscan Myoview showed no evidence of ischemia. She underwent aortobifemoral bypass in January.  She was hospitalized in February with pancreatitis. She underwent follow-up echocardiogram recently which showed moderate mitral stenosis, moderate aortic regurgitation and mild to moderate aortic stenosis.  She had an echocardiogram done recently which was suggestive of severe aortic stenosis.  However, the high gradient was only obtained from the supra sternal notch.  She denies change in symptoms.  No worsening dyspnea, chest pain or dizziness.  She was found to have discrepancy between left and right blood pressure with the blood pressure being lower in the left arm.  She denies left arm claudication.  Past Medical History:  Diagnosis Date   Abdominal cramping    possibly irritable bowel syndrome versus adhesions secondary to multiple surgeries and abdominal inflammation and 2009 secondary to diverticulitis and diverticular phlegmon   Allergic rhinitis    Arthritis     Carotid artery disease (HCC)    50-69% RICA, < 50% LICA 10/10/20 [name] ([facility])   Cataract    Chest pain syndrome 2004   with adenosine Cardiolite that was normal   Chronic hoarseness    COPD (chronic obstructive pulmonary disease) (HCC)    Dyspnea    GERD (gastroesophageal reflux disease)    Heart murmur    Mild AS, AI. AV pk grad 18.79 mmHg, mn grad 11.20 mmHg, AVA (VTI) 1.05 cm2 10/10/20, 3 year f/u rec ([facility])   History of kidney stones    History of renal calculi    Hypercholesterolemia    Hypertension    Incontinence    Cough incontinence   Mild depression    Neuromuscular disorder (HCC)    neuropathy bilateral lower extremities   Osteopenia    Peripheral vascular disease (HCC)    with femoral and carotid bruits   PONV (postoperative nausea and vomiting)    Recurrent pancreatitis    Tobacco dependence    Vitamin D deficiency     Past Surgical History:  Procedure Laterality Date   ABDOMINAL AORTOGRAM W/LOWER EXTREMITY N/A 09/18/2021   Procedure: ABDOMINAL AORTOGRAM W/LOWER EXTREMITY;  Surgeon: Victorino Sparrow, MD;  Location: [facility] INVASIVE CV LAB;  Service: Cardiovascular;  Laterality: N/A;   AORTA - BILATERAL FEMORAL ARTERY BYPASS GRAFT Bilateral 12/03/2021   Procedure: AORTOBIFEMORAL BYPASS GRAFT USING A 14 X 7mm HEMASHIELD GOLD GRAFT;  Surgeon: Victorino Sparrow, MD;  Location: [facility] OR;  Service: Vascular;  Laterality: Bilateral;   APPENDECTOMY     BIOPSY  02/24/2022   Procedure: BIOPSY;  Surgeon: Meridee Score,  Netty Starring., MD;  Location: [facility];  Service: Gastroenterology;;   BREAST BIOPSY     CATARACT EXTRACTION, BILATERAL     COLON SURGERY     2009   COSMETIC SURGERY     on neck and eyes   DILATION AND CURETTAGE OF UTERUS     ESOPHAGOGASTRODUODENOSCOPY (EGD) WITH PROPOFOL N/A 02/24/2022   Procedure: ESOPHAGOGASTRODUODENOSCOPY (EGD) WITH PROPOFOL;  Surgeon: Lemar Lofty., MD;  Location: [facility];  Service: Gastroenterology;  Laterality: N/A;   EUS  N/A 02/24/2022   Procedure: UPPER ENDOSCOPIC ULTRASOUND (EUS) RADIAL;  Surgeon: Lemar Lofty., MD;  Location: [facility];  Service: Gastroenterology;  Laterality: N/A;   EYE SURGERY     2014   FEMUR SURGERY  12/2007   left femur surgery--for femoral neck fracture status post fall    FRACTURE SURGERY     broke left leg and left arm- 2012   HOT HEMOSTASIS N/A 02/24/2022   Procedure: HOT HEMOSTASIS (ARGON PLASMA COAGULATION/BICAP);  Surgeon: Lemar Lofty., MD;  Location: [facility];  Service: Gastroenterology;  Laterality: N/A;   LIPOSUCTION     LUMBAR LAMINECTOMY/DECOMPRESSION MICRODISCECTOMY N/A 11/12/2020   Procedure: LAMINECTOMY AND FORAMINOTOMY Lumbar three four, Lumbar four five, Lumbar Five Sacral one;  Surgeon: Tressie Stalker, MD;  Location: [facility] OR;  Service: Neurosurgery;  Laterality: N/A;   OTHER SURGICAL HISTORY     sigmoid colectomy   SHOULDER SURGERY     arthroscopic shoulder surgery    TONSILLECTOMY     TUBAL LIGATION       Current Outpatient Medications  Medication Sig Dispense Refill   amLODipine (NORVASC) 5 MG tablet Take 2.5 mg by mouth daily.     aspirin EC 81 MG tablet Take 81 mg by mouth at bedtime. Swallow whole.     b complex vitamins capsule Take 1 capsule by mouth daily.     cloNIDine (CATAPRES) 0.1 MG tablet Take 0.2 mg by mouth at bedtime.     Coenzyme Q10 100 MG capsule Take 100 mg by mouth every morning.     diphenoxylate-atropine (LOMOTIL) 2.5-0.025 MG tablet Take 1 tablet by mouth 4 (four) times daily as needed.     feeding supplement (ENSURE ENLIVE / ENSURE PLUS) LIQD Take 237 mLs by mouth 3 (three) times daily between meals. 237 mL 12   fluticasone (FLONASE) 50 MCG/ACT nasal spray Place 2 sprays into both nostrils daily. 9.9 mL 0   gabapentin (NEURONTIN) 100 MG capsule Take 2 capsules (200 mg total) by mouth at bedtime. PATIENT NEEDS OFFICE VISIT FOR ADDITIONAL REFILLS (Patient taking differently: Take 100-200 mg by mouth See admin  instructions. Take one capsule (100 mg) by mouth every morning and two capsules (200 mg) at night) 60 capsule 0   INCRUSE ELLIPTA 62.5 MCG/INH AEPB Inhale 1 puff into the lungs daily. 30 each 1   Menthol, Topical Analgesic, (ICY HOT EX) Apply 1 application topically daily as needed (pain).     metoprolol succinate (TOPROL-XL) 25 MG 24 hr tablet Take 25 mg by mouth at bedtime.     Multiple Vitamin (MULTIVITAMIN WITH MINERALS) TABS tablet Take 1 tablet by mouth 2 (two) times daily.     ondansetron (ZOFRAN-ODT) 4 MG disintegrating tablet Take 4 mg by mouth every 8 (eight) hours as needed.     TURMERIC CURCUMIN PO Take 1 capsule by mouth daily. With Ginger     VITAMIN E PO Take 1 capsule by mouth daily.     rosuvastatin (CRESTOR) 40 MG  tablet Take 1 tablet (40 mg total) by mouth daily. 90 tablet 3   VITAMIN D PO Take 1 capsule by mouth daily. (Patient not taking: Reported on 10/20/2023)     No current facility-administered medications for this visit.    Allergies:   Alendronate sodium, Azithromycin, Chantix [varenicline], Other, Pollen extract, Tegretol [carbamazepine], Lisinopril, and Tetracyclines & related    Social History:  The patient  reports that she has been smoking cigarettes. She has a 30 pack-year smoking history. She has never been exposed to tobacco smoke. She has never used smokeless tobacco. She reports current alcohol use. She reports that she does not use drugs.   Family History:  The patient's family history includes Heart disease in her mother; Stroke in her mother.    ROS:  Please see the history of present illness.   Otherwise, review of systems are positive for none.   All other systems are reviewed and negative.    PHYSICAL EXAM: VS:  BP (!) 140/56 (BP Location: Right Arm, Patient Position: Sitting, Cuff Size: Normal)   Pulse 77   Ht 5' (1.524 m)   Wt 105 lb (47.6 kg)   BMI 20.51 kg/m  , BMI Body mass index is 20.51 kg/m. GEN: Well nourished, well developed, in no  acute distress  HEENT: normal  Neck: no JVD,  or masses.  Right carotid bruit Cardiac: RRR; no  rubs, or gallops,no edema .  3 out of 6 crescendo decrescendo systolic murmur in the aortic area which is mid peaking with diminished S2.  The murmur radiates to both carotid arteries. Respiratory:  clear to auscultation bilaterally, normal work of breathing GI: soft, nontender, nondistended, + BS MS: no deformity or atrophy  Skin: warm and dry, no rash Neuro:  Strength and sensation are intact Psych: euthymic mood, full affect Right radial pulse is normal on the right side and +1 on the left.   EKG:  EKG is ordered today. The ekg ordered today demonstrates : Normal sinus rhythm Biatrial enlargement Nonspecific ST and T wave abnormality When compared with ECG of 15-Dec-2022 11:09, Sinus rhythm has replaced Junctional rhythm ST no longer depressed in Lateral leads    Recent Labs: 12/15/2022: ALT 25 12/17/2022: BUN 34; Hemoglobin 10.3; Magnesium 1.8; Platelets 214; Potassium 3.6; Sodium 135 01/15/2023: Creatinine, Ser 1.00    Lipid Panel    Component Value Date/Time   TRIG 118 12/11/2021 2310      Wt Readings from Last 3 Encounters:  10/20/23 105 lb (47.6 kg)  07/31/23 107 lb (48.5 kg)  01/09/23 105 lb (47.6 kg)            No data to display            ASSESSMENT AND PLAN:   1.  Aortic stenosis: Recent echo suggestive of severe aortic stenosis but I suspect it is in the moderate range.  Will see if we can obtain additional views to confirm but otherwise will obtain a follow-up echocardiogram in 6 months.  2.  Peripheral arterial disease: Status post aortobifemoral bypass: Minimal claudication at this time.  Followed by vascular surgery.  3.  Essential hypertension: Blood pressure is controlled on current medications.  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  4.  Hyperlipidemia: Continue  treatment with rosuvastatin 40 mg daily with a target LDL of less than 70.  5.  Tobacco use: The patient knows the importance of smoking cessation  but reports inability to quit.  6.  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  However, she denies left arm claudication.  Recommend checking her blood pressure in the right arm which is more reflective of central aortic pressure.  There is no indication for revascularization at this time.    Disposition:   Follow-up in 6.  Signed,  Lorine Bears, MD  10/20/2023 9:36 AM     [facility]

## 2023-10-29 ENCOUNTER — Ambulatory Visit (HOSPITAL_COMMUNITY): Payer: Medicare Other | Attending: Cardiology

## 2023-11-05 ENCOUNTER — Encounter: Payer: Self-pay | Admitting: Cardiovascular Disease

## 2023-11-18 DIAGNOSIS — Z79899 Other long term (current) drug therapy: Secondary | ICD-10-CM | POA: Diagnosis not present

## 2023-12-08 ENCOUNTER — Encounter: Payer: Self-pay | Admitting: Emergency Medicine

## 2023-12-08 ENCOUNTER — Other Ambulatory Visit: Payer: Self-pay

## 2023-12-08 ENCOUNTER — Ambulatory Visit
Admission: EM | Admit: 2023-12-08 | Discharge: 2023-12-08 | Disposition: A | Discharge status: Home / Self Care | Payer: Medicare Other | Attending: Family Medicine | Admitting: Family Medicine

## 2023-12-08 DIAGNOSIS — J0140 Acute pansinusitis, unspecified: Secondary | ICD-10-CM

## 2023-12-08 MED ORDER — AMOXICILLIN-POT CLAVULANATE 875-125 MG PO TABS: 1.0000 | ORAL_TABLET | Freq: Two times a day (BID) | ORAL | 0 refills | Status: DC

## 2023-12-08 MED ORDER — PREDNISONE 10 MG PO TABS: 10.0000 mg | ORAL_TABLET | Freq: Two times a day (BID) | ORAL | 0 refills | Status: AC

## 2023-12-08 NOTE — ED Triage Notes (Signed)
Pt here for "sinus infection" and not feeling well x 5 days

## 2023-12-08 NOTE — ED Provider Notes (Signed)
 [facility]    CSN: [id number] Arrival date & time: 12/08/23  1133      History   Chief Complaint Chief Complaint  Patient presents with   Nasal Congestion    HPI Elizabeth [name] is a 83 y.o. female.   HPI Patient is today with a 5-day history of worsening nasal congestion, sinus pressure and drainage for throat.  She reports to have progressively worsened over the course of this week.  She had had fever.  Reports a history of chronic sinus infections and she is fully aware how the symptoms can progress.  Patient also has a history of COPD but currently has no lower respiratory symptoms.    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   Recurrent abdominal pain 04/19/2022   History of pancreatitis 04/19/2022   Anemia 04/19/2022   Allergic rhinitis 01/03/2022   Amnesia 01/03/2022   Aortic valve insufficiency 01/03/2022   Cardiac murmur, unspecified 01/03/2022   Cervical radiculopathy 01/03/2022   Chronic fatigue, unspecified 01/03/2022   Disorder of respiratory system 01/03/2022   Dyspepsia 01/03/2022   Hypersomnia 01/03/2022   Occlusion and stenosis of bilateral carotid arteries 01/03/2022   Osteochondropathy 01/03/2022   Colon cancer screening 01/03/2022   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   Paresthesia of skin 01/03/2022   Peripheral neuropathic pain 01/03/2022   Pure hypercholesterolemia 01/03/2022   Sciatica 01/03/2022   Tobacco user 01/03/2022   Ventricular premature depolarization 01/03/2022   Vitamin D deficiency 01/03/2022   Weight loss 01/03/2022   Tendinitis of shoulder 01/01/2022   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   Localized, primary osteoarthritis of shoulder region 01/01/2022   Numbness of lower limb 01/01/2022   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    Shoulder joint pain 01/01/2022   Abdominal fluid collection 12/12/2021   Elevated troponin level not due myocardial infarction 12/12/2021   PVD (peripheral vascular disease) (HCC) 12/03/2021   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   Community acquired pneumonia 04/04/2021   Hyponatremia 04/04/2021   Gastro-esophageal reflux disease without esophagitis 04/04/2021   Mixed hyperlipidemia 04/04/2021   Pain in right foot 01/10/2021   Acute pancreatitis 11/17/2020   COPD (chronic obstructive pulmonary disease) (HCC)    Essential hypertension    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    Past Surgical History:  Procedure Laterality Date   ABDOMINAL AORTOGRAM W/LOWER EXTREMITY N/A 09/18/2021   Procedure: ABDOMINAL AORTOGRAM W/LOWER EXTREMITY;  Surgeon: Lanis Fonda BRAVO, MD;  Location: [facility] INVASIVE CV LAB;  Service: Cardiovascular;  Laterality: N/A;   AORTA - BILATERAL FEMORAL ARTERY BYPASS GRAFT Bilateral 12/03/2021   Procedure: AORTOBIFEMORAL BYPASS GRAFT USING A 14 X 7mm HEMASHIELD GOLD GRAFT;  Surgeon: Lanis Fonda BRAVO, MD;  Location: [facility] OR;  Service: Vascular;  Laterality: Bilateral;   APPENDECTOMY     BIOPSY  02/24/2022   Procedure: BIOPSY;  Surgeon: Wilhelmenia Aloha Raddle., MD;  Location: [facility];  Service: Gastroenterology;;   BREAST BIOPSY     CATARACT EXTRACTION, BILATERAL     COLON SURGERY     2009   COSMETIC SURGERY     on neck and eyes   DILATION AND CURETTAGE OF UTERUS     ESOPHAGOGASTRODUODENOSCOPY (EGD) WITH PROPOFOL  N/A 02/24/2022   Procedure: ESOPHAGOGASTRODUODENOSCOPY (EGD) WITH PROPOFOL ;  Surgeon: Wilhelmenia,  Aloha Raddle., MD;  Location: [facility];  Service: Gastroenterology;  Laterality: N/A;   EUS N/A 02/24/2022   Procedure: UPPER ENDOSCOPIC ULTRASOUND (EUS) RADIAL;  Surgeon: Wilhelmenia Aloha Raddle., MD;  Location: [facility];  Service: Gastroenterology;  Laterality: N/A;    EYE SURGERY     2014   FEMUR SURGERY  12/2007   left femur surgery--for femoral neck fracture status post fall    FRACTURE SURGERY     broke left leg and left arm- 2012   HOT HEMOSTASIS N/A 02/24/2022   Procedure: HOT HEMOSTASIS (ARGON PLASMA COAGULATION/BICAP);  Surgeon: Wilhelmenia Aloha Raddle., MD;  Location: [facility];  Service: Gastroenterology;  Laterality: N/A;   LIPOSUCTION     LUMBAR LAMINECTOMY/DECOMPRESSION MICRODISCECTOMY N/A 11/12/2020   Procedure: LAMINECTOMY AND FORAMINOTOMY Lumbar three four, Lumbar four five, Lumbar Five Sacral one;  Surgeon: Mavis Purchase, MD;  Location: [facility] OR;  Service: Neurosurgery;  Laterality: N/A;   OTHER SURGICAL HISTORY     sigmoid colectomy   SHOULDER SURGERY     arthroscopic shoulder surgery    TONSILLECTOMY     TUBAL LIGATION      OB History   No obstetric history on file.      Home Medications    Prior to Admission medications   Medication Sig Start Date End Date Taking? Authorizing Provider  amoxicillin -clavulanate (AUGMENTIN ) 875-125 MG tablet Take 1 tablet by mouth every 12 (twelve) hours. 12/08/23  Yes Arloa Suzen RAMAN, NP  predniSONE  (DELTASONE ) 10 MG tablet Take 1 tablet (10 mg total) by mouth 2 (two) times daily with a meal for 5 days. 12/08/23 12/13/23 Yes Arloa Suzen RAMAN, NP  amLODipine  (NORVASC ) 5 MG tablet Take 2.5 mg by mouth daily. 10/16/22   [provider]  aspirin  EC 81 MG tablet Take 81 mg by mouth at bedtime. Swallow whole.    [provider]  b complex vitamins capsule Take 1 capsule by mouth daily.    [provider]  cloNIDine  (CATAPRES ) 0.1 MG tablet Take 0.2 mg by mouth at bedtime.    [provider]  Coenzyme Q10 100 MG capsule Take 100 mg by mouth every morning.    [provider]  diphenoxylate-atropine (LOMOTIL) 2.5-0.025 MG tablet Take 1 tablet by mouth 4 (four) times daily as needed. 11/14/22   [provider]  feeding supplement (ENSURE ENLIVE / ENSURE  PLUS) LIQD Take 237 mLs by mouth 3 (three) times daily between meals. 12/18/21   Dickie Begun, MD  fluticasone  (FLONASE ) 50 MCG/ACT nasal spray Place 2 sprays into both nostrils daily. 12/04/22   Lynwood Lenis, PA-C  gabapentin  (NEURONTIN ) 100 MG capsule Take 2 capsules (200 mg total) by mouth at bedtime. PATIENT NEEDS OFFICE VISIT FOR ADDITIONAL REFILLS Patient taking differently: Take 100-200 mg by mouth See admin instructions. Take one capsule (100 mg) by mouth every morning and two capsules (200 mg) at night 07/30/15   Humberto Elspeth LABOR, MD  INCRUSE ELLIPTA  62.5 MCG/INH AEPB Inhale 1 puff into the lungs daily. 11/21/20   Rai, Ripudeep MARLA, MD  Menthol , Topical Analgesic, (ICY HOT EX) Apply 1 application topically daily as needed (pain).    [provider]  metoprolol  succinate (TOPROL -XL) 25 MG 24 hr tablet Take 25 mg by mouth at bedtime.    [provider]  Multiple Vitamin (MULTIVITAMIN WITH MINERALS) TABS tablet Take 1 tablet by mouth 2 (two) times daily.    [provider]  ondansetron  (ZOFRAN -ODT) 4 MG disintegrating tablet Take 4 mg by  mouth every 8 (eight) hours as needed. 11/14/22   [provider]  rosuvastatin  (CRESTOR ) 40 MG tablet Take 1 tablet (40 mg total) by mouth daily. 10/14/22 10/09/23  Darron Deatrice LABOR, MD  TURMERIC CURCUMIN PO Take 1 capsule by mouth daily. With Ginger    [provider]  VITAMIN D PO Take 1 capsule by mouth daily. Patient not taking: Reported on 10/20/2023    [provider]  VITAMIN E PO Take 1 capsule by mouth daily.    [provider]    Family History Family History  Problem Relation Age of Onset   Heart disease Mother    Stroke Mother    Colon cancer Neg Hx    Esophageal cancer Neg Hx    Stomach cancer Neg Hx    Inflammatory bowel disease Neg Hx    Liver disease Neg Hx    Pancreatic cancer Neg Hx    Rectal cancer Neg Hx     Social History Social History   Tobacco Use   Smoking status:  Every Day    Current packs/day: 0.50    Average packs/day: 0.5 packs/day for 60.0 years (30.0 ttl pk-yrs)    Types: Cigarettes    Passive exposure: Never   Smokeless tobacco: Never  Vaping Use   Vaping status: Never Used  Substance Use Topics   Alcohol use: Yes    Alcohol/week: 0.0 standard drinks of alcohol    Comment: Occ glass of wine   Drug use: No     Allergies   Alendronate sodium, Azithromycin , Chantix [varenicline], Other, Pollen extract, Tegretol [carbamazepine], Lisinopril, and Tetracyclines & related   Review of Systems Review of Systems Pertinent negatives listed in HPI   Physical Exam Triage Vital Signs ED Triage Vitals  Encounter Vitals Group     BP      Systolic BP Percentile      Diastolic BP Percentile      Pulse      Resp      Temp      Temp src      SpO2      Weight      Height      Head Circumference      Peak Flow      Pain Score      Pain Loc      Pain Education      Exclude from Growth Chart    No data found.  Updated Vital Signs BP 136/67 (BP Location: Left Arm)   Pulse 87   Temp 97.8 F (36.6 C) (Oral)   Resp 18   SpO2 94%   Visual Acuity Right Eye Distance:   Left Eye Distance:   Bilateral Distance:    Right Eye Near:   Left Eye Near:    Bilateral Near:     Physical Exam Vitals reviewed.  Constitutional:      Appearance: Normal appearance.  HENT:     Head: Normocephalic and atraumatic.     Nose: Mucosal edema, congestion and rhinorrhea present. Rhinorrhea is purulent.     Right Turbinates: Enlarged.     Left Turbinates: Enlarged.     Right Sinus: Maxillary sinus tenderness and frontal sinus tenderness present.     Left Sinus: Maxillary sinus tenderness and frontal sinus tenderness present.     Mouth/Throat:     Pharynx: Oropharynx is clear. Uvula midline.  Eyes:     Extraocular Movements: Extraocular movements intact.     Pupils: Pupils are  equal, round, and reactive to light.  Cardiovascular:     Rate and  Rhythm: Normal rate and regular rhythm.  Pulmonary:     Effort: Pulmonary effort is normal.     Breath sounds: Normal breath sounds.  Musculoskeletal:     Cervical back: Normal range of motion.  Skin:    General: Skin is warm and dry.  Neurological:     General: No focal deficit present.     Mental Status: She is alert and oriented to person, place, and time.    UC Treatments / Results  Labs (all labs ordered are listed, but only abnormal results are displayed) Labs Reviewed - No data to display  EKG   Radiology No results found.  Procedures Procedures (including critical care time)  Medications Ordered in UC Medications - No data to display  Initial Impression / Assessment and Plan / UC Course  I have reviewed the triage vital signs and the nursing notes.  Pertinent labs & imaging results that were available during my care of the patient were reviewed by me and considered in my medical decision making (see chart for details).    Patient with a history of recurrent sinus infection covering today for acute sinusitis with Augmentin  twice daily for 7 days and prednisone  10 mg twice daily for 5 days.  Encourage patient to hydrate well with fluids.  Return precautions given if symptoms worsen or do not improve. Final Clinical Impressions(s) / UC Diagnoses   Final diagnoses:  Acute non-recurrent pansinusitis   Discharge Instructions   None    ED Prescriptions     Medication Sig Dispense Auth. Provider   amoxicillin -clavulanate (AUGMENTIN ) 875-125 MG tablet Take 1 tablet by mouth every 12 (twelve) hours. 14 tablet Arloa Suzen RAMAN, NP   predniSONE  (DELTASONE ) 10 MG tablet Take 1 tablet (10 mg total) by mouth 2 (two) times daily with a meal for 5 days. 10 tablet Arloa Suzen RAMAN, NP      PDMP not reviewed this encounter.   Arloa Suzen RAMAN, NP 12/08/23 1332

## 2023-12-14 DIAGNOSIS — I350 Nonrheumatic aortic (valve) stenosis: Secondary | ICD-10-CM | POA: Diagnosis not present

## 2023-12-14 DIAGNOSIS — R197 Diarrhea, unspecified: Secondary | ICD-10-CM | POA: Diagnosis not present

## 2023-12-14 DIAGNOSIS — J329 Chronic sinusitis, unspecified: Secondary | ICD-10-CM | POA: Diagnosis not present

## 2024-01-04 DIAGNOSIS — L820 Inflamed seborrheic keratosis: Secondary | ICD-10-CM | POA: Diagnosis not present

## 2024-01-04 DIAGNOSIS — D485 Neoplasm of uncertain behavior of skin: Secondary | ICD-10-CM | POA: Diagnosis not present

## 2024-01-04 DIAGNOSIS — L821 Other seborrheic keratosis: Secondary | ICD-10-CM | POA: Diagnosis not present

## 2024-01-04 DIAGNOSIS — Z85828 Personal history of other malignant neoplasm of skin: Secondary | ICD-10-CM | POA: Diagnosis not present

## 2024-01-04 DIAGNOSIS — L570 Actinic keratosis: Secondary | ICD-10-CM | POA: Diagnosis not present

## 2024-01-14 DIAGNOSIS — I739 Peripheral vascular disease, unspecified: Secondary | ICD-10-CM | POA: Diagnosis not present

## 2024-01-14 DIAGNOSIS — I350 Nonrheumatic aortic (valve) stenosis: Secondary | ICD-10-CM | POA: Diagnosis not present

## 2024-01-14 DIAGNOSIS — I10 Essential (primary) hypertension: Secondary | ICD-10-CM | POA: Diagnosis not present

## 2024-01-14 DIAGNOSIS — J449 Chronic obstructive pulmonary disease, unspecified: Secondary | ICD-10-CM | POA: Diagnosis not present

## 2024-02-01 DIAGNOSIS — I350 Nonrheumatic aortic (valve) stenosis: Secondary | ICD-10-CM | POA: Diagnosis not present

## 2024-02-01 DIAGNOSIS — I10 Essential (primary) hypertension: Secondary | ICD-10-CM | POA: Diagnosis not present

## 2024-02-01 DIAGNOSIS — E782 Mixed hyperlipidemia: Secondary | ICD-10-CM | POA: Diagnosis not present

## 2024-02-01 DIAGNOSIS — J449 Chronic obstructive pulmonary disease, unspecified: Secondary | ICD-10-CM | POA: Diagnosis not present

## 2024-02-01 DIAGNOSIS — I739 Peripheral vascular disease, unspecified: Secondary | ICD-10-CM | POA: Diagnosis not present

## 2024-02-12 DIAGNOSIS — I350 Nonrheumatic aortic (valve) stenosis: Secondary | ICD-10-CM | POA: Diagnosis not present

## 2024-02-12 DIAGNOSIS — I739 Peripheral vascular disease, unspecified: Secondary | ICD-10-CM | POA: Diagnosis not present

## 2024-02-12 DIAGNOSIS — I10 Essential (primary) hypertension: Secondary | ICD-10-CM | POA: Diagnosis not present

## 2024-02-12 DIAGNOSIS — J449 Chronic obstructive pulmonary disease, unspecified: Secondary | ICD-10-CM | POA: Diagnosis not present

## 2024-03-02 DIAGNOSIS — J449 Chronic obstructive pulmonary disease, unspecified: Secondary | ICD-10-CM | POA: Diagnosis not present

## 2024-03-02 DIAGNOSIS — E782 Mixed hyperlipidemia: Secondary | ICD-10-CM | POA: Diagnosis not present

## 2024-03-02 DIAGNOSIS — I10 Essential (primary) hypertension: Secondary | ICD-10-CM | POA: Diagnosis not present

## 2024-03-02 DIAGNOSIS — I350 Nonrheumatic aortic (valve) stenosis: Secondary | ICD-10-CM | POA: Diagnosis not present

## 2024-03-02 DIAGNOSIS — I739 Peripheral vascular disease, unspecified: Secondary | ICD-10-CM | POA: Diagnosis not present

## 2024-03-03 DIAGNOSIS — I351 Nonrheumatic aortic (valve) insufficiency: Secondary | ICD-10-CM | POA: Diagnosis not present

## 2024-03-03 DIAGNOSIS — R600 Localized edema: Secondary | ICD-10-CM | POA: Diagnosis not present

## 2024-03-03 DIAGNOSIS — I350 Nonrheumatic aortic (valve) stenosis: Secondary | ICD-10-CM | POA: Diagnosis not present

## 2024-03-13 DIAGNOSIS — J449 Chronic obstructive pulmonary disease, unspecified: Secondary | ICD-10-CM | POA: Diagnosis not present

## 2024-03-13 DIAGNOSIS — I10 Essential (primary) hypertension: Secondary | ICD-10-CM | POA: Diagnosis not present

## 2024-03-13 DIAGNOSIS — I739 Peripheral vascular disease, unspecified: Secondary | ICD-10-CM | POA: Diagnosis not present

## 2024-03-13 DIAGNOSIS — I350 Nonrheumatic aortic (valve) stenosis: Secondary | ICD-10-CM | POA: Diagnosis not present

## 2024-03-24 ENCOUNTER — Encounter: Payer: Self-pay | Admitting: Cardiovascular Disease

## 2024-03-30 ENCOUNTER — Telehealth: Payer: Self-pay | Admitting: Cardiovascular Disease

## 2024-03-30 DIAGNOSIS — I350 Nonrheumatic aortic (valve) stenosis: Secondary | ICD-10-CM | POA: Diagnosis not present

## 2024-03-30 DIAGNOSIS — I739 Peripheral vascular disease, unspecified: Secondary | ICD-10-CM | POA: Diagnosis not present

## 2024-03-30 DIAGNOSIS — M51369 Other intervertebral disc degeneration, lumbar region without mention of lumbar back pain or lower extremity pain: Secondary | ICD-10-CM | POA: Diagnosis not present

## 2024-03-30 DIAGNOSIS — F17210 Nicotine dependence, cigarettes, uncomplicated: Secondary | ICD-10-CM | POA: Diagnosis not present

## 2024-03-30 DIAGNOSIS — M792 Neuralgia and neuritis, unspecified: Secondary | ICD-10-CM | POA: Diagnosis not present

## 2024-03-30 DIAGNOSIS — M810 Age-related osteoporosis without current pathological fracture: Secondary | ICD-10-CM | POA: Diagnosis not present

## 2024-03-30 DIAGNOSIS — J449 Chronic obstructive pulmonary disease, unspecified: Secondary | ICD-10-CM | POA: Diagnosis not present

## 2024-03-30 DIAGNOSIS — I10 Essential (primary) hypertension: Secondary | ICD-10-CM | POA: Diagnosis not present

## 2024-03-30 DIAGNOSIS — R194 Change in bowel habit: Secondary | ICD-10-CM | POA: Diagnosis not present

## 2024-03-30 NOTE — Telephone Encounter (Signed)
 Pt aware order entered and instructed if does  not hear anything in a couple of days to call back to get echo scheduled for June ./cy

## 2024-03-30 NOTE — Telephone Encounter (Signed)
 Pt called in to schedule 6 mon echo. There is no order yet, please advise.

## 2024-04-02 DIAGNOSIS — I10 Essential (primary) hypertension: Secondary | ICD-10-CM | POA: Diagnosis not present

## 2024-04-02 DIAGNOSIS — I739 Peripheral vascular disease, unspecified: Secondary | ICD-10-CM | POA: Diagnosis not present

## 2024-04-02 DIAGNOSIS — I350 Nonrheumatic aortic (valve) stenosis: Secondary | ICD-10-CM | POA: Diagnosis not present

## 2024-04-02 DIAGNOSIS — J449 Chronic obstructive pulmonary disease, unspecified: Secondary | ICD-10-CM | POA: Diagnosis not present

## 2024-04-02 DIAGNOSIS — E782 Mixed hyperlipidemia: Secondary | ICD-10-CM | POA: Diagnosis not present

## 2024-04-12 DIAGNOSIS — I10 Essential (primary) hypertension: Secondary | ICD-10-CM | POA: Diagnosis not present

## 2024-04-12 DIAGNOSIS — J449 Chronic obstructive pulmonary disease, unspecified: Secondary | ICD-10-CM | POA: Diagnosis not present

## 2024-04-12 DIAGNOSIS — I739 Peripheral vascular disease, unspecified: Secondary | ICD-10-CM | POA: Diagnosis not present

## 2024-04-12 DIAGNOSIS — I350 Nonrheumatic aortic (valve) stenosis: Secondary | ICD-10-CM | POA: Diagnosis not present

## 2024-04-26 DIAGNOSIS — M4316 Spondylolisthesis, lumbar region: Secondary | ICD-10-CM | POA: Diagnosis not present

## 2024-04-26 DIAGNOSIS — R1032 Left lower quadrant pain: Secondary | ICD-10-CM | POA: Diagnosis not present

## 2024-04-26 DIAGNOSIS — M47817 Spondylosis without myelopathy or radiculopathy, lumbosacral region: Secondary | ICD-10-CM | POA: Diagnosis not present

## 2024-04-26 DIAGNOSIS — M25552 Pain in left hip: Secondary | ICD-10-CM | POA: Diagnosis not present

## 2024-05-02 DIAGNOSIS — I739 Peripheral vascular disease, unspecified: Secondary | ICD-10-CM | POA: Diagnosis not present

## 2024-05-02 DIAGNOSIS — E782 Mixed hyperlipidemia: Secondary | ICD-10-CM | POA: Diagnosis not present

## 2024-05-02 DIAGNOSIS — J449 Chronic obstructive pulmonary disease, unspecified: Secondary | ICD-10-CM | POA: Diagnosis not present

## 2024-05-02 DIAGNOSIS — I10 Essential (primary) hypertension: Secondary | ICD-10-CM | POA: Diagnosis not present

## 2024-05-02 DIAGNOSIS — I350 Nonrheumatic aortic (valve) stenosis: Secondary | ICD-10-CM | POA: Diagnosis not present

## 2024-05-10 DIAGNOSIS — M5126 Other intervertebral disc displacement, lumbar region: Secondary | ICD-10-CM | POA: Diagnosis not present

## 2024-05-10 DIAGNOSIS — M4316 Spondylolisthesis, lumbar region: Secondary | ICD-10-CM | POA: Diagnosis not present

## 2024-05-10 DIAGNOSIS — S3210XA Unspecified fracture of sacrum, initial encounter for closed fracture: Secondary | ICD-10-CM | POA: Diagnosis not present

## 2024-05-10 DIAGNOSIS — M47816 Spondylosis without myelopathy or radiculopathy, lumbar region: Secondary | ICD-10-CM | POA: Diagnosis not present

## 2024-05-10 DIAGNOSIS — M48061 Spinal stenosis, lumbar region without neurogenic claudication: Secondary | ICD-10-CM | POA: Diagnosis not present

## 2024-05-11 ENCOUNTER — Ambulatory Visit (HOSPITAL_COMMUNITY)
Admission: RE | Admit: 2024-05-11 | Discharge: 2024-05-11 | Disposition: A | Discharge status: Home / Self Care | Source: Ambulatory Visit | Attending: Cardiovascular Disease | Admitting: Cardiovascular Disease

## 2024-05-11 DIAGNOSIS — I350 Nonrheumatic aortic (valve) stenosis: Secondary | ICD-10-CM | POA: Diagnosis not present

## 2024-05-11 LAB — ECHOCARDIOGRAM COMPLETE
AR max vel: 1.22 cm2
AV Area VTI: 1.52 cm2
AV Area mean vel: 1.44 cm2
AV Mean grad: 19 mmHg
AV Peak grad: 39.4 mmHg
Ao pk vel: 3.14 m/s
Area-P 1/2: 2.41 cm2
S' Lateral: 1.66 cm

## 2024-05-12 ENCOUNTER — Ambulatory Visit: Payer: Self-pay | Admitting: Cardiovascular Disease

## 2024-05-12 DIAGNOSIS — I10 Essential (primary) hypertension: Secondary | ICD-10-CM | POA: Diagnosis not present

## 2024-05-12 DIAGNOSIS — I350 Nonrheumatic aortic (valve) stenosis: Secondary | ICD-10-CM | POA: Diagnosis not present

## 2024-05-12 DIAGNOSIS — J449 Chronic obstructive pulmonary disease, unspecified: Secondary | ICD-10-CM | POA: Diagnosis not present

## 2024-05-12 DIAGNOSIS — I739 Peripheral vascular disease, unspecified: Secondary | ICD-10-CM | POA: Diagnosis not present

## 2024-05-17 ENCOUNTER — Ambulatory Visit: Attending: Cardiovascular Disease | Admitting: Cardiovascular Disease

## 2024-05-17 ENCOUNTER — Encounter: Payer: Self-pay | Admitting: Cardiovascular Disease

## 2024-05-17 VITALS — BP 132/48 | HR 68 | Ht 61.0 in | Wt 106.2 lb

## 2024-05-17 DIAGNOSIS — I350 Nonrheumatic aortic (valve) stenosis: Secondary | ICD-10-CM | POA: Diagnosis present

## 2024-05-17 DIAGNOSIS — E785 Hyperlipidemia, unspecified: Secondary | ICD-10-CM | POA: Insufficient documentation

## 2024-05-17 DIAGNOSIS — I739 Peripheral vascular disease, unspecified: Secondary | ICD-10-CM | POA: Insufficient documentation

## 2024-05-17 DIAGNOSIS — I10 Essential (primary) hypertension: Secondary | ICD-10-CM | POA: Diagnosis present

## 2024-05-17 DIAGNOSIS — Z720 Tobacco use: Secondary | ICD-10-CM | POA: Diagnosis present

## 2024-05-17 MED ORDER — CLONIDINE HCL 0.1 MG PO TABS
0.1000 mg | ORAL_TABLET | Freq: Every day | ORAL | 1 refills | Status: AC
Start: 2024-05-17 — End: ?

## 2024-05-17 NOTE — Progress Notes (Signed)
 Cardiology Office Note   Date:  05/17/2024   ID:  Elizabeth [name], DOB [birth year], MRN [id number]  PCP:  Dwight Trula SQUIBB, MD  Cardiologist:   Deatrice Cage, MD   No chief complaint on file.      History of Present Illness: Elizabeth [name] is a 83 y.o. female who is here today for follow-up visit regarding aortic stenosis.   She has multiple chronic medical conditions including COPD, essential hypertension, hyperlipidemia, prolonged history of tobacco use and history of pancreatitis.  She has strong family history of cardiovascular disease.  She had back surgery earlier this year without complications.  She is status post aortobifemoral bypass in January 2023.  She is followed for aortic stenosis which has been in the moderate range.  Lexiscan  Myoview  was done in December 2022 which showed no evidence of ischemia.  She is also known to have left subclavian artery stenosis which is asymptomatic.  She had recent echocardiogram which showed hyperdynamic LV systolic function with mild mid LV gradient.  Aortic stenosis was stable and in the moderate range with mean gradient of 19 mmHg and valve area of 1.52.  She reports no improvement of lower extremity claudication after she had her aortobifemoral bypass.  In addition, she is struggling with low back pain.  She had an MRI done and is scheduled to see neurosurgery in the near future.  She reports no chest pain or shortness of breath.  She monitors her blood pressure at home and her diastolic pressure is usually low below 50.  No syncope.  Past Medical History:  Diagnosis Date   Abdominal cramping    possibly irritable bowel syndrome versus adhesions secondary to multiple surgeries and abdominal inflammation and 2009 secondary to diverticulitis and diverticular phlegmon   Allergic rhinitis    Arthritis    Carotid artery disease (HCC)    50-69% RICA, < 50% LICA 10/10/20 US  ([facility])   Cataract    Chest pain syndrome 2004   with adenosine  Cardiolite  that was normal   Chronic hoarseness    COPD (chronic obstructive pulmonary disease) (HCC)    Dyspnea    GERD (gastroesophageal reflux disease)    Heart murmur    Mild AS, AI. AV pk grad 18.79 mmHg, mn grad 11.20 mmHg, AVA (VTI) 1.05 cm2 10/10/20, 3 year f/u rec ([facility])   History of kidney stones    History of renal calculi    Hypercholesterolemia    Hypertension    Incontinence    Cough incontinence   Mild depression    Neuromuscular disorder (HCC)    neuropathy bilateral lower extremities   Osteopenia    Peripheral vascular disease (HCC)    with femoral and carotid bruits   PONV (postoperative nausea and vomiting)    Recurrent pancreatitis    Tobacco dependence    Vitamin D deficiency     Past Surgical History:  Procedure Laterality Date   ABDOMINAL AORTOGRAM W/LOWER EXTREMITY N/A 09/18/2021   Procedure: ABDOMINAL AORTOGRAM W/LOWER EXTREMITY;  Surgeon: Lanis Fonda BRAVO, MD;  Location[facility] INVASIVE CV LAB;  Service: Cardiovascular;  Laterality: N/A;   AORTA - BILATERAL FEMORAL ARTERY BYPASS GRAFT Bilateral 12/03/2021   Procedure: AORTOBIFEMORAL BYPASS GRAFT USING A 14 X 7mm HEMASHIELD GOLD GRAFT;  Surgeon: Lanis Fonda BRAVO, MD;  Location: [facility] OR;  Service: Vascular;  Laterality: Bilateral;   APPENDECTOMY     BIOPSY  02/24/2022   Procedure: BIOPSY;  Surgeon: Wilhelmenia Aloha Raddle., MD;  Location: [facility];  Service: Gastroenterology;;   BREAST BIOPSY     CATARACT EXTRACTION, BILATERAL     COLON SURGERY     2009   COSMETIC SURGERY     on neck and eyes   DILATION AND CURETTAGE OF UTERUS     ESOPHAGOGASTRODUODENOSCOPY (EGD) WITH PROPOFOL  N/A 02/24/2022   Procedure: ESOPHAGOGASTRODUODENOSCOPY (EGD) WITH PROPOFOL ;  Surgeon: Wilhelmenia Aloha Raddle., MD;  Location: [facility];  Service: Gastroenterology;  Laterality: N/A;   EUS N/A 02/24/2022   Procedure: UPPER ENDOSCOPIC ULTRASOUND (EUS) RADIAL;  Surgeon: Wilhelmenia Aloha Raddle., MD;  Location: [facility];   Service: Gastroenterology;  Laterality: N/A;   EYE SURGERY     2014   FEMUR SURGERY  12/2007   left femur surgery--for femoral neck fracture status post fall    FRACTURE SURGERY     broke left leg and left arm- 2012   HOT HEMOSTASIS N/A 02/24/2022   Procedure: HOT HEMOSTASIS (ARGON PLASMA COAGULATION/BICAP);  Surgeon: Wilhelmenia Aloha Raddle., MD;  Location: [facility];  Service: Gastroenterology;  Laterality: N/A;   LIPOSUCTION     LUMBAR LAMINECTOMY/DECOMPRESSION MICRODISCECTOMY N/A 11/12/2020   Procedure: LAMINECTOMY AND FORAMINOTOMY Lumbar three four, Lumbar four five, Lumbar Five Sacral one;  Surgeon: Mavis Purchase, MD;  Location: [facility] OR;  Service: Neurosurgery;  Laterality: N/A;   OTHER SURGICAL HISTORY     sigmoid colectomy   SHOULDER SURGERY     arthroscopic shoulder surgery    TONSILLECTOMY     TUBAL LIGATION       Current Outpatient Medications  Medication Sig Dispense Refill   amLODipine  (NORVASC ) 5 MG tablet Take 2.5 mg by mouth daily.     amoxicillin -clavulanate (AUGMENTIN ) 875-125 MG tablet Take 1 tablet by mouth every 12 (twelve) hours. 14 tablet 0   aspirin  EC 81 MG tablet Take 81 mg by mouth at bedtime. Swallow whole.     b complex vitamins capsule Take 1 capsule by mouth daily.     Coenzyme Q10 100 MG capsule Take 100 mg by mouth every morning.     diphenoxylate-atropine (LOMOTIL) 2.5-0.025 MG tablet Take 1 tablet by mouth 4 (four) times daily as needed.     feeding supplement (ENSURE ENLIVE / ENSURE PLUS) LIQD Take 237 mLs by mouth 3 (three) times daily between meals. 237 mL 12   fluticasone  (FLONASE ) 50 MCG/ACT nasal spray Place 2 sprays into both nostrils daily. 9.9 mL 0   HYDROcodone -acetaminophen  (NORCO/VICODIN) 5-325 MG tablet Take 1 tablet by mouth every 6 (six) hours as needed for moderate pain (pain score 4-6).     INCRUSE ELLIPTA  62.5 MCG/INH AEPB Inhale 1 puff into the lungs daily. 30 each 1   Menthol , Topical Analgesic, (ICY HOT EX) Apply 1 application  topically daily as needed (pain).     metoprolol  succinate (TOPROL -XL) 25 MG 24 hr tablet Take 25 mg by mouth at bedtime.     Multiple Vitamin (MULTIVITAMIN WITH MINERALS) TABS tablet Take 1 tablet by mouth 2 (two) times daily.     ondansetron  (ZOFRAN -ODT) 4 MG disintegrating tablet Take 4 mg by mouth every 8 (eight) hours as needed.     rosuvastatin  (CRESTOR ) 40 MG tablet Take 1 tablet (40 mg total) by mouth daily. 90 tablet 3   TURMERIC CURCUMIN PO Take 1 capsule by mouth daily. With Ginger     VITAMIN D PO Take 1 capsule by mouth daily.     VITAMIN E PO Take 1 capsule by mouth daily.     acetaminophen  (  TYLENOL ) 500 MG tablet Take 1,000 mg by mouth every 8 (eight) hours as needed for moderate pain (pain score 4-6).     cloNIDine  (CATAPRES ) 0.1 MG tablet Take 1 tablet (0.1 mg total) by mouth daily. 90 tablet 1   Cyanocobalamin  100 MCG LOZG Take 1 tablet by mouth daily at 6 (six) AM.     furosemide  (LASIX ) 20 MG tablet Take 20 mg by mouth daily.     gabapentin  (NEURONTIN ) 100 MG capsule Take 2 capsules (200 mg total) by mouth at bedtime. PATIENT NEEDS OFFICE VISIT FOR ADDITIONAL REFILLS (Patient taking differently: Take 100-200 mg by mouth See admin instructions. Take one capsule (100 mg) by mouth every morning and two capsules (200 mg) at night) 60 capsule 0   No current facility-administered medications for this visit.    Allergies:   Alendronate sodium, Azithromycin , Chantix [varenicline], Other, Pollen extract, Tegretol [carbamazepine], Lisinopril, and Tetracyclines & related    Social History:  The patient  reports that she has been smoking cigarettes. She has a 30 pack-year smoking history. She has never been exposed to tobacco smoke. She has never used smokeless tobacco. She reports current alcohol use. She reports that she does not use drugs.   Family History:  The patient's family history includes Heart disease in her mother; Stroke in her mother.    ROS:  Please see the history of  present illness.   Otherwise, review of systems are positive for none.   All other systems are reviewed and negative.    PHYSICAL EXAM: VS:  BP (!) 132/48   Pulse 68   Ht 5' 1 (1.549 m)   Wt 106 lb 3.2 oz (48.2 kg)   SpO2 97%   BMI 20.07 kg/m  , BMI Body mass index is 20.07 kg/m. GEN: Well nourished, well developed, in no acute distress  HEENT: normal  Neck: no JVD,  or masses.  Right carotid bruit Cardiac: RRR; no  rubs, or gallops,no edema .  3/6 crescendo decrescendo systolic murmur in the aortic area which is mid peaking with diminished S2.  The murmur radiates to both carotid arteries. Respiratory:  clear to auscultation bilaterally, normal work of breathing GI: soft, nontender, nondistended, + BS MS: no deformity or atrophy  Skin: warm and dry, no rash Neuro:  Strength and sensation are intact Psych: euthymic mood, full affect Right radial pulse is normal on the right side and +1 on the left.   EKG:  EKG is ordered today. The ekg ordered today demonstrates : Normal sinus rhythm Biatrial enlargement When compared with ECG of 20-Oct-2023 09:22, No significant change was found    Recent Labs: No results found for requested labs within last 365 days.    Lipid Panel    Component Value Date/Time   TRIG 118 12/11/2021 2310      Wt Readings from Last 3 Encounters:  05/17/24 106 lb 3.2 oz (48.2 kg)  10/20/23 105 lb (47.6 kg)  07/31/23 107 lb (48.5 kg)            No data to display            ASSESSMENT AND PLAN:   1.  Aortic stenosis: This is stable and is in the moderate range.  The plan is to repeat echocardiogram in 1 year.  2.  Peripheral arterial disease: Status post aortobifemoral bypass.   3.  Essential hypertension: She has been having intermittent low blood pressure readings.  Will decrease clonidine  to 0.1 mg once  daily.  Low diastolic blood pressure could be partially related to mild to moderate aortic insufficiency.  4.  Hyperlipidemia:  Continue treatment with rosuvastatin  40 mg daily with a target LDL of less than 70.  5.  Tobacco use: She reports inability to quit smoking.  6.  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  She is asymptomatic.  Recommend checking her blood pressure in the right arm which is more reflective of central aortic pressure.  There is no indication for revascularization at this time.    Disposition:   Follow-up in 6.  Signed,  Deatrice Cage, MD  05/17/2024 1:12 PM    [facility]

## 2024-05-17 NOTE — Patient Instructions (Signed)
 Medication Instructions:  DECREASE the Clonidine  to 0.1 mg once daily  *If you need a refill on your cardiac medications before your next appointment, please call your pharmacy*  Lab Work: None ordered If you have labs (blood work) drawn today and your tests are completely normal, you will receive your results only by: MyChart Message (if you have MyChart) OR A paper copy in the mail If you have any lab test that is abnormal or we need to change your treatment, we will call you to review the results.  Testing/Procedures: None ordered  Follow-Up: At [facility], you and your health needs are our priority.  As part of our continuing mission to provide you with exceptional heart care, our providers are all part of one team.  This team includes your primary Cardiologist (physician) and Advanced Practice Providers or APPs (Physician Assistants and Nurse Practitioners) who all work together to provide you with the care you need, when you need it.  Your next appointment:   6 month(s)  Provider:   Dr. Darron  We recommend signing up for the patient portal called MyChart.  Sign up information is provided on this After Visit Summary.  MyChart is used to connect with patients for Virtual Visits (Telemedicine).  Patients are able to view lab/test results, encounter notes, upcoming appointments, etc.  Non-urgent messages can be sent to your provider as well.   To learn more about what you can do with MyChart, go to [email or web address].

## 2024-05-24 DIAGNOSIS — M5126 Other intervertebral disc displacement, lumbar region: Secondary | ICD-10-CM | POA: Diagnosis not present

## 2024-05-24 DIAGNOSIS — M5416 Radiculopathy, lumbar region: Secondary | ICD-10-CM | POA: Diagnosis not present

## 2024-05-26 ENCOUNTER — Other Ambulatory Visit: Payer: Self-pay | Admitting: Neurosurgery

## 2024-05-30 ENCOUNTER — Telehealth: Payer: Self-pay | Admitting: Gastroenterology

## 2024-05-30 NOTE — Telephone Encounter (Signed)
 Hard part, is that she her 2 episodes of pancreatitis occurred after surgery. Main thing will be to stay as adequately hydrated as possible. She had some renal insufficiency last year based on her creatinine, so hard to imagine any sort of rectal nonsteroidal medication being able to be offered to patient (and there is no real data about the use of that). Prophylactic ERCP minor papillotomy in the setting of her pancreatic divisum is not recommended at this time. We wish her the best and again I think adequate and appropriate hydration is much as possible in the days leading to her procedure may give her the best opportunity did not have pancreatitis. She can certainly reach out Dr. London team or myself should she develop issues. Best of luck to her.

## 2024-05-30 NOTE — Telephone Encounter (Signed)
 Patient requesting f/u call fro a nurse in regards to her having a pancreatic attack. Please advise.

## 2024-05-30 NOTE — Telephone Encounter (Signed)
 Spoke with patient. She reports that at least twice now after having a surgery she has had pancreatitis. She is scheduled to have a lumbar laminectomy on Thursday and she is wanting to know if the provider has any recommendations she could use to possibly avoid having pancreatitis after this surgery.

## 2024-05-31 ENCOUNTER — Other Ambulatory Visit: Payer: Self-pay

## 2024-05-31 ENCOUNTER — Encounter (HOSPITAL_COMMUNITY): Payer: Self-pay | Admitting: Neurosurgery

## 2024-05-31 NOTE — Progress Notes (Signed)
 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 PAT was initially scheduled for 06/01/24, but her surgery was moved up, so she is a same day work-up.   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 She denied known CAD, although did have coronary calcification on 2017 chest CT for lung cancer screening.    Last cardiology follow-up with Dr. Darron was on 05/17/24. Last echo with stable moderate AS. Repeat TTE in one year planned. Clonidine  decreased due to intermittent hypotension. She does have 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 as there is discrepancy in blood pressure between her left and right arm with diminished pulses in the left arm.  She has been asymptomatic of this so revascularization not felt indicated.  He did advise checking her blood pressure in the right arm to be more reflective of central aortic pressure.  Continue Crestor  for HLD.  She reported inability to quit smoking.  33-month follow-up planned.  She has had postoperative pancreatitis twice, so she contacted GI Dr. Wilhelmenia on 05/30/24 to inquire if any preoperative recommendations. His primary  recommendation was to stay adequately hydrated. He added, Prophylactic ERCP minor papillotomy in the setting of her pancreatic divisum is not recommended at this time. We wish her the best and again I think adequate and appropriate hydration is much as possible in the days leading to her procedure may give her the best opportunity did not have pancreatitis.  She reported last aspirin  dose 05/26/2024.  Anesthesia team to evaluate on the day of surgery.   VS: Ht 5' 1 (1.549 m)   Wt 48.2 kg   BMI 20.07 kg/m  BP Readings from Last 3 Encounters:  05/17/24 (!) 132/48  12/08/23 136/67  10/20/23 (!) 140/56   Pulse Readings from Last 3 Encounters:  05/17/24 68  12/08/23 87  10/20/23 77     PROVIDERS: Dwight Trula SQUIBB, MD is PCP  Darron Grass, MD is cardiologist Lanis Chew, MD is vascular surgeon. One year follow-up advised at 07/31/23 visit.  Mansouraty, Aloha, MD is GI   LABS: For day of surgery as indicated. Most recent results in [facility] include: Lab Results  Component Value Date   WBC 9.6 12/17/2022   HGB 10.3 (L) 12/17/2022   HCT 29.9 (L) 12/17/2022   PLT 214 12/17/2022   GLUCOSE 127 (H) 12/17/2022   TRIG 118 12/11/2021   ALT 25 12/15/2022   AST 47 (H) 12/15/2022   NA 135 12/17/2022   K 3.6 12/17/2022   CL 103 12/17/2022   CREATININE 1.00 01/15/2023   BUN 34 (H) 12/17/2022   CO2 24 12/17/2022   INR 1.3 (H) 12/03/2021     IMAGES: MRI L-spine 05/10/2024 (Canopy/PACS): IMPRESSION: 1. Partially imaged acute-to-subacute left sacral alar fracture. Consider dedicated MRI of the sacrum for further evaluation, as indicated. 2. New broad-based disc  protrusion with caudally extending disc extrusion in the left lateral recess at L3-L4. This disc impinges the descending left L4 nerve root. 3. Severe bilateral foraminal stenosis at L4-5 and L5-S1. 4. Moderate-to-severe bilateral foraminal stenosis at L3-L4.   EKG: 05/17/2024: Normal sinus rhythm Biatrial  enlargement When compared with ECG of 20-Oct-2023 09:22, No significant change was found Confirmed by Darron Grass ([id number]) on 05/17/2024 10:24:30 AM   CV: Echo 05/11/2024: IMPRESSIONS   1. There is a dynamic mid LV gradient of at baseline increasing to  with Valsalva due to hyperdynamic LVF.SABRA Left ventricular ejection  fraction, by estimation, is 60 to 65%. Left ventricular ejection fraction  by 3D volume is 63 %. The left  ventricle has normal function. The left ventricle has no regional wall  motion abnormalities. Left ventricular diastolic parameters are consistent  with Grade I diastolic dysfunction (impaired relaxation). Elevated left  atrial pressure. The average left  ventricular global longitudinal strain is -23.1 %. The global longitudinal  strain is normal.   2. Right ventricular systolic function is normal. The right ventricular  size is normal.   3. The mitral valve is degenerative. Trivial mitral valve regurgitation.  Moderate mitral stenosis. The mean mitral valve gradient is 7.0 mmHg.  Severe mitral annular calcification.   4. The aortic valve is normal in structure. There is moderate  calcification of the aortic valve. There is moderate thickening of the  aortic valve. Aortic valve regurgitation is mild to moderate. Moderate  aortic valve stenosis. Aortic valve area, by VTI  measures 1.52 cm. Aortic valve mean gradient measures 19.0 mmHg. Aortic  valve Vmax measures 3.14 m/s.   5. The inferior vena cava is normal in size with greater than 50%  respiratory variability, suggesting right atrial pressure of 3 mmHg.    Nuclear stress 10/04/2021:   The patient reported no symptoms during the stress test.   ECG is normal. ECG rhythm shows normal sinus rhythm. Resting ECG shows no ST-segment deviation.   No ST deviation was noted. There were no arrhythmias during stress. There were no arrhythmias during recovery. ECG was interpretable and without significant  changes. The ECG was not diagnostic due to pharmacologic protocol.   Overall image quality is good. Image quality affected due to significant extracardiac activity.   LV perfusion is normal. There is no evidence of ischemia. There is no evidence of infarction.   Nuclear stress EF: 69 %. The left ventricular ejection fraction is hyperdynamic (>65%). No evidence of transient ischemic dilation (TID) noted.   The study is normal. The study is low risk.   Prior study available for comparison from 11/22/2013.      Carotid US  10/02/2022 [facility], Report in Canopy/PACS): IMPRESSION: IMPRESSION: 1. Atherosclerotic plaque involving bilateral carotid arteries. Estimated degree of stenosis in the right internal carotid artery is 50-69%. Peak systolic velocity in the right internal carotid artery has increased since 2021 and raising concern for increased stenosis. 2. Estimated degree of stenosis in the left internal carotid artery is less than 50%. 3. Patent vertebral arteries with antegrade flow.       Past Medical History:  Diagnosis Date   Abdominal cramping    possibly irritable bowel syndrome versus adhesions secondary to multiple surgeries and abdominal inflammation and 2009 secondary to diverticulitis and diverticular phlegmon   Allergic rhinitis    Arthritis    Carotid artery disease (HCC)    50-69% RICA, < 50% LICA 10/10/20 US  ([facility])   Cataract  Chest pain syndrome 2004   with adenosine Cardiolite  that was normal   Chronic hoarseness    COPD (chronic obstructive pulmonary disease) (HCC)    Dyspnea    GERD (gastroesophageal reflux disease)    Heart murmur    Mild AS, AI. AV pk grad 18.79 mmHg, mn grad 11.20 mmHg, AVA (VTI) 1.05 cm2 10/10/20, 3 year f/u rec ([facility])   History of kidney stones    History of renal calculi    Hypercholesterolemia    Hypertension    Incontinence    Cough incontinence   Mild depression    Neuromuscular disorder (HCC)    neuropathy  bilateral lower extremities   Osteopenia    Peripheral vascular disease (HCC)    with femoral and carotid bruits   PONV (postoperative nausea and vomiting)    Recurrent pancreatitis    Tobacco dependence    Vitamin D deficiency     Past Surgical History:  Procedure Laterality Date   ABDOMINAL AORTOGRAM W/LOWER EXTREMITY N/A 09/18/2021   Procedure: ABDOMINAL AORTOGRAM W/LOWER EXTREMITY;  Surgeon: Lanis Fonda BRAVO, MD;  Location[facility] CV LAB;  Service: Cardiovascular;  Laterality: N/A;   AORTA - BILATERAL FEMORAL ARTERY BYPASS GRAFT Bilateral 12/03/2021   Procedure: AORTOBIFEMORAL BYPASS GRAFT USING A 14 X 7mm HEMASHIELD GOLD GRAFT;  Surgeon: Lanis Fonda BRAVO, MD;  Location: [facility] OR;  Service: Vascular;  Laterality: Bilateral;   APPENDECTOMY     BIOPSY  02/24/2022   Procedure: BIOPSY;  Surgeon: Wilhelmenia Aloha Raddle., MD;  Location: [facility];  Service: Gastroenterology;;   BREAST BIOPSY     CATARACT EXTRACTION, BILATERAL     COLON SURGERY     2009   COSMETIC SURGERY     on neck and eyes   DILATION AND CURETTAGE OF UTERUS     ESOPHAGOGASTRODUODENOSCOPY (EGD) WITH PROPOFOL  N/A 02/24/2022   Procedure: ESOPHAGOGASTRODUODENOSCOPY (EGD) WITH PROPOFOL ;  Surgeon: Wilhelmenia Aloha Raddle., MD;  Location: [facility];  Service: Gastroenterology;  Laterality: N/A;   EUS N/A 02/24/2022   Procedure: UPPER ENDOSCOPIC ULTRASOUND (EUS) RADIAL;  Surgeon: Wilhelmenia Aloha Raddle., MD;  Location: [facility];  Service: Gastroenterology;  Laterality: N/A;   EYE SURGERY     2014   FEMUR SURGERY  12/2007   left femur surgery--for femoral neck fracture status post fall    FRACTURE SURGERY     broke left leg and left arm- 2012   HOT HEMOSTASIS N/A 02/24/2022   Procedure: HOT HEMOSTASIS (ARGON PLASMA COAGULATION/BICAP);  Surgeon: Wilhelmenia Aloha Raddle., MD;  Location: [facility];  Service: Gastroenterology;  Laterality: N/A;   LIPOSUCTION     LUMBAR LAMINECTOMY/DECOMPRESSION MICRODISCECTOMY N/A  11/12/2020   Procedure: LAMINECTOMY AND FORAMINOTOMY Lumbar three four, Lumbar four five, Lumbar Five Sacral one;  Surgeon: Mavis Purchase, MD;  Location: [facility] OR;  Service: Neurosurgery;  Laterality: N/A;   OTHER SURGICAL HISTORY     sigmoid colectomy   SHOULDER SURGERY     arthroscopic shoulder surgery    TONSILLECTOMY     TUBAL LIGATION      MEDICATIONS: No current facility-administered medications for this encounter.    acetaminophen  (TYLENOL ) 500 MG tablet   amLODipine  (NORVASC ) 10 MG tablet   cholecalciferol (VITAMIN D3) 25 MCG (1000 UNIT) tablet   cloNIDine  (CATAPRES ) 0.1 MG tablet   Coenzyme Q10 100 MG capsule   cyanocobalamin  (VITAMIN B12) 1000 MCG tablet   furosemide  (LASIX ) 20 MG tablet   HYDROcodone -acetaminophen  (NORCO/VICODIN) 5-325 MG tablet   INCRUSE ELLIPTA  62.5 MCG/INH AEPB  metoprolol  succinate (TOPROL -XL) 25 MG 24 hr tablet   Multiple Vitamin (MULTIVITAMIN WITH MINERALS) TABS tablet   rosuvastatin  (CRESTOR ) 40 MG tablet   TURMERIC CURCUMIN PO   VITAMIN E PO   aspirin  EC 81 MG tablet    Isaiah Ruder, PA-C Surgical Short Stay/Anesthesiology [facility] Phone [phone] [facility] Phone [phone] 05/31/2024 4:28 PM

## 2024-05-31 NOTE — Telephone Encounter (Signed)
 Spoke with patient. Discussed provider recommendations. Verbalized understanding.

## 2024-05-31 NOTE — Anesthesia Preprocedure Evaluation (Addendum)
 Anesthesia Evaluation  Patient identified by MRN, date of birth, ID band Patient awake    Reviewed: Allergy & Precautions, NPO status , Patient's Chart, lab work & pertinent test results  History of Anesthesia Complications (+) PONV and history of anesthetic complications  Airway Mallampati: III  TM Distance: >3 FB Neck ROM: Full    Dental  (+) Dental Advisory Given, Upper Dentures   Pulmonary COPD, Current Smoker and Patient abstained from smoking.   Pulmonary exam normal        Cardiovascular hypertension, Pt. on medications + Peripheral Vascular Disease  + Valvular Problems/Murmurs AS and AI  Rhythm:Regular Rate:Normal + Diastolic murmurs  '25 TTE - There is a dynamic mid LV gradient of at baseline increasing to with Valsalva due to hyperdynamic LVF. EF 60-65%. Grade I diastolic dysfunction (impaired relaxation). Elevated left atrial pressure. Trivial MR. Moderate MS. The mean mitral valve gradient is 7.0 mmHg. Aortic valve regurgitation is mild to moderate. Moderate aortic valve stenosis. Aortic valve area, by VTI measures 1.52 cm. Aortic valve mean gradient measures 19.0 mmHg. Aortic valve Vmax measures 3.14 m/s.   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 as there is discrepancy in blood pressure between her left and right arm with diminished pulses in the left arm    Neuro/Psych  PSYCHIATRIC DISORDERS  Depression     Neuromuscular disease    GI/Hepatic Neg liver ROS,GERD  Medicated and Controlled,,  Endo/Other  negative endocrine ROS    Renal/GU negative Renal ROS  Female GU complaint     Musculoskeletal  (+) Arthritis ,    Abdominal   Peds  Hematology negative hematology ROS (+)   Anesthesia Other Findings Chronic hoarseness   Reproductive/Obstetrics                              Anesthesia Physical Anesthesia Plan  ASA: 4  Anesthesia Plan: General   Post-op Pain  Management: Tylenol  PO (pre-op)*   Induction: Intravenous  PONV Risk Score and Plan: 3 and Treatment may vary due to age or medical condition, Ondansetron  and TIVA  Airway Management Planned: Oral ETT  Additional Equipment: ClearSight  Intra-op Plan:   Post-operative Plan: Extubation in OR  Informed Consent: I have reviewed the patients History and Physical, chart, labs and discussed the procedure including the risks, benefits and alternatives for the proposed anesthesia with the patient or authorized representative who has indicated his/her understanding and acceptance.     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

## 2024-05-31 NOTE — Pre-Procedure Instructions (Signed)
-------------    SDW INSTRUCTIONS given:  Your procedure is scheduled on 7/30.  Report to [facility] Main Entrance A at 05:30 A.M., and check in at the Admitting office.  Any questions or running late day of surgery: call [phone]    Remember:  Do not eat or drink after midnight the night before your surgery    Take these medicines the morning of surgery with A SIP OF WATER  Amlodipine , clonidine , incruse             May take these medicines IF NEEDED: tylenol , norco   As of today, STOP taking any Aspirin  (unless otherwise instructed by your surgeon) Aleve, Naproxen, Ibuprofen , Motrin , Advil , Goody's, BC's, all herbal medications, fish oil, and all vitamins.   Do NOT Smoke (Tobacco/Vaping) 24 hours prior to your procedure  If you use a CPAP at night, you may bring all equipment for your overnight stay.     You will be asked to remove any contacts, glasses, piercing's, hearing aid's, dentures/partials prior to surgery. Please bring cases for these items if needed.     Patients discharged the day of surgery will not be allowed to drive home, and someone needs to stay with them for 24 hours.  SURGICAL WAITING ROOM VISITATION Patients may have no more than 2 support people in the waiting area - these visitors may rotate.   Pre-op nurse will coordinate an appropriate time for 1 ADULT support person, who may not rotate, to accompany patient in pre-op.  Children under the age of 54 must have an adult with them who is not the patient and must remain in the main waiting area with an adult.  If the patient needs to stay at the hospital during part of their recovery, the visitor guidelines for inpatient rooms apply.  Please refer to the [facility] website for the visitor guidelines for any additional information.   Special instructions:   [place]- Preparing For Surgery   Please follow these instructions carefully.   Shower the NIGHT BEFORE SURGERY and the MORNING OF  SURGERY with DIAL Soap.   Pat yourself dry with a CLEAN TOWEL.  Wear CLEAN PAJAMAS to bed the night before surgery  Place CLEAN SHEETS on your bed the night of your first shower and DO NOT SLEEP WITH PETS.   Additional instructions for the day of surgery: DO NOT APPLY any lotions, deodorants, cologne, or perfumes.   Do not wear jewelry or makeup Do not wear nail polish, gel polish, artificial nails, or any other type of covering on natural nails (fingers and toes) Do not bring valuables to the hospital. [facility] is not responsible for valuables/personal belongings. Put on clean/comfortable clothes.  Please brush your teeth.  Ask your nurse before applying any prescription medications to the skin.

## 2024-05-31 NOTE — Progress Notes (Signed)
 PCP - Dr. Trula Brim Cardiologist - Dr. Deatrice Cage  PPM/ICD - denies   Chest x-ray - 12/15/22 EKG - 05/17/24 Stress Test - 10/04/21 ECHO - 05/11/24 Cardiac Cath - denies  CPAP - denies  DM- denies  Blood Thinner Instructions: n/a Aspirin  Instructions: hold 5 days. Last dose 7/24.  ERAS Protcol - no, NPO  COVID TEST- n/a  Anesthesia review: yes, cardiac hx  Patient verbally denies any shortness of breath, fever, cough and chest pain during phone call      Questions were answered. Patient verbalized understanding of instructions.

## 2024-06-01 ENCOUNTER — Ambulatory Visit (HOSPITAL_COMMUNITY): Payer: Self-pay | Admitting: Vascular Surgery

## 2024-06-01 ENCOUNTER — Ambulatory Visit (HOSPITAL_COMMUNITY)

## 2024-06-01 ENCOUNTER — Encounter (HOSPITAL_COMMUNITY)
Admission: RE | Disposition: A | Discharge status: Home / Self Care | Payer: Self-pay | Source: Home / Self Care | Attending: Neurosurgery

## 2024-06-01 ENCOUNTER — Inpatient Hospital Stay (HOSPITAL_COMMUNITY)
Admission: RE | Admit: 2024-06-01 | Discharge: 2024-06-01 | Disposition: A | Discharge status: Home / Self Care | Source: Ambulatory Visit

## 2024-06-01 ENCOUNTER — Ambulatory Visit (HOSPITAL_COMMUNITY)
Admission: RE | Admit: 2024-06-01 | Discharge: 2024-06-02 | Disposition: A | Discharge status: Home / Self Care | Attending: Neurosurgery | Admitting: Neurosurgery

## 2024-06-01 ENCOUNTER — Other Ambulatory Visit: Payer: Self-pay

## 2024-06-01 ENCOUNTER — Encounter (HOSPITAL_COMMUNITY): Payer: Self-pay | Admitting: Neurosurgery

## 2024-06-01 ENCOUNTER — Ambulatory Visit (HOSPITAL_BASED_OUTPATIENT_CLINIC_OR_DEPARTMENT_OTHER): Payer: Self-pay | Admitting: Vascular Surgery

## 2024-06-01 DIAGNOSIS — J449 Chronic obstructive pulmonary disease, unspecified: Secondary | ICD-10-CM

## 2024-06-01 DIAGNOSIS — M8580 Other specified disorders of bone density and structure, unspecified site: Secondary | ICD-10-CM | POA: Diagnosis not present

## 2024-06-01 DIAGNOSIS — K219 Gastro-esophageal reflux disease without esophagitis: Secondary | ICD-10-CM | POA: Diagnosis not present

## 2024-06-01 DIAGNOSIS — F17210 Nicotine dependence, cigarettes, uncomplicated: Secondary | ICD-10-CM

## 2024-06-01 DIAGNOSIS — I352 Nonrheumatic aortic (valve) stenosis with insufficiency: Secondary | ICD-10-CM | POA: Diagnosis not present

## 2024-06-01 DIAGNOSIS — I10 Essential (primary) hypertension: Secondary | ICD-10-CM | POA: Insufficient documentation

## 2024-06-01 DIAGNOSIS — M5126 Other intervertebral disc displacement, lumbar region: Secondary | ICD-10-CM | POA: Diagnosis present

## 2024-06-01 DIAGNOSIS — I739 Peripheral vascular disease, unspecified: Secondary | ICD-10-CM | POA: Diagnosis not present

## 2024-06-01 DIAGNOSIS — I080 Rheumatic disorders of both mitral and aortic valves: Secondary | ICD-10-CM | POA: Diagnosis not present

## 2024-06-01 DIAGNOSIS — M5116 Intervertebral disc disorders with radiculopathy, lumbar region: Secondary | ICD-10-CM

## 2024-06-01 HISTORY — PX: LUMBAR LAMINECTOMY/DECOMPRESSION MICRODISCECTOMY: SHX5026

## 2024-06-01 LAB — CBC
HCT: 35.7 % — ABNORMAL LOW (ref 36.0–46.0)
Hemoglobin: 12 g/dL (ref 12.0–15.0)
MCH: 29.9 pg (ref 26.0–34.0)
MCHC: 33.6 g/dL (ref 30.0–36.0)
MCV: 88.8 fL (ref 80.0–100.0)
Platelets: 193 K/uL (ref 150–400)
RBC: 4.02 MIL/uL (ref 3.87–5.11)
RDW: 13.2 % (ref 11.5–15.5)
WBC: 6.7 K/uL (ref 4.0–10.5)
nRBC: 0 % (ref 0.0–0.2)

## 2024-06-01 LAB — BASIC METABOLIC PANEL WITH GFR
Anion gap: 12 (ref 5–15)
BUN: 13 mg/dL (ref 8–23)
CO2: 21 mmol/L — ABNORMAL LOW (ref 22–32)
Calcium: 9.1 mg/dL (ref 8.9–10.3)
Chloride: 107 mmol/L (ref 98–111)
Creatinine, Ser: 0.89 mg/dL (ref 0.44–1.00)
GFR, Estimated: >60 mL/min (ref >60)
Glucose, Bld: 104 mg/dL — ABNORMAL HIGH (ref 70–99)
Potassium: 3.4 mmol/L — ABNORMAL LOW (ref 3.5–5.1)
Sodium: 140 mmol/L (ref 135–145)

## 2024-06-01 SURGERY — LUMBAR LAMINECTOMY/DECOMPRESSION MICRODISCECTOMY 2 LEVELS
Anesthesia: General | Laterality: Left

## 2024-06-01 MED ORDER — CHLORHEXIDINE GLUCONATE CLOTH 2 % EX PADS: 6.0000 | MEDICATED_PAD | Freq: Once | CUTANEOUS | Status: DC

## 2024-06-01 MED ORDER — LACTATED RINGERS IV SOLN: INTRAVENOUS | Status: DC | PRN

## 2024-06-01 MED ORDER — BUPIVACAINE-EPINEPHRINE (PF) 0.5% -1:200000 IJ SOLN
INTRAMUSCULAR | Status: DC | PRN
Start: 2024-06-01 — End: 2024-06-01
  Administered 2024-06-01: 10 mL

## 2024-06-01 MED ORDER — COENZYME Q10 100 MG PO CAPS: 100.0000 mg | ORAL_CAPSULE | Freq: Every morning | ORAL | Status: DC

## 2024-06-01 MED ORDER — FENTANYL CITRATE (PF) 100 MCG/2ML IJ SOLN
INTRAMUSCULAR | Status: AC
  Filled 2024-06-01: qty 2

## 2024-06-01 MED ORDER — SODIUM CHLORIDE 0.9% FLUSH
3.0000 mL | Freq: Two times a day (BID) | INTRAVENOUS | Status: DC
  Administered 2024-06-01: 3 mL via INTRAVENOUS

## 2024-06-01 MED ORDER — ACETAMINOPHEN 500 MG PO TABS
1000.0000 mg | ORAL_TABLET | Freq: Once | ORAL | Status: DC
  Filled 2024-06-01: qty 2

## 2024-06-01 MED ORDER — ACETAMINOPHEN 10 MG/ML IV SOLN
INTRAVENOUS | Status: AC
  Filled 2024-06-01: qty 100

## 2024-06-01 MED ORDER — ACETAMINOPHEN 650 MG RE SUPP: 650.0000 mg | RECTAL | Status: DC | PRN

## 2024-06-01 MED ORDER — UMECLIDINIUM BROMIDE 62.5 MCG/ACT IN AEPB
1.0000 | INHALATION_SPRAY | Freq: Every day | RESPIRATORY_TRACT | Status: DC
  Filled 2024-06-01: qty 7

## 2024-06-01 MED ORDER — OXYCODONE HCL 5 MG PO TABS
ORAL_TABLET | ORAL | Status: AC
  Filled 2024-06-01: qty 1

## 2024-06-01 MED ORDER — PROPOFOL 10 MG/ML IV BOLUS
INTRAVENOUS | Status: DC | PRN
  Administered 2024-06-01: 80 mg via INTRAVENOUS

## 2024-06-01 MED ORDER — OXYCODONE HCL 5 MG/5ML PO SOLN: 5.0000 mg | Freq: Once | ORAL | Status: AC | PRN

## 2024-06-01 MED ORDER — BUPIVACAINE-EPINEPHRINE (PF) 0.5% -1:200000 IJ SOLN
INTRAMUSCULAR | Status: AC
Start: 2024-06-01 — End: 2024-06-01
  Filled 2024-06-01: qty 30

## 2024-06-01 MED ORDER — AMLODIPINE BESYLATE 10 MG PO TABS: 10.0000 mg | ORAL_TABLET | Freq: Every day | ORAL | Status: DC

## 2024-06-01 MED ORDER — BISACODYL 10 MG RE SUPP
10.0000 mg | Freq: Every day | RECTAL | Status: DC | PRN
  Filled 2024-06-01: qty 1

## 2024-06-01 MED ORDER — DOCUSATE SODIUM 100 MG PO CAPS
100.0000 mg | ORAL_CAPSULE | Freq: Two times a day (BID) | ORAL | Status: DC
  Administered 2024-06-01: 100 mg via ORAL
  Filled 2024-06-01: qty 1

## 2024-06-01 MED ORDER — NOREPINEPHRINE 4 MG/250ML-% IV SOLN
INTRAVENOUS | Status: AC
  Filled 2024-06-01: qty 250

## 2024-06-01 MED ORDER — CEFAZOLIN SODIUM-DEXTROSE 2-4 GM/100ML-% IV SOLN
INTRAVENOUS | Status: AC
  Filled 2024-06-01: qty 100

## 2024-06-01 MED ORDER — FENTANYL CITRATE (PF) 250 MCG/5ML IJ SOLN
INTRAMUSCULAR | Status: AC
  Filled 2024-06-01: qty 5

## 2024-06-01 MED ORDER — ACETAMINOPHEN 10 MG/ML IV SOLN
650.0000 mg | Freq: Once | INTRAVENOUS | Status: AC
  Administered 2024-06-01: 650 mg via INTRAVENOUS

## 2024-06-01 MED ORDER — THROMBIN 5000 UNITS EX KIT
PACK | CUTANEOUS | Status: AC
  Filled 2024-06-01: qty 1

## 2024-06-01 MED ORDER — SODIUM CHLORIDE 0.9 % IV SOLN
0.0125 ug/kg/min | INTRAVENOUS | Status: DC
  Filled 2024-06-01: qty 2000

## 2024-06-01 MED ORDER — CHLORHEXIDINE GLUCONATE CLOTH 2 % EX PADS
6.0000 | MEDICATED_PAD | Freq: Once | CUTANEOUS | Status: AC
  Administered 2024-06-01: 6 via TOPICAL

## 2024-06-01 MED ORDER — PHENYLEPHRINE HCL-NACL 20-0.9 MG/250ML-% IV SOLN
INTRAVENOUS | Status: DC | PRN
  Administered 2024-06-01: 40 ug/min via INTRAVENOUS

## 2024-06-01 MED ORDER — CEFAZOLIN SODIUM-DEXTROSE 2-4 GM/100ML-% IV SOLN
2.0000 g | Freq: Three times a day (TID) | INTRAVENOUS | Status: AC
  Administered 2024-06-01 (×2): 2 g via INTRAVENOUS
  Filled 2024-06-01 (×2): qty 100

## 2024-06-01 MED ORDER — BACITRACIN ZINC 500 UNIT/GM EX OINT
TOPICAL_OINTMENT | CUTANEOUS | Status: AC
  Filled 2024-06-01: qty 28.35

## 2024-06-01 MED ORDER — FENTANYL CITRATE (PF) 250 MCG/5ML IJ SOLN
INTRAMUSCULAR | Status: DC | PRN
  Administered 2024-06-01: 50 ug via INTRAVENOUS
  Administered 2024-06-01: 25 ug via INTRAVENOUS
  Administered 2024-06-01: 100 ug via INTRAVENOUS
  Administered 2024-06-01: 50 ug via INTRAVENOUS
  Administered 2024-06-01: 25 ug via INTRAVENOUS

## 2024-06-01 MED ORDER — PHENYLEPHRINE 80 MCG/ML (10ML) SYRINGE FOR IV PUSH (FOR BLOOD PRESSURE SUPPORT)
PREFILLED_SYRINGE | INTRAVENOUS | Status: DC | PRN
  Administered 2024-06-01: 160 ug via INTRAVENOUS

## 2024-06-01 MED ORDER — CEFAZOLIN SODIUM-DEXTROSE 2-4 GM/100ML-% IV SOLN
2.0000 g | INTRAVENOUS | Status: AC
  Administered 2024-06-01: 2 g via INTRAVENOUS

## 2024-06-01 MED ORDER — FENTANYL CITRATE (PF) 100 MCG/2ML IJ SOLN
25.0000 ug | INTRAMUSCULAR | Status: DC | PRN
  Administered 2024-06-01 (×3): 25 ug via INTRAVENOUS

## 2024-06-01 MED ORDER — ONDANSETRON HCL 4 MG PO TABS: 4.0000 mg | ORAL_TABLET | Freq: Four times a day (QID) | ORAL | Status: DC | PRN

## 2024-06-01 MED ORDER — ONDANSETRON HCL 4 MG/2ML IJ SOLN: 4.0000 mg | Freq: Once | INTRAMUSCULAR | Status: DC | PRN

## 2024-06-01 MED ORDER — LACTATED RINGERS IV SOLN: INTRAVENOUS | Status: DC

## 2024-06-01 MED ORDER — ACETAMINOPHEN 500 MG PO TABS
1000.0000 mg | ORAL_TABLET | Freq: Four times a day (QID) | ORAL | Status: DC
  Administered 2024-06-01 – 2024-06-02 (×3): 1000 mg via ORAL
  Filled 2024-06-01 (×3): qty 2

## 2024-06-01 MED ORDER — SODIUM CHLORIDE 0.9% FLUSH: 3.0000 mL | INTRAVENOUS | Status: DC | PRN

## 2024-06-01 MED ORDER — CYCLOBENZAPRINE HCL 5 MG PO TABS: 5.0000 mg | ORAL_TABLET | Freq: Three times a day (TID) | ORAL | Status: DC | PRN

## 2024-06-01 MED ORDER — FUROSEMIDE 20 MG PO TABS: 20.0000 mg | ORAL_TABLET | Freq: Every day | ORAL | Status: DC

## 2024-06-01 MED ORDER — CHLORHEXIDINE GLUCONATE 0.12 % MT SOLN
15.0000 mL | Freq: Once | OROMUCOSAL | Status: AC
  Administered 2024-06-01: 15 mL via OROMUCOSAL
  Filled 2024-06-01: qty 15

## 2024-06-01 MED ORDER — LIDOCAINE 2% (20 MG/ML) 5 ML SYRINGE
INTRAMUSCULAR | Status: DC | PRN
  Administered 2024-06-01: 50 mg via INTRAVENOUS

## 2024-06-01 MED ORDER — PROPOFOL 10 MG/ML IV BOLUS
INTRAVENOUS | Status: AC
  Filled 2024-06-01: qty 20

## 2024-06-01 MED ORDER — ONDANSETRON HCL 4 MG/2ML IJ SOLN
INTRAMUSCULAR | Status: DC | PRN
  Administered 2024-06-01: 4 mg via INTRAVENOUS

## 2024-06-01 MED ORDER — METOPROLOL SUCCINATE ER 25 MG PO TB24
25.0000 mg | ORAL_TABLET | Freq: Every day | ORAL | Status: DC
  Filled 2024-06-01: qty 1

## 2024-06-01 MED ORDER — ZOLPIDEM TARTRATE 5 MG PO TABS: 5.0000 mg | ORAL_TABLET | Freq: Every evening | ORAL | Status: DC | PRN

## 2024-06-01 MED ORDER — ROSUVASTATIN CALCIUM 20 MG PO TABS
40.0000 mg | ORAL_TABLET | Freq: Every evening | ORAL | Status: DC
  Administered 2024-06-01: 40 mg via ORAL
  Filled 2024-06-01: qty 2

## 2024-06-01 MED ORDER — HYDROCODONE-ACETAMINOPHEN 5-325 MG PO TABS: 1.0000 | ORAL_TABLET | ORAL | Status: DC | PRN

## 2024-06-01 MED ORDER — SODIUM CHLORIDE 0.9 % IV SOLN: 250.0000 mL | INTRAVENOUS | Status: DC

## 2024-06-01 MED ORDER — MENTHOL 3 MG MT LOZG: 1.0000 | LOZENGE | OROMUCOSAL | Status: DC | PRN

## 2024-06-01 MED ORDER — PHENOL 1.4 % MT LIQD: 1.0000 | OROMUCOSAL | Status: DC | PRN

## 2024-06-01 MED ORDER — SUGAMMADEX SODIUM 200 MG/2ML IV SOLN
INTRAVENOUS | Status: DC | PRN
  Administered 2024-06-01: 200 mg via INTRAVENOUS

## 2024-06-01 MED ORDER — 0.9 % SODIUM CHLORIDE (POUR BTL) OPTIME
TOPICAL | Status: DC | PRN
  Administered 2024-06-01: 1000 mL

## 2024-06-01 MED ORDER — DEXAMETHASONE SODIUM PHOSPHATE 10 MG/ML IJ SOLN
INTRAMUSCULAR | Status: DC | PRN
  Administered 2024-06-01: 10 mg via INTRAVENOUS

## 2024-06-01 MED ORDER — PHENYLEPHRINE HCL-NACL 20-0.9 MG/250ML-% IV SOLN
INTRAVENOUS | Status: AC
  Filled 2024-06-01: qty 250

## 2024-06-01 MED ORDER — MORPHINE SULFATE (PF) 2 MG/ML IV SOLN: 2.0000 mg | INTRAVENOUS | Status: DC | PRN

## 2024-06-01 MED ORDER — ROCURONIUM BROMIDE 10 MG/ML (PF) SYRINGE
PREFILLED_SYRINGE | INTRAVENOUS | Status: DC | PRN
  Administered 2024-06-01: 50 mg via INTRAVENOUS
  Administered 2024-06-01: 10 mg via INTRAVENOUS

## 2024-06-01 MED ORDER — PROPOFOL 1000 MG/100ML IV EMUL
INTRAVENOUS | Status: AC
  Filled 2024-06-01: qty 100

## 2024-06-01 MED ORDER — THROMBIN 5000 UNITS EX SOLR
CUTANEOUS | Status: DC | PRN
  Administered 2024-06-01: 5 mL via TOPICAL

## 2024-06-01 MED ORDER — SUGAMMADEX SODIUM 200 MG/2ML IV SOLN
INTRAVENOUS | Status: AC
  Filled 2024-06-01: qty 2

## 2024-06-01 MED ORDER — ORAL CARE MOUTH RINSE: 15.0000 mL | Freq: Once | OROMUCOSAL | Status: AC

## 2024-06-01 MED ORDER — ACETAMINOPHEN 325 MG PO TABS: 650.0000 mg | ORAL_TABLET | ORAL | Status: DC | PRN

## 2024-06-01 MED ORDER — KETOROLAC TROMETHAMINE 15 MG/ML IJ SOLN
INTRAMUSCULAR | Status: DC | PRN
  Administered 2024-06-01: 15 mg via INTRAVENOUS

## 2024-06-01 MED ORDER — OXYCODONE HCL 5 MG PO TABS
5.0000 mg | ORAL_TABLET | Freq: Once | ORAL | Status: AC | PRN
  Administered 2024-06-01: 5 mg via ORAL

## 2024-06-01 MED ORDER — EPHEDRINE SULFATE-NACL 50-0.9 MG/10ML-% IV SOSY
PREFILLED_SYRINGE | INTRAVENOUS | Status: DC | PRN
  Administered 2024-06-01: 10 mg via INTRAVENOUS

## 2024-06-01 MED ORDER — CLONIDINE HCL 0.1 MG PO TABS
0.1000 mg | ORAL_TABLET | Freq: Every day | ORAL | Status: DC
Start: 2024-06-02 — End: 2024-06-02
  Filled 2024-06-01: qty 1

## 2024-06-01 MED ORDER — ONDANSETRON HCL 4 MG/2ML IJ SOLN
4.0000 mg | Freq: Four times a day (QID) | INTRAMUSCULAR | Status: DC | PRN
  Administered 2024-06-01: 4 mg via INTRAVENOUS
  Filled 2024-06-01: qty 2

## 2024-06-01 SURGICAL SUPPLY — 41 items
BAG COUNTER SPONGE SURGICOUNT (BAG) ×1 IMPLANT
BAND RUBBER #18 3X1/16 STRL (MISCELLANEOUS) ×2 IMPLANT
BENZOIN TINCTURE PRP APPL 2/3 (GAUZE/BANDAGES/DRESSINGS) ×1 IMPLANT
BLADE CLIPPER SURG (BLADE) IMPLANT
BUR MATCHSTICK NEURO 3.0 LAGG (BURR) ×1 IMPLANT
BUR PRECISION FLUTE 6.0 (BURR) ×1 IMPLANT
CANISTER SUCTION 3000ML PPV (SUCTIONS) ×1 IMPLANT
CLSR STERI-STRIP ANTIMIC 1/2X4 (GAUZE/BANDAGES/DRESSINGS) IMPLANT
DRAPE LAPAROTOMY 100X72X124 (DRAPES) ×1 IMPLANT
DRAPE MICROSCOPE SLANT 54X150 (MISCELLANEOUS) ×1 IMPLANT
DRAPE SURG 17X23 STRL (DRAPES) ×4 IMPLANT
DRSG OPSITE POSTOP 3X4 (GAUZE/BANDAGES/DRESSINGS) IMPLANT
DRSG OPSITE POSTOP 4X6 (GAUZE/BANDAGES/DRESSINGS) ×1 IMPLANT
ELECTRODE BLDE 4.0 EZ CLN MEGD (MISCELLANEOUS) ×1 IMPLANT
ELECTRODE REM PT RTRN 9FT ADLT (ELECTROSURGICAL) ×1 IMPLANT
GAUZE 4X4 16PLY ~~LOC~~+RFID DBL (SPONGE) IMPLANT
GAUZE SPONGE 4X4 12PLY STRL (GAUZE/BANDAGES/DRESSINGS) ×1 IMPLANT
GLOVE BIO SURGEON STRL SZ 6 (GLOVE) ×1 IMPLANT
GLOVE BIO SURGEON STRL SZ8 (GLOVE) ×1 IMPLANT
GLOVE BIO SURGEON STRL SZ8.5 (GLOVE) ×1 IMPLANT
GLOVE BIOGEL PI IND STRL 6.5 (GLOVE) ×1 IMPLANT
GLOVE EXAM NITRILE XL STR (GLOVE) IMPLANT
GOWN STRL REUS W/ TWL LRG LVL3 (GOWN DISPOSABLE) ×1 IMPLANT
GOWN STRL REUS W/ TWL XL LVL3 (GOWN DISPOSABLE) ×1 IMPLANT
GOWN STRL REUS W/TWL 2XL LVL3 (GOWN DISPOSABLE) IMPLANT
HEMOSTAT POWDER KIT SURGIFOAM (HEMOSTASIS) ×1 IMPLANT
KIT BASIN OR (CUSTOM PROCEDURE TRAY) ×1 IMPLANT
KIT TURNOVER KIT B (KITS) ×1 IMPLANT
NDL HYPO 22X1.5 SAFETY MO (MISCELLANEOUS) ×1 IMPLANT
NEEDLE HYPO 22X1.5 SAFETY MO (MISCELLANEOUS) ×1 IMPLANT
NS IRRIG 1000ML POUR BTL (IV SOLUTION) ×1 IMPLANT
PACK LAMINECTOMY NEURO (CUSTOM PROCEDURE TRAY) ×1 IMPLANT
PAD ARMBOARD POSITIONER FOAM (MISCELLANEOUS) ×3 IMPLANT
PATTIES SURGICAL .5 X1 (DISPOSABLE) IMPLANT
SPONGE SURGIFOAM ABS GEL SZ50 (HEMOSTASIS) IMPLANT
STRIP CLOSURE SKIN 1/2X4 (GAUZE/BANDAGES/DRESSINGS) ×1 IMPLANT
SUT VIC AB 1 CT1 18XBRD ANBCTR (SUTURE) ×2 IMPLANT
SUT VIC AB 2-0 CP2 18 (SUTURE) ×2 IMPLANT
TOWEL GREEN STERILE (TOWEL DISPOSABLE) ×1 IMPLANT
TOWEL GREEN STERILE FF (TOWEL DISPOSABLE) ×1 IMPLANT
WATER STERILE IRR 1000ML POUR (IV SOLUTION) ×1 IMPLANT

## 2024-06-01 NOTE — Anesthesia Postprocedure Evaluation (Signed)
 Anesthesia Post Note  Patient: Elizabeth [name]  Procedure(s) Performed: LEFT LUMBAR THREE-FOUR, LUMBAR FOUR-FIVE MICRODISCECTOMY (Left)     Patient location during evaluation: PACU Anesthesia Type: General Level of consciousness: awake and alert Pain management: pain level controlled Vital Signs Assessment: post-procedure vital signs reviewed and stable Respiratory status: spontaneous breathing, nonlabored ventilation and respiratory function stable Cardiovascular status: stable and blood pressure returned to baseline Anesthetic complications: no   No notable events documented.  Last Vitals:  Vitals:   06/01/24 1015 06/01/24 1030  BP: (!) 114/48 (!) 115/50  Pulse: 69 71  Resp: 10 12  Temp:    SpO2: 95% 96%    Last Pain:  Vitals:   06/01/24 1030  PainSc: 6                  Debby FORBES Like

## 2024-06-01 NOTE — Transfer of Care (Signed)
 Immediate Anesthesia Transfer of Care Note  Patient: Elizabeth [name]  Procedure(s) Performed: LEFT LUMBAR THREE-FOUR, LUMBAR FOUR-FIVE MICRODISCECTOMY (Left)  Patient Location: PACU  Anesthesia Type:General  Level of Consciousness: drowsy  Airway & Oxygen  Therapy: Patient Spontanous Breathing  Post-op Assessment: Report given to RN  Post vital signs: Reviewed and stable  Last Vitals:  Vitals Value Taken Time  BP 109/50 06/01/24 09:30  Temp    Pulse 60 06/01/24 09:32  Resp 9 06/01/24 09:32  SpO2 99 % 06/01/24 09:32  Vitals shown include unfiled device data.  Last Pain:  Vitals:   06/01/24 0625  PainSc: 8       Patients Stated Pain Goal: 2 (06/01/24 9374)  Complications: No notable events documented.

## 2024-06-01 NOTE — Progress Notes (Signed)
 PHARMACIST - PHYSICIAN ORDER COMMUNICATION  CONCERNING: P&T Medication Policy on Herbal Medications  DESCRIPTION:  This patient's order for:  Co Q-10  has been noted.  This product(s) is classified as an "herbal" or natural product. Due to a lack of definitive safety studies or FDA approval, nonstandard manufacturing practices, plus the potential risk of unknown drug-drug interactions while on inpatient medications, the Pharmacy and Therapeutics Committee does not permit the use of "herbal" or natural products of this type within [facility].   ACTION TAKEN: The pharmacy department is unable to verify this order at this time. Please reevaluate patient's clinical condition at discharge and address if the herbal or natural product(s) should be resumed at that time.    Genaro Zebedee Calin, RPh 06/01/2024 12:11 PM

## 2024-06-01 NOTE — Op Note (Signed)
 Brief history: The patient is an 83 year old white female whose had previous back surgery.  She has developed recurrent back and left leg pain consistent with a lumbar radiculopathy.  She failed medical management and was worked up with a lumbar MRI which demonstrated her indents at L3-4 and L4-5 on the left.  I discussed the various treatment options with her.  She has decided proceed with surgery.  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 Chyrl Budge  Asst.: Duwaine Beck, NP  Anesthesia: Gen. endotracheal  Estimated blood loss: 50 cc  Drains: None  Complications: None  Description of procedure: The patient was brought to the operating room by the anesthesia team. General endotracheal anesthesia was induced. The patient was turned to the prone position on the Wilson frame. The patient's lumbosacral region was then prepared with Betadine scrub and Betadine solution. Sterile drapes were applied.  I then injected the area to be incised with Marcaine  with epinephrine  solution. I then used a scalpel to make a linear midline incision over the L3-4 and L4-5 intervertebral disc space, incising through the old surgical scar. I then used electrocautery to perform a left sided subperiosteal dissection exposing the spinous process and lamina of L3-4 and the facet at L4-5. We obtained intraoperative radiograph to confirm our location. I then inserted the [facility] retractor for exposure.  We then brought the operative microscope into the field. Under its magnification and illumination we completed the microdissection. I used a high-speed drill to perform a redo laminotomy at L3-4 and L4-5 on the left. I then used a Kerrison punches to widen the laminotomy and removed the remainder of the ligamentum flavum at L3-4 on the  left and the epidural scar tissue at L4-5 on the left. We then used microdissection to free up the thecal sac and the left L4 and L5 nerve root from the epidural tissue. I then used a Kerrison punch to perform a foraminotomy at about the left L4 and L5 nerve root.  The assistant then used the nerve root retractor to gently retract the thecal sac and the left L4 nerve root medially. This exposed the intervertebral disc which was compressing the left L4 and L5 nerve root. We identified the ruptured disc and remove it with the pituitary forceps.  We inspected the intervertebral disc at L3-4.  There were no impending herniations.  We did not perform an intervertebral discectomy.  I then palpated along the ventral surface of the thecal sac and along exit route of the left L4 and L5 nerve root and noted that the neural structures were well decompressed. This completed the decompression.  We then obtained hemostasis using bipolar electrocautery. We irrigated the wound out with bacitracin  solution. We then removed the retractor. We then reapproximated the patient's thoracolumbar fascia with interrupted #1 Vicryl suture. We then reapproximated the patient's subcutaneous tissue with interrupted 2-0 Vicryl suture. We then reapproximated patient's skin with Steri-Strips and benzoin. The was then coated with bacitracin  ointment. The drapes were removed. The patient was subsequently returned to the supine position where they were extubated by the anesthesia team. The patient was then transported to the postanesthesia care unit in stable condition. All sponge instrument and needle counts were reportedly correct at the end of this case.

## 2024-06-01 NOTE — H&P (Signed)
 Subjective: The patient is an 83 year old white female whose had previous back surgeries.  She developed recurrent left buttock and leg pain consistent with a lumbar radiculopathy.  She was worked up with a lumbar MRI which demonstrated a herniated disc at L3-4 and L4-5 on the left.  We discussed the various treatment options with her.  She has decided proceed with surgery.  Past Medical History:  Diagnosis Date   Abdominal cramping    possibly irritable bowel syndrome versus adhesions secondary to multiple surgeries and abdominal inflammation and 2009 secondary to diverticulitis and diverticular phlegmon   Allergic rhinitis    Arthritis    Carotid artery disease (HCC)    50-69% RICA, < 50% LICA 10/10/20 US  ([facility])   Cataract    Chest pain syndrome 2004   with adenosine Cardiolite  that was normal   Chronic hoarseness    COPD (chronic obstructive pulmonary disease) (HCC)    Dyspnea    GERD (gastroesophageal reflux disease)    Heart murmur    Mild AS, AI. AV pk grad 18.79 mmHg, mn grad 11.20 mmHg, AVA (VTI) 1.05 cm2 10/10/20, 3 year f/u rec ([facility])   History of kidney stones    History of renal calculi    Hypercholesterolemia    Hypertension    Incontinence    Cough incontinence   Mild depression    Neuromuscular disorder (HCC)    neuropathy bilateral lower extremities   Osteopenia    Peripheral vascular disease (HCC)    with femoral and carotid bruits   PONV (postoperative nausea and vomiting)    Recurrent pancreatitis    Tobacco dependence    Vitamin D deficiency     Past Surgical History:  Procedure Laterality Date   ABDOMINAL AORTOGRAM W/LOWER EXTREMITY N/A 09/18/2021   Procedure: ABDOMINAL AORTOGRAM W/LOWER EXTREMITY;  Surgeon: Lanis Fonda BRAVO, MD;  Location: [facility] INVASIVE CV LAB;  Service: Cardiovascular;  Laterality: N/A;   AORTA - BILATERAL FEMORAL ARTERY BYPASS GRAFT Bilateral 12/03/2021   Procedure: AORTOBIFEMORAL BYPASS GRAFT USING A 14 X 7mm HEMASHIELD GOLD  GRAFT;  Surgeon: Lanis Fonda BRAVO, MD;  Location: [facility] OR;  Service: Vascular;  Laterality: Bilateral;   APPENDECTOMY     BIOPSY  02/24/2022   Procedure: BIOPSY;  Surgeon: Wilhelmenia Aloha Raddle., MD;  Location: [facility];  Service: Gastroenterology;;   BREAST BIOPSY     CATARACT EXTRACTION, BILATERAL     COLON SURGERY     2009   COSMETIC SURGERY     on neck and eyes   DILATION AND CURETTAGE OF UTERUS     ESOPHAGOGASTRODUODENOSCOPY (EGD) WITH PROPOFOL  N/A 02/24/2022   Procedure: ESOPHAGOGASTRODUODENOSCOPY (EGD) WITH PROPOFOL ;  Surgeon: Wilhelmenia Aloha Raddle., MD;  Location: [facility];  Service: Gastroenterology;  Laterality: N/A;   EUS N/A 02/24/2022   Procedure: UPPER ENDOSCOPIC ULTRASOUND (EUS) RADIAL;  Surgeon: Wilhelmenia Aloha Raddle., MD;  Location: [facility];  Service: Gastroenterology;  Laterality: N/A;   EYE SURGERY     2014   FEMUR SURGERY  12/2007   left femur surgery--for femoral neck fracture status post fall    FRACTURE SURGERY     broke left leg and left arm- 2012   HOT HEMOSTASIS N/A 02/24/2022   Procedure: HOT HEMOSTASIS (ARGON PLASMA COAGULATION/BICAP);  Surgeon: Wilhelmenia Aloha Raddle., MD;  Location: [facility];  Service: Gastroenterology;  Laterality: N/A;   LIPOSUCTION     LUMBAR LAMINECTOMY/DECOMPRESSION MICRODISCECTOMY N/A 11/12/2020   Procedure: LAMINECTOMY AND FORAMINOTOMY Lumbar three four, Lumbar four five, Lumbar  Five Sacral one;  Surgeon: Mavis Purchase, MD;  Location: [facility] OR;  Service: Neurosurgery;  Laterality: N/A;   OTHER SURGICAL HISTORY     sigmoid colectomy   SHOULDER SURGERY     arthroscopic shoulder surgery    TONSILLECTOMY     TUBAL LIGATION      Allergies  Allergen Reactions   Alendronate Sodium Other (See Comments)    loose teeth Fosamax*   Azithromycin  Diarrhea and Nausea And Vomiting   Chantix [Varenicline]     Other reaction(s): N/V/D and flu sxs   Other Other (See Comments)    Chlorexolone - unknown reaction   Pollen Extract      Allergic to tree pollens   Tegretol [Carbamazepine] Swelling   Lisinopril Rash   Tetracyclines & Related Rash    Social History   Tobacco Use   Smoking status: Every Day    Current packs/day: 0.50    Average packs/day: 0.5 packs/day for 60.0 years (30.0 ttl pk-yrs)    Types: Cigarettes    Passive exposure: Never   Smokeless tobacco: Never  Substance Use Topics   Alcohol use: Yes    Alcohol/week: 0.0 standard drinks of alcohol    Comment: Occ glass of wine    Family History  Problem Relation Age of Onset   Heart disease Mother    Stroke Mother    Colon cancer Neg Hx    Esophageal cancer Neg Hx    Stomach cancer Neg Hx    Inflammatory bowel disease Neg Hx    Liver disease Neg Hx    Pancreatic cancer Neg Hx    Rectal cancer Neg Hx    Prior to Admission medications   Medication Sig Start Date End Date Taking? Authorizing Provider  acetaminophen  (TYLENOL ) 500 MG tablet Take 1,000 mg by mouth every 8 (eight) hours as needed for moderate pain (pain score 4-6).   Yes [provider]  amLODipine  (NORVASC ) 10 MG tablet Take 10 mg by mouth daily. 10/16/22  Yes [provider]  aspirin  EC 81 MG tablet Take 81 mg by mouth at bedtime. Swallow whole.   Yes [provider]  cholecalciferol (VITAMIN D3) 25 MCG (1000 UNIT) tablet Take 1,000 Units by mouth daily.   Yes [provider]  cloNIDine  (CATAPRES ) 0.1 MG tablet Take 1 tablet (0.1 mg total) by mouth daily. 05/17/24  Yes Darron Deatrice LABOR, MD  Coenzyme Q10 100 MG capsule Take 100 mg by mouth every morning.   Yes [provider]  cyanocobalamin  (VITAMIN B12) 1000 MCG tablet Take 1,000 mcg by mouth daily.   Yes [provider]  furosemide  (LASIX ) 20 MG tablet Take 20 mg by mouth daily.   Yes [provider]  HYDROcodone -acetaminophen  (NORCO/VICODIN) 5-325 MG tablet Take 1 tablet by mouth every 6 (six) hours as needed for moderate pain (pain score 4-6). 04/26/24  Yes [provider]  INCRUSE ELLIPTA  62.5 MCG/INH AEPB Inhale 1 puff into the lungs daily. 11/21/20  Yes Rai, Ripudeep K, MD  metoprolol  succinate (TOPROL -XL) 25 MG 24 hr tablet Take 25 mg by mouth at bedtime.   Yes [provider]  Multiple Vitamin (MULTIVITAMIN WITH MINERALS) TABS tablet Take 1 tablet by mouth 2 (two) times daily.   Yes [provider]  rosuvastatin  (CRESTOR ) 40 MG tablet Take 40 mg by mouth every evening.   Yes [provider]  TURMERIC CURCUMIN PO Take 1 capsule by mouth daily. With Ginger   Yes [provider]  VITAMIN E PO Take 1 capsule by mouth daily.   Yes [provider]     Review of Systems  Positive ROS: As above  All other systems have been reviewed and were otherwise negative with the exception of those mentioned in the HPI and as above.  Objective: Vital signs in last 24 hours: Temp:  [97.9 F (36.6 C)] 97.9 F (36.6 C) (07/30 0556) Pulse Rate:  [67] 67 (07/30 0556) Resp:  [18] 18 (07/30 0556) BP: (131)/(49) 131/49 (07/30 0556) SpO2:  [98 %] 98 % (07/30 0556) Weight:  [48.2 kg] 48.2 kg (07/30 0556) Estimated body mass index is 20.07 kg/m as calculated from the following:   Height as of this encounter: 5' 1 (1.549 m).   Weight as of this encounter: 48.2 kg.   General Appearance: Alert Head: Normocephalic, without obvious abnormality, atraumatic Eyes: PERRL, conjunctiva/corneas clear, EOM's intact,    Ears: Normal  Throat: Normal  Neck: Supple, Back: Her lumbar incision is well-healed. Lungs: Clear to auscultation bilaterally, respirations unlabored Heart: Regular rate and rhythm, no murmur, rub or gallop Abdomen: Soft, non-tender Extremities: Extremities normal, atraumatic, no cyanosis or edema Skin: unremarkable  NEUROLOGIC:   Mental status: alert and oriented,Motor Exam - grossly normal Sensory Exam - grossly normal Reflexes:  Coordination - grossly normal Gait - grossly normal Balance -  grossly normal Cranial Nerves: I: smell Not tested  II: visual acuity  OS: Normal  OD: Normal   II: visual fields Full to confrontation  II: pupils Equal, round, reactive to light  III,VII: ptosis None  III,IV,VI: extraocular muscles  Full ROM  V: mastication Normal  V: facial light touch sensation  Normal  V,VII: corneal reflex  Present  VII: facial muscle function - upper  Normal  VII: facial muscle function - lower Normal  VIII: hearing Not tested  IX: soft palate elevation  Normal  IX,X: gag reflex Present  XI: trapezius strength  5/5  XI: sternocleidomastoid strength 5/5  XI: neck flexion strength  5/5  XII: tongue strength  Normal    Data Review Lab Results  Component Value Date   WBC 6.7 06/01/2024   HGB 12.0 06/01/2024   HCT 35.7 (L) 06/01/2024   MCV 88.8 06/01/2024   PLT 193 06/01/2024   Lab Results  Component Value Date   NA 140 06/01/2024   K 3.4 (L) 06/01/2024   CL 107 06/01/2024   CO2 21 (L) 06/01/2024   BUN 13 06/01/2024   CREATININE 0.89 06/01/2024   GLUCOSE 104 (H) 06/01/2024   Lab Results  Component Value Date   INR 1.3 (H) 12/03/2021    Assessment/Plan: 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  I reviewed her MRI scan with her and pointed out the abnormalities.  We have discussed the various treatment options including surgery.  We have discussed the surgical treatment option of a left L3-4 and L4-5 discectomy.  I have shown her surgical models.  We have given her surgical pamphlet.  We have discussed the risk, benefits, alternatives, expected postoperative course, and likelihood of achieving our goals with surgery.  I have answered all her questions.  She has decided to proceed with surgery.   Reyes JONETTA Budge 06/01/2024 7:30 AM

## 2024-06-02 ENCOUNTER — Encounter (HOSPITAL_COMMUNITY): Payer: Self-pay | Admitting: Neurosurgery

## 2024-06-02 DIAGNOSIS — M8580 Other specified disorders of bone density and structure, unspecified site: Secondary | ICD-10-CM | POA: Diagnosis not present

## 2024-06-02 DIAGNOSIS — M5116 Intervertebral disc disorders with radiculopathy, lumbar region: Secondary | ICD-10-CM | POA: Diagnosis not present

## 2024-06-02 DIAGNOSIS — I350 Nonrheumatic aortic (valve) stenosis: Secondary | ICD-10-CM | POA: Diagnosis not present

## 2024-06-02 DIAGNOSIS — I080 Rheumatic disorders of both mitral and aortic valves: Secondary | ICD-10-CM | POA: Diagnosis not present

## 2024-06-02 DIAGNOSIS — K219 Gastro-esophageal reflux disease without esophagitis: Secondary | ICD-10-CM | POA: Diagnosis not present

## 2024-06-02 DIAGNOSIS — I739 Peripheral vascular disease, unspecified: Secondary | ICD-10-CM | POA: Diagnosis not present

## 2024-06-02 DIAGNOSIS — E782 Mixed hyperlipidemia: Secondary | ICD-10-CM | POA: Diagnosis not present

## 2024-06-02 DIAGNOSIS — J449 Chronic obstructive pulmonary disease, unspecified: Secondary | ICD-10-CM | POA: Diagnosis not present

## 2024-06-02 DIAGNOSIS — I10 Essential (primary) hypertension: Secondary | ICD-10-CM | POA: Diagnosis not present

## 2024-06-02 LAB — SURGICAL PCR SCREEN
MRSA, PCR: NEGATIVE
Staphylococcus aureus: NEGATIVE

## 2024-06-02 MED ORDER — DOCUSATE SODIUM 100 MG PO CAPS: 100.0000 mg | ORAL_CAPSULE | Freq: Two times a day (BID) | ORAL | 0 refills | Status: AC

## 2024-06-02 MED ORDER — HYDROCODONE-ACETAMINOPHEN 5-325 MG PO TABS: 1.0000 | ORAL_TABLET | ORAL | 0 refills | Status: DC | PRN

## 2024-06-02 NOTE — Plan of Care (Signed)
 Pt doing well. Pt and family given D/C instructions with verbal understanding. Rx's were sent to the pharmacy by MD. Pt's incision is clean and dry with no sign of infection. Pt's IV was removed prior to D/C. Pt D/C'd home via wheelchair per MD order. Pt is stable @ D/C and has no other needs at this time. Rema Fendt, RN

## 2024-06-02 NOTE — Discharge Summary (Signed)
 Physician Discharge Summary  Patient ID: Elizabeth [name] MRN: [id number] DOB/AGE: 84/12/1940 82 y.o.  Admit date: 06/01/2024 Discharge date: 06/02/2024  Admission Diagnoses: Recurrent lumbar herniated disc, lumbar radiculopathy, lumbago  Discharge Diagnoses: The same Principal Problem:   Lumbar herniated disc   Discharged Condition: good  Hospital Course: I performed a redo left L3-4 and L4-5 discectomy on the patient on 06/01/2024.  The surgery went well.  The patient's postoperative course was unremarkable.  On postoperative day #1 the patient requested discharge to home.  She evidence of previous history of pancreatitis but has no abdominal pain presently.  The patient was given verbal and written discharge instructions.  All her questions were answered.  Consults: PT, care management Significant Diagnostic Studies: None Treatments: Redo left L3-4 and L4-5 discectomy using microdissection Discharge Exam: Blood pressure (!) 120/53, pulse 74, temperature 98.5 F (36.9 C), temperature source Oral, resp. rate 18, height 5' 1 (1.549 m), weight 48.2 kg, SpO2 94%. The patient is alert and pleasant.  She looks well.  Her strength is normal  Disposition: Home  Discharge Instructions     Call MD for:  difficulty breathing, headache or visual disturbances   Complete by: As directed    Call MD for:  extreme fatigue   Complete by: As directed    Call MD for:  hives   Complete by: As directed    Call MD for:  persistant dizziness or light-headedness   Complete by: As directed    Call MD for:  persistant nausea and vomiting   Complete by: As directed    Call MD for:  redness, tenderness, or signs of infection (pain, swelling, redness, odor or green/yellow discharge around incision site)   Complete by: As directed    Call MD for:  severe uncontrolled pain   Complete by: As directed    Call MD for:  temperature >100.4   Complete by: As directed    Diet - low sodium heart healthy    Complete by: As directed    Discharge instructions   Complete by: As directed    Call [phone] for a followup appointment. Take a stool softener while you are using pain medications.   Driving Restrictions   Complete by: As directed    Do not drive for 2 weeks.   Increase activity slowly   Complete by: As directed    Lifting restrictions   Complete by: As directed    Do not lift more than 5 pounds. No excessive bending or twisting.   May shower / Bathe   Complete by: As directed    Remove the dressing for 3 days after surgery.  You may shower, but leave the incision alone.   Remove dressing in 48 hours   Complete by: As directed       Allergies as of 06/02/2024       Reactions   Alendronate Sodium Other (See Comments)   loose teeth Fosamax*   Azithromycin  Diarrhea, Nausea And Vomiting   Chantix [varenicline]    Other reaction(s): N/V/D and flu sxs   Other Other (See Comments)   Chlorexolone - unknown reaction   Pollen Extract    Allergic to tree pollens   Tegretol [carbamazepine] Swelling   Lisinopril Rash   Tetracyclines & Related Rash        Medication List     STOP taking these medications    acetaminophen  500 MG tablet Commonly known as: TYLENOL        TAKE these  medications    amLODipine  10 MG tablet Commonly known as: NORVASC  Take 10 mg by mouth daily.   aspirin  EC 81 MG tablet Take 81 mg by mouth at bedtime. Swallow whole.   cholecalciferol 25 MCG (1000 UNIT) tablet Commonly known as: VITAMIN D3 Take 1,000 Units by mouth daily.   cloNIDine  0.1 MG tablet Commonly known as: CATAPRES  Take 1 tablet (0.1 mg total) by mouth daily.   Coenzyme Q10 100 MG capsule Take 100 mg by mouth every morning.   cyanocobalamin  1000 MCG tablet Commonly known as: VITAMIN B12 Take 1,000 mcg by mouth daily.   docusate sodium  100 MG capsule Commonly known as: COLACE Take 1 capsule (100 mg total) by mouth 2 (two) times daily.   furosemide  20 MG  tablet Commonly known as: LASIX  Take 20 mg by mouth daily.   HYDROcodone -acetaminophen  5-325 MG tablet Commonly known as: NORCO/VICODIN Take 1-2 tablets by mouth every 4 (four) hours as needed for moderate pain (pain score 4-6). What changed:  how much to take when to take this   Incruse Ellipta  62.5 MCG/INH Aepb Generic drug: umeclidinium bromide  Inhale 1 puff into the lungs daily.   metoprolol  succinate 25 MG 24 hr tablet Commonly known as: TOPROL -XL Take 25 mg by mouth at bedtime.   multivitamin with minerals Tabs tablet Take 1 tablet by mouth 2 (two) times daily.   rosuvastatin  40 MG tablet Commonly known as: CRESTOR  Take 40 mg by mouth every evening.   TURMERIC CURCUMIN PO Take 1 capsule by mouth daily. With Ginger   VITAMIN E PO Take 1 capsule by mouth daily.         Signed: Reyes JONETTA Budge 06/02/2024, 7:53 AM

## 2024-06-02 NOTE — Evaluation (Signed)
 Physical Therapy Brief Evaluation and Discharge Note  Patient Details Name: Elizabeth [name] MRN: [id number] DOB: 09/09/1941 Today's Date: 06/02/2024   History of Present Illness  Pt is an 83 y/o female who presents s/p re-do L3-5 intervertebral discectomy using micro-dissection on 06/01/2024. PMH significant for CAD, COPD, heart murmur, HTN, BLE neuropathy, osteopenia, PVD.   Clinical Impression  Patient evaluated by Physical Therapy with no further acute PT needs identified. All education has been completed and the patient has no further questions. Pt was able to demonstrate transfers and ambulation with gross modified independence and rollator for support. Pt was also able to ambulate without an AD however prefers the rollator at this time. Pt was educated on precautions, positioning recommendations, appropriate activity progression, and car transfer. See below for any follow-up Physical Therapy or equipment needs. PT is signing off. Thank you for this referral.        PT Assessment Patient does not need any further PT services  Assistance Needed at Discharge  PRN    Equipment Recommendations None recommended by PT  Recommendations for Other Services       Precautions/Restrictions Precautions Precautions: Fall;Back Precaution Booklet Issued: Yes (comment) Recall of Precautions/Restrictions: Intact Precaution/Restrictions Comments: Reviewed handout and pt was cued for precautions during functional mobility. Required Braces or Orthoses:  (No brace needed order) Restrictions Weight Bearing Restrictions Per Provider Order: No        Mobility  Bed Mobility       General bed mobility comments: Pt reports she has been performing log roll since last surgery and declines practicing during session.  Transfers Overall transfer level: Modified independent Equipment used: None               General transfer comment: No unsteadiness or LOB noted. Pt with light to no use of UE's to  stand.    Ambulation/Gait Ambulation/Gait assistance: Modified independent (Device/Increase time) Gait Distance (Feet): 500 Feet Assistive device: Rollator (4 wheels) Gait Pattern/deviations: WFL(Within Functional Limits) Gait Speed: Pace WFL General Gait Details: Pt maintained good posture throughout ambulation. No unsteadiness or LOB noted. Utilized Rollator but also attempted without UE support and was able to ambulate well. Pt reports feeling more comfortable at this time with Rollator vs without.  Home Activity Instructions Home Activity Instructions: Reviewed approprite activity progression, and recommendation for outpatient PT prior to returning to the gym.  Stairs            Modified Rankin (Stroke Patients Only)        Balance Overall balance assessment: Needs assistance Sitting-balance support: Feet supported, No upper extremity supported Sitting balance-Leahy Scale: Fair     Standing balance support: No upper extremity supported, During functional activity Standing balance-Leahy Scale: Fair            Pertinent Vitals/Pain PT - Brief Vital Signs All Vital Signs Stable: Yes Pain Assessment Pain Assessment: Faces Faces Pain Scale: Hurts a little bit Pain Location: back Pain Descriptors / Indicators: Operative site guarding, Sore Pain Intervention(s): Limited activity within patient's tolerance, Monitored during session, Repositioned     Home Living Family/patient expects to be discharged to:: Private residence Living Arrangements: Alone Available Help at Discharge: Family Home Environment: Ramped entrance   Home Equipment: Agricultural consultant (2 wheels);Rollator (4 wheels);Cane - single point;Wheelchair - manual (walk-in tub)        Prior Function Level of Independence: Needs assistance      UE/LE Assessment   UE ROM/Strength/Tone/Coordination: [facility]    LE ROM/Strength/Tone/Coordination:  Impaired LE ROM/Strength/Tone/Coordination Deficits:  Baseline arthritis and peripheral neuropathy in addition to nerve pain on the L prior to surgery. Pt reports no further nerve pain down LLE post-op.    Communication   Communication Communication: No apparent difficulties     Cognition Overall Cognitive Status: Appears within functional limits for tasks assessed/performed       General Comments      Exercises     Assessment/Plan    PT Problem List         PT Visit Diagnosis Unsteadiness on feet (R26.81);Pain    No Skilled PT All education completed;Patient is modified independent with all activity/mobility   Co-evaluation                AMPAC 6 Clicks Help needed turning from your back to your side while in a flat bed without using bedrails?: None Help needed moving from lying on your back to sitting on the side of a flat bed without using bedrails?: None Help needed moving to and from a bed to a chair (including a wheelchair)?: None Help needed standing up from a chair using your arms (e.g., wheelchair or bedside chair)?: None Help needed to walk in hospital room?: None Help needed climbing 3-5 steps with a railing? : None 6 Click Score: 24      End of Session Equipment Utilized During Treatment: Gait belt Activity Tolerance: Patient tolerated treatment well Patient left: in bed;with call bell/phone within reach;with family/visitor present Nurse Communication: Mobility status PT Visit Diagnosis: Unsteadiness on feet (R26.81);Pain Pain - part of body:  (back)     Time: 9096-9078 PT Time Calculation (min) (ACUTE ONLY): 18 min  Charges:   PT Evaluation $PT Eval Low Complexity: 1 Low      Leita Sable, PT, DPT Acute Rehabilitation Services Secure Chat Preferred Office: [phone]   Leita JONETTA Sable  06/02/2024, 10:29 AM

## 2024-06-11 DIAGNOSIS — I350 Nonrheumatic aortic (valve) stenosis: Secondary | ICD-10-CM | POA: Diagnosis not present

## 2024-06-11 DIAGNOSIS — I10 Essential (primary) hypertension: Secondary | ICD-10-CM | POA: Diagnosis not present

## 2024-06-11 DIAGNOSIS — I739 Peripheral vascular disease, unspecified: Secondary | ICD-10-CM | POA: Diagnosis not present

## 2024-06-11 DIAGNOSIS — J449 Chronic obstructive pulmonary disease, unspecified: Secondary | ICD-10-CM | POA: Diagnosis not present

## 2024-06-14 ENCOUNTER — Encounter (HOSPITAL_COMMUNITY): Payer: Self-pay | Admitting: Emergency Medicine

## 2024-06-14 ENCOUNTER — Other Ambulatory Visit: Payer: Self-pay

## 2024-06-14 ENCOUNTER — Inpatient Hospital Stay (HOSPITAL_COMMUNITY)
Admission: EM | Admit: 2024-06-14 | Discharge: 2024-06-17 | DRG: 439 | Disposition: A | Discharge status: Home / Self Care | Attending: Family Medicine | Admitting: Family Medicine

## 2024-06-14 DIAGNOSIS — K859 Acute pancreatitis without necrosis or infection, unspecified: Secondary | ICD-10-CM | POA: Diagnosis not present

## 2024-06-14 DIAGNOSIS — I10 Essential (primary) hypertension: Secondary | ICD-10-CM | POA: Diagnosis present

## 2024-06-14 DIAGNOSIS — Q453 Other congenital malformations of pancreas and pancreatic duct: Secondary | ICD-10-CM

## 2024-06-14 DIAGNOSIS — I2510 Atherosclerotic heart disease of native coronary artery without angina pectoris: Secondary | ICD-10-CM | POA: Diagnosis present

## 2024-06-14 DIAGNOSIS — Z888 Allergy status to other drugs, medicaments and biological substances status: Secondary | ICD-10-CM | POA: Diagnosis not present

## 2024-06-14 DIAGNOSIS — Z79899 Other long term (current) drug therapy: Secondary | ICD-10-CM

## 2024-06-14 DIAGNOSIS — R1110 Vomiting, unspecified: Secondary | ICD-10-CM | POA: Diagnosis not present

## 2024-06-14 DIAGNOSIS — E782 Mixed hyperlipidemia: Secondary | ICD-10-CM | POA: Diagnosis not present

## 2024-06-14 DIAGNOSIS — N281 Cyst of kidney, acquired: Secondary | ICD-10-CM | POA: Diagnosis not present

## 2024-06-14 DIAGNOSIS — E876 Hypokalemia: Secondary | ICD-10-CM | POA: Diagnosis present

## 2024-06-14 DIAGNOSIS — S3210XD Unspecified fracture of sacrum, subsequent encounter for fracture with routine healing: Secondary | ICD-10-CM | POA: Diagnosis not present

## 2024-06-14 DIAGNOSIS — K5730 Diverticulosis of large intestine without perforation or abscess without bleeding: Secondary | ICD-10-CM | POA: Diagnosis not present

## 2024-06-14 DIAGNOSIS — Z8249 Family history of ischemic heart disease and other diseases of the circulatory system: Secondary | ICD-10-CM | POA: Diagnosis not present

## 2024-06-14 DIAGNOSIS — Z8719 Personal history of other diseases of the digestive system: Secondary | ICD-10-CM

## 2024-06-14 DIAGNOSIS — I739 Peripheral vascular disease, unspecified: Secondary | ICD-10-CM | POA: Diagnosis present

## 2024-06-14 DIAGNOSIS — K8689 Other specified diseases of pancreas: Secondary | ICD-10-CM | POA: Diagnosis not present

## 2024-06-14 DIAGNOSIS — K851 Biliary acute pancreatitis without necrosis or infection: Secondary | ICD-10-CM | POA: Diagnosis not present

## 2024-06-14 DIAGNOSIS — I700 Atherosclerosis of aorta: Secondary | ICD-10-CM | POA: Diagnosis present

## 2024-06-14 DIAGNOSIS — K7689 Other specified diseases of liver: Secondary | ICD-10-CM | POA: Diagnosis not present

## 2024-06-14 DIAGNOSIS — Z823 Family history of stroke: Secondary | ICD-10-CM | POA: Diagnosis not present

## 2024-06-14 DIAGNOSIS — K858 Other acute pancreatitis without necrosis or infection: Secondary | ICD-10-CM | POA: Diagnosis not present

## 2024-06-14 DIAGNOSIS — K838 Other specified diseases of biliary tract: Secondary | ICD-10-CM | POA: Diagnosis not present

## 2024-06-14 DIAGNOSIS — K839 Disease of biliary tract, unspecified: Secondary | ICD-10-CM

## 2024-06-14 DIAGNOSIS — I708 Atherosclerosis of other arteries: Secondary | ICD-10-CM | POA: Diagnosis not present

## 2024-06-14 DIAGNOSIS — Z9842 Cataract extraction status, left eye: Secondary | ICD-10-CM | POA: Diagnosis not present

## 2024-06-14 DIAGNOSIS — Z881 Allergy status to other antibiotic agents status: Secondary | ICD-10-CM

## 2024-06-14 DIAGNOSIS — Z87442 Personal history of urinary calculi: Secondary | ICD-10-CM | POA: Diagnosis not present

## 2024-06-14 DIAGNOSIS — F17210 Nicotine dependence, cigarettes, uncomplicated: Secondary | ICD-10-CM | POA: Diagnosis not present

## 2024-06-14 DIAGNOSIS — Z7982 Long term (current) use of aspirin: Secondary | ICD-10-CM

## 2024-06-14 DIAGNOSIS — J439 Emphysema, unspecified: Secondary | ICD-10-CM | POA: Diagnosis not present

## 2024-06-14 DIAGNOSIS — Z9841 Cataract extraction status, right eye: Secondary | ICD-10-CM

## 2024-06-14 DIAGNOSIS — J449 Chronic obstructive pulmonary disease, unspecified: Secondary | ICD-10-CM | POA: Diagnosis present

## 2024-06-14 DIAGNOSIS — I351 Nonrheumatic aortic (valve) insufficiency: Secondary | ICD-10-CM | POA: Diagnosis present

## 2024-06-14 DIAGNOSIS — K828 Other specified diseases of gallbladder: Secondary | ICD-10-CM | POA: Diagnosis not present

## 2024-06-14 DIAGNOSIS — K219 Gastro-esophageal reflux disease without esophagitis: Secondary | ICD-10-CM | POA: Diagnosis present

## 2024-06-14 LAB — COMPREHENSIVE METABOLIC PANEL WITH GFR
ALT: 12 U/L (ref 0–44)
AST: 25 U/L (ref 15–41)
Albumin: 3.3 g/dL — ABNORMAL LOW (ref 3.5–5.0)
Alkaline Phosphatase: 69 U/L (ref 38–126)
Anion gap: 12 (ref 5–15)
BUN: 11 mg/dL (ref 8–23)
CO2: 26 mmol/L (ref 22–32)
Calcium: 9.2 mg/dL (ref 8.9–10.3)
Chloride: 101 mmol/L (ref 98–111)
Creatinine, Ser: 0.87 mg/dL (ref 0.44–1.00)
GFR, Estimated: >60 mL/min (ref >60)
Glucose, Bld: 110 mg/dL — ABNORMAL HIGH (ref 70–99)
Potassium: 3.2 mmol/L — ABNORMAL LOW (ref 3.5–5.1)
Sodium: 139 mmol/L (ref 135–145)
Total Bilirubin: 0.6 mg/dL (ref 0.0–1.2)
Total Protein: 6.7 g/dL (ref 6.5–8.1)

## 2024-06-14 LAB — CBC
HCT: 38.5 % (ref 36.0–46.0)
Hemoglobin: 12.6 g/dL (ref 12.0–15.0)
MCH: 29.9 pg (ref 26.0–34.0)
MCHC: 32.7 g/dL (ref 30.0–36.0)
MCV: 91.2 fL (ref 80.0–100.0)
Platelets: 200 K/uL (ref 150–400)
RBC: 4.22 MIL/uL (ref 3.87–5.11)
RDW: 14 % (ref 11.5–15.5)
WBC: 12 K/uL — ABNORMAL HIGH (ref 4.0–10.5)
nRBC: 0 % (ref 0.0–0.2)

## 2024-06-14 LAB — LIPASE, BLOOD: Lipase: >2800 U/L — ABNORMAL HIGH (ref 11–51)

## 2024-06-14 MED ORDER — FENTANYL CITRATE PF 50 MCG/ML IJ SOSY
50.0000 ug | PREFILLED_SYRINGE | Freq: Once | INTRAMUSCULAR | Status: AC
  Administered 2024-06-14 (×2): 50 ug via INTRAMUSCULAR
  Filled 2024-06-14: qty 1

## 2024-06-14 MED ORDER — ONDANSETRON HCL 4 MG/2ML IJ SOLN
4.0000 mg | Freq: Once | INTRAMUSCULAR | Status: AC
  Administered 2024-06-14 (×2): 4 mg via INTRAVENOUS
  Filled 2024-06-14: qty 2

## 2024-06-14 NOTE — ED Triage Notes (Signed)
 Pt c/o abd pain, worse on the right side; started last night; hx pancreatitis; endorses N/V; denies fevers

## 2024-06-14 NOTE — ED Provider Triage Note (Signed)
 Emergency Medicine Provider Triage Evaluation Note  Elizabeth [name] , a 83 y.o. female  was evaluated in triage.  Pt complains of abdominal pain, nausea, vomiting, history of recurrent pancreatitis, nausea, vomiting.  She also has a history of aortic stenosis, aortic insufficiency.  Chronic low blood pressures in the left arm, but she has been normotensive on the right side.  She recently had a back surgery, reports that she often has a pancreas flare after any kind of procedure.  Review of Systems  Positive: Abdominal pain, nausea, vomiting Negative:   Physical Exam  BP (!) 127/52 (BP Location: Right Arm)   Pulse 80   Temp 98.1 F (36.7 C)   Resp 18   Wt 48.2 kg   SpO2 97%   BMI 20.07 kg/m  Gen:   Awake, no distress   Resp:  Normal effort  MSK:   Moves extremities without difficulty  Other:  Focal ttp in epigastric region  Medical Decision Making  Medically screening exam initiated at 7:29 PM.  Appropriate orders placed.  Elizabeth [name] was informed that the remainder of the evaluation will be completed by another provider, this initial triage assessment does not replace that evaluation, and the importance of remaining in the ED until their evaluation is complete.  Workup initiated in triage    Elizabeth [name] 06/14/24 1929

## 2024-06-14 NOTE — ED Notes (Addendum)
 Pt states L arm always reads low bp due to weak heart valve

## 2024-06-15 ENCOUNTER — Emergency Department (HOSPITAL_COMMUNITY)

## 2024-06-15 ENCOUNTER — Inpatient Hospital Stay (HOSPITAL_COMMUNITY)

## 2024-06-15 DIAGNOSIS — K851 Biliary acute pancreatitis without necrosis or infection: Secondary | ICD-10-CM | POA: Diagnosis not present

## 2024-06-15 DIAGNOSIS — Z888 Allergy status to other drugs, medicaments and biological substances status: Secondary | ICD-10-CM | POA: Diagnosis not present

## 2024-06-15 DIAGNOSIS — K858 Other acute pancreatitis without necrosis or infection: Secondary | ICD-10-CM | POA: Diagnosis not present

## 2024-06-15 DIAGNOSIS — Z881 Allergy status to other antibiotic agents status: Secondary | ICD-10-CM | POA: Diagnosis not present

## 2024-06-15 DIAGNOSIS — I739 Peripheral vascular disease, unspecified: Secondary | ICD-10-CM | POA: Diagnosis present

## 2024-06-15 DIAGNOSIS — K838 Other specified diseases of biliary tract: Secondary | ICD-10-CM | POA: Diagnosis present

## 2024-06-15 DIAGNOSIS — Q453 Other congenital malformations of pancreas and pancreatic duct: Secondary | ICD-10-CM | POA: Diagnosis not present

## 2024-06-15 DIAGNOSIS — Z9842 Cataract extraction status, left eye: Secondary | ICD-10-CM | POA: Diagnosis not present

## 2024-06-15 DIAGNOSIS — F17210 Nicotine dependence, cigarettes, uncomplicated: Secondary | ICD-10-CM | POA: Diagnosis present

## 2024-06-15 DIAGNOSIS — E782 Mixed hyperlipidemia: Secondary | ICD-10-CM | POA: Diagnosis present

## 2024-06-15 DIAGNOSIS — I2510 Atherosclerotic heart disease of native coronary artery without angina pectoris: Secondary | ICD-10-CM | POA: Diagnosis present

## 2024-06-15 DIAGNOSIS — E876 Hypokalemia: Secondary | ICD-10-CM | POA: Diagnosis present

## 2024-06-15 DIAGNOSIS — Z7982 Long term (current) use of aspirin: Secondary | ICD-10-CM | POA: Diagnosis not present

## 2024-06-15 DIAGNOSIS — I708 Atherosclerosis of other arteries: Secondary | ICD-10-CM | POA: Diagnosis present

## 2024-06-15 DIAGNOSIS — J439 Emphysema, unspecified: Secondary | ICD-10-CM | POA: Diagnosis present

## 2024-06-15 DIAGNOSIS — Z9841 Cataract extraction status, right eye: Secondary | ICD-10-CM | POA: Diagnosis not present

## 2024-06-15 DIAGNOSIS — Z79899 Other long term (current) drug therapy: Secondary | ICD-10-CM | POA: Diagnosis not present

## 2024-06-15 DIAGNOSIS — I10 Essential (primary) hypertension: Secondary | ICD-10-CM | POA: Diagnosis present

## 2024-06-15 DIAGNOSIS — I700 Atherosclerosis of aorta: Secondary | ICD-10-CM | POA: Diagnosis present

## 2024-06-15 DIAGNOSIS — K859 Acute pancreatitis without necrosis or infection, unspecified: Secondary | ICD-10-CM | POA: Diagnosis present

## 2024-06-15 DIAGNOSIS — Z823 Family history of stroke: Secondary | ICD-10-CM | POA: Diagnosis not present

## 2024-06-15 DIAGNOSIS — K5730 Diverticulosis of large intestine without perforation or abscess without bleeding: Secondary | ICD-10-CM | POA: Diagnosis present

## 2024-06-15 DIAGNOSIS — Z87442 Personal history of urinary calculi: Secondary | ICD-10-CM | POA: Diagnosis not present

## 2024-06-15 DIAGNOSIS — Z8249 Family history of ischemic heart disease and other diseases of the circulatory system: Secondary | ICD-10-CM | POA: Diagnosis not present

## 2024-06-15 DIAGNOSIS — K219 Gastro-esophageal reflux disease without esophagitis: Secondary | ICD-10-CM | POA: Diagnosis present

## 2024-06-15 LAB — URINALYSIS, ROUTINE W REFLEX MICROSCOPIC
Bacteria, UA: NONE SEEN
Bilirubin Urine: NEGATIVE
Glucose, UA: NEGATIVE mg/dL
Hgb urine dipstick: NEGATIVE
Ketones, ur: NEGATIVE mg/dL
Nitrite: NEGATIVE
Protein, ur: NEGATIVE mg/dL
Specific Gravity, Urine: 1.015 (ref 1.005–1.030)
pH: 7 (ref 5.0–8.0)

## 2024-06-15 MED ORDER — ONDANSETRON HCL 4 MG PO TABS: 4.0000 mg | ORAL_TABLET | Freq: Four times a day (QID) | ORAL | Status: DC | PRN

## 2024-06-15 MED ORDER — ONDANSETRON HCL 4 MG/2ML IJ SOLN
4.0000 mg | Freq: Once | INTRAMUSCULAR | Status: AC
  Administered 2024-06-15 (×2): 4 mg via INTRAVENOUS
  Filled 2024-06-15: qty 2

## 2024-06-15 MED ORDER — FENTANYL CITRATE PF 50 MCG/ML IJ SOSY
50.0000 ug | PREFILLED_SYRINGE | Freq: Once | INTRAMUSCULAR | Status: DC
  Filled 2024-06-15: qty 1

## 2024-06-15 MED ORDER — SODIUM CHLORIDE 0.9 % IV SOLN: INTRAVENOUS | Status: AC

## 2024-06-15 MED ORDER — IOHEXOL 350 MG/ML SOLN
75.0000 mL | Freq: Once | INTRAVENOUS | Status: AC | PRN
  Administered 2024-06-15 (×2): 75 mL via INTRAVENOUS

## 2024-06-15 MED ORDER — FENTANYL CITRATE PF 50 MCG/ML IJ SOSY
50.0000 ug | PREFILLED_SYRINGE | Freq: Once | INTRAMUSCULAR | Status: AC
  Administered 2024-06-15 (×2): 50 ug via INTRAVENOUS

## 2024-06-15 MED ORDER — UMECLIDINIUM BROMIDE 62.5 MCG/ACT IN AEPB
1.0000 | INHALATION_SPRAY | Freq: Every day | RESPIRATORY_TRACT | Status: DC
  Administered 2024-06-16 – 2024-06-17 (×2): 1 via RESPIRATORY_TRACT
  Filled 2024-06-15: qty 7

## 2024-06-15 MED ORDER — ONDANSETRON HCL 4 MG/2ML IJ SOLN: 4.0000 mg | Freq: Four times a day (QID) | INTRAMUSCULAR | Status: DC | PRN

## 2024-06-15 MED ORDER — AMLODIPINE BESYLATE 10 MG PO TABS
10.0000 mg | ORAL_TABLET | Freq: Every day | ORAL | Status: DC
Start: 2024-06-15 — End: 2024-06-17
  Administered 2024-06-17: 10 mg via ORAL
  Filled 2024-06-15 (×2): qty 1

## 2024-06-15 MED ORDER — FUROSEMIDE 20 MG PO TABS: 20.0000 mg | ORAL_TABLET | Freq: Every day | ORAL | Status: DC

## 2024-06-15 MED ORDER — DOCUSATE SODIUM 100 MG PO CAPS
100.0000 mg | ORAL_CAPSULE | Freq: Two times a day (BID) | ORAL | Status: DC
  Administered 2024-06-16 – 2024-06-17 (×2): 100 mg via ORAL
  Filled 2024-06-15 (×4): qty 1

## 2024-06-15 MED ORDER — METOPROLOL SUCCINATE ER 25 MG PO TB24
25.0000 mg | ORAL_TABLET | Freq: Every day | ORAL | Status: DC
  Administered 2024-06-15 – 2024-06-17 (×2): 25 mg via ORAL
  Filled 2024-06-15 (×3): qty 1

## 2024-06-15 MED ORDER — ACETAMINOPHEN 650 MG RE SUPP: 650.0000 mg | Freq: Four times a day (QID) | RECTAL | Status: DC | PRN

## 2024-06-15 MED ORDER — ROSUVASTATIN CALCIUM 20 MG PO TABS
40.0000 mg | ORAL_TABLET | Freq: Every evening | ORAL | Status: DC
  Administered 2024-06-16: 40 mg via ORAL
  Filled 2024-06-15: qty 2

## 2024-06-15 MED ORDER — POTASSIUM CHLORIDE 10 MEQ/100ML IV SOLN
10.0000 meq | INTRAVENOUS | Status: AC
  Administered 2024-06-15 (×4): 10 meq via INTRAVENOUS
  Filled 2024-06-15 (×2): qty 100

## 2024-06-15 MED ORDER — ASPIRIN 81 MG PO TBEC
81.0000 mg | DELAYED_RELEASE_TABLET | Freq: Every day | ORAL | Status: DC
  Administered 2024-06-16 – 2024-06-17 (×2): 81 mg via ORAL
  Filled 2024-06-15 (×3): qty 1

## 2024-06-15 MED ORDER — MORPHINE SULFATE (PF) 2 MG/ML IV SOLN
2.0000 mg | INTRAVENOUS | Status: DC | PRN
  Administered 2024-06-15 (×4): 2 mg via INTRAVENOUS
  Filled 2024-06-15 (×2): qty 1

## 2024-06-15 MED ORDER — CLONIDINE HCL 0.1 MG PO TABS
0.1000 mg | ORAL_TABLET | Freq: Every day | ORAL | Status: DC
Start: 2024-06-15 — End: 2024-06-17
  Administered 2024-06-17: 0.1 mg via ORAL
  Filled 2024-06-15 (×2): qty 1

## 2024-06-15 MED ORDER — ACETAMINOPHEN 325 MG PO TABS: 650.0000 mg | ORAL_TABLET | Freq: Four times a day (QID) | ORAL | Status: DC | PRN

## 2024-06-15 NOTE — ED Notes (Signed)
 CCMD called and notified of move

## 2024-06-15 NOTE — ED Notes (Signed)
Wheeled patient to the bathroom patient did well 

## 2024-06-15 NOTE — ED Notes (Signed)
 Placed the patient back on the monitor patient is resting with call bell in reach

## 2024-06-15 NOTE — ED Notes (Signed)
 CCMD aware

## 2024-06-15 NOTE — TOC Initial Note (Signed)
 Transition of Care [facility]) - Initial/Assessment Note    Patient Details  Name: Elizabeth [name] MRN: [id number] Date of Birth: [birth year]  Transition of Care [facility]) CM/SW Contact:    Lendia Dais, LCSWA Phone Number: 06/15/2024, 3:33 PM  Clinical Narrative:  Pt is from home with grandson. Pt uses a cane and a walker at baseline.  Pt gave permission to contact her granddaughter Elizabeth [name] with updates of her care. Pt states that her son Elizabeth [name] is her HCPOA. Pt voiced no concerns with SDOH needs.  CSW will continue to monitor for dc needs.                 Expected Discharge Plan: Home/Self Care Barriers to Discharge: Continued Medical Work up   Patient Goals and CMS Choice Patient states their goals for this hospitalization and ongoing recovery are:: Better health   Choice offered to / list presented to : NA      Expected Discharge Plan and Services In-house Referral: Clinical Social Work     Living arrangements for the past 2 months: Single Family Home                                      Prior Living Arrangements/Services Living arrangements for the past 2 months: Single Family Home Lives with:: Relatives Patient language and need for interpreter reviewed:: Yes Do you feel safe going back to the place where you live?: Yes      Need for Family Participation in Patient Care: Yes (Comment) Care giver support system in place?: Yes (comment) Current home services: DME (walker, cane) Criminal Activity/Legal Involvement Pertinent to Current Situation/Hospitalization: No - Comment as needed  Activities of Daily Living      Permission Sought/Granted Permission sought to share information with : Family Supports Permission granted to share information with : Yes, Verbal Permission Granted  Share Information with NAME: Elizabeth [name],Elizabeth [name] (Granddaughter)  [phone]           Emotional Assessment Appearance:: Appears stated age Attitude/Demeanor/Rapport: Engaged Affect  (typically observed): Pleasant, Appropriate Orientation: : Oriented to Self, Oriented to Place, Oriented to  Time, Oriented to Situation Alcohol / Substance Use: Not Applicable Psych Involvement: No (comment)  Admission diagnosis:  Acute pancreatitis [K85.90] Acute pancreatitis, unspecified complication status, unspecified pancreatitis type [K85.90] Patient Active Problem List   Diagnosis Date Noted   Lumbar herniated disc 06/01/2024   Dehydration 12/16/2022   Generalized weakness 12/16/2022   AKI (acute kidney injury) (HCC) 12/15/2022   Hypokalemia 12/15/2022   Senile osteoporosis 10/07/2022   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   Recurrent abdominal pain 04/19/2022   History of pancreatitis 04/19/2022   Anemia 04/19/2022   Allergic rhinitis 01/03/2022   Amnesia 01/03/2022   Aortic valve insufficiency 01/03/2022   Cardiac murmur, unspecified 01/03/2022   Cervical radiculopathy 01/03/2022   Chronic fatigue, unspecified 01/03/2022   Disorder of respiratory system 01/03/2022   Dyspepsia 01/03/2022   Hypersomnia 01/03/2022   Occlusion and stenosis of bilateral carotid arteries 01/03/2022   Osteochondropathy 01/03/2022   Colon cancer screening 01/03/2022   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   Paresthesia of skin 01/03/2022   Peripheral neuropathic pain 01/03/2022   Pure hypercholesterolemia 01/03/2022   Sciatica 01/03/2022   Tobacco user 01/03/2022   Ventricular premature depolarization 01/03/2022   Vitamin D deficiency 01/03/2022   Weight loss 01/03/2022   Tendinitis of shoulder 01/01/2022   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   Localized, primary  osteoarthritis of shoulder region 01/01/2022   Numbness of lower limb 01/01/2022   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   Shoulder joint pain 01/01/2022   Abdominal fluid collection 12/12/2021   Elevated troponin level not due myocardial infarction 12/12/2021   PVD (peripheral vascular disease) (HCC) 12/03/2021   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    Community acquired pneumonia 04/04/2021   Hyponatremia 04/04/2021   Gastro-esophageal reflux disease without esophagitis 04/04/2021   Mixed hyperlipidemia 04/04/2021   Pain in right foot 01/10/2021   Acute pancreatitis 11/17/2020   COPD (chronic obstructive pulmonary disease) (HCC)    Essential hypertension    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 [place] [address] Phone: [phone] Fax: [phone]     Social Drivers of Health (SDOH) Social History: SDOH Screenings   Food Insecurity: No Food Insecurity (12/15/2022)  Housing: Low Risk  (12/15/2022)  Transportation Needs: No Transportation Needs (12/15/2022)  Utilities: Not At Risk (12/15/2022)  Tobacco Use: High Risk (06/14/2024)   SDOH Interventions:     Readmission Risk Interventions    12/10/2021    9:47 AM  Readmission Risk Prevention Plan  Post Dischage Appt Complete  Medication Screening Complete  Transportation Screening Complete

## 2024-06-15 NOTE — H&P (Addendum)
 History and Physical    Elizabeth [name] FMW:[id number] DOB: [birth year] DOA: 06/14/2024  PCP: Dwight Trula SQUIBB, MD   Patient coming from: Home  Chief Complaint: Abdominal pain associated with nausea and vomiting.  HPI: Elizabeth [name] is a 83 y.o. female with with PMH significant for recurrent pancreatitis, COPD, CAD, recent lumbar discectomy presented to the ED with complaints of epigastric abdominal pain associated with nausea and vomiting.  He describes pain as sharp, 7 /10 on pain scale,  denies any radiation of the pain to the back.  Patient also reports having associated nausea and vomiting.  Denies any urinary symptoms.  Patient reports pain feels similar to when she has had pancreatitis in the past.  Patient denies any recent travel, sick contacts, cough, diarrhea, constipation, headache.   ED Course: She was hemodynamically stable except hypertension. Temp 98.6, HR 73, RR 18, BP 143/53, SpO2 93% on room air Labs include sodium 139, potassium 3.2, chloride 101, bicarb 26, glucose 110, BUN 11, creatinine 0.87, calcium  9.2, anion gap 12, alkaline phosphatase 69, albumin  3.3, lipase 2800, AST 25, ALT 12, total protein 6.7, total bilirubin 0.6, WBC 12.0, hemoglobin 12.6, hematocrit 38.5, platelet 200, UA shows trace leukocytes otherwise unremarkable, CTA chest / Abd/ Pelvis showed  Advanced atherosclerosis. 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 The left subclavian is reconstituted. Other advanced visceral arterial branch atherosclerosis and stenosis, especially the Right Main Renal Artery. 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 Negative for aortic dissection or aneurysm. Aortic Atherosclerosis (ICD10-I70.0). Emphysema (ICD10-J43.9).  No other acute finding in the Chest. No acute or inflammatory process identified in the abdomen or pelvis. Chronic pancreatic ductal dilatation. No active pancreatic inflammation. Large bowel diverticulosis, no  active inflammation identified.  Review of Systems:Review of Systems  Constitutional:  Positive for malaise/fatigue.  HENT: Negative.    Eyes: Negative.   Respiratory: Negative.    Cardiovascular: Negative.   Gastrointestinal:  Positive for abdominal pain, nausea and vomiting.  Genitourinary: Negative.   Musculoskeletal: Negative.   Skin: Negative.   Neurological: Negative.   Endo/Heme/Allergies: Negative.   Psychiatric/Behavioral: Negative.      Past Medical History:  Diagnosis Date   Abdominal cramping    possibly irritable bowel syndrome versus adhesions secondary to multiple surgeries and abdominal inflammation and 2009 secondary to diverticulitis and diverticular phlegmon   Allergic rhinitis    Arthritis    Carotid artery disease (HCC)    50-69% RICA, < 50% LICA 10/10/20 US  ([facility])   Cataract    Chest pain syndrome 2004   with adenosine Cardiolite  that was normal   Chronic hoarseness    COPD (chronic obstructive pulmonary disease) (HCC)    Dyspnea    GERD (gastroesophageal reflux disease)    Heart murmur    Mild AS, AI. AV pk grad 18.79 mmHg, mn grad 11.20 mmHg, AVA (VTI) 1.05 cm2 10/10/20, 3 year f/u rec ([facility])   History of kidney stones    History of renal calculi    Hypercholesterolemia    Hypertension    Incontinence    Cough incontinence   Mild depression    Neuromuscular disorder (HCC)    neuropathy bilateral lower extremities   Osteopenia    Peripheral vascular disease (HCC)    with femoral and carotid bruits   PONV (postoperative nausea and vomiting)    Recurrent pancreatitis    Tobacco dependence    Vitamin D deficiency  Past Surgical History:  Procedure Laterality Date   ABDOMINAL AORTOGRAM W/LOWER EXTREMITY N/A 09/18/2021   Procedure: ABDOMINAL AORTOGRAM W/LOWER EXTREMITY;  Surgeon: Lanis Fonda BRAVO, MD;  Location: [facility] INVASIVE CV LAB;  Service: Cardiovascular;  Laterality: N/A;   AORTA - BILATERAL FEMORAL ARTERY BYPASS GRAFT Bilateral  12/03/2021   Procedure: AORTOBIFEMORAL BYPASS GRAFT USING A 14 X 7mm HEMASHIELD GOLD GRAFT;  Surgeon: Lanis Fonda BRAVO, MD;  Location: [facility];  Service: Vascular;  Laterality: Bilateral;   APPENDECTOMY     BIOPSY  02/24/2022   Procedure: BIOPSY;  Surgeon: Wilhelmenia Aloha Raddle., MD;  Location: [facility];  Service: Gastroenterology;;   BREAST BIOPSY     CATARACT EXTRACTION, BILATERAL     COLON SURGERY     2009   COSMETIC SURGERY     on neck and eyes   DILATION AND CURETTAGE OF UTERUS     ESOPHAGOGASTRODUODENOSCOPY (EGD) WITH PROPOFOL  N/A 02/24/2022   Procedure: ESOPHAGOGASTRODUODENOSCOPY (EGD) WITH PROPOFOL ;  Surgeon: Wilhelmenia Aloha Raddle., MD;  Location: [facility];  Service: Gastroenterology;  Laterality: N/A;   EUS N/A 02/24/2022   Procedure: UPPER ENDOSCOPIC ULTRASOUND (EUS) RADIAL;  Surgeon: Wilhelmenia Aloha Raddle., MD;  Location: [facility];  Service: Gastroenterology;  Laterality: N/A;   EYE SURGERY     2014   FEMUR SURGERY  12/2007   left femur surgery--for femoral neck fracture status post fall    FRACTURE SURGERY     broke left leg and left arm- 2012   HOT HEMOSTASIS N/A 02/24/2022   Procedure: HOT HEMOSTASIS (ARGON PLASMA COAGULATION/BICAP);  Surgeon: Wilhelmenia Aloha Raddle., MD;  Location: [facility];  Service: Gastroenterology;  Laterality: N/A;   LIPOSUCTION     LUMBAR LAMINECTOMY/DECOMPRESSION MICRODISCECTOMY N/A 11/12/2020   Procedure: LAMINECTOMY AND FORAMINOTOMY Lumbar three four, Lumbar four five, Lumbar Five Sacral one;  Surgeon: Mavis Purchase, MD;  Location: [facility] OR;  Service: Neurosurgery;  Laterality: N/A;   LUMBAR LAMINECTOMY/DECOMPRESSION MICRODISCECTOMY Left 06/01/2024   Procedure: LEFT LUMBAR THREE-FOUR, LUMBAR FOUR-FIVE MICRODISCECTOMY;  Surgeon: Mavis Purchase, MD;  Location: [facility] OR;  Service: Neurosurgery;  Laterality: Left;  , LT, L34, L45   OTHER SURGICAL HISTORY     sigmoid colectomy   SHOULDER SURGERY     arthroscopic shoulder surgery     TONSILLECTOMY     TUBAL LIGATION       reports that she has been smoking cigarettes. She has a 30 pack-year smoking history. She has never been exposed to tobacco smoke. She has never used smokeless tobacco. She reports current alcohol use. She reports that she does not use drugs.  Allergies  Allergen Reactions   Alendronate Sodium Other (See Comments)    loose teeth Fosamax*   Azithromycin  Diarrhea and Nausea And Vomiting   Chantix [Varenicline]     Other reaction(s): N/V/D and flu sxs   Other Other (See Comments)    Chlorexolone - unknown reaction   Pollen Extract     Allergic to tree pollens   Tegretol [Carbamazepine] Swelling   Lisinopril Rash   Tetracyclines & Related Rash    Family History  Problem Relation Age of Onset   Heart disease Mother    Stroke Mother    Colon cancer Neg Hx    Esophageal cancer Neg Hx    Stomach cancer Neg Hx    Inflammatory bowel disease Neg Hx    Liver disease Neg Hx    Pancreatic cancer Neg Hx    Rectal cancer Neg Hx    Family history reviewed  and not pertinent.  Prior to Admission medications   Medication Sig Start Date End Date Taking? Authorizing Provider  amLODipine  (NORVASC ) 10 MG tablet Take 10 mg by mouth daily. 10/16/22   [provider]  aspirin  EC 81 MG tablet Take 81 mg by mouth at bedtime. Swallow whole.    [provider]  cholecalciferol (VITAMIN D3) 25 MCG (1000 UNIT) tablet Take 1,000 Units by mouth daily.    [provider]  cloNIDine  (CATAPRES ) 0.1 MG tablet Take 1 tablet (0.1 mg total) by mouth daily. 05/17/24   Darron Deatrice LABOR, MD  Coenzyme Q10 100 MG capsule Take 100 mg by mouth every morning.    [provider]  cyanocobalamin  (VITAMIN B12) 1000 MCG tablet Take 1,000 mcg by mouth daily.    [provider]  docusate sodium  (COLACE) 100 MG capsule Take 1 capsule (100 mg total) by mouth 2 (two) times daily. 06/02/24   Mavis Purchase, MD  furosemide  (LASIX ) 20 MG tablet  Take 20 mg by mouth daily.    [provider]  HYDROcodone -acetaminophen  (NORCO/VICODIN) 5-325 MG tablet Take 1-2 tablets by mouth every 4 (four) hours as needed for moderate pain (pain score 4-6). 06/02/24   Mavis Purchase, MD  INCRUSE ELLIPTA  62.5 MCG/INH AEPB Inhale 1 puff into the lungs daily. 11/21/20   Rai, Nydia POUR, MD  metoprolol  succinate (TOPROL -XL) 25 MG 24 hr tablet Take 25 mg by mouth at bedtime.    [provider]  Multiple Vitamin (MULTIVITAMIN WITH MINERALS) TABS tablet Take 1 tablet by mouth 2 (two) times daily.    [provider]  rosuvastatin  (CRESTOR ) 40 MG tablet Take 40 mg by mouth every evening.    [provider]  TURMERIC CURCUMIN PO Take 1 capsule by mouth daily. With Ginger    [provider]  VITAMIN E PO Take 1 capsule by mouth daily.    [provider]    Physical Exam: Vitals:   06/15/24 1030 06/15/24 1200 06/15/24 1202 06/15/24 1355  BP: (!) 134/51 (!) 139/56  (!) 143/53  Pulse: 68 72  73  Resp: 15 13  18   Temp:   98.3 F (36.8 C) 98.6 F (37 C)  TempSrc:    Oral  SpO2: 93% 94%  93%  Weight:        Constitutional: NAD, calm, comfortable, not in any acute distress. Vitals:   06/15/24 1030 06/15/24 1200 06/15/24 1202 06/15/24 1355  BP: (!) 134/51 (!) 139/56  (!) 143/53  Pulse: 68 72  73  Resp: 15 13  18   Temp:   98.3 F (36.8 C) 98.6 F (37 C)  TempSrc:    Oral  SpO2: 93% 94%  93%  Weight:       Eyes: PERRL, lids and conjunctivae normal ENMT: Mucous membranes are moist. Posterior pharynx clear of any exudate or lesions. Neck: normal, supple, no masses, no thyromegaly Respiratory: Clear to auscultation bilaterally, no wheezing, no crackles. Normal respiratory effort.  Cardiovascular: S1+S2 , Regular rate and rhythm, no murmurs / rubs / gallops. No extremity edema. 2+ pedal pulses.  Abdomen: Soft, mild tenderness noted in the epigastric region,. No hepatosplenomegaly. Bowel sounds positive.   Musculoskeletal: no clubbing / cyanosis. No joint deformity upper and lower extremities. Good ROM, no contractures. Normal muscle tone.  Skin: no rashes, lesions, ulcers. No induration Neurologic: CN 2-12 grossly intact. Sensation intact, DTR normal. Strength 5/5 in all 4.  Psychiatric: Normal judgment and insight. Alert and oriented x 3.  Normal mood.    Labs on Admission: I have personally reviewed following labs and imaging studies  CBC: Recent Labs  Lab 06/14/24 1942  WBC 12.0*  HGB 12.6  HCT 38.5  MCV 91.2  PLT 200   Basic Metabolic Panel: Recent Labs  Lab 06/14/24 1942  NA 139  K 3.2*  CL 101  CO2 26  GLUCOSE 110*  BUN 11  CREATININE 0.87  CALCIUM  9.2   GFR: Estimated Creatinine Clearance: 37 mL/min (by C-G formula based on SCr of 0.87 mg/dL). Liver Function Tests: Recent Labs  Lab 06/14/24 1942  AST 25  ALT 12  ALKPHOS 69  BILITOT 0.6  PROT 6.7  ALBUMIN  3.3*   Recent Labs  Lab 06/14/24 1942  LIPASE >2,800*   No results for input(s): AMMONIA in the last 168 hours. Coagulation Profile: No results for input(s): INR, PROTIME in the last 168 hours. Cardiac Enzymes: No results for input(s): CKTOTAL, CKMB, CKMBINDEX, TROPONINI in the last 168 hours. BNP (last 3 results) No results for input(s): PROBNP in the last 8760 hours. HbA1C: No results for input(s): HGBA1C in the last 72 hours. CBG: No results for input(s): GLUCAP in the last 168 hours. Lipid Profile: No results for input(s): CHOL, HDL, LDLCALC, TRIG, CHOLHDL, LDLDIRECT in the last 72 hours. Thyroid  Function Tests: No results for input(s): TSH, T4TOTAL, FREET4, T3FREE, THYROIDAB in the last 72 hours. Anemia Panel: No results for input(s): VITAMINB12, FOLATE, FERRITIN, TIBC, IRON, RETICCTPCT in the last 72 hours. Urine analysis:    Component Value Date/Time   COLORURINE YELLOW 06/15/2024 0739   APPEARANCEUR CLEAR 06/15/2024 0739    LABSPEC 1.015 06/15/2024 0739   PHURINE 7.0 06/15/2024 0739   GLUCOSEU NEGATIVE 06/15/2024 0739   HGBUR NEGATIVE 06/15/2024 0739   BILIRUBINUR NEGATIVE 06/15/2024 0739   KETONESUR NEGATIVE 06/15/2024 0739   PROTEINUR NEGATIVE 06/15/2024 0739   NITRITE NEGATIVE 06/15/2024 0739   LEUKOCYTESUR TRACE (A) 06/15/2024 0739    Radiological Exams on Admission: CT L-SPINE NO CHARGE Result Date: 06/15/2024 CLINICAL DATA:  83 year old female with abdominal pain, nausea vomiting. History of pancreatitis. History of aortobifemoral bypass. EXAM: CT LUMBAR SPINE WITH CONTRAST TECHNIQUE: Technique: Multiplanar CT images of the lumbar spine were reconstructed from contemporary CT of the Abdomen and Pelvis. RADIATION DOSE REDUCTION: This exam was performed according to the departmental dose-optimization program which includes automated exposure control, adjustment of the mA and/or kV according to patient size and/or use of iterative reconstruction technique. CONTRAST:  No additional COMPARISON:  CTA Chest, Abdomen, and Pelvis today reported separately. Lumbar MRI 05/10/2024. FINDINGS: Segmentation: Normal with full size ribs at T12, the same numbering system used on the MRI last month. Alignment: Stable lumbar lordosis with grade 1 anterolisthesis of L4 on L5. Vertebrae: Osteopenia. Maintained lumbar vertebral height. Postoperative changes are detailed below. Ununited right L1 transverse process ossification center, stable and normal variant. Lower sacral fracture with marrow edema at S3 and S4 by MRI last month, not apparent by CT. SI joints appear intact. No acute osseous abnormality identified. Paraspinal and other soft tissues: Abdomen and pelvis detailed separately today. Disc levels: T12-L1 through L2-L3 appear negative as on the MRI last month. L3-L4: Vacuum disc. Posterior disc space loss. Disc protrusion asymmetric to the left and into the left lateral recess as demonstrated by MRI last month. Moderate to severe  multifactorial neural foraminal stenosis at this level appears stable. L4-L5: Chronic grade 1 anterolisthesis and previous posterior decompression. This level appears stable with moderate to severe residual  facet hypertrophy, multifactorial moderate to severe bilateral foraminal stenosis. L5-S1: Chronic severe disc space loss. Vacuum disc. Circumferential disc osteophyte complex with a broad-based posterior component. This level appears stable with moderate to severe multifactorial L5 foraminal stenosis. Chronic lower lumbar posterior decompression. Underlying chronic lower lumbar grade 1 spondylolisthesis and advanced lumbar posterior element degeneration. Severe chronic disc degeneration at the lumbosacral junction. IMPRESSION: 1. No acute osseous abnormality in the Lumbar Spine. Lower sacral fracture with marrow edema at S3 and S4 by MRI last month, not apparent by CT. 2. Stable lumbar spine degeneration since the MRI last month. Previous L4 decompression. L3-L4 disc herniation into the left lateral recess. And moderate to severe neural foraminal stenosis at L3-L4 through L5-S1. 3. CTA Chest, Abdomen and Pelvis today reported separately. Electronically Signed   By: VEAR Hurst M.D.   On: 06/15/2024 08:15   CT Angio Chest/Abd/Pel for Dissection W and/or W/WO Result Date: 06/15/2024 CLINICAL DATA:  83 year old female with abdominal pain, nausea vomiting. History of pancreatitis. History of aortobifemoral bypass. EXAM: CT ANGIOGRAPHY CHEST, ABDOMEN AND PELVIS TECHNIQUE: Non-contrast CT of the chest was initially obtained. Multidetector CT imaging through the chest, abdomen and pelvis was performed using the standard protocol during bolus administration of intravenous contrast. Multiplanar reconstructed images and MIPs were obtained and reviewed to evaluate the vascular anatomy. RADIATION DOSE REDUCTION: This exam was performed according to the departmental dose-optimization program which includes automated exposure  control, adjustment of the mA and/or kV according to patient size and/or use of iterative reconstruction technique. CONTRAST:  75mL OMNIPAQUE  IOHEXOL  350 MG/ML SOLN COMPARISON:  CTA Chest, Abdomen, and Pelvis 12/11/2021. Lumbar spine CT today reported separately. CT Abdomen and Pelvis 12/15/2022. FINDINGS: CTA CHEST FINDINGS Cardiovascular: Advanced calcified coronary artery and Calcified aortic atherosclerosis. Heart size remains normal. No pericardial effusion. Following contrast mild cardiac pulsation artifact. Negative for thoracic aortic dissection or aneurysm. 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 left subclavian reconstituted a short distance later. Other proximal great vessels remain patent. The major bilateral pulmonary arteries also enhancing and patent. Mediastinum/Nodes: Stable since 2023, negative for mediastinal mass or lymphadenopathy. Lungs/Pleura: Centrilobular emphysema. Stable lung volumes and major airways remain patent. Mild dependent atelectasis and/or scarring. No pleural effusion, active lung inflammation, or suspicious lung nodule. Musculoskeletal: Stable visualized osseous structures. No acute or suspicious osseous lesion in the chest. Review of the MIP images confirms the above findings. CTA ABDOMEN AND PELVIS FINDINGS VASCULAR Severe chronic Aortoiliac calcified atherosclerosis. Chronic aortobifemoral bypass graft is patent. Negative for abdominal aortic aneurysm or dissection. Celiac, SMA, and renal arteries remain patent despite extensive atherosclerosis. Bypassed major bilateral iliac arteries remain patent. Postoperative changes to the Common femoral arteries, visible bilateral proximal femoral arteries remain patent. No portal venous phase contrast. Review of the MIP images confirms the above findings. NON-VASCULAR Hepatobiliary: Stable and negative liver and gallbladder. Pancreas:  Chronically dilated main pancreatic duct is not significantly changed. Small areas of pancreatic dystrophic calcification re demonstrated on the noncontrast CT portion. Pancreatic enhancement following contrast is within normal limits. No active pancreatic inflammation identified. No peripancreatic fluid collection. Spleen: Negative early arterial phase appearance. Adrenals/Urinary Tract: Negative adrenal glands. Kidneys appears stable, nonobstructed, symmetrically enhancing. There is a degree of chronic asymmetric right renal atrophy, this might be related to chronic right renal artery stenosis, uncertain. Unremarkable urinary bladder. Stomach/Bowel: Similar retained gas in the rectum. Large bowel retained stool and intermittent diverticula. No active large  bowel inflammation identified. No dilated large or small bowel. Decompressed proximal stomach. Mild retained fluid in the distal stomach and proximal duodenum. Decompressed distal duodenum. No pneumoperitoneum. No free fluid identified in the abdomen. Lymphatic: No lymphadenopathy identified. Reproductive: Chronically diminutive or absent. Other: No convincing pelvis free fluid today. Musculoskeletal: Lumbar spine CT reported separately. Chronic proximal left femur ORIF. No acute or suspicious osseous lesion. Review of the MIP images confirms the above findings. IMPRESSION: 1. Advanced atherosclerosis. 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 The left subclavian is reconstituted. Other advanced visceral arterial branch atherosclerosis and stenosis, especially the Right Main Renal Artery. 2. 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 Negative for aortic dissection or aneurysm. Aortic Atherosclerosis (ICD10-I70.0). 3. Emphysema (ICD10-J43.9).  No other acute finding in the Chest. 4. No acute or inflammatory process identified in the abdomen or pelvis. Chronic pancreatic ductal dilatation. No active pancreatic  inflammation. Large bowel diverticulosis, no active inflammation identified. 5. Lumbar spine CT today reported separately. Electronically Signed   By: VEAR Hurst M.D.   On: 06/15/2024 07:53    EKG: Obtain EKG.  Assessment/Plan Principal Problem:   Acute pancreatitis Active Problems:   COPD (chronic obstructive pulmonary disease) (HCC)   Essential hypertension   Gastro-esophageal reflux disease without esophagitis   Mixed hyperlipidemia   PVD (peripheral vascular disease) (HCC)   Aortic valve insufficiency   History of pancreatitis   Acute pancreatitis: Patient presented with epigastric pain associated with nausea, vomiting without radiation. Lipase found to be elevated above 2800.  CT abdomen shows no pancreatic inflammation. Continue bowel rest, IV fluid resuscitation. Adequate pain control with morphine  as needed. Continue antinausea medication.  As needed Monitor and trend lipase. Obtain Lipid profile, RUQ ultrasound.  Essential hypertension: Continue amlodipine  10 mg daily. Continue clonidine  0.1 mg daily  GERD: Continue pantoprazole  40 mg daily  Hyperlipidemia: Continue rosuvastatin  daily.  COPD: Continue home inhalers.  Peripheral vascular disease: Proximal left subclavian artery occlusion: EDP discussed the case with Dr. Pearline of vascular surgery who advised as long as she is not having any arm pain she should be followed up as an outpatient setting.  Advised not to check blood pressure in the left arm.  Continue aspirin  and rosuvastatin .  Hypokalemia: Replaced.  Continue to monitor.   DVT prophylaxis: SCDs Code Status: Full code Family Communication: No family at bed side Disposition Plan:  Home   Consults called: Vascular surgery Admission status:    Darcel Dawley MD [facility]   If 7PM-7AM, please contact night-coverage   06/15/2024, 3:20 PM

## 2024-06-15 NOTE — ED Provider Notes (Signed)
 [place] EMERGENCY DEPARTMENT AT [facility] Provider Note   CSN: [id number] Arrival date & time: 06/14/24  1815     Patient presents with: Abdominal Pain  HPI Elizabeth [name] is a 83 y.o. female with history of recent lumbar discectomy, pancreatitis, COPD, CAD presenting for abdominal pain.  Started 2 days ago but worse last night.  Pain is in the epigastric region but does radiate to the right upper quadrant.  Denies radiation of the pain to her back.  Denies chest pain shortness of breath.  Endorses nausea but no vomiting or diarrhea.  Denies urinary symptoms.  She states the pain feels very similar to when she has had pancreatitis in the past.  She states also that her blood pressure reads low in the left arm due to a weak heart valve.    Abdominal Pain      Prior to Admission medications   Medication Sig Start Date End Date Taking? Authorizing Provider  amLODipine  (NORVASC ) 10 MG tablet Take 10 mg by mouth daily. 10/16/22  Yes [provider]  aspirin  EC 81 MG tablet Take 81 mg by mouth at bedtime. Swallow whole.   Yes [provider]  cholecalciferol (VITAMIN D3) 25 MCG (1000 UNIT) tablet Take 1,000 Units by mouth daily.   Yes [provider]  cloNIDine  (CATAPRES ) 0.1 MG tablet Take 1 tablet (0.1 mg total) by mouth daily. 05/17/24  Yes Darron Deatrice LABOR, MD  Coenzyme Q10 100 MG capsule Take 100 mg by mouth every morning.   Yes [provider]  cyanocobalamin  (VITAMIN B12) 1000 MCG tablet Take 1,000 mcg by mouth daily.   Yes [provider]  docusate sodium  (COLACE) 100 MG capsule Take 1 capsule (100 mg total) by mouth 2 (two) times daily. 06/02/24  Yes Mavis Purchase, MD  furosemide  (LASIX ) 20 MG tablet Take 20 mg by mouth daily.   Yes [provider]  INCRUSE ELLIPTA  62.5 MCG/INH AEPB Inhale 1 puff into the lungs daily. 11/21/20  Yes Rai, Ripudeep K, MD  metoprolol  succinate (TOPROL -XL) 25 MG 24 hr tablet Take 25 mg  by mouth at bedtime.   Yes [provider]  Multiple Vitamin (MULTIVITAMIN WITH MINERALS) TABS tablet Take 1 tablet by mouth 2 (two) times daily.   Yes [provider]  rosuvastatin  (CRESTOR ) 40 MG tablet Take 40 mg by mouth every evening.   Yes [provider]  TURMERIC CURCUMIN PO Take 1 capsule by mouth daily. With Ginger   Yes [provider]  VITAMIN E PO Take 1 capsule by mouth daily.   Yes [provider]  HYDROcodone -acetaminophen  (NORCO/VICODIN) 5-325 MG tablet Take 1-2 tablets by mouth every 4 (four) hours as needed for moderate pain (pain score 4-6). Patient not taking: Reported on 06/15/2024 06/02/24   Mavis Purchase, MD    Allergies: Alendronate sodium, Azithromycin , Chantix [varenicline], Other, Pollen extract, Tegretol [carbamazepine], Lisinopril, and Tetracyclines & related    Review of Systems  Gastrointestinal:  Positive for abdominal pain.    Updated Vital Signs BP (!) 129/49   Pulse 72   Temp 98.7 F (37.1 C) (Oral)   Resp 13   Wt 48.2 kg   SpO2 94%   BMI 20.07 kg/m   Physical Exam Vitals and nursing note reviewed.  HENT:     Head: Normocephalic and atraumatic.     Mouth/Throat:     Mouth: Mucous membranes are moist.  Eyes:     General:  Right eye: No discharge.        Left eye: No discharge.     Conjunctiva/sclera: Conjunctivae normal.  Cardiovascular:     Rate and Rhythm: Normal rate and regular rhythm.     Pulses:          Radial pulses are 2+ on the right side and 1+ on the left side.     Heart sounds: Normal heart sounds.     Comments: Left arm is warm, cap refill is brisk. Pulmonary:     Effort: Pulmonary effort is normal.     Breath sounds: Normal breath sounds.  Abdominal:     General: Abdomen is flat.     Palpations: Abdomen is soft.     Tenderness: There is abdominal tenderness in the right upper quadrant and epigastric area.  Skin:    General: Skin is warm and dry.  Neurological:      General: No focal deficit present.  Psychiatric:        Mood and Affect: Mood normal.     (all labs ordered are listed, but only abnormal results are displayed) Labs Reviewed  LIPASE, BLOOD - Abnormal; Notable for the following components:      Result Value   Lipase >2,800 (*)    All other components within normal limits  COMPREHENSIVE METABOLIC PANEL WITH GFR - Abnormal; Notable for the following components:   Potassium 3.2 (*)    Glucose, Bld 110 (*)    Albumin  3.3 (*)    All other components within normal limits  CBC - Abnormal; Notable for the following components:   WBC 12.0 (*)    All other components within normal limits  URINALYSIS, ROUTINE W REFLEX MICROSCOPIC - Abnormal; Notable for the following components:   Leukocytes,Ua TRACE (*)    All other components within normal limits    EKG: None  Radiology: CT L-SPINE NO CHARGE Result Date: 06/15/2024 CLINICAL DATA:  83 year old female with abdominal pain, nausea vomiting. History of pancreatitis. History of aortobifemoral bypass. EXAM: CT LUMBAR SPINE WITH CONTRAST TECHNIQUE: Technique: Multiplanar CT images of the lumbar spine were reconstructed from contemporary CT of the Abdomen and Pelvis. RADIATION DOSE REDUCTION: This exam was performed according to the departmental dose-optimization program which includes automated exposure control, adjustment of the mA and/or kV according to patient size and/or use of iterative reconstruction technique. CONTRAST:  No additional COMPARISON:  CTA Chest, Abdomen, and Pelvis today reported separately. Lumbar MRI 05/10/2024. FINDINGS: Segmentation: Normal with full size ribs at T12, the same numbering system used on the MRI last month. Alignment: Stable lumbar lordosis with grade 1 anterolisthesis of L4 on L5. Vertebrae: Osteopenia. Maintained lumbar vertebral height. Postoperative changes are detailed below. Ununited right L1 transverse process ossification center, stable and normal variant.  Lower sacral fracture with marrow edema at S3 and S4 by MRI last month, not apparent by CT. SI joints appear intact. No acute osseous abnormality identified. Paraspinal and other soft tissues: Abdomen and pelvis detailed separately today. Disc levels: T12-L1 through L2-L3 appear negative as on the MRI last month. L3-L4: Vacuum disc. Posterior disc space loss. Disc protrusion asymmetric to the left and into the left lateral recess as demonstrated by MRI last month. Moderate to severe multifactorial neural foraminal stenosis at this level appears stable. L4-L5: Chronic grade 1 anterolisthesis and previous posterior decompression. This level appears stable with moderate to severe residual facet hypertrophy, multifactorial moderate to severe bilateral foraminal stenosis. L5-S1: Chronic severe disc space loss. Vacuum disc. Circumferential  disc osteophyte complex with a broad-based posterior component. This level appears stable with moderate to severe multifactorial L5 foraminal stenosis. Chronic lower lumbar posterior decompression. Underlying chronic lower lumbar grade 1 spondylolisthesis and advanced lumbar posterior element degeneration. Severe chronic disc degeneration at the lumbosacral junction. IMPRESSION: 1. No acute osseous abnormality in the Lumbar Spine. Lower sacral fracture with marrow edema at S3 and S4 by MRI last month, not apparent by CT. 2. Stable lumbar spine degeneration since the MRI last month. Previous L4 decompression. L3-L4 disc herniation into the left lateral recess. And moderate to severe neural foraminal stenosis at L3-L4 through L5-S1. 3. CTA Chest, Abdomen and Pelvis today reported separately. Electronically Signed   By: VEAR Hurst M.D.   On: 06/15/2024 08:15   CT Angio Chest/Abd/Pel for Dissection W and/or W/WO Result Date: 06/15/2024 CLINICAL DATA:  83 year old female with abdominal pain, nausea vomiting. History of pancreatitis. History of aortobifemoral bypass. EXAM: CT ANGIOGRAPHY  CHEST, ABDOMEN AND PELVIS TECHNIQUE: Non-contrast CT of the chest was initially obtained. Multidetector CT imaging through the chest, abdomen and pelvis was performed using the standard protocol during bolus administration of intravenous contrast. Multiplanar reconstructed images and MIPs were obtained and reviewed to evaluate the vascular anatomy. RADIATION DOSE REDUCTION: This exam was performed according to the departmental dose-optimization program which includes automated exposure control, adjustment of the mA and/or kV according to patient size and/or use of iterative reconstruction technique. CONTRAST:  75mL OMNIPAQUE  IOHEXOL  350 MG/ML SOLN COMPARISON:  CTA Chest, Abdomen, and Pelvis 12/11/2021. Lumbar spine CT today reported separately. CT Abdomen and Pelvis 12/15/2022. FINDINGS: CTA CHEST FINDINGS Cardiovascular: Advanced calcified coronary artery and Calcified aortic atherosclerosis. Heart size remains normal. No pericardial effusion. Following contrast mild cardiac pulsation artifact. Negative for thoracic aortic dissection or aneurysm. 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 left subclavian reconstituted a short distance later. Other proximal great vessels remain patent. The major bilateral pulmonary arteries also enhancing and patent. Mediastinum/Nodes: Stable since 2023, negative for mediastinal mass or lymphadenopathy. Lungs/Pleura: Centrilobular emphysema. Stable lung volumes and major airways remain patent. Mild dependent atelectasis and/or scarring. No pleural effusion, active lung inflammation, or suspicious lung nodule. Musculoskeletal: Stable visualized osseous structures. No acute or suspicious osseous lesion in the chest. Review of the MIP images confirms the above findings. CTA ABDOMEN AND PELVIS FINDINGS VASCULAR Severe chronic Aortoiliac calcified atherosclerosis. Chronic aortobifemoral bypass  graft is patent. Negative for abdominal aortic aneurysm or dissection. Celiac, SMA, and renal arteries remain patent despite extensive atherosclerosis. Bypassed major bilateral iliac arteries remain patent. Postoperative changes to the Common femoral arteries, visible bilateral proximal femoral arteries remain patent. No portal venous phase contrast. Review of the MIP images confirms the above findings. NON-VASCULAR Hepatobiliary: Stable and negative liver and gallbladder. Pancreas: Chronically dilated main pancreatic duct is not significantly changed. Small areas of pancreatic dystrophic calcification re demonstrated on the noncontrast CT portion. Pancreatic enhancement following contrast is within normal limits. No active pancreatic inflammation identified. No peripancreatic fluid collection. Spleen: Negative early arterial phase appearance. Adrenals/Urinary Tract: Negative adrenal glands. Kidneys appears stable, nonobstructed, symmetrically enhancing. There is a degree of chronic asymmetric right renal atrophy, this might be related to chronic right renal artery stenosis, uncertain. Unremarkable urinary bladder. Stomach/Bowel: Similar retained gas in the rectum. Large bowel retained stool and intermittent diverticula. No active large bowel inflammation identified. No dilated large or small bowel. Decompressed proximal stomach. Mild retained fluid in the distal  stomach and proximal duodenum. Decompressed distal duodenum. No pneumoperitoneum. No free fluid identified in the abdomen. Lymphatic: No lymphadenopathy identified. Reproductive: Chronically diminutive or absent. Other: No convincing pelvis free fluid today. Musculoskeletal: Lumbar spine CT reported separately. Chronic proximal left femur ORIF. No acute or suspicious osseous lesion. Review of the MIP images confirms the above findings. IMPRESSION: 1. Advanced atherosclerosis. 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 The left subclavian is reconstituted. Other advanced visceral arterial branch atherosclerosis and stenosis, especially the Right Main Renal Artery. 2. 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 Negative for aortic dissection or aneurysm. Aortic Atherosclerosis (ICD10-I70.0). 3. Emphysema (ICD10-J43.9).  No other acute finding in the Chest. 4. No acute or inflammatory process identified in the abdomen or pelvis. Chronic pancreatic ductal dilatation. No active pancreatic inflammation. Large bowel diverticulosis, no active inflammation identified. 5. Lumbar spine CT today reported separately. Electronically Signed   By: VEAR Hurst M.D.   On: 06/15/2024 07:53     Procedures   Medications Ordered in the ED  0.9 %  sodium chloride  infusion (has no administration in time range)  acetaminophen  (TYLENOL ) tablet 650 mg (has no administration in time range)    Or  acetaminophen  (TYLENOL ) suppository 650 mg (has no administration in time range)  morphine  (PF) 2 MG/ML injection 2 mg (has no administration in time range)  docusate sodium  (COLACE) capsule 100 mg (has no administration in time range)  ondansetron  (ZOFRAN ) tablet 4 mg (has no administration in time range)    Or  ondansetron  (ZOFRAN ) injection 4 mg (has no administration in time range)  fentaNYL  (SUBLIMAZE ) injection 50 mcg (50 mcg Intramuscular Given 06/14/24 1939)  ondansetron  (ZOFRAN ) injection 4 mg (4 mg Intravenous Given 06/14/24 1937)  ondansetron  (ZOFRAN ) injection 4 mg (4 mg Intravenous Given 06/15/24 0653)  fentaNYL  (SUBLIMAZE ) injection 50 mcg (50 mcg Intravenous Given 06/15/24 0655)  iohexol  (OMNIPAQUE ) 350 MG/ML injection 75 mL (75 mLs Intravenous Contrast Given 06/15/24 9277)                                    Medical Decision Making Amount and/or Complexity of Data Reviewed Radiology: ordered.  Risk Prescription drug management. Decision regarding hospitalization.   Initial Impression and Ddx 83 yo well  appearing female presenting for abdominal pain.  Exam notable for right upper quadrant and and epigastric tenderness.  DDx includes acute pancreatitis, gallbladder pathology, kidney stone, ACS, aortic dissection, postoperative complication, other. 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  Shared findings with patient.  Patient Reassessment and Ultimate Disposition/Management Pain and symptoms improved.  Discussed CT findings with Dr. Pearline of vascular surgery who advised as long as she is not having arm pain that she should follow-up with vascular surgery in the outpatient setting.  Also advised not to take any other blood pressures in the left arm.  Admitted to hospital service with Dr. Darcel.  Patient management required discussion with the following services or consulting groups:  Hospitalist Service Vascular surgery.   Complexity of Problems Addressed Acute complicated illness or Injury  Additional Data Reviewed and Analyzed Further history obtained from: Past medical history and medications listed in the EMR and Prior ED visit notes  Patient Encounter Risk Assessment Consideration of hospitalization      Final diagnoses:  Acute pancreatitis, unspecified complication status, unspecified pancreatitis type    ED Discharge Orders     None          Anaiya Wisinski K, PA-C 06/15/24 1559    Carita Senior, MD 06/15/24 2107

## 2024-06-15 NOTE — ED Notes (Signed)
 Per Night RN, Pt's sat dropped after receiving Fentanyl .  Pt placed on 2L [place].

## 2024-06-15 NOTE — ED Notes (Signed)
 Patient transported to CT

## 2024-06-16 DIAGNOSIS — K858 Other acute pancreatitis without necrosis or infection: Secondary | ICD-10-CM | POA: Diagnosis not present

## 2024-06-16 LAB — CBC
HCT: 35.3 % — ABNORMAL LOW (ref 36.0–46.0)
Hemoglobin: 11.4 g/dL — ABNORMAL LOW (ref 12.0–15.0)
MCH: 29.5 pg (ref 26.0–34.0)
MCHC: 32.3 g/dL (ref 30.0–36.0)
MCV: 91.2 fL (ref 80.0–100.0)
Platelets: 157 K/uL (ref 150–400)
RBC: 3.87 MIL/uL (ref 3.87–5.11)
RDW: 13.9 % (ref 11.5–15.5)
WBC: 9.1 K/uL (ref 4.0–10.5)
nRBC: 0 % (ref 0.0–0.2)

## 2024-06-16 LAB — COMPREHENSIVE METABOLIC PANEL WITH GFR
ALT: 9 U/L (ref 0–44)
AST: 20 U/L (ref 15–41)
Albumin: 2.7 g/dL — ABNORMAL LOW (ref 3.5–5.0)
Alkaline Phosphatase: 56 U/L (ref 38–126)
Anion gap: 9 (ref 5–15)
BUN: 15 mg/dL (ref 8–23)
CO2: 21 mmol/L — ABNORMAL LOW (ref 22–32)
Calcium: 8.3 mg/dL — ABNORMAL LOW (ref 8.9–10.3)
Chloride: 106 mmol/L (ref 98–111)
Creatinine, Ser: 0.95 mg/dL (ref 0.44–1.00)
GFR, Estimated: 59 mL/min — ABNORMAL LOW (ref >60)
Glucose, Bld: 61 mg/dL — ABNORMAL LOW (ref 70–99)
Potassium: 3.5 mmol/L (ref 3.5–5.1)
Sodium: 136 mmol/L (ref 135–145)
Total Bilirubin: 1.1 mg/dL (ref 0.0–1.2)
Total Protein: 5.7 g/dL — ABNORMAL LOW (ref 6.5–8.1)

## 2024-06-16 LAB — LIPID PANEL
Cholesterol: 117 mg/dL (ref 0–200)
HDL: 40 mg/dL — ABNORMAL LOW (ref >40)
LDL Cholesterol: 57 mg/dL (ref 0–99)
Total CHOL/HDL Ratio: 2.9 ratio
Triglycerides: 100 mg/dL (ref <150)
VLDL: 20 mg/dL (ref 0–40)

## 2024-06-16 LAB — PHOSPHORUS: Phosphorus: 3.5 mg/dL (ref 2.5–4.6)

## 2024-06-16 LAB — MAGNESIUM: Magnesium: 1.7 mg/dL (ref 1.7–2.4)

## 2024-06-16 LAB — LIPASE, BLOOD: Lipase: 60 U/L — ABNORMAL HIGH (ref 11–51)

## 2024-06-16 NOTE — Progress Notes (Signed)
 PROGRESS NOTE    Elizabeth [name]  FMW:[id number] DOB: 08/21/1941 DOA: 06/14/2024 PCP: Dwight Trula SQUIBB, MD   Brief Narrative:  This 83 yrs old female  with PMH significant for recurrent pancreatitis, COPD, CAD, recent lumbar discectomy presented to the ED with complaints of epigastric abdominal pain associated with nausea and vomiting.  She describes pain as sharp, 7 /10 on pain scale,  denies any radiation of the pain to the back.  Patient also reports having associated nausea and vomiting.  Denies any urinary symptoms. Patient reports pain feels similar to when she has had pancreatitis in the past.  Patient was admitted for acute pancreatitis , further evaluation.  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   Assessment & Plan:   Principal Problem:   Acute pancreatitis Active Problems:   COPD (chronic obstructive pulmonary disease) (HCC)   Essential hypertension   Gastro-esophageal reflux disease without esophagitis   Mixed hyperlipidemia   PVD (peripheral vascular disease) (HCC)   Aortic valve insufficiency   History of pancreatitis  Acute pancreatitis: Patient presented with epigastric pain associated with nausea, vomiting without radiation. Lipase found to be elevated above 2800.  CT abdomen shows no pancreatic inflammation. Continue bowel rest, IV fluid resuscitation. Adequate pain control with morphine  as needed. Continue antinausea medication.  As needed Lipase significantly trended down to 62. She reports pain has improved. She is started on clear liquid diet. Right upper quadrant ultrasound shows dilated CBD and pancreatic duct. MRCP ordered, consider GI consult if abnormal.   Essential hypertension: Continue amlodipine  10 mg daily. Continue clonidine  0.1 mg daily   GERD: Continue pantoprazole  40 mg daily   Hyperlipidemia: Continue rosuvastatin  daily.    COPD: Continue home inhalers.   Peripheral vascular disease: Proximal left subclavian artery occlusion: EDP discussed the case with Dr. Pearline of vascular surgery who advised as long as she is not having any arm pain she should be followed up as an outpatient setting.  Advised not to check blood pressure in the left arm.  Continue aspirin  and rosuvastatin .   Hypokalemia: Replaced.  Continue to monitor.    DVT prophylaxis: SCDS Code Status: Full code Family Communication:No family at bed side Disposition Plan:    Status is: Inpatient Remains inpatient appropriate because: Admitted for acute pancreatitis.     Consultants:  Vascular surgery  Procedures:CT A/P  Antimicrobials:  Anti-infectives (From admission, onward)    None      Subjective: Patient was seen and examined at bedside.Overnight events noted. Patient reports why she is having recurrent flares of pancreatitis.   She wants to have more workup and GI consultation.  Objective: Vitals:   06/15/24 2036 06/16/24 0438 06/16/24 0730 06/16/24 0823  BP: (!) 122/46 (!) 131/49 (!) 78/47 (!) 117/46  Pulse: 74 78 72 73  Resp: 16 13 18 18   Temp: 98.2 F (36.8 C) 98.2 F (36.8 C) 98 F (36.7 C) 98 F (36.7 C)  TempSrc: Oral Oral Oral Oral  SpO2: 91% 100% 92% (!) 89%  Weight:      Height:        Intake/Output Summary (Last 24 hours) at 06/16/2024 1426 Last data filed at 06/15/2024 1841 Gross per 24 hour  Intake 46.01 ml  Output --  Net 46.01 ml   Filed Weights   06/14/24 1925 06/15/24 1851  Weight: 48.2 kg 48.2 kg    Examination:  General exam: Appears calm and  comfortable, not in any acute distress. Respiratory system: Clear to auscultation. Respiratory effort normal.  RR 14 Cardiovascular system: S1 & S2 heard, RRR. No JVD, murmurs, rubs, gallops or clicks.  Gastrointestinal system: Abdomen is nondistended, soft and nontender. Normal bowel sounds heard. Central nervous system: Alert and oriented x  3. No focal neurological deficits. Extremities: No edema, no cyanosis, no clubbing. Skin: No rashes, lesions or ulcers Psychiatry: Judgement and insight appear normal. Mood & affect appropriate.     Data Reviewed: I have personally reviewed following labs and imaging studies  CBC: Recent Labs  Lab 06/14/24 1942 06/16/24 0355  WBC 12.0* 9.1  HGB 12.6 11.4*  HCT 38.5 35.3*  MCV 91.2 91.2  PLT 200 157   Basic Metabolic Panel: Recent Labs  Lab 06/14/24 1942 06/16/24 0355  NA 139 136  K 3.2* 3.5  CL 101 106  CO2 26 21*  GLUCOSE 110* 61*  BUN 11 15  CREATININE 0.87 0.95  CALCIUM  9.2 8.3*  MG  --  1.7  PHOS  --  3.5   GFR: Estimated Creatinine Clearance: 33.9 mL/min (by C-G formula based on SCr of 0.95 mg/dL). Liver Function Tests: Recent Labs  Lab 06/14/24 1942 06/16/24 0355  AST 25 20  ALT 12 9  ALKPHOS 69 56  BILITOT 0.6 1.1  PROT 6.7 5.7*  ALBUMIN  3.3* 2.7*   Recent Labs  Lab 06/14/24 1942 06/16/24 0355  LIPASE >2,800* 60*   No results for input(s): AMMONIA in the last 168 hours. Coagulation Profile: No results for input(s): INR, PROTIME in the last 168 hours. Cardiac Enzymes: No results for input(s): CKTOTAL, CKMB, CKMBINDEX, TROPONINI in the last 168 hours. BNP (last 3 results) No results for input(s): PROBNP in the last 8760 hours. HbA1C: No results for input(s): HGBA1C in the last 72 hours. CBG: No results for input(s): GLUCAP in the last 168 hours. Lipid Profile: Recent Labs    06/16/24 0355  CHOL 117  HDL 40*  LDLCALC 57  TRIG 899  CHOLHDL 2.9   Thyroid  Function Tests: No results for input(s): TSH, T4TOTAL, FREET4, T3FREE, THYROIDAB in the last 72 hours. Anemia Panel: No results for input(s): VITAMINB12, FOLATE, FERRITIN, TIBC, IRON, RETICCTPCT in the last 72 hours. Sepsis Labs: No results for input(s): PROCALCITON, LATICACIDVEN in the last 168 hours.  No results found for this or any  previous visit (from the past 240 hours).       Radiology Studies: US  Abdomen Limited RUQ (LIVER/GB) Result Date: 06/15/2024 CLINICAL DATA:  Acute pancreatitis EXAM: ULTRASOUND ABDOMEN LIMITED RIGHT UPPER QUADRANT COMPARISON:  None Available. FINDINGS: Gallbladder: No gallstones or wall thickening visualized. No sonographic Murphy sign noted by sonographer. Common bile duct: Diameter: 9.2 mm Liver: No focal lesion identified. Liver echotexture is coarse. Liver echogenicity is within normal limits. Portal vein is patent on color Doppler imaging with normal direction of blood flow towards the liver. Pancreas: Pancreatic duct is dilated measuring 5 mm. Right kidney: Echogenicity is increased. No hydronephrosis. Right renal cyst present measuring 8 mm Other: None. IMPRESSION: 1. Dilated common bile duct and pancreatic duct. Recommend further evaluation with MRCP. 2. Coarse echotexture of the liver. 3. Increased echogenicity of the right kidney suggesting medical renal disease. Electronically Signed   By: Greig Pique M.D.   On: 06/15/2024 21:36   CT L-SPINE NO CHARGE Result Date: 06/15/2024 CLINICAL DATA:  83 year old female with abdominal pain, nausea vomiting. History of pancreatitis. History of aortobifemoral bypass. EXAM: CT LUMBAR SPINE WITH CONTRAST TECHNIQUE:  Technique: Multiplanar CT images of the lumbar spine were reconstructed from contemporary CT of the Abdomen and Pelvis. RADIATION DOSE REDUCTION: This exam was performed according to the departmental dose-optimization program which includes automated exposure control, adjustment of the mA and/or kV according to patient size and/or use of iterative reconstruction technique. CONTRAST:  No additional COMPARISON:  CTA Chest, Abdomen, and Pelvis today reported separately. Lumbar MRI 05/10/2024. FINDINGS: Segmentation: Normal with full size ribs at T12, the same numbering system used on the MRI last month. Alignment: Stable lumbar lordosis with grade 1  anterolisthesis of L4 on L5. Vertebrae: Osteopenia. Maintained lumbar vertebral height. Postoperative changes are detailed below. Ununited right L1 transverse process ossification center, stable and normal variant. Lower sacral fracture with marrow edema at S3 and S4 by MRI last month, not apparent by CT. SI joints appear intact. No acute osseous abnormality identified. Paraspinal and other soft tissues: Abdomen and pelvis detailed separately today. Disc levels: T12-L1 through L2-L3 appear negative as on the MRI last month. L3-L4: Vacuum disc. Posterior disc space loss. Disc protrusion asymmetric to the left and into the left lateral recess as demonstrated by MRI last month. Moderate to severe multifactorial neural foraminal stenosis at this level appears stable. L4-L5: Chronic grade 1 anterolisthesis and previous posterior decompression. This level appears stable with moderate to severe residual facet hypertrophy, multifactorial moderate to severe bilateral foraminal stenosis. L5-S1: Chronic severe disc space loss. Vacuum disc. Circumferential disc osteophyte complex with a broad-based posterior component. This level appears stable with moderate to severe multifactorial L5 foraminal stenosis. Chronic lower lumbar posterior decompression. Underlying chronic lower lumbar grade 1 spondylolisthesis and advanced lumbar posterior element degeneration. Severe chronic disc degeneration at the lumbosacral junction. IMPRESSION: 1. No acute osseous abnormality in the Lumbar Spine. Lower sacral fracture with marrow edema at S3 and S4 by MRI last month, not apparent by CT. 2. Stable lumbar spine degeneration since the MRI last month. Previous L4 decompression. L3-L4 disc herniation into the left lateral recess. And moderate to severe neural foraminal stenosis at L3-L4 through L5-S1. 3. CTA Chest, Abdomen and Pelvis today reported separately. Electronically Signed   By: VEAR Hurst M.D.   On: 06/15/2024 08:15   CT Angio  Chest/Abd/Pel for Dissection W and/or W/WO Result Date: 06/15/2024 CLINICAL DATA:  83 year old female with abdominal pain, nausea vomiting. History of pancreatitis. History of aortobifemoral bypass. EXAM: CT ANGIOGRAPHY CHEST, ABDOMEN AND PELVIS TECHNIQUE: Non-contrast CT of the chest was initially obtained. Multidetector CT imaging through the chest, abdomen and pelvis was performed using the standard protocol during bolus administration of intravenous contrast. Multiplanar reconstructed images and MIPs were obtained and reviewed to evaluate the vascular anatomy. RADIATION DOSE REDUCTION: This exam was performed according to the departmental dose-optimization program which includes automated exposure control, adjustment of the mA and/or kV according to patient size and/or use of iterative reconstruction technique. CONTRAST:  75mL OMNIPAQUE  IOHEXOL  350 MG/ML SOLN COMPARISON:  CTA Chest, Abdomen, and Pelvis 12/11/2021. Lumbar spine CT today reported separately. CT Abdomen and Pelvis 12/15/2022. FINDINGS: CTA CHEST FINDINGS Cardiovascular: Advanced calcified coronary artery and Calcified aortic atherosclerosis. Heart size remains normal. No pericardial effusion. Following contrast mild cardiac pulsation artifact. Negative for thoracic aortic dissection or aneurysm. 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 left subclavian reconstituted a short distance later. Other proximal great vessels remain patent. The major bilateral pulmonary arteries also enhancing and patent. Mediastinum/Nodes: Stable since 2023, negative for  mediastinal mass or lymphadenopathy. Lungs/Pleura: Centrilobular emphysema. Stable lung volumes and major airways remain patent. Mild dependent atelectasis and/or scarring. No pleural effusion, active lung inflammation, or suspicious lung nodule. Musculoskeletal: Stable visualized osseous structures. No acute  or suspicious osseous lesion in the chest. Review of the MIP images confirms the above findings. CTA ABDOMEN AND PELVIS FINDINGS VASCULAR Severe chronic Aortoiliac calcified atherosclerosis. Chronic aortobifemoral bypass graft is patent. Negative for abdominal aortic aneurysm or dissection. Celiac, SMA, and renal arteries remain patent despite extensive atherosclerosis. Bypassed major bilateral iliac arteries remain patent. Postoperative changes to the Common femoral arteries, visible bilateral proximal femoral arteries remain patent. No portal venous phase contrast. Review of the MIP images confirms the above findings. NON-VASCULAR Hepatobiliary: Stable and negative liver and gallbladder. Pancreas: Chronically dilated main pancreatic duct is not significantly changed. Small areas of pancreatic dystrophic calcification re demonstrated on the noncontrast CT portion. Pancreatic enhancement following contrast is within normal limits. No active pancreatic inflammation identified. No peripancreatic fluid collection. Spleen: Negative early arterial phase appearance. Adrenals/Urinary Tract: Negative adrenal glands. Kidneys appears stable, nonobstructed, symmetrically enhancing. There is a degree of chronic asymmetric right renal atrophy, this might be related to chronic right renal artery stenosis, uncertain. Unremarkable urinary bladder. Stomach/Bowel: Similar retained gas in the rectum. Large bowel retained stool and intermittent diverticula. No active large bowel inflammation identified. No dilated large or small bowel. Decompressed proximal stomach. Mild retained fluid in the distal stomach and proximal duodenum. Decompressed distal duodenum. No pneumoperitoneum. No free fluid identified in the abdomen. Lymphatic: No lymphadenopathy identified. Reproductive: Chronically diminutive or absent. Other: No convincing pelvis free fluid today. Musculoskeletal: Lumbar spine CT reported separately. Chronic proximal left femur  ORIF. No acute or suspicious osseous lesion. Review of the MIP images confirms the above findings. IMPRESSION: 1. Advanced atherosclerosis. 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 The left subclavian is reconstituted. Other advanced visceral arterial branch atherosclerosis and stenosis, especially the Right Main Renal Artery. 2. 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 Negative for aortic dissection or aneurysm. Aortic Atherosclerosis (ICD10-I70.0). 3. Emphysema (ICD10-J43.9).  No other acute finding in the Chest. 4. No acute or inflammatory process identified in the abdomen or pelvis. Chronic pancreatic ductal dilatation. No active pancreatic inflammation. Large bowel diverticulosis, no active inflammation identified. 5. Lumbar spine CT today reported separately. Electronically Signed   By: VEAR Hurst M.D.   On: 06/15/2024 07:53   Scheduled Meds:  amLODipine   10 mg Oral Daily   aspirin  EC  81 mg Oral QHS   cloNIDine   0.1 mg Oral Daily   docusate sodium   100 mg Oral BID   metoprolol  succinate  25 mg Oral QHS   rosuvastatin   40 mg Oral QPM   umeclidinium bromide   1 puff Inhalation Daily   Continuous Infusions:   LOS: 1 day    Time spent: 50 mins    Darcel Dawley, MD [facility]   If 7PM-7AM, please contact night-coverage

## 2024-06-17 ENCOUNTER — Inpatient Hospital Stay (HOSPITAL_COMMUNITY)

## 2024-06-17 DIAGNOSIS — K7689 Other specified diseases of liver: Secondary | ICD-10-CM | POA: Diagnosis not present

## 2024-06-17 DIAGNOSIS — K858 Other acute pancreatitis without necrosis or infection: Secondary | ICD-10-CM | POA: Diagnosis not present

## 2024-06-17 DIAGNOSIS — K838 Other specified diseases of biliary tract: Secondary | ICD-10-CM | POA: Diagnosis not present

## 2024-06-17 DIAGNOSIS — K8689 Other specified diseases of pancreas: Secondary | ICD-10-CM | POA: Diagnosis not present

## 2024-06-17 DIAGNOSIS — K851 Biliary acute pancreatitis without necrosis or infection: Secondary | ICD-10-CM

## 2024-06-17 DIAGNOSIS — K828 Other specified diseases of gallbladder: Secondary | ICD-10-CM | POA: Diagnosis not present

## 2024-06-17 MED ORDER — GADOBUTROL 1 MMOL/ML IV SOLN
5.0000 mL | Freq: Once | INTRAVENOUS | Status: AC | PRN
  Administered 2024-06-17: 5 mL via INTRAVENOUS

## 2024-06-17 NOTE — Progress Notes (Signed)
 Pt is in MRI.  Bari HERO Elizabeth [name]

## 2024-06-17 NOTE — Discharge Instructions (Signed)
 Advised to follow-up with primary care physician in 1 week. Advised to follow-up with gastroenterology Dr. Fritz as scheduled. Advised for liquid diet and advance as tolerated

## 2024-06-17 NOTE — Consult Note (Addendum)
 Referring Provider: No ref. provider found Primary Care Physician:  Dwight Trula SQUIBB, MD Primary Gastroenterologist:  Dr. Stacia  Reason for Consultation:  Abnormal MRI, pancreatitis  HPI: AVERIE HORNBAKER is a 83 y.o. female with PMH significant for recurrent pancreatitis, COPD, CAD, recent lumbar discectomy presented to the ED with complaints of epigastric abdominal pain associated with nausea and vomiting.  Lipase was > 2800, but then down to 60.  LFTs are normal.  She feels good.  Wants to go home.  So far tolerating clear liquids.  Passing flatus, small amount of stool.  MRI abdomen/MRCP:  IMPRESSION: 1. 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 Bilateral retroperitoneal soft tissue stranding and edema which may be a manifestation of acute pancreatitis.No pseudocyst formation. 2. Moderate gallbladder distention without wall thickening, sludge, or gallstones. Fusiform dilatation of the common bile duct (up to 7 mm) and mild intrahepatic bile duct dilatation, unchanged from prior exam. No stones identified. 3. Multiple tiny liver cysts, up to 6 mm. No suspicious enhancing liver lesion identified. 4. Postoperative changes at L4-5 level consistent with recent microdiscectomy, including diffuse epidural enhancement and seroma formation (1.9 x 1.0 cm) without mass effect.  EUS 02/2022:   EGD Impression: - No gross lesions in esophagus. Z- line irregular, 38 cm from the incisors. - 3 cm hiatal hernia. - A single bleeding angioectasia in the stomach. Treated with argon plasma coagulation ( APC) . - Erythematous mucosa in the stomach. Biopsied. - No gross lesions in the duodenal bulb, in the first portion of the duodenum and in the second portion of the duodenum. - Small minor papilla. - Flat major papilla.  EUS Impression: - Pancreatic divisum is noted. The pancreatic duct had a dilated endosonographic appearance in the pancreatic  head traversing into the genu of the pancreas. No overt stricturing or mass/ lesion was noted. The body of the pancreas and tail of the pancreas ducts were dilated somewhat. - The pancreatic duct had prominent side- branches on endosonographic appearance in the body of the pancreas and tail of the pancreas. - Pancreatic parenchymal abnormalities consisting of lobularity and hyperechoic strands were noted in the entire pancreas. - There was no sign of significant pathology in the common bile duct and in the common hepatic duct. - A retroperitoneal fluid collection was present. Based on previous imaging from 2/ 23, this looks to be smaller on today' s EUS examination. It is heterogenous. It is not clear if this is a true post- operative fluid collection as had been alluded to or a peripancreatic fluid collection with necrosis ( though the rest of the pancreas looking as it does makes that seem less likely) . This could be sampled if necessary, but not clear that it needs to - No malignant- appearing lymph nodes were visualized in the celiac region ( level 20) , peripancreatic region and porta hepatis region.   Past Medical History:  Diagnosis Date   Abdominal cramping    possibly irritable bowel syndrome versus adhesions secondary to multiple surgeries and abdominal inflammation and 2009 secondary to diverticulitis and diverticular phlegmon   Allergic rhinitis    Arthritis    Carotid artery disease (HCC)    50-69% RICA, < 50% LICA 10/10/20 US  ([facility])   Cataract    Chest pain syndrome 2004   with adenosine Cardiolite  that was normal   Chronic hoarseness    COPD (chronic obstructive pulmonary disease) (HCC)    Dyspnea  GERD (gastroesophageal reflux disease)    Heart murmur    Mild AS, AI. AV pk grad 18.79 mmHg, mn grad 11.20 mmHg, AVA (VTI) 1.05 cm2 10/10/20, 3 year f/u rec ([facility])   History of kidney stones    History of renal calculi    Hypercholesterolemia    Hypertension    Incontinence     Cough incontinence   Mild depression    Neuromuscular disorder (HCC)    neuropathy bilateral lower extremities   Osteopenia    Peripheral vascular disease (HCC)    with femoral and carotid bruits   PONV (postoperative nausea and vomiting)    Recurrent pancreatitis    Tobacco dependence    Vitamin D deficiency     Past Surgical History:  Procedure Laterality Date   ABDOMINAL AORTOGRAM W/LOWER EXTREMITY N/A 09/18/2021   Procedure: ABDOMINAL AORTOGRAM W/LOWER EXTREMITY;  Surgeon: Lanis Fonda BRAVO, MD;  Location: [facility] INVASIVE CV LAB;  Service: Cardiovascular;  Laterality: N/A;   AORTA - BILATERAL FEMORAL ARTERY BYPASS GRAFT Bilateral 12/03/2021   Procedure: AORTOBIFEMORAL BYPASS GRAFT USING A 14 X 7mm HEMASHIELD GOLD GRAFT;  Surgeon: Lanis Fonda BRAVO, MD;  Location: [facility] OR;  Service: Vascular;  Laterality: Bilateral;   APPENDECTOMY     BIOPSY  02/24/2022   Procedure: BIOPSY;  Surgeon: Wilhelmenia Aloha Raddle., MD;  Location: [facility];  Service: Gastroenterology;;   BREAST BIOPSY     CATARACT EXTRACTION, BILATERAL     COLON SURGERY     2009   COSMETIC SURGERY     on neck and eyes   DILATION AND CURETTAGE OF UTERUS     ESOPHAGOGASTRODUODENOSCOPY (EGD) WITH PROPOFOL  N/A 02/24/2022   Procedure: ESOPHAGOGASTRODUODENOSCOPY (EGD) WITH PROPOFOL ;  Surgeon: Wilhelmenia Aloha Raddle., MD;  Location: [facility];  Service: Gastroenterology;  Laterality: N/A;   EUS N/A 02/24/2022   Procedure: UPPER ENDOSCOPIC ULTRASOUND (EUS) RADIAL;  Surgeon: Wilhelmenia Aloha Raddle., MD;  Location: [facility];  Service: Gastroenterology;  Laterality: N/A;   EYE SURGERY     2014   FEMUR SURGERY  12/2007   left femur surgery--for femoral neck fracture status post fall    FRACTURE SURGERY     broke left leg and left arm- 2012   HOT HEMOSTASIS N/A 02/24/2022   Procedure: HOT HEMOSTASIS (ARGON PLASMA COAGULATION/BICAP);  Surgeon: Wilhelmenia Aloha Raddle., MD;  Location: [facility];  Service: Gastroenterology;   Laterality: N/A;   LIPOSUCTION     LUMBAR LAMINECTOMY/DECOMPRESSION MICRODISCECTOMY N/A 11/12/2020   Procedure: LAMINECTOMY AND FORAMINOTOMY Lumbar three four, Lumbar four five, Lumbar Five Sacral one;  Surgeon: Mavis Purchase, MD;  Location: [facility] OR;  Service: Neurosurgery;  Laterality: N/A;   LUMBAR LAMINECTOMY/DECOMPRESSION MICRODISCECTOMY Left 06/01/2024   Procedure: LEFT LUMBAR THREE-FOUR, LUMBAR FOUR-FIVE MICRODISCECTOMY;  Surgeon: Mavis Purchase, MD;  Location: [facility] OR;  Service: Neurosurgery;  Laterality: Left;  , LT, L34, L45   OTHER SURGICAL HISTORY     sigmoid colectomy   SHOULDER SURGERY     arthroscopic shoulder surgery    TONSILLECTOMY     TUBAL LIGATION      Prior to Admission medications   Medication Sig Start Date End Date Taking? Authorizing Provider  amLODipine  (NORVASC ) 10 MG tablet Take 10 mg by mouth daily. 10/16/22  Yes [provider]  aspirin  EC 81 MG tablet Take 81 mg by mouth at bedtime. Swallow whole.   Yes [provider]  cholecalciferol (VITAMIN D3) 25 MCG (1000 UNIT) tablet Take 1,000 Units by mouth daily.   Yes  [provider]  cloNIDine  (CATAPRES ) 0.1 MG tablet Take 1 tablet (0.1 mg total) by mouth daily. 05/17/24  Yes Darron Deatrice LABOR, MD  Coenzyme Q10 100 MG capsule Take 100 mg by mouth every morning.   Yes [provider]  cyanocobalamin  (VITAMIN B12) 1000 MCG tablet Take 1,000 mcg by mouth daily.   Yes [provider]  docusate sodium  (COLACE) 100 MG capsule Take 1 capsule (100 mg total) by mouth 2 (two) times daily. 06/02/24  Yes Mavis Purchase, MD  furosemide  (LASIX ) 20 MG tablet Take 20 mg by mouth daily.   Yes [provider]  INCRUSE ELLIPTA  62.5 MCG/INH AEPB Inhale 1 puff into the lungs daily. 11/21/20  Yes Rai, Ripudeep K, MD  metoprolol  succinate (TOPROL -XL) 25 MG 24 hr tablet Take 25 mg by mouth at bedtime.   Yes [provider]  Multiple Vitamin (MULTIVITAMIN WITH MINERALS) TABS  tablet Take 1 tablet by mouth 2 (two) times daily.   Yes [provider]  rosuvastatin  (CRESTOR ) 40 MG tablet Take 40 mg by mouth every evening.   Yes [provider]  TURMERIC CURCUMIN PO Take 1 capsule by mouth daily. With Ginger   Yes [provider]  VITAMIN E PO Take 1 capsule by mouth daily.   Yes [provider]  HYDROcodone -acetaminophen  (NORCO/VICODIN) 5-325 MG tablet Take 1-2 tablets by mouth every 4 (four) hours as needed for moderate pain (pain score 4-6). Patient not taking: Reported on 06/15/2024 06/02/24   Mavis Purchase, MD    Current Facility-Administered Medications  Medication Dose Route Frequency Provider Last Rate Last Admin   acetaminophen  (TYLENOL ) tablet 650 mg  650 mg Oral Q6H PRN Leotis Bogus, MD       Or   acetaminophen  (TYLENOL ) suppository 650 mg  650 mg Rectal Q6H PRN Leotis Bogus, MD       amLODipine  (NORVASC ) tablet 10 mg  10 mg Oral Daily Leotis Bogus, MD   10 mg at 06/17/24 0931   aspirin  EC tablet 81 mg  81 mg Oral QHS Leotis Bogus, MD   81 mg at 06/17/24 0209   cloNIDine  (CATAPRES ) tablet 0.1 mg  0.1 mg Oral Daily Leotis Bogus, MD   0.1 mg at 06/17/24 9068   docusate sodium  (COLACE) capsule 100 mg  100 mg Oral BID Leotis Bogus, MD   100 mg at 06/17/24 9068   metoprolol  succinate (TOPROL -XL) 24 hr tablet 25 mg  25 mg Oral QHS Leotis Bogus, MD   25 mg at 06/17/24 0208   morphine  (PF) 2 MG/ML injection 2 mg  2 mg Intravenous Q2H PRN Leotis Bogus, MD   2 mg at 06/15/24 2306   ondansetron  (ZOFRAN ) tablet 4 mg  4 mg Oral Q6H PRN Leotis Bogus, MD       Or   ondansetron  (ZOFRAN ) injection 4 mg  4 mg Intravenous Q6H PRN Leotis Bogus, MD       rosuvastatin  (CRESTOR ) tablet 40 mg  40 mg Oral QPM Leotis Bogus, MD   40 mg at 06/16/24 1715   umeclidinium bromide  (INCRUSE ELLIPTA ) 62.5 MCG/ACT 1 puff  1 puff Inhalation Daily Leotis Bogus, MD   1 puff at 06/17/24 0932    Allergies as of 06/14/2024 -  Review Complete 06/14/2024  Allergen Reaction Noted   Alendronate sodium Other (See Comments) 12/30/2021   Azithromycin  Diarrhea and Nausea And Vomiting 12/30/2021   Chantix [varenicline]  12/30/2021   Other Other (See Comments) 11/21/2013   Pollen extract  04/27/2014  Tegretol [carbamazepine] Swelling 11/21/2013   Lisinopril Rash 11/21/2013   Tetracyclines & related Rash 11/21/2013    Family History  Problem Relation Age of Onset   Heart disease Mother    Stroke Mother    Colon cancer Neg Hx    Esophageal cancer Neg Hx    Stomach cancer Neg Hx    Inflammatory bowel disease Neg Hx    Liver disease Neg Hx    Pancreatic cancer Neg Hx    Rectal cancer Neg Hx     Social History   Socioeconomic History   Marital status: Widowed    Spouse name: Not on file   Number of children: Not on file   Years of education: Not on file   Highest education level: Not on file  Occupational History   Not on file  Tobacco Use   Smoking status: Every Day    Current packs/day: 0.50    Average packs/day: 0.5 packs/day for 60.0 years (30.0 ttl pk-yrs)    Types: Cigarettes    Passive exposure: Never   Smokeless tobacco: Never  Vaping Use   Vaping status: Never Used  Substance and Sexual Activity   Alcohol use: Yes    Alcohol/week: 0.0 standard drinks of alcohol    Comment: Occ glass of wine   Drug use: No   Sexual activity: Not on file  Other Topics Concern   Not on file  Social History Narrative   Not on file   Social Drivers of Health   Financial Resource Strain: Not on file  Food Insecurity: No Food Insecurity (06/15/2024)   Hunger Vital Sign    Worried About Running Out of Food in the Last Year: Never true    Ran Out of Food in the Last Year: Never true  Transportation Needs: No Transportation Needs (06/15/2024)   PRAPARE - Administrator, Civil Service (Medical): No    Lack of Transportation (Non-Medical): No  Physical Activity: Not on file  Stress: Not on file   Social Connections: Moderately Integrated (06/15/2024)   Social Connection and Isolation Panel    Frequency of Communication with Friends and Family: More than three times a week    Frequency of Social Gatherings with Friends and Family: More than three times a week    Attends Religious Services: More than 4 times per year    Active Member of [employer] or Organizations: Yes    Attends Banker Meetings: More than 4 times per year    Marital Status: Widowed  Intimate Partner Violence: Not At Risk (06/15/2024)   Humiliation, Afraid, Rape, and Kick questionnaire    Fear of Current or Ex-Partner: No    Emotionally Abused: No    Physically Abused: No    Sexually Abused: No    Review of Systems: ROS is O/W negative except as mentioned in HPI.  Physical Exam: Vital signs in last 24 hours: Temp:  [97.7 F (36.5 C)-98.3 F (36.8 C)] 97.9 F (36.6 C) (08/15 0854) Pulse Rate:  [67-78] 67 (08/15 0854) Resp:  [16-18] 18 (08/15 0854) BP: (130-144)/(49-55) 130/49 (08/15 0854) SpO2:  [90 %-96 %] 96 % (08/15 0854)   General:  Alert, Well-developed, well-nourished, pleasant and cooperative in NAD Head:  Normocephalic and atraumatic. Eyes:  Sclera clear, no icterus.  Conjunctiva pink. Ears:  Normal auditory acuity. Mouth:  No deformity or lesions.   Lungs:  Clear throughout to auscultation.  No wheezes, crackles, or rhonchi.  Heart:  Regular rate and  rhythm; no murmurs, clicks, rubs, or gallops. Abdomen:  Soft, non-distended.  BS present.  Diffuse TTP> in the upper abdomen. Msk:  Symmetrical without gross deformities. Pulses:  Normal pulses noted. Extremities:  Without clubbing or edema. Neurologic:  Alert and oriented x 4;  grossly normal neurologically. Skin:  Intact without significant lesions or rashes. Psych:  Alert and cooperative. Normal mood and affect.  Intake/Output from previous day: 08/14 0701 - 08/15 0700 In: 300 [P.O.:300] Out: 0   Lab Results: Recent Labs     06/14/24 1942 06/16/24 0355  WBC 12.0* 9.1  HGB 12.6 11.4*  HCT 38.5 35.3*  PLT 200 157   BMET Recent Labs    06/14/24 1942 06/16/24 0355  NA 139 136  K 3.2* 3.5  CL 101 106  CO2 26 21*  GLUCOSE 110* 61*  BUN 11 15  CREATININE 0.87 0.95  CALCIUM  9.2 8.3*   LFT Recent Labs    06/16/24 0355  PROT 5.7*  ALBUMIN  2.7*  AST 20  ALT 9  ALKPHOS 56  BILITOT 1.1   Studies/Results: MR ABDOMEN MRCP W WO CONTAST Result Date: 06/17/2024 EXAM: MRCP WITH AND WITHOUT IV CONTRAST 06/17/2024 12:58:00 AM TECHNIQUE: Multisequence, multiplanar magnetic resonance images of the abdomen with and without intravenous contrast. MRCP sequences were performed. COMPARISON: CT from 06/15/2024. CLINICAL HISTORY: Pancreatitis. FINDINGS: LIVER: Multiple tiny T2 hyperintense foci are scattered throughout both lobes of liver compatible with multiple liver cysts. These measure up to 6 mm. No suspicious enhancing liver lesion identified. GALLBLADDER AND BILIARY SYSTEM: Moderate distention of the gallbladder. No gallbladder wall thickening, sludge or gallstones. No pericholecystic inflammation. Fusiform dilatation of the common bile duct measures up to 7 mm. There is mild intrahepatic bile duct dilatation. These findings are unchanged when compared with the exam from 04/11/2020. No stones identified along the course of the common bile duct. SPLEEN: Spleen appears normal. PANCREAS/PANCREATIC DUCT: Pancreas and divisum anatomy. The main pancreatic duct is mildly increased in caliber measuring 3.8 mm, image 29/9. No pancreatic mass identified. Equivocal parenchymal edema without significant peripancreatic soft tissue stranding. No focal fluid collections. ADRENAL GLANDS: Adrenal glands normal. KIDNEYS: Multiple bilateral simple-appearing kidney cysts noted. The largest arises of the lateral cortex of the right kidney measuring 1.1 cm, image 17/5. LYMPH NODES: No adenopathy. VASULATURE: Postoperative changes from previous  aortofemoral bypass grafting. A graft-visualized portions of the graft are patent. PERITONEUM: No ascites. ABDOMINAL WALL: No hernia. No mass. BOWEL: Stomach appears nondistended. No pathologic dilatation of the bowel loops. BONES: Postoperative changes are identified at the L4-5 level consistent with recent microdiscectomy. Here, there is diffuse epidural enhancement at this level with peripheral enhancing fluid tract extending from the left posterior and lateral aspect of the L4 level compatible with postoperative change and seroma formation. The seroma is at the 5 o'clock position posterior to the canal measuring 1.9 x 1.0 cm. There is no signs of mass effect upon the thecal sac. SOFT TISSUES: There is bilateral soft tissue stranding/edema within the retroperitoneum with bilateral perinephric soft tissue stranding. IMPRESSION: 1. 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 Bilateral retroperitoneal soft tissue stranding and edema which may be a manifestation of acute pancreatitis.No pseudocyst formation. 2. Moderate gallbladder distention without wall thickening, sludge, or gallstones. Fusiform dilatation of the common bile duct (up to 7 mm) and mild intrahepatic bile duct dilatation, unchanged from prior exam. No stones identified. 3. Multiple tiny liver cysts, up to 6 mm. No suspicious  enhancing liver lesion identified. 4. Postoperative changes at L4-5 level consistent with recent microdiscectomy, including diffuse epidural enhancement and seroma formation (1.9 x 1.0 cm) without mass effect. Electronically signed by: Waddell Calk MD 06/17/2024 06:00 AM EDT RP Workstation: [id number]   US  Abdomen Limited RUQ (LIVER/GB) Result Date: 06/15/2024 CLINICAL DATA:  Acute pancreatitis EXAM: ULTRASOUND ABDOMEN LIMITED RIGHT UPPER QUADRANT COMPARISON:  None Available. FINDINGS: Gallbladder: No gallstones or wall thickening visualized. No sonographic Murphy sign  noted by sonographer. Common bile duct: Diameter: 9.2 mm Liver: No focal lesion identified. Liver echotexture is coarse. Liver echogenicity is within normal limits. Portal vein is patent on color Doppler imaging with normal direction of blood flow towards the liver. Pancreas: Pancreatic duct is dilated measuring 5 mm. Right kidney: Echogenicity is increased. No hydronephrosis. Right renal cyst present measuring 8 mm Other: None. IMPRESSION: 1. Dilated common bile duct and pancreatic duct. Recommend further evaluation with MRCP. 2. Coarse echotexture of the liver. 3. Increased echogenicity of the right kidney suggesting medical renal disease. Electronically Signed   By: Greig Pique M.D.   On: 06/15/2024 21:36   IMPRESSION:  *Acute pancreatitis:  Lipase >2800 but now is 60.  Has known pancreatic divisum.  MRCP shows no CBD stones, stable findings and LFTs are normal.  PLAN: -Treat with supportive care with IVFs, pain control, etc for pancreatitis. -No ERCP needed at this time. -Can follow-up with Dr. Wilhelmenia as an outpatient if she desires as he had previously seen her in 2023 and discussed pancreatic ERCP with attempt at minor sphincterotomy but she had declined at that time. -Will place her on full liquids and see how she does.  She would like to go home soon, today if possible.   Harlene BIRCH. Zehr  06/17/2024, 11:20 AM    Attending physician's note   I have taken history, reviewed the chart and examined the patient. I performed a substantive portion of this encounter, including complete performance of at least one of the key components, in conjunction with the APP. I agree with the Advanced Practitioner's note, impression and recommendations.   Acute recurrent pancreatitis- d/t pancreas divisum. MRCP neg otherwise.  Nl LFTs.  She is doing much better.  Tolerating full liquid diet.  No abdo pain. [place] to go home  Plan: - Full liquid diet today.  Low-fat diet in AM.  Then advance to  previous diet. - FU with Dr Wilhelmenia for consideration of minor papula sphincterotomy/PD stent as outpt. Note she declined previously. - OK to D/C home.  MRCP reviewed.   Anselm Bring, MD [facility] [phone]

## 2024-06-17 NOTE — TOC Transition Note (Signed)
 Transition of Care [facility]) - Discharge Note   Patient Details  Name: Elizabeth [name] MRN: [id number] Date of Birth: [birth year]  Transition of Care [facility]) CM/SW Contact:  Tom-Johnson, Harvest Muskrat, RN Phone Number: 06/17/2024, 2:17 PM   Clinical Narrative:     Patient is scheduled for discharge today.  Readmission Risk Assessment done. Outpatient f/u, hospital f/u and discharge instructions on AVS. No TOC needs or recommendations noted. Family to transport at discharge.  No further TOC needs noted.       Final next level of care: Home/Self Care Barriers to Discharge: Barriers Resolved   Patient Goals and CMS Choice Patient states their goals for this hospitalization and ongoing recovery are:: To return home CMS Medicare.gov Compare Post Acute Care list provided to:: Patient Choice offered to / list presented to : NA      Discharge Placement                Patient to be transferred to facility by: Family      Discharge Plan and Services Additional resources added to the After Visit Summary for   In-house Referral: Clinical Social Work              DME Arranged: N/A DME Agency: NA       HH Arranged: NA HH Agency: NA        Social Drivers of Health (SDOH) Interventions SDOH Screenings   Food Insecurity: No Food Insecurity (06/15/2024)  Housing: Low Risk  (06/15/2024)  Transportation Needs: No Transportation Needs (06/15/2024)  Utilities: Not At Risk (06/15/2024)  Social Connections: Moderately Integrated (06/15/2024)  Tobacco Use: High Risk (06/14/2024)     Readmission Risk Interventions    06/17/2024    2:16 PM 12/10/2021    9:47 AM  Readmission Risk Prevention Plan  Post Dischage Appt  Complete  Medication Screening  Complete  Transportation Screening Complete Complete  PCP or Specialist Appt within 5-7 Days Complete   Home Care Screening Complete   Medication Review (RN CM) Referral to Pharmacy

## 2024-06-17 NOTE — Discharge Summary (Signed)
 Physician Discharge Summary  Elizabeth [name] FMW:[id number] DOB: [birth year] DOA: 06/14/2024  PCP: Dwight Trula SQUIBB, MD  Admit date: 06/14/2024  Discharge date: 06/17/2024  Admitted From: Home.  Disposition:  Home.  Recommendations for Outpatient Follow-up:  Follow up with PCP in 1-2 weeks. Please obtain BMP/CBC in one week. Advised to follow-up with gastroenterology Dr. Fritz as scheduled.. Advised for liquid diet and advance as tolerated.  Home Health:None Equipment/Devices:None  Discharge Condition: Stable CODE STATUS:Full code Diet recommendation: Full liquid diet.  Brief Summary/ Hospital Course: This 83 yrs old female  with PMH significant for recurrent pancreatitis, COPD, CAD, recent lumbar discectomy presented to the ED with complaints of epigastric abdominal pain associated with nausea and vomiting.  She describes pain as sharp, 7 /10 on pain scale,  denies any radiation of the pain to the back.  Patient also reports having associated nausea and vomiting.  Denies any urinary symptoms. Patient reports pain feels similar to when she has had pancreatitis in the past.  Patient was admitted for acute pancreatitis , further evaluation.  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  Patient was kept on bowel rest, IV hydration and adequate pain control.  Her lipase level completely normalized.  She reports abdominal pain has improved.  RUQ ultrasound shows dilated common bile duct and pancreatic duct.  GI was consulted recommended outpatient ERCP.  Patient was started on clear liquid diet,  tolerated well , advance to full liquid diet.  Patient wants to be discharged.  GI signed off .Patient is being discharged home.   Discharge Diagnoses:  Principal Problem:   Acute pancreatitis Active Problems:   COPD (chronic obstructive pulmonary disease) (HCC)   Essential hypertension    Gastro-esophageal reflux disease without esophagitis   Mixed hyperlipidemia   PVD (peripheral vascular disease) (HCC)   Aortic valve insufficiency   History of pancreatitis  Acute pancreatitis: Patient presented with epigastric pain associated with nausea, vomiting without radiation. Lipase found to be elevated above 2800.  CT abdomen shows no pancreatic inflammation. Continue bowel rest, IV fluid resuscitation. Adequate pain control with morphine  as needed. Continue antinausea medication.  As needed Lipase significantly trended down to 62. She reports pain has improved. She is started on clear liquid diet. Right upper quadrant ultrasound shows dilated CBD and pancreatic duct. MRCP ordered, GI consulted recommended outpatient ERCP. Patient denies any abdominal pain nausea vomiting.  Patient wants to be discharged home.   Essential hypertension: Continue amlodipine  10 mg daily. Continue clonidine  0.1 mg daily   GERD: Continue pantoprazole  40 mg daily   Hyperlipidemia: Continue rosuvastatin  daily.   COPD: Continue home inhalers.   Peripheral vascular disease: Proximal left subclavian artery occlusion: EDP discussed the case with Dr. Pearline of vascular surgery who advised as long as she is not having any arm pain she should be followed up as an outpatient setting.  Advised not to check blood pressure in the left arm.  Continue aspirin  and rosuvastatin .   Hypokalemia: Replaced.  Continue to monitor.  Discharge Instructions  Discharge Instructions     Call MD for:  difficulty breathing, headache or visual disturbances   Complete by: As directed    Call MD for:  persistant dizziness or light-headedness   Complete by: As directed    Call MD for:  persistant nausea and vomiting   Complete by: As directed    Diet - low sodium heart healthy  Complete by: As directed    Diet general   Complete by: As directed    Discharge instructions   Complete by: As directed    Advised  to follow-up with primary care physician in 1 week. Advised to follow-up with gastroenterology Dr. Fritz as scheduled. Advised for liquid diet and advance as tolerated   Increase activity slowly   Complete by: As directed    No wound care   Complete by: As directed       Allergies as of 06/17/2024       Reactions   Alendronate Sodium Other (See Comments)   loose teeth Fosamax*   Azithromycin  Diarrhea, Nausea And Vomiting   Chantix [varenicline]    Other reaction(s): N/V/D and flu sxs   Other Other (See Comments)   Chlorexolone - unknown reaction   Pollen Extract    Allergic to tree pollens   Tegretol [carbamazepine] Swelling   Lisinopril Rash   Tetracyclines & Related Rash        Medication List     STOP taking these medications    HYDROcodone -acetaminophen  5-325 MG tablet Commonly known as: NORCO/VICODIN       TAKE these medications    amLODipine  10 MG tablet Commonly known as: NORVASC  Take 10 mg by mouth daily.   aspirin  EC 81 MG tablet Take 81 mg by mouth at bedtime. Swallow whole.   cholecalciferol 25 MCG (1000 UNIT) tablet Commonly known as: VITAMIN D3 Take 1,000 Units by mouth daily.   cloNIDine  0.1 MG tablet Commonly known as: CATAPRES  Take 1 tablet (0.1 mg total) by mouth daily.   Coenzyme Q10 100 MG capsule Take 100 mg by mouth every morning.   cyanocobalamin  1000 MCG tablet Commonly known as: VITAMIN B12 Take 1,000 mcg by mouth daily.   docusate sodium  100 MG capsule Commonly known as: COLACE Take 1 capsule (100 mg total) by mouth 2 (two) times daily.   furosemide  20 MG tablet Commonly known as: LASIX  Take 20 mg by mouth daily.   Incruse Ellipta  62.5 MCG/INH Aepb Generic drug: umeclidinium bromide  Inhale 1 puff into the lungs daily.   metoprolol  succinate 25 MG 24 hr tablet Commonly known as: TOPROL -XL Take 25 mg by mouth at bedtime.   multivitamin with minerals Tabs tablet Take 1 tablet by mouth 2 (two) times daily.    rosuvastatin  40 MG tablet Commonly known as: CRESTOR  Take 40 mg by mouth every evening.   TURMERIC CURCUMIN PO Take 1 capsule by mouth daily. With Ginger   VITAMIN E PO Take 1 capsule by mouth daily.        Follow-up Information     Mansouraty, Aloha Raddle., MD Follow up on 08/17/2024.   Specialties: Gastroenterology, Internal Medicine Why: 11:30 pm Contact information: [address] [place] [address] [phone]         Dwight Trula SQUIBB, MD Follow up in 1 week(s).   Specialty: Internal Medicine Contact information: [address] [place] [address] [phone]                Allergies  Allergen Reactions   Alendronate Sodium Other (See Comments)    loose teeth Fosamax*   Azithromycin  Diarrhea and Nausea And Vomiting   Chantix [Varenicline]     Other reaction(s): N/V/D and flu sxs   Other Other (See Comments)    Chlorexolone - unknown reaction   Pollen Extract     Allergic to tree pollens   Tegretol [Carbamazepine] Swelling  Lisinopril Rash   Tetracyclines & Related Rash    Consultations: Gastroenterology   Procedures/Studies: MR ABDOMEN MRCP W WO CONTAST Result Date: 06/17/2024 EXAM: MRCP WITH AND WITHOUT IV CONTRAST 06/17/2024 12:58:00 AM TECHNIQUE: Multisequence, multiplanar magnetic resonance images of the abdomen with and without intravenous contrast. MRCP sequences were performed. COMPARISON: CT from 06/15/2024. CLINICAL HISTORY: Pancreatitis. FINDINGS: LIVER: Multiple tiny T2 hyperintense foci are scattered throughout both lobes of liver compatible with multiple liver cysts. These measure up to 6 mm. No suspicious enhancing liver lesion identified. GALLBLADDER AND BILIARY SYSTEM: Moderate distention of the gallbladder. No gallbladder wall thickening, sludge or gallstones. No pericholecystic inflammation. Fusiform dilatation of the common bile duct measures up to 7 mm. There is mild intrahepatic bile duct dilatation. These  findings are unchanged when compared with the exam from 04/11/2020. No stones identified along the course of the common bile duct. SPLEEN: Spleen appears normal. PANCREAS/PANCREATIC DUCT: Pancreas and divisum anatomy. The main pancreatic duct is mildly increased in caliber measuring 3.8 mm, image 29/9. No pancreatic mass identified. Equivocal parenchymal edema without significant peripancreatic soft tissue stranding. No focal fluid collections. ADRENAL GLANDS: Adrenal glands normal. KIDNEYS: Multiple bilateral simple-appearing kidney cysts noted. The largest arises of the lateral cortex of the right kidney measuring 1.1 cm, image 17/5. LYMPH NODES: No adenopathy. VASULATURE: Postoperative changes from previous aortofemoral bypass grafting. A graft-visualized portions of the graft are patent. PERITONEUM: No ascites. ABDOMINAL WALL: No hernia. No mass. BOWEL: Stomach appears nondistended. No pathologic dilatation of the bowel loops. BONES: Postoperative changes are identified at the L4-5 level consistent with recent microdiscectomy. Here, there is diffuse epidural enhancement at this level with peripheral enhancing fluid tract extending from the left posterior and lateral aspect of the L4 level compatible with postoperative change and seroma formation. The seroma is at the 5 o'clock position posterior to the canal measuring 1.9 x 1.0 cm. There is no signs of mass effect upon the thecal sac. SOFT TISSUES: There is bilateral soft tissue stranding/edema within the retroperitoneum with bilateral perinephric soft tissue stranding. IMPRESSION: 1. 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 Bilateral retroperitoneal soft tissue stranding and edema which may be a manifestation of acute pancreatitis.No pseudocyst formation. 2. Moderate gallbladder distention without wall thickening, sludge, or gallstones. Fusiform dilatation of the common bile duct (up to 7 mm) and mild  intrahepatic bile duct dilatation, unchanged from prior exam. No stones identified. 3. Multiple tiny liver cysts, up to 6 mm. No suspicious enhancing liver lesion identified. 4. Postoperative changes at L4-5 level consistent with recent microdiscectomy, including diffuse epidural enhancement and seroma formation (1.9 x 1.0 cm) without mass effect. Electronically signed by: Waddell Calk MD 06/17/2024 06:00 AM EDT RP Workstation: [id number]   US  Abdomen Limited RUQ (LIVER/GB) Result Date: 06/15/2024 CLINICAL DATA:  Acute pancreatitis EXAM: ULTRASOUND ABDOMEN LIMITED RIGHT UPPER QUADRANT COMPARISON:  None Available. FINDINGS: Gallbladder: No gallstones or wall thickening visualized. No sonographic Murphy sign noted by sonographer. Common bile duct: Diameter: 9.2 mm Liver: No focal lesion identified. Liver echotexture is coarse. Liver echogenicity is within normal limits. Portal vein is patent on color Doppler imaging with normal direction of blood flow towards the liver. Pancreas: Pancreatic duct is dilated measuring 5 mm. Right kidney: Echogenicity is increased. No hydronephrosis. Right renal cyst present measuring 8 mm Other: None. IMPRESSION: 1. Dilated common bile duct and pancreatic duct. Recommend further evaluation with MRCP. 2. Coarse echotexture of the liver. 3. Increased echogenicity of the  right kidney suggesting medical renal disease. Electronically Signed   By: Greig Pique M.D.   On: 06/15/2024 21:36   CT L-SPINE NO CHARGE Result Date: 06/15/2024 CLINICAL DATA:  83 year old female with abdominal pain, nausea vomiting. History of pancreatitis. History of aortobifemoral bypass. EXAM: CT LUMBAR SPINE WITH CONTRAST TECHNIQUE: Technique: Multiplanar CT images of the lumbar spine were reconstructed from contemporary CT of the Abdomen and Pelvis. RADIATION DOSE REDUCTION: This exam was performed according to the departmental dose-optimization program which includes automated exposure control, adjustment  of the mA and/or kV according to patient size and/or use of iterative reconstruction technique. CONTRAST:  No additional COMPARISON:  CTA Chest, Abdomen, and Pelvis today reported separately. Lumbar MRI 05/10/2024. FINDINGS: Segmentation: Normal with full size ribs at T12, the same numbering system used on the MRI last month. Alignment: Stable lumbar lordosis with grade 1 anterolisthesis of L4 on L5. Vertebrae: Osteopenia. Maintained lumbar vertebral height. Postoperative changes are detailed below. Ununited right L1 transverse process ossification center, stable and normal variant. Lower sacral fracture with marrow edema at S3 and S4 by MRI last month, not apparent by CT. SI joints appear intact. No acute osseous abnormality identified. Paraspinal and other soft tissues: Abdomen and pelvis detailed separately today. Disc levels: T12-L1 through L2-L3 appear negative as on the MRI last month. L3-L4: Vacuum disc. Posterior disc space loss. Disc protrusion asymmetric to the left and into the left lateral recess as demonstrated by MRI last month. Moderate to severe multifactorial neural foraminal stenosis at this level appears stable. L4-L5: Chronic grade 1 anterolisthesis and previous posterior decompression. This level appears stable with moderate to severe residual facet hypertrophy, multifactorial moderate to severe bilateral foraminal stenosis. L5-S1: Chronic severe disc space loss. Vacuum disc. Circumferential disc osteophyte complex with a broad-based posterior component. This level appears stable with moderate to severe multifactorial L5 foraminal stenosis. Chronic lower lumbar posterior decompression. Underlying chronic lower lumbar grade 1 spondylolisthesis and advanced lumbar posterior element degeneration. Severe chronic disc degeneration at the lumbosacral junction. IMPRESSION: 1. No acute osseous abnormality in the Lumbar Spine. Lower sacral fracture with marrow edema at S3 and S4 by MRI last month, not  apparent by CT. 2. Stable lumbar spine degeneration since the MRI last month. Previous L4 decompression. L3-L4 disc herniation into the left lateral recess. And moderate to severe neural foraminal stenosis at L3-L4 through L5-S1. 3. CTA Chest, Abdomen and Pelvis today reported separately. Electronically Signed   By: VEAR Hurst M.D.   On: 06/15/2024 08:15   CT Angio Chest/Abd/Pel for Dissection W and/or W/WO Result Date: 06/15/2024 CLINICAL DATA:  83 year old female with abdominal pain, nausea vomiting. History of pancreatitis. History of aortobifemoral bypass. EXAM: CT ANGIOGRAPHY CHEST, ABDOMEN AND PELVIS TECHNIQUE: Non-contrast CT of the chest was initially obtained. Multidetector CT imaging through the chest, abdomen and pelvis was performed using the standard protocol during bolus administration of intravenous contrast. Multiplanar reconstructed images and MIPs were obtained and reviewed to evaluate the vascular anatomy. RADIATION DOSE REDUCTION: This exam was performed according to the departmental dose-optimization program which includes automated exposure control, adjustment of the mA and/or kV according to patient size and/or use of iterative reconstruction technique. CONTRAST:  75mL OMNIPAQUE  IOHEXOL  350 MG/ML SOLN COMPARISON:  CTA Chest, Abdomen, and Pelvis 12/11/2021. Lumbar spine CT today reported separately. CT Abdomen and Pelvis 12/15/2022. FINDINGS: CTA CHEST FINDINGS Cardiovascular: Advanced calcified coronary artery and Calcified aortic atherosclerosis. Heart size remains normal. No pericardial effusion. Following contrast mild cardiac pulsation artifact. Negative for  thoracic aortic dissection or aneurysm. 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 left subclavian reconstituted a short distance later. Other proximal great vessels remain patent. The major bilateral pulmonary arteries also enhancing and  patent. Mediastinum/Nodes: Stable since 2023, negative for mediastinal mass or lymphadenopathy. Lungs/Pleura: Centrilobular emphysema. Stable lung volumes and major airways remain patent. Mild dependent atelectasis and/or scarring. No pleural effusion, active lung inflammation, or suspicious lung nodule. Musculoskeletal: Stable visualized osseous structures. No acute or suspicious osseous lesion in the chest. Review of the MIP images confirms the above findings. CTA ABDOMEN AND PELVIS FINDINGS VASCULAR Severe chronic Aortoiliac calcified atherosclerosis. Chronic aortobifemoral bypass graft is patent. Negative for abdominal aortic aneurysm or dissection. Celiac, SMA, and renal arteries remain patent despite extensive atherosclerosis. Bypassed major bilateral iliac arteries remain patent. Postoperative changes to the Common femoral arteries, visible bilateral proximal femoral arteries remain patent. No portal venous phase contrast. Review of the MIP images confirms the above findings. NON-VASCULAR Hepatobiliary: Stable and negative liver and gallbladder. Pancreas: Chronically dilated main pancreatic duct is not significantly changed. Small areas of pancreatic dystrophic calcification re demonstrated on the noncontrast CT portion. Pancreatic enhancement following contrast is within normal limits. No active pancreatic inflammation identified. No peripancreatic fluid collection. Spleen: Negative early arterial phase appearance. Adrenals/Urinary Tract: Negative adrenal glands. Kidneys appears stable, nonobstructed, symmetrically enhancing. There is a degree of chronic asymmetric right renal atrophy, this might be related to chronic right renal artery stenosis, uncertain. Unremarkable urinary bladder. Stomach/Bowel: Similar retained gas in the rectum. Large bowel retained stool and intermittent diverticula. No active large bowel inflammation identified. No dilated large or small bowel. Decompressed proximal stomach. Mild  retained fluid in the distal stomach and proximal duodenum. Decompressed distal duodenum. No pneumoperitoneum. No free fluid identified in the abdomen. Lymphatic: No lymphadenopathy identified. Reproductive: Chronically diminutive or absent. Other: No convincing pelvis free fluid today. Musculoskeletal: Lumbar spine CT reported separately. Chronic proximal left femur ORIF. No acute or suspicious osseous lesion. Review of the MIP images confirms the above findings. IMPRESSION: 1. Advanced atherosclerosis. 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 The left subclavian is reconstituted. Other advanced visceral arterial branch atherosclerosis and stenosis, especially the Right Main Renal Artery. 2. 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 Negative for aortic dissection or aneurysm. Aortic Atherosclerosis (ICD10-I70.0). 3. Emphysema (ICD10-J43.9).  No other acute finding in the Chest. 4. No acute or inflammatory process identified in the abdomen or pelvis. Chronic pancreatic ductal dilatation. No active pancreatic inflammation. Large bowel diverticulosis, no active inflammation identified. 5. Lumbar spine CT today reported separately. Electronically Signed   By: VEAR Hurst M.D.   On: 06/15/2024 07:53   DG Lumbar Spine 1 View Result Date: 06/01/2024 CLINICAL DATA:  Intraoperative localization. EXAM: LUMBAR SPINE - 1 VIEW COMPARISON:  Radiograph dated 04/26/2024. FINDINGS: Single lateral view of lumbar spine provided. The surgical probe is at the level of the L4 vertebra. IMPRESSION: Surgical probe at the level of the L4 vertebra. Electronically Signed   By: Vanetta Chou M.D.   On: 06/01/2024 10:38     Subjective: Patient was seen and examined at bedside.  Overnight events noted. Patient reports feeling much improved and wants to be discharged, nausea,  vomiting and abdominal pain has resolved.  Discharge Exam: Vitals:   06/17/24 0539 06/17/24 0854  BP:  (!) 144/52 (!) 130/49  Pulse: 68 67  Resp: 16  18  Temp: 97.7 F (36.5 C) 97.9 F (36.6 C)  SpO2: 90% 96%   Vitals:   06/16/24 2110 06/17/24 0209 06/17/24 0539 06/17/24 0854  BP: (!) 142/49 (!) 136/55 (!) 144/52 (!) 130/49  Pulse: 71 78 68 67  Resp: 18  16 18   Temp: 98.3 F (36.8 C)  97.7 F (36.5 C) 97.9 F (36.6 C)  TempSrc:   Oral   SpO2: 92% 94% 90% 96%  Weight:      Height:        General: Pt is alert, awake, not in acute distress Cardiovascular: RRR, S1/S2 +, no rubs, no gallops Respiratory: CTA bilaterally, no wheezing, no rhonchi Abdominal: Soft, NT, ND, bowel sounds + Extremities: no edema, no cyanosis    The results of significant diagnostics from this hospitalization (including imaging, microbiology, ancillary and laboratory) are listed below for reference.     Microbiology: No results found for this or any previous visit (from the past 240 hours).   Labs: BNP (last 3 results) No results for input(s): BNP in the last 8760 hours. Basic Metabolic Panel: Recent Labs  Lab 06/14/24 1942 06/16/24 0355  NA 139 136  K 3.2* 3.5  CL 101 106  CO2 26 21*  GLUCOSE 110* 61*  BUN 11 15  CREATININE 0.87 0.95  CALCIUM  9.2 8.3*  MG  --  1.7  PHOS  --  3.5   Liver Function Tests: Recent Labs  Lab 06/14/24 1942 06/16/24 0355  AST 25 20  ALT 12 9  ALKPHOS 69 56  BILITOT 0.6 1.1  PROT 6.7 5.7*  ALBUMIN  3.3* 2.7*   Recent Labs  Lab 06/14/24 1942 06/16/24 0355  LIPASE >2,800* 60*   No results for input(s): AMMONIA in the last 168 hours. CBC: Recent Labs  Lab 06/14/24 1942 06/16/24 0355  WBC 12.0* 9.1  HGB 12.6 11.4*  HCT 38.5 35.3*  MCV 91.2 91.2  PLT 200 157   Cardiac Enzymes: No results for input(s): CKTOTAL, CKMB, CKMBINDEX, TROPONINI in the last 168 hours. BNP: Invalid input(s): POCBNP CBG: No results for input(s): GLUCAP in the last 168 hours. D-Dimer No results for input(s): DDIMER in the last 72 hours. Hgb  A1c No results for input(s): HGBA1C in the last 72 hours. Lipid Profile Recent Labs    06/16/24 0355  CHOL 117  HDL 40*  LDLCALC 57  TRIG 899  CHOLHDL 2.9   Thyroid  function studies No results for input(s): TSH, T4TOTAL, T3FREE, THYROIDAB in the last 72 hours.  Invalid input(s): FREET3 Anemia work up No results for input(s): VITAMINB12, FOLATE, FERRITIN, TIBC, IRON, RETICCTPCT in the last 72 hours. Urinalysis    Component Value Date/Time   COLORURINE YELLOW 06/15/2024 0739   APPEARANCEUR CLEAR 06/15/2024 0739   LABSPEC 1.015 06/15/2024 0739   PHURINE 7.0 06/15/2024 0739   GLUCOSEU NEGATIVE 06/15/2024 0739   HGBUR NEGATIVE 06/15/2024 0739   BILIRUBINUR NEGATIVE 06/15/2024 0739   KETONESUR NEGATIVE 06/15/2024 0739   PROTEINUR NEGATIVE 06/15/2024 0739   NITRITE NEGATIVE 06/15/2024 0739   LEUKOCYTESUR TRACE (A) 06/15/2024 0739   Sepsis Labs Recent Labs  Lab 06/14/24 1942 06/16/24 0355  WBC 12.0* 9.1   Microbiology No results found for this or any previous visit (from the past 240 hours).   Time coordinating discharge: Over 30 minutes  SIGNED:   Darcel Dawley, MD  [facility] 06/17/2024, 2:19 PM Pager   If 7PM-7AM, please contact night-coverage

## 2024-06-19 ENCOUNTER — Other Ambulatory Visit (HOSPITAL_COMMUNITY): Payer: Self-pay | Admitting: Family Medicine

## 2024-06-19 DIAGNOSIS — K839 Disease of biliary tract, unspecified: Secondary | ICD-10-CM

## 2024-06-22 DIAGNOSIS — Z9889 Other specified postprocedural states: Secondary | ICD-10-CM | POA: Diagnosis not present

## 2024-06-22 DIAGNOSIS — Z8719 Personal history of other diseases of the digestive system: Secondary | ICD-10-CM | POA: Diagnosis not present

## 2024-06-22 DIAGNOSIS — I771 Stricture of artery: Secondary | ICD-10-CM | POA: Diagnosis not present

## 2024-07-03 DIAGNOSIS — I739 Peripheral vascular disease, unspecified: Secondary | ICD-10-CM | POA: Diagnosis not present

## 2024-07-03 DIAGNOSIS — I10 Essential (primary) hypertension: Secondary | ICD-10-CM | POA: Diagnosis not present

## 2024-07-03 DIAGNOSIS — I350 Nonrheumatic aortic (valve) stenosis: Secondary | ICD-10-CM | POA: Diagnosis not present

## 2024-07-03 DIAGNOSIS — E782 Mixed hyperlipidemia: Secondary | ICD-10-CM | POA: Diagnosis not present

## 2024-07-03 DIAGNOSIS — J449 Chronic obstructive pulmonary disease, unspecified: Secondary | ICD-10-CM | POA: Diagnosis not present

## 2024-07-08 ENCOUNTER — Other Ambulatory Visit: Payer: Self-pay

## 2024-07-08 DIAGNOSIS — I739 Peripheral vascular disease, unspecified: Secondary | ICD-10-CM

## 2024-07-11 DIAGNOSIS — I10 Essential (primary) hypertension: Secondary | ICD-10-CM | POA: Diagnosis not present

## 2024-07-11 DIAGNOSIS — I350 Nonrheumatic aortic (valve) stenosis: Secondary | ICD-10-CM | POA: Diagnosis not present

## 2024-07-11 DIAGNOSIS — I739 Peripheral vascular disease, unspecified: Secondary | ICD-10-CM | POA: Diagnosis not present

## 2024-07-11 DIAGNOSIS — J449 Chronic obstructive pulmonary disease, unspecified: Secondary | ICD-10-CM | POA: Diagnosis not present

## 2024-08-01 ENCOUNTER — Encounter (INDEPENDENT_AMBULATORY_CARE_PROVIDER_SITE_OTHER): Payer: Medicare Other | Admitting: Ophthalmology

## 2024-08-01 NOTE — Progress Notes (Signed)
 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  HPI:  This is a 83 y.o. female who is s/p aortobifemoral bypass, aortic endarterectomy; lysis of adhesions, ligation of inferior mesenteric vein and bilateral common femoral endarterectomy by Dr. Sheree and Dr. Lanis on 12/03/21. She did well post operatively and was discharged POD#7.   Postoperatively, she had acute pancreatitis which she has recovered from.  Earlier this year, ABI's were depressed in the left lower extremity necessitating CT angio abdomen pelvis.  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  The profunda was widely patent, and she was asymptomatic, therefore we elected to continue medical management.  On exam today, Elizabeth [name] was doing well.  She had no complaints.  States that she feels like she has slowed up significantly over the last year.  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  Denies symptoms of claudication. She continues to smoke.    Allergies  Allergen Reactions   Alendronate Sodium Other (See Comments)    loose teeth Fosamax*   Azithromycin  Diarrhea and Nausea And Vomiting   Chantix [Varenicline]     Other reaction(s): N/V/D and flu sxs   Other Other (See Comments)    Chlorexolone - unknown reaction   Pollen Extract     Allergic to tree pollens   Tegretol [Carbamazepine] Swelling   Lisinopril Rash   Tetracyclines & Related Rash    Current Outpatient Medications  Medication Sig Dispense Refill   amLODipine  (NORVASC ) 10 MG tablet Take 10 mg by mouth daily.     aspirin  EC 81 MG tablet Take 81 mg by mouth at bedtime. Swallow whole.     cholecalciferol (VITAMIN D3) 25 MCG (1000 UNIT) tablet Take 1,000 Units by mouth daily.     cloNIDine  (CATAPRES ) 0.1 MG tablet Take 1 tablet (0.1 mg total) by mouth daily. 90 tablet 1   Coenzyme Q10  100 MG capsule Take 100 mg by mouth every morning.     cyanocobalamin  (VITAMIN B12) 1000 MCG tablet Take 1,000 mcg by mouth daily.     docusate sodium  (COLACE) 100 MG capsule Take 1 capsule (100 mg total) by mouth 2 (two) times daily. 30 capsule 0   furosemide  (LASIX ) 20 MG tablet Take 20 mg by mouth daily.     INCRUSE ELLIPTA  62.5 MCG/INH AEPB Inhale 1 puff into the lungs daily. 30 each 1   metoprolol  succinate (TOPROL -XL) 25 MG 24 hr tablet Take 25 mg by mouth at bedtime.     Multiple Vitamin (MULTIVITAMIN WITH MINERALS) TABS tablet Take 1 tablet by mouth 2 (two) times daily.     rosuvastatin  (CRESTOR ) 40 MG tablet Take 40 mg by mouth every evening.     TURMERIC CURCUMIN PO Take 1 capsule by mouth daily. With Ginger     VITAMIN E PO Take 1 capsule by mouth daily.     No current facility-administered medications for this visit.     ROS:  See HPI  Physical Exam:  There were no vitals filed for this visit.  Deferred as this was a phone call visit  Non invasive vascular lab: See studies   Assessment/Plan:  This is a 83 y.o. female who is s/p:aortobifemoral bypass, aortic endarterectomy; lysis of adhesions, ligation of inferior mesenteric vein and bilateral common femoral endarterectomy by Dr. Lanis and Dr. Sheree on 12/03/21.  ABIs unchanged over  the last 12 months.  She remains asymptomatic.  My plan is to see her on a yearly basis with repeat ABI.    We discussed smoking cessation, however Elizabeth [name] is not interested.  I asked that she continue her aspirin  and statin therapy.  And call should any questions or concerns arise.   Fonda FORBES [facility] [phone]

## 2024-08-02 DIAGNOSIS — I10 Essential (primary) hypertension: Secondary | ICD-10-CM | POA: Diagnosis not present

## 2024-08-02 DIAGNOSIS — J449 Chronic obstructive pulmonary disease, unspecified: Secondary | ICD-10-CM | POA: Diagnosis not present

## 2024-08-02 DIAGNOSIS — E782 Mixed hyperlipidemia: Secondary | ICD-10-CM | POA: Diagnosis not present

## 2024-08-02 DIAGNOSIS — I739 Peripheral vascular disease, unspecified: Secondary | ICD-10-CM | POA: Diagnosis not present

## 2024-08-02 DIAGNOSIS — I350 Nonrheumatic aortic (valve) stenosis: Secondary | ICD-10-CM | POA: Diagnosis not present

## 2024-08-04 ENCOUNTER — Ambulatory Visit (HOSPITAL_COMMUNITY)
Admission: RE | Admit: 2024-08-04 | Discharge: 2024-08-04 | Disposition: A | Discharge status: Home / Self Care | Source: Ambulatory Visit | Attending: Vascular Surgery | Admitting: Vascular Surgery

## 2024-08-04 ENCOUNTER — Encounter: Payer: Self-pay | Admitting: Vascular Surgery

## 2024-08-04 ENCOUNTER — Ambulatory Visit (INDEPENDENT_AMBULATORY_CARE_PROVIDER_SITE_OTHER): Admitting: Vascular Surgery

## 2024-08-04 VITALS — BP 86/59 | HR 77 | Temp 97.7°F | Resp 20 | Ht 61.0 in | Wt 100.0 lb

## 2024-08-04 DIAGNOSIS — I739 Peripheral vascular disease, unspecified: Secondary | ICD-10-CM | POA: Insufficient documentation

## 2024-08-04 DIAGNOSIS — Z95828 Presence of other vascular implants and grafts: Secondary | ICD-10-CM | POA: Insufficient documentation

## 2024-08-04 LAB — VAS US ABI WITH/WO TBI
Left ABI: 0.47
Right ABI: 1.01

## 2024-08-05 ENCOUNTER — Other Ambulatory Visit: Payer: Self-pay | Admitting: Vascular Surgery

## 2024-08-05 DIAGNOSIS — I739 Peripheral vascular disease, unspecified: Secondary | ICD-10-CM

## 2024-08-10 DIAGNOSIS — I350 Nonrheumatic aortic (valve) stenosis: Secondary | ICD-10-CM | POA: Diagnosis not present

## 2024-08-10 DIAGNOSIS — I739 Peripheral vascular disease, unspecified: Secondary | ICD-10-CM | POA: Diagnosis not present

## 2024-08-10 DIAGNOSIS — I10 Essential (primary) hypertension: Secondary | ICD-10-CM | POA: Diagnosis not present

## 2024-08-10 DIAGNOSIS — J449 Chronic obstructive pulmonary disease, unspecified: Secondary | ICD-10-CM | POA: Diagnosis not present

## 2024-08-17 ENCOUNTER — Ambulatory Visit: Admitting: Gastroenterology

## 2024-08-19 ENCOUNTER — Encounter: Payer: Self-pay | Admitting: Gastroenterology

## 2024-08-19 ENCOUNTER — Other Ambulatory Visit

## 2024-08-19 ENCOUNTER — Ambulatory Visit (INDEPENDENT_AMBULATORY_CARE_PROVIDER_SITE_OTHER): Admitting: Gastroenterology

## 2024-08-19 VITALS — BP 124/46 | HR 76 | Ht 59.25 in | Wt 99.2 lb

## 2024-08-19 DIAGNOSIS — Z8719 Personal history of other diseases of the digestive system: Secondary | ICD-10-CM | POA: Diagnosis not present

## 2024-08-19 DIAGNOSIS — Q453 Other congenital malformations of pancreas and pancreatic duct: Secondary | ICD-10-CM | POA: Diagnosis not present

## 2024-08-19 DIAGNOSIS — R1114 Bilious vomiting: Secondary | ICD-10-CM | POA: Diagnosis not present

## 2024-08-19 DIAGNOSIS — R194 Change in bowel habit: Secondary | ICD-10-CM | POA: Insufficient documentation

## 2024-08-19 NOTE — Patient Instructions (Signed)
 Your provider has requested that you go to the basement level for lab work before leaving today. Press B on the elevator. The lab is located at the first door on the left as you exit the elevator.  _______________________________________________________  If your blood pressure at your visit was 140/90 or greater, please contact your primary care physician to follow up on this.  _______________________________________________________  If you are age 84 or older, your body mass index should be between 23-30. Your Body mass index is 19.88 kg/m. If this is out of the aforementioned range listed, please consider follow up with your Primary Care Provider.  If you are age 70 or younger, your body mass index should be between 19-25. Your Body mass index is 19.88 kg/m. If this is out of the aformentioned range listed, please consider follow up with your Primary Care Provider.   ________________________________________________________  The [facility] providers would like to encourage you to use MYCHART to communicate with providers for non-urgent requests or questions.  Due to long hold times on the telephone, sending your provider a message by [facility] may be a faster and more efficient way to get a response.  Please allow 48 business hours for a response.  Please remember that this is for non-urgent requests.  _______________________________________________________  [facility] is using a team-based approach to care.  Your team is made up of your doctor and two to three APPS. Our APPS (Nurse Practitioners and Physician Assistants) work with your physician to ensure care continuity for you. They are fully qualified to address your health concerns and develop a treatment plan. They communicate directly with your gastroenterologist to care for you. Seeing the Advanced Practice Practitioners on your physician's team can help you by facilitating care more promptly, often allowing for earlier  appointments, access to diagnostic testing, procedures, and other specialty referrals.

## 2024-08-19 NOTE — Progress Notes (Signed)
 GASTROENTEROLOGY OUTPATIENT CLINIC VISIT   Primary Care Provider Dwight Trula SQUIBB, MD [address] [place] [address] [phone]  Referring Provider Dr. Stacia  Patient Profile: Elizabeth [name] is a 83 y.o. 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  The patient presents to the [facility] for an evaluation and management of problem(s) noted below:  Problem List 1. History of pancreatitis   2. Pancreatic divisum   3. Change in bowel habits   4. Bilious vomiting with nausea    Discussed the use of AI scribe software for clinical note transcription with the patient, who gave verbal consent to proceed.  History of Present Illness Please see prior notes for full details of HPI.  Interval History Elizabeth [name] is an 83 year old female with known pancreatic divisum who presents after recent surgery and another bout of recurrent pancreatitis.  She has a history of pancreatic divisum.  When I saw her in consult a few years ago we discussed role of ERCP and held off, since her 2 episodes of pancreatitis were really only noted later in life and associated with recent surgery.  She underwent another intervention this year and within a short period of time came in with recurrent pancreatitis.  Imaging is noted below but confirms divisum again and there is some dilation of the pancreatic duct.  She is feeling well currently from a pancreatitis standpoint and is pain free.  However, she describes issues of nausea and intermittent diarrhea.  These episodes occur every two to three weeks.  No blood in her stools.  She has not had stool testing completed.  She did have signs on the last EUS for  concern of possible developing chronic pancreatitis.  She has expressed concerns about undergoing any procedures unless absolutely necessary due to her age and overall health condition.   GI Review of Systems Positive as above Negative for dysphagia, pain, melena, hematochezia  Review of Systems General: Denies fevers/chills Cardiovascular: Denies chest pain Pulmonary: Denies progressive shortness of breath Gastroenterological: See HPI Genitourinary: Denies darkened urine or hematuria Hematological: Positive for history of easy bruising/bleeding Dermatological: Denies jaundice Psychological: Mood is stable   Medications Current Outpatient Medications  Medication Sig Dispense Refill   amLODipine  (NORVASC ) 10 MG tablet Take 10 mg by mouth daily.     aspirin  EC 81 MG tablet Take 81 mg by mouth at bedtime. Swallow whole.     cholecalciferol (VITAMIN D3) 25 MCG (1000 UNIT) tablet Take 1,000 Units by mouth daily.     cloNIDine  (CATAPRES ) 0.1 MG tablet Take 1 tablet (0.1 mg total) by mouth daily. 90 tablet 1   Coenzyme Q10 100 MG capsule Take 100 mg by mouth every morning.     cyanocobalamin  (VITAMIN B12) 1000 MCG tablet Take 1,000 mcg by mouth daily.     docusate sodium  (COLACE) 100 MG capsule Take 1 capsule (100 mg total) by mouth 2 (two) times daily. 30 capsule 0   furosemide  (LASIX ) 20 MG tablet Take 20 mg by mouth daily.     INCRUSE ELLIPTA  62.5 MCG/INH AEPB Inhale 1 puff into the lungs daily. 30 each 1   metoprolol  succinate (TOPROL -XL) 25 MG 24 hr tablet Take 25 mg by mouth at bedtime.     Multiple Vitamin (MULTIVITAMIN WITH MINERALS) TABS tablet Take  1 tablet by mouth 2 (two) times daily.     ondansetron  (ZOFRAN -ODT) 4 MG disintegrating tablet 1 tablet on the tongue and allow to dissolve as needed for nausea or vomiting Orally every 8 hours; Duration: 14 days     rosuvastatin  (CRESTOR ) 40 MG tablet Take 40 mg by mouth every evening.     TURMERIC CURCUMIN PO Take 1 capsule by mouth  daily. With Ginger     VITAMIN E PO Take 1 capsule by mouth daily.     No current facility-administered medications for this visit.    Allergies Allergies  Allergen Reactions   Alendronate Sodium Other (See Comments)    loose teeth Fosamax*   Azithromycin  Diarrhea and Nausea And Vomiting   Chantix [Varenicline]     Other reaction(s): N/V/D and flu sxs   Other Other (See Comments)    Chlorexolone - unknown reaction   Pollen Extract     Allergic to tree pollens   Tegretol [Carbamazepine] Swelling   Lisinopril Rash   Tetracyclines & Related Rash    Histories Past Medical History:  Diagnosis Date   Abdominal cramping    possibly irritable bowel syndrome versus adhesions secondary to multiple surgeries and abdominal inflammation and 2009 secondary to diverticulitis and diverticular phlegmon   Allergic rhinitis    Arthritis    Carotid artery disease    50-69% RICA, < 50% LICA 10/10/20 US  ([facility])   Cataract    Chest pain syndrome 2004   with adenosine Cardiolite  that was normal   Chronic hoarseness    COPD (chronic obstructive pulmonary disease) (HCC)    Dyspnea    GERD (gastroesophageal reflux disease)    Heart murmur    Mild AS, AI. AV pk grad 18.79 mmHg, mn grad 11.20 mmHg, AVA (VTI) 1.05 cm2 10/10/20, 3 year f/u rec ([facility])   History of kidney stones    History of renal calculi    Hypercholesterolemia    Hypertension    Incontinence    Cough incontinence   Mild depression    Neuromuscular disorder (HCC)    neuropathy bilateral lower extremities   Osteopenia    Peripheral vascular disease    with femoral and carotid bruits   PONV (postoperative nausea and vomiting)    Recurrent pancreatitis    Tobacco dependence    Vitamin D deficiency    Past Surgical History:  Procedure Laterality Date   ABDOMINAL AORTOGRAM W/LOWER EXTREMITY N/A 09/18/2021   Procedure: ABDOMINAL AORTOGRAM W/LOWER EXTREMITY;  Surgeon: Lanis Fonda BRAVO, MD;  Location: [facility] INVASIVE CV LAB;   Service: Cardiovascular;  Laterality: N/A;   AORTA - BILATERAL FEMORAL ARTERY BYPASS GRAFT Bilateral 12/03/2021   Procedure: AORTOBIFEMORAL BYPASS GRAFT USING A 14 X 7mm HEMASHIELD GOLD GRAFT;  Surgeon: Lanis Fonda BRAVO, MD;  Location: [facility] OR;  Service: Vascular;  Laterality: Bilateral;   APPENDECTOMY     BIOPSY  02/24/2022   Procedure: BIOPSY;  Surgeon: Wilhelmenia Aloha Raddle., MD;  Location: [facility];  Service: Gastroenterology;;   BREAST BIOPSY     CATARACT EXTRACTION, BILATERAL     COLON SURGERY     2009   COSMETIC SURGERY     on neck and eyes   DILATION AND CURETTAGE OF UTERUS     ESOPHAGOGASTRODUODENOSCOPY (EGD) WITH PROPOFOL  N/A 02/24/2022   Procedure: ESOPHAGOGASTRODUODENOSCOPY (EGD) WITH PROPOFOL ;  Surgeon: Wilhelmenia Aloha Raddle., MD;  Location: [facility];  Service: Gastroenterology;  Laterality: N/A;   EUS N/A 02/24/2022   Procedure: UPPER ENDOSCOPIC ULTRASOUND (  EUS) RADIAL;  Surgeon: Wilhelmenia Aloha Raddle., MD;  Location: [facility];  Service: Gastroenterology;  Laterality: N/A;   EYE SURGERY     2014   FEMUR SURGERY  12/2007   left femur surgery--for femoral neck fracture status post fall    FRACTURE SURGERY     broke left leg and left arm- 2012   HOT HEMOSTASIS N/A 02/24/2022   Procedure: HOT HEMOSTASIS (ARGON PLASMA COAGULATION/BICAP);  Surgeon: Wilhelmenia Aloha Raddle., MD;  Location: [facility];  Service: Gastroenterology;  Laterality: N/A;   LIPOSUCTION     LUMBAR LAMINECTOMY/DECOMPRESSION MICRODISCECTOMY N/A 11/12/2020   Procedure: LAMINECTOMY AND FORAMINOTOMY Lumbar three four, Lumbar four five, Lumbar Five Sacral one;  Surgeon: Mavis Purchase, MD;  Location: [facility] OR;  Service: Neurosurgery;  Laterality: N/A;   LUMBAR LAMINECTOMY/DECOMPRESSION MICRODISCECTOMY Left 06/01/2024   Procedure: LEFT LUMBAR THREE-FOUR, LUMBAR FOUR-FIVE MICRODISCECTOMY;  Surgeon: Mavis Purchase, MD;  Location: [facility] OR;  Service: Neurosurgery;  Laterality: Left;  , LT, L34, L45   OTHER  SURGICAL HISTORY     sigmoid colectomy   SHOULDER SURGERY     arthroscopic shoulder surgery    TONSILLECTOMY     TUBAL LIGATION     Social History   Socioeconomic History   Marital status: Widowed    Spouse name: Not on file   Number of children: Not on file   Years of education: Not on file   Highest education level: Not on file  Occupational History   Not on file  Tobacco Use   Smoking status: Every Day    Current packs/day: 0.50    Average packs/day: 0.5 packs/day for 60.0 years (30.0 ttl pk-yrs)    Types: Cigarettes    Passive exposure: Never   Smokeless tobacco: Never  Vaping Use   Vaping status: Never Used  Substance and Sexual Activity   Alcohol use: Yes    Alcohol/week: 0.0 standard drinks of alcohol    Comment: Occ glass of wine   Drug use: No   Sexual activity: Not on file  Other Topics Concern   Not on file  Social History Narrative   Not on file   Social Drivers of Health   Financial Resource Strain: Not on file  Food Insecurity: No Food Insecurity (06/15/2024)   Hunger Vital Sign    Worried About Running Out of Food in the Last Year: Never true    Ran Out of Food in the Last Year: Never true  Transportation Needs: No Transportation Needs (06/15/2024)   PRAPARE - Administrator, Civil Service (Medical): No    Lack of Transportation (Non-Medical): No  Physical Activity: Not on file  Stress: Not on file  Social Connections: Moderately Integrated (06/15/2024)   Social Connection and Isolation Panel    Frequency of Communication with Friends and Family: More than three times a week    Frequency of Social Gatherings with Friends and Family: More than three times a week    Attends Religious Services: More than 4 times per year    Active Member of [employer] or Organizations: Yes    Attends Banker Meetings: More than 4 times per year    Marital Status: Widowed  Intimate Partner Violence: Not At Risk (06/15/2024)   Humiliation, Afraid,  Rape, and Kick questionnaire    Fear of Current or Ex-Partner: No    Emotionally Abused: No    Physically Abused: No    Sexually Abused: No   Family History  Problem Relation Age  of Onset   Heart disease Mother    Stroke Mother    Colon cancer Neg Hx    Esophageal cancer Neg Hx    Stomach cancer Neg Hx    Inflammatory bowel disease Neg Hx    Liver disease Neg Hx    Pancreatic cancer Neg Hx    Rectal cancer Neg Hx    I have reviewed her medical, social, and family history in detail and updated the electronic medical record as necessary.    PHYSICAL EXAMINATION  BP (!) 124/46 (BP Location: Right Arm, Patient Position: Sitting, Cuff Size: Normal)   Pulse 76   Ht 4' 11.25 (1.505 m) Comment: height measured without shoes  Wt 99 lb 4 oz (45 kg)   BMI 19.88 kg/m  Wt Readings from Last 3 Encounters:  08/19/24 99 lb 4 oz (45 kg)  08/04/24 100 lb (45.4 kg)  06/15/24 106 lb 3.1 oz (48.2 kg)  GEN: NAD, appears stated age, doesn't appear chronically ill, accompanied by family PSYCH: Cooperative, without pressured speech EYE: Conjunctivae pink, sclerae anicteric ENT: MMM CV: Nontachycardic RESP: No audible wheezing GI: NABS, soft, NT/ND, without rebound or guarding MSK/EXT: No lower extremity edema SKIN: No jaundice NEURO:  Alert & Oriented x 3, no focal deficits   REVIEW OF DATA  I reviewed the following data at the time of this encounter:  GI Procedures and Studies  Today we reviewed the April 2023 upper EUS EGD Impression: - No gross lesions in esophagus. Z-line irregular, 38 cm from the incisors. - 3 cm hiatal hernia. - A single bleeding angioectasia in the stomach. Treated with argon plasma coagulation (APC). - Erythematous mucosa in the stomach. Biopsied. - No gross lesions in the duodenal bulb, in the first portion of the duodenum and in the second portion of the duodenum. - Small minor papilla. - Flat major papilla. EUS Impression: - Pancreatic divisum is noted.  The pancreatic duct had a dilated endosonographic appearance in the pancreatic head traversing into the genu of the pancreas. No overt stricturing or mass/lesion was noted. The body of the pancreas and tail of the pancreas ducts were dilated somewhat. - The pancreatic duct had prominent side-branches on endosonographic appearance in the body of the pancreas and tail of the pancreas. - Pancreatic parenchymal abnormalities consisting of lobularity and hyperechoic strands were noted in the entire pancreas. - There was no sign of significant pathology in the common bile duct and in the common hepatic duct. - A retroperitoneal fluid collection was present. Based on previous imaging from 2/23, this looks to be smaller on today's EUS examination. It is heterogenous. It is not clear if this is a true post-operative fluid collection as had been alluded to or a peripancreatic fluid collection with necrosis (though the rest of the pancreas looking as it does makes that seem less likely). This could be sampled if necessary, but not clear that it needs to - No malignant-appearing lymph nodes were visualized in the celiac region (level 20), peripancreatic region and porta hepatis region.  Pathology FINAL MICROSCOPIC DIAGNOSIS:  A. STOMACH, BIOPSY:  - Antral and oxyntic mucosa with hyperemia.  - No Helicobacter pylori identified.   Laboratory Studies  Reviewed those in epic  Imaging Studies  August 2025 MRI/MRCP IMPRESSION: 1. 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 Bilateral retroperitoneal soft tissue stranding and edema which may be a manifestation of acute pancreatitis.No pseudocyst formation. 2. Moderate gallbladder distention without wall thickening,  sludge, or gallstones. Fusiform dilatation of the common bile duct (up to 7 mm) and mild intrahepatic bile duct dilatation, unchanged from prior exam. No stones identified. 3. Multiple tiny  liver cysts, up to 6 mm. No suspicious enhancing liver lesion identified. 4. Postoperative changes at L4-5 level consistent with recent microdiscectomy, including diffuse epidural enhancement and seroma formation (1.9 x 1.0 cm) without mass effect.   ASSESSMENT/PLAN  Ms. Wassmer is a 83 y.o. female.  The patient is seen today for evaluation and management of:  1. History of pancreatitis   2. Pancreatic divisum   3. Change in bowel habits   4. Bilious vomiting with nausea    The patient is hemodynamically stable.   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  Only episodes of pancreatitis in her life are post-surgery.  Query, if narcotics could be causing spasms that may increase risk in her anatomy to cause her episodes, or if this is truly her presentation and history.  In any case, I think there is reason to consider ERCP of the pancreas to try and access the minor papilla and duct with her ductal dilation.  It will carry risks of tear/bleeding/infection/aspiration/medication effects and 20-25% risk of pancreatitis (successful or not).  She prefers to avoid risks at this time.  We will need to consider this in future. - Hold on ERCP for now - If recurring episodes consider role of ERCP    Intermittent diarrhea and vomiting Intermittent diarrhea and vomiting may be related to possible chronic pancreatitis (noted on last EUS) and exocrine insufficiency.  Could just end up being functional diarrhea or IBS as well.  She needs to discuss this further with her primary GI, however.  I think reasonable to rule out EPI. - Order fecal elastase stool test to evaluate enzyme levels.  - Order fecal fat test to assess fat malabsorption.  - Further workup as per primary GI    Orders Placed This Encounter  Procedures   Pancreatic elastase, fecal   Fecal fat, qualitative     New Prescriptions   No medications on file   Modified Medications   No medications on file    Planned Follow Up Return if symptoms worsen or fail to improve.   Total Time in Face-to-Face and in Coordination of Care for patient including independent/personal interpretation/review of prior testing, medical history, examination, medication adjustment, communicating results with the patient directly, and documentation within the EHR is 30 minutes.   Aloha Finner, MD [facility] Office # [phone]

## 2024-09-02 DIAGNOSIS — E782 Mixed hyperlipidemia: Secondary | ICD-10-CM | POA: Diagnosis not present

## 2024-09-02 DIAGNOSIS — I350 Nonrheumatic aortic (valve) stenosis: Secondary | ICD-10-CM | POA: Diagnosis not present

## 2024-09-02 DIAGNOSIS — I739 Peripheral vascular disease, unspecified: Secondary | ICD-10-CM | POA: Diagnosis not present

## 2024-09-02 DIAGNOSIS — I10 Essential (primary) hypertension: Secondary | ICD-10-CM | POA: Diagnosis not present

## 2024-09-02 DIAGNOSIS — J449 Chronic obstructive pulmonary disease, unspecified: Secondary | ICD-10-CM | POA: Diagnosis not present

## 2024-09-07 ENCOUNTER — Other Ambulatory Visit

## 2024-09-07 DIAGNOSIS — Q453 Other congenital malformations of pancreas and pancreatic duct: Secondary | ICD-10-CM | POA: Diagnosis not present

## 2024-09-07 DIAGNOSIS — Z8719 Personal history of other diseases of the digestive system: Secondary | ICD-10-CM

## 2024-09-07 DIAGNOSIS — R194 Change in bowel habit: Secondary | ICD-10-CM | POA: Diagnosis not present

## 2024-09-07 DIAGNOSIS — R1114 Bilious vomiting: Secondary | ICD-10-CM | POA: Diagnosis not present

## 2024-09-09 ENCOUNTER — Ambulatory Visit: Payer: Self-pay | Admitting: Gastroenterology

## 2024-09-09 DIAGNOSIS — I10 Essential (primary) hypertension: Secondary | ICD-10-CM | POA: Diagnosis not present

## 2024-09-09 DIAGNOSIS — J449 Chronic obstructive pulmonary disease, unspecified: Secondary | ICD-10-CM | POA: Diagnosis not present

## 2024-09-09 DIAGNOSIS — I350 Nonrheumatic aortic (valve) stenosis: Secondary | ICD-10-CM | POA: Diagnosis not present

## 2024-09-09 DIAGNOSIS — I739 Peripheral vascular disease, unspecified: Secondary | ICD-10-CM | POA: Diagnosis not present

## 2024-09-09 LAB — FECAL FAT, QUALITATIVE
Fat Qual Neutral, Stl: NORMAL
Fat Qual Total, Stl: NORMAL

## 2024-09-10 LAB — PANCREATIC ELASTASE, FECAL: Pancreatic Elastase, Fecal: 220 ug Elast./g (ref >200)

## 2024-10-02 DIAGNOSIS — E782 Mixed hyperlipidemia: Secondary | ICD-10-CM | POA: Diagnosis not present

## 2024-10-02 DIAGNOSIS — I350 Nonrheumatic aortic (valve) stenosis: Secondary | ICD-10-CM | POA: Diagnosis not present

## 2024-10-02 DIAGNOSIS — J449 Chronic obstructive pulmonary disease, unspecified: Secondary | ICD-10-CM | POA: Diagnosis not present

## 2024-10-02 DIAGNOSIS — I10 Essential (primary) hypertension: Secondary | ICD-10-CM | POA: Diagnosis not present

## 2024-10-02 DIAGNOSIS — I739 Peripheral vascular disease, unspecified: Secondary | ICD-10-CM | POA: Diagnosis not present

## 2024-10-11 DIAGNOSIS — M5126 Other intervertebral disc displacement, lumbar region: Secondary | ICD-10-CM | POA: Diagnosis not present

## 2024-10-13 DIAGNOSIS — L708 Other acne: Secondary | ICD-10-CM | POA: Diagnosis not present

## 2024-10-13 DIAGNOSIS — Z1331 Encounter for screening for depression: Secondary | ICD-10-CM | POA: Diagnosis not present

## 2024-10-13 DIAGNOSIS — Z0000 Encounter for general adult medical examination without abnormal findings: Secondary | ICD-10-CM | POA: Diagnosis not present

## 2024-10-13 DIAGNOSIS — J449 Chronic obstructive pulmonary disease, unspecified: Secondary | ICD-10-CM | POA: Diagnosis not present

## 2024-10-13 DIAGNOSIS — I350 Nonrheumatic aortic (valve) stenosis: Secondary | ICD-10-CM | POA: Diagnosis not present

## 2024-10-13 DIAGNOSIS — M51369 Other intervertebral disc degeneration, lumbar region without mention of lumbar back pain or lower extremity pain: Secondary | ICD-10-CM | POA: Diagnosis not present

## 2024-10-13 DIAGNOSIS — Q453 Other congenital malformations of pancreas and pancreatic duct: Secondary | ICD-10-CM | POA: Diagnosis not present

## 2024-10-13 DIAGNOSIS — I10 Essential (primary) hypertension: Secondary | ICD-10-CM | POA: Diagnosis not present

## 2024-10-13 DIAGNOSIS — M810 Age-related osteoporosis without current pathological fracture: Secondary | ICD-10-CM | POA: Diagnosis not present

## 2024-10-13 DIAGNOSIS — M792 Neuralgia and neuritis, unspecified: Secondary | ICD-10-CM | POA: Diagnosis not present

## 2024-10-13 DIAGNOSIS — Z23 Encounter for immunization: Secondary | ICD-10-CM | POA: Diagnosis not present

## 2024-10-13 DIAGNOSIS — E782 Mixed hyperlipidemia: Secondary | ICD-10-CM | POA: Diagnosis not present

## 2024-10-13 DIAGNOSIS — R29898 Other symptoms and signs involving the musculoskeletal system: Secondary | ICD-10-CM | POA: Diagnosis not present

## 2024-10-13 DIAGNOSIS — I739 Peripheral vascular disease, unspecified: Secondary | ICD-10-CM | POA: Diagnosis not present

## 2024-10-13 DIAGNOSIS — F17210 Nicotine dependence, cigarettes, uncomplicated: Secondary | ICD-10-CM | POA: Diagnosis not present

## 2024-11-09 ENCOUNTER — Encounter: Payer: Self-pay | Admitting: Cardiovascular Disease
